# Patient Record
Sex: Female | Born: 1935
Health system: Southern US, Community
[De-identification: ages and names within clinical notes are randomized; demographics above are authoritative.]

## PROBLEM LIST (undated history)

## (undated) DIAGNOSIS — R079 Chest pain, unspecified: Secondary | ICD-10-CM

## (undated) DIAGNOSIS — Z803 Family history of malignant neoplasm of breast: Secondary | ICD-10-CM

## (undated) DIAGNOSIS — R55 Syncope and collapse: Secondary | ICD-10-CM

## (undated) DIAGNOSIS — G9389 Other specified disorders of brain: Secondary | ICD-10-CM

## (undated) DIAGNOSIS — M545 Low back pain: Secondary | ICD-10-CM

## (undated) DIAGNOSIS — R269 Unspecified abnormalities of gait and mobility: Principal | ICD-10-CM

## (undated) DIAGNOSIS — E039 Hypothyroidism, unspecified: Secondary | ICD-10-CM

## (undated) DIAGNOSIS — E559 Vitamin D deficiency, unspecified: Secondary | ICD-10-CM

## (undated) DIAGNOSIS — K439 Ventral hernia without obstruction or gangrene: Secondary | ICD-10-CM

## (undated) DIAGNOSIS — G319 Degenerative disease of nervous system, unspecified: Secondary | ICD-10-CM

## (undated) DIAGNOSIS — H9190 Unspecified hearing loss, unspecified ear: Secondary | ICD-10-CM

## (undated) DIAGNOSIS — K5900 Constipation, unspecified: Secondary | ICD-10-CM

## (undated) DIAGNOSIS — S1093XA Contusion of unspecified part of neck, initial encounter: Secondary | ICD-10-CM

## (undated) DIAGNOSIS — L84 Corns and callosities: Secondary | ICD-10-CM

## (undated) DIAGNOSIS — S0003XA Contusion of scalp, initial encounter: Secondary | ICD-10-CM

## (undated) DIAGNOSIS — E785 Hyperlipidemia, unspecified: Secondary | ICD-10-CM

## (undated) DIAGNOSIS — S8010XA Contusion of unspecified lower leg, initial encounter: Secondary | ICD-10-CM

## (undated) DIAGNOSIS — S0083XA Contusion of other part of head, initial encounter: Secondary | ICD-10-CM

## (undated) DIAGNOSIS — Z9181 History of falling: Secondary | ICD-10-CM

## (undated) DIAGNOSIS — I6789 Other cerebrovascular disease: Secondary | ICD-10-CM

## (undated) DIAGNOSIS — R350 Frequency of micturition: Secondary | ICD-10-CM

## (undated) DIAGNOSIS — N390 Urinary tract infection, site not specified: Secondary | ICD-10-CM

## (undated) DIAGNOSIS — M25569 Pain in unspecified knee: Secondary | ICD-10-CM

## (undated) DIAGNOSIS — R609 Edema, unspecified: Secondary | ICD-10-CM

## (undated) DIAGNOSIS — K429 Umbilical hernia without obstruction or gangrene: Secondary | ICD-10-CM

## (undated) DIAGNOSIS — M810 Age-related osteoporosis without current pathological fracture: Secondary | ICD-10-CM

## (undated) DIAGNOSIS — M1990 Unspecified osteoarthritis, unspecified site: Secondary | ICD-10-CM

## (undated) HISTORY — DX: Chest pain, unspecified: R07.9

## (undated) HISTORY — DX: Other specified disorders of brain: G93.89

## (undated) HISTORY — DX: Unspecified abnormalities of gait and mobility: R26.9

## (undated) HISTORY — DX: Contusion of unspecified part of neck, initial encounter: S10.93XA

## (undated) HISTORY — DX: Urinary tract infection, site not specified: N39.0

## (undated) HISTORY — DX: Ventral hernia without obstruction or gangrene: K43.9

## (undated) HISTORY — DX: Edema, unspecified: R60.9

## (undated) HISTORY — DX: Corns and callosities: L84

## (undated) HISTORY — DX: Constipation, unspecified: K59.00

## (undated) HISTORY — DX: Unspecified hearing loss, unspecified ear: H91.90

## (undated) HISTORY — DX: Contusion of other part of head, initial encounter: S00.83XA

## (undated) HISTORY — DX: Hypothyroidism, unspecified: E03.9

## (undated) HISTORY — DX: Contusion of scalp, initial encounter: S00.03XA

## (undated) HISTORY — DX: Family history of malignant neoplasm of breast: Z80.3

## (undated) HISTORY — DX: Frequency of micturition: R35.0

## (undated) HISTORY — DX: Degenerative disease of nervous system, unspecified: G31.9

## (undated) HISTORY — DX: Vitamin D deficiency, unspecified: E55.9

## (undated) HISTORY — DX: Hyperlipidemia, unspecified: E78.5

## (undated) HISTORY — DX: Low back pain: M54.5

## (undated) HISTORY — DX: Unspecified osteoarthritis, unspecified site: M19.90

## (undated) HISTORY — DX: Age-related osteoporosis without current pathological fracture: M81.0

## (undated) HISTORY — DX: Contusion of unspecified lower leg, initial encounter: S80.10XA

## (undated) HISTORY — DX: Pain in unspecified knee: M25.569

## (undated) HISTORY — DX: History of falling: Z91.81

## (undated) HISTORY — DX: Syncope and collapse: R55

## (undated) HISTORY — DX: Other cerebrovascular disease: I67.89

## (undated) HISTORY — DX: Umbilical hernia without obstruction or gangrene: K42.9

---

## 1984-01-28 HISTORY — PX: TOE AMPUTATION: SHX809

## 1997-11-07 ENCOUNTER — Emergency Department (HOSPITAL_COMMUNITY): Admission: EM | Admit: 1997-11-07 | Discharge: 1997-11-07 | Payer: Self-pay | Admitting: Emergency Medicine

## 1997-11-10 ENCOUNTER — Encounter: Admission: RE | Admit: 1997-11-10 | Discharge: 1997-11-10 | Payer: Self-pay | Admitting: Internal Medicine

## 1997-11-16 ENCOUNTER — Ambulatory Visit (HOSPITAL_COMMUNITY): Admission: RE | Admit: 1997-11-16 | Discharge: 1997-11-16 | Payer: Self-pay | Admitting: *Deleted

## 1998-03-08 ENCOUNTER — Ambulatory Visit (HOSPITAL_BASED_OUTPATIENT_CLINIC_OR_DEPARTMENT_OTHER): Admission: RE | Admit: 1998-03-08 | Discharge: 1998-03-08 | Payer: Self-pay | Admitting: Surgery

## 1998-10-19 ENCOUNTER — Other Ambulatory Visit: Admission: RE | Admit: 1998-10-19 | Discharge: 1998-10-19 | Payer: Self-pay | Admitting: Internal Medicine

## 1998-10-26 ENCOUNTER — Encounter: Payer: Self-pay | Admitting: Internal Medicine

## 1998-10-26 ENCOUNTER — Ambulatory Visit (HOSPITAL_COMMUNITY): Admission: RE | Admit: 1998-10-26 | Discharge: 1998-10-26 | Payer: Self-pay | Admitting: Internal Medicine

## 1999-11-15 ENCOUNTER — Encounter: Payer: Self-pay | Admitting: Surgery

## 1999-11-15 ENCOUNTER — Ambulatory Visit (HOSPITAL_COMMUNITY): Admission: RE | Admit: 1999-11-15 | Discharge: 1999-11-15 | Payer: Self-pay | Admitting: Surgery

## 2000-03-09 ENCOUNTER — Ambulatory Visit (HOSPITAL_BASED_OUTPATIENT_CLINIC_OR_DEPARTMENT_OTHER): Admission: RE | Admit: 2000-03-09 | Discharge: 2000-03-09 | Payer: Self-pay | Admitting: *Deleted

## 2000-03-09 ENCOUNTER — Encounter (INDEPENDENT_AMBULATORY_CARE_PROVIDER_SITE_OTHER): Payer: Self-pay | Admitting: *Deleted

## 2000-11-16 ENCOUNTER — Encounter: Payer: Self-pay | Admitting: Surgery

## 2000-11-16 ENCOUNTER — Ambulatory Visit (HOSPITAL_COMMUNITY): Admission: RE | Admit: 2000-11-16 | Discharge: 2000-11-16 | Payer: Self-pay | Admitting: Surgery

## 2001-11-17 ENCOUNTER — Encounter: Payer: Self-pay | Admitting: Surgery

## 2001-11-17 ENCOUNTER — Ambulatory Visit (HOSPITAL_COMMUNITY): Admission: RE | Admit: 2001-11-17 | Discharge: 2001-11-17 | Payer: Self-pay | Admitting: Surgery

## 2002-11-23 ENCOUNTER — Ambulatory Visit (HOSPITAL_COMMUNITY): Admission: RE | Admit: 2002-11-23 | Discharge: 2002-11-23 | Payer: Self-pay | Admitting: Surgery

## 2003-06-12 ENCOUNTER — Emergency Department (HOSPITAL_COMMUNITY): Admission: EM | Admit: 2003-06-12 | Discharge: 2003-06-12 | Payer: Self-pay | Admitting: Emergency Medicine

## 2003-06-13 ENCOUNTER — Ambulatory Visit (HOSPITAL_COMMUNITY): Admission: RE | Admit: 2003-06-13 | Discharge: 2003-06-13 | Payer: Self-pay | Admitting: Emergency Medicine

## 2003-06-17 ENCOUNTER — Emergency Department (HOSPITAL_COMMUNITY): Admission: EM | Admit: 2003-06-17 | Discharge: 2003-06-17 | Payer: Self-pay | Admitting: Emergency Medicine

## 2003-11-27 ENCOUNTER — Ambulatory Visit (HOSPITAL_COMMUNITY): Admission: RE | Admit: 2003-11-27 | Discharge: 2003-11-27 | Payer: Self-pay | Admitting: Surgery

## 2004-11-27 ENCOUNTER — Ambulatory Visit (HOSPITAL_COMMUNITY): Admission: RE | Admit: 2004-11-27 | Discharge: 2004-11-27 | Payer: Self-pay | Admitting: Surgery

## 2005-11-20 ENCOUNTER — Encounter: Admission: RE | Admit: 2005-11-20 | Discharge: 2006-02-18 | Payer: Self-pay | Admitting: Surgery

## 2005-12-01 ENCOUNTER — Ambulatory Visit (HOSPITAL_COMMUNITY): Admission: RE | Admit: 2005-12-01 | Discharge: 2005-12-01 | Payer: Self-pay | Admitting: Surgery

## 2006-01-27 HISTORY — PX: KNEE SURGERY: SHX244

## 2006-12-31 ENCOUNTER — Emergency Department (HOSPITAL_COMMUNITY): Admission: EM | Admit: 2006-12-31 | Discharge: 2006-12-31 | Payer: Self-pay | Admitting: Emergency Medicine

## 2007-02-01 LAB — HM DEXA SCAN

## 2008-01-04 ENCOUNTER — Inpatient Hospital Stay (HOSPITAL_COMMUNITY): Admission: AD | Admit: 2008-01-04 | Discharge: 2008-01-07 | Payer: Self-pay | Admitting: Surgery

## 2008-01-28 HISTORY — PX: HERNIA REPAIR: SHX51

## 2008-04-25 ENCOUNTER — Encounter (INDEPENDENT_AMBULATORY_CARE_PROVIDER_SITE_OTHER): Payer: Self-pay | Admitting: *Deleted

## 2009-06-03 ENCOUNTER — Emergency Department (HOSPITAL_COMMUNITY): Admission: EM | Admit: 2009-06-03 | Discharge: 2009-06-04 | Payer: Self-pay | Admitting: Emergency Medicine

## 2009-06-05 ENCOUNTER — Encounter: Admission: RE | Admit: 2009-06-05 | Discharge: 2009-06-05 | Payer: Self-pay | Admitting: Internal Medicine

## 2009-06-05 DIAGNOSIS — Z9181 History of falling: Secondary | ICD-10-CM | POA: Insufficient documentation

## 2010-01-27 HISTORY — PX: CATARACT EXTRACTION W/ INTRAOCULAR LENS  IMPLANT, BILATERAL: SHX1307

## 2010-06-11 NOTE — Op Note (Signed)
NAME:  Krista Bowman, Krista Bowman              ACCOUNT NO.:  [id number]   MEDICAL RECORD NO.:  [id number]          PATIENT TYPE:  AMB   LOCATION:  DAY                          FACILITY:  [facility]   PHYSICIAN:  Sandria Bales. Ezzard Standing, M.D.  DATE OF BIRTH:  May 05, 1931   DATE OF PROCEDURE:  01/04/2008  DATE OF DISCHARGE:                               OPERATIVE REPORT   Date of surgery ??   PREOPERATIVE DIAGNOSIS:  Ventral hernia.   POSTOPERATIVE DIAGNOSIS:  Ventral incisional hernia approximately 5 x 6  cm size, small bowel stuck along the inferior edge of hernia.   PROCEDURE:  Laparoscopic ventral incisional hernia repair with 12 x 12  cm Parietex mesh.   SURGEON:  Sandria Bales. Ezzard Standing, M.D.   ANESTHESIA:  General endotracheal with 20 mL of 0.25% Marcaine.   ESTIMATED BLOOD LOSS:  Minimal.   INDICATIONS FOR PROCEDURE:  Ms. Krista Bowman is a 77 year old white female who  sees Samara Snide, MD as her primary care doctor.  She has  had an increasing abdominal hernia that was becoming increasingly  symptomatic.   I repaired an umbilical hernia on her in 2000.  This appears to be a  probable recurrent ventral hernia.   The indications, potential complications of hernia repair were explained  to the patient.  Potential complications include, but not limited to,  bleeding, infection, nerve injury, recurrence of the hernia.   OPERATIVE NOTE:  The patient placed in a supine position and given a  general endotracheal anesthetic.  Her abdomen grab was prepped with  Techni-Care generic.  She had a Foley catheter in place, was given a  gram of Ancef at initiation of the procedure and I prepped her with the  [facility] generic.  I used an Ioban drape.   A time-out was held to identify the patient and procedure.   The abdomen was accessed through the left upper quadrant with a 11-mm  Ethicon OptiVu trocar  I placed a 5 mm in the left lower quadrant and 5-  mm in the right upper quadrant.  Abdominal  exploration revealed the  right and left lobes of liver unremarkable.  Anterior wall of the  stomach unremarkable.  The bowel that I could see was unremarkable.  She  did have omentum trapped in this ventral hernia and a loop of small  bowel stuck up to the inferior edge of the hernia.   The omentum reduced easily.  The bowel had to be cut away with scissors.  I avoided any use of any cautery and took out a couple of stitches that  were lying on top of where the small bowel was stuck.   I inspected the bowel.  I do not think I had any transmural injury to  the bowel.  I then measured the hernia defect which was 6 cm in width, 5  cm in height and it looked like to get a good overlap, a 12 x 12 cm of  mesh would work.   I put eight holding sutures in the Parietex mesh and inserted this  through the abdominal cavity.  I  used an Endocatch to pull these up to  the anterior abdominal wall and tied these.  I then used a stapler used  32 tacks in a circumferential pattern trying to space these 1.5 cm  apart.   The mesh lay flat.  The holding sutures I felt held the mesh well.  I  desufflated the abdomen once to make sure there was no area the bowel  could get trapped around the mesh.  I then removed my trocars in turn.  There was no bleeding at any trocar site.  Each skin site was closed  with 5-0 Vicryl.   I then used Dermabond on the skin and used an abdominal binder on her  and following this she was taken out of the operating room.  Sponge and  needle count were correct at the end of the case.      Sandria Bales. Ezzard Standing, M.D.  Electronically Signed     DHN/MEDQ  D:  01/04/2008  T:  01/04/2008  Job:  [id number]   cc:   Samara Snide, MD   Hedwig Morton. Juanda Chance, MD  [address]  [place]  [place] [address]

## 2010-06-11 NOTE — Discharge Summary (Signed)
NAME:  Krista Bowman, Krista Bowman NO.:  [id number]   MEDICAL RECORD NO.:  [id number]          PATIENT TYPE:  INP   LOCATION:  1309                         FACILITY:  [facility]   PHYSICIAN:  Sandria Bales. Ezzard Standing, M.D.  DATE OF BIRTH:  [birth year]   DATE OF ADMISSION:  01/04/2008  DATE OF DISCHARGE:  01/07/2008                               DISCHARGE SUMMARY   Date of admission and discharge ?   DISCHARGE DIAGNOSIS:  1. Ventral incisional hernia.  2. Hypothyroidism.  3. Osteoporosis.   OPERATIONS PERFORMED:  The patient had a laparoscopic ventral hernia  repair on January 04, 2008.   HISTORY OF PRESENT ILLNESS:  Krista Bowman is a 77 year old white female  patient of Dr. Chaney Bowman who had an umbilical hernia repair in 2000  by Dr. Ovidio Bowman.  However over the last 3-4 months of this year, she  had an increasing bulge in her mid abdomen consistent with an abdominal  wall hernia.   She has had no other significant GI history.  She had a colonoscopy by  Dr. Lina Bowman about 4-5 years ago.  She had a fibroid from her uterus  excised in 1980s.   PAST MEDICAL HISTORY:   ALLERGIES:  PENICILLIN, BUT SHE IS NOT SURE WHAT THIS ALLERGY WAS.   MEDICATIONS:  On admission included:  1. Synthroid 75 mg daily.  2. Fosamax.  3. Aspirin daily.   HOSPITAL COURSE:  On the day of admission, she was taken to the  operating room where she underwent a laparoscopic ventral hernia repair.  I used a 12 x 12 cm piece of Parietex.   She did well postop, had a fair amount of soreness the first day, was  still feeling kind of puny by the second day postop.  She is now 3 days postop.  Her temperature is 97.7, her pulse is 72,  blood pressure 116/65.  Her abdomen is soft.  Incisions were well-  healed.   She is now ready for discharge.   DISCHARGE INSTRUCTIONS:  1. Regular diet.  2. She can shower.  3. She will see me back in 3-4 weeks for follow-up.  She is to call      earlier with any  questions.  4. She was given Vicodin for pain.  5. She is to resume her home medicines.   DISCHARGE CONDITION:  Good.      Sandria Bales. Ezzard Standing, M.D.  Electronically Signed     DHN/MEDQ  D:  01/07/2008  T:  01/07/2008  Job:  [id number]   cc:   Samara Snide, MD   Hedwig Morton. Juanda Chance, MD  [address]  [place]  [place] [address]

## 2010-06-14 NOTE — Op Note (Signed)
[facility]  Patient:    Krista Bowman, Krista Bowman                       MRN: [id number] Proc. Date: 03/09/00 Adm. Date:  [id number] Attending:  Kendell Bane CC:         Austin Miles. Asencion Islam, M.D.   Operative Report  PREOPERATIVE DIAGNOSIS:  Dupuytrens nodule left palm.  POSTOPERATIVE DIAGNOSIS:  Dupuytrens nodule left palm.  OPERATION:  Excision of Dupuytrens nodule left palm.  SURGEON:  Lowell Bouton, M.D.  ANESTHESIA:  0.25% Marcaine local with sedation.  OPERATIVE FINDINGS:  The patient had a palmar Dupuytrens nodule in line with the middle finger on her left palm.  DESCRIPTION OF PROCEDURE:  Under 0.25% Marcaine local anesthesia with a tourniquet on the left arm, the left hand was prepped and draped in the usual fashion and after exsanguinating the limb, the tourniquet was inflated to 250 mmHg.  A V shaped incision was made in the palm over the mass.  Blunt dissection was carried through the subcutaneous tissues and sharp dissection was carried along the deep layer of the skin to elevate the flap.  The nodule was adherent to the deep layer of the skin.  After dissecting the skin away and the nodule off, the nodule was then dissected out deeply and the digital nerves were identified in the palm and protected.  The nodule and fascia surrounding it was excised completely and was adherent to the flexor sheath. After completing excising the nodule, no more could be palpated and the wound was irrigated with saline and closed with 4-0 nylon sutures.  Sterile dressings were applied.  The tourniquet was released with good circulation to the hand.  The patient went to the recovery room, awake and stable in good condition. DD:  03/09/00 TD:  03/10/00 Job: [id number]

## 2010-07-11 ENCOUNTER — Other Ambulatory Visit: Payer: Self-pay | Admitting: Internal Medicine

## 2010-07-11 ENCOUNTER — Ambulatory Visit
Admission: RE | Admit: 2010-07-11 | Discharge: 2010-07-11 | Disposition: A | Discharge status: Home / Self Care | Payer: Medicare Other | Source: Ambulatory Visit | Attending: Internal Medicine | Admitting: Internal Medicine

## 2010-07-11 DIAGNOSIS — R52 Pain, unspecified: Secondary | ICD-10-CM

## 2010-07-11 DIAGNOSIS — R609 Edema, unspecified: Secondary | ICD-10-CM

## 2010-11-01 LAB — COMPREHENSIVE METABOLIC PANEL
ALT: 14 U/L (ref 0–35)
AST: 18 U/L (ref 0–37)
Albumin: 3.6 g/dL (ref 3.5–5.2)
Alkaline Phosphatase: 50 U/L (ref 39–117)
Chloride: 109 mEq/L (ref 96–112)
GFR calc non Af Amer: 59 mL/min — ABNORMAL LOW (ref >60)
Total Bilirubin: 1 mg/dL (ref 0.3–1.2)

## 2010-11-01 LAB — DIFFERENTIAL
Basophils Absolute: 0 10*3/uL (ref 0.0–0.1)
Eosinophils Absolute: 0.1 10*3/uL (ref 0.0–0.7)
Lymphocytes Relative: 22 % (ref 12–46)
Lymphs Abs: 1.3 10*3/uL (ref 0.7–4.0)
Monocytes Relative: 5 % (ref 3–12)
Neutrophils Relative %: 70 % (ref 43–77)

## 2010-11-01 LAB — CBC
HCT: 39.1 % (ref 36.0–46.0)
Hemoglobin: 12.8 g/dL (ref 12.0–15.0)
MCHC: 32.7 g/dL (ref 30.0–36.0)
MCV: 90.2 fL (ref 78.0–100.0)
Platelets: 312 10*3/uL (ref 150–400)
RBC: 4.33 MIL/uL (ref 3.87–5.11)
RDW: 14 % (ref 11.5–15.5)

## 2010-11-04 LAB — BASIC METABOLIC PANEL
Chloride: 105
GFR calc non Af Amer: >60
Glucose, Bld: 143 — ABNORMAL HIGH
Potassium: 4
Sodium: 140

## 2010-11-04 LAB — POCT CARDIAC MARKERS
CKMB, poc: <1 — ABNORMAL LOW
Myoglobin, poc: 64.4
Troponin i, poc: <0.05
Troponin i, poc: <0.05

## 2010-11-04 LAB — CBC
HCT: 37.2
MCHC: 34
MCV: 87.5
Platelets: 326

## 2011-04-01 DIAGNOSIS — E039 Hypothyroidism, unspecified: Secondary | ICD-10-CM | POA: Diagnosis not present

## 2011-04-01 DIAGNOSIS — R350 Frequency of micturition: Secondary | ICD-10-CM | POA: Diagnosis not present

## 2011-04-01 DIAGNOSIS — E559 Vitamin D deficiency, unspecified: Secondary | ICD-10-CM | POA: Diagnosis not present

## 2011-04-01 DIAGNOSIS — M25569 Pain in unspecified knee: Secondary | ICD-10-CM | POA: Diagnosis not present

## 2011-04-01 DIAGNOSIS — E785 Hyperlipidemia, unspecified: Secondary | ICD-10-CM | POA: Diagnosis not present

## 2011-04-17 DIAGNOSIS — E039 Hypothyroidism, unspecified: Secondary | ICD-10-CM | POA: Diagnosis not present

## 2011-04-17 DIAGNOSIS — M25569 Pain in unspecified knee: Secondary | ICD-10-CM | POA: Diagnosis not present

## 2011-04-17 DIAGNOSIS — E785 Hyperlipidemia, unspecified: Secondary | ICD-10-CM | POA: Diagnosis not present

## 2011-04-17 DIAGNOSIS — R7989 Other specified abnormal findings of blood chemistry: Secondary | ICD-10-CM | POA: Diagnosis not present

## 2011-04-17 DIAGNOSIS — E559 Vitamin D deficiency, unspecified: Secondary | ICD-10-CM | POA: Diagnosis not present

## 2011-04-17 DIAGNOSIS — R279 Unspecified lack of coordination: Secondary | ICD-10-CM | POA: Diagnosis not present

## 2011-04-18 DIAGNOSIS — Z961 Presence of intraocular lens: Secondary | ICD-10-CM | POA: Diagnosis not present

## 2011-08-07 DIAGNOSIS — R609 Edema, unspecified: Secondary | ICD-10-CM | POA: Diagnosis not present

## 2011-08-07 DIAGNOSIS — L84 Corns and callosities: Secondary | ICD-10-CM | POA: Diagnosis not present

## 2011-10-21 DIAGNOSIS — R7989 Other specified abnormal findings of blood chemistry: Secondary | ICD-10-CM | POA: Diagnosis not present

## 2011-10-21 DIAGNOSIS — R609 Edema, unspecified: Secondary | ICD-10-CM | POA: Diagnosis not present

## 2011-10-21 DIAGNOSIS — E785 Hyperlipidemia, unspecified: Secondary | ICD-10-CM | POA: Diagnosis not present

## 2011-10-30 DIAGNOSIS — R609 Edema, unspecified: Secondary | ICD-10-CM | POA: Diagnosis not present

## 2011-10-30 DIAGNOSIS — M25569 Pain in unspecified knee: Secondary | ICD-10-CM | POA: Diagnosis not present

## 2011-10-30 DIAGNOSIS — E785 Hyperlipidemia, unspecified: Secondary | ICD-10-CM | POA: Diagnosis not present

## 2011-10-30 DIAGNOSIS — E039 Hypothyroidism, unspecified: Secondary | ICD-10-CM | POA: Diagnosis not present

## 2011-12-08 DIAGNOSIS — Z23 Encounter for immunization: Secondary | ICD-10-CM | POA: Diagnosis not present

## 2011-12-09 DIAGNOSIS — Z1231 Encounter for screening mammogram for malignant neoplasm of breast: Secondary | ICD-10-CM | POA: Diagnosis not present

## 2011-12-09 DIAGNOSIS — Z803 Family history of malignant neoplasm of breast: Secondary | ICD-10-CM | POA: Diagnosis not present

## 2012-01-16 DIAGNOSIS — N3000 Acute cystitis without hematuria: Secondary | ICD-10-CM | POA: Diagnosis not present

## 2012-01-16 DIAGNOSIS — R42 Dizziness and giddiness: Secondary | ICD-10-CM | POA: Diagnosis not present

## 2012-01-22 ENCOUNTER — Emergency Department (HOSPITAL_COMMUNITY)
Admission: EM | Admit: 2012-01-22 | Discharge: 2012-01-22 | Disposition: A | Discharge status: Home / Self Care | Payer: Medicare Other | Attending: Emergency Medicine | Admitting: Emergency Medicine

## 2012-01-22 ENCOUNTER — Emergency Department (HOSPITAL_COMMUNITY): Payer: Medicare Other

## 2012-01-22 ENCOUNTER — Encounter (HOSPITAL_COMMUNITY): Payer: Self-pay | Admitting: Emergency Medicine

## 2012-01-22 DIAGNOSIS — Z79899 Other long term (current) drug therapy: Secondary | ICD-10-CM | POA: Diagnosis not present

## 2012-01-22 DIAGNOSIS — R6889 Other general symptoms and signs: Secondary | ICD-10-CM | POA: Diagnosis not present

## 2012-01-22 DIAGNOSIS — R404 Transient alteration of awareness: Secondary | ICD-10-CM | POA: Diagnosis not present

## 2012-01-22 DIAGNOSIS — R269 Unspecified abnormalities of gait and mobility: Secondary | ICD-10-CM

## 2012-01-22 DIAGNOSIS — E039 Hypothyroidism, unspecified: Secondary | ICD-10-CM | POA: Diagnosis not present

## 2012-01-22 DIAGNOSIS — R5383 Other fatigue: Secondary | ICD-10-CM | POA: Diagnosis not present

## 2012-01-22 DIAGNOSIS — R42 Dizziness and giddiness: Secondary | ICD-10-CM | POA: Diagnosis not present

## 2012-01-22 DIAGNOSIS — S0990XA Unspecified injury of head, initial encounter: Secondary | ICD-10-CM | POA: Diagnosis not present

## 2012-01-22 LAB — URINALYSIS, ROUTINE W REFLEX MICROSCOPIC
Bilirubin Urine: NEGATIVE
Ketones, ur: NEGATIVE mg/dL
Specific Gravity, Urine: 1.013 (ref 1.005–1.030)

## 2012-01-22 LAB — BASIC METABOLIC PANEL
BUN: 17 mg/dL (ref 6–23)
CO2: 26 mEq/L (ref 19–32)
Calcium: 10.2 mg/dL (ref 8.4–10.5)
Creatinine, Ser: 0.9 mg/dL (ref 0.50–1.10)
Glucose, Bld: 94 mg/dL (ref 70–99)

## 2012-01-22 LAB — URINE MICROSCOPIC-ADD ON

## 2012-01-22 LAB — CBC
HCT: 37.1 % (ref 36.0–46.0)
MCH: 29.4 pg (ref 26.0–34.0)
MCV: 87.9 fL (ref 78.0–100.0)
Platelets: 305 10*3/uL (ref 150–400)
WBC: 7.4 10*3/uL (ref 4.0–10.5)

## 2012-01-22 MED ORDER — SODIUM CHLORIDE 0.9 % IV SOLN
Freq: Once | INTRAVENOUS | Status: AC
  Administered 2012-01-22: 12:00:00 via INTRAVENOUS

## 2012-01-22 NOTE — ED Notes (Signed)
PER EMS- [facility] with c/o unsteady gait and tenderness L side neck.  Reports that x2 weeks pt started on cipro for cystitis. Family wants pt checked out for further evaluation.    Arrives to ED alert and oriented, NAD.  20g IV placed by EMS.

## 2012-01-22 NOTE — ED Notes (Signed)
Pt away for MRI

## 2012-01-22 NOTE — ED Notes (Signed)
Bed:WA05<BR> Expected date:<BR> Expected time:<BR> Means of arrival:<BR> Comments:<BR> ems

## 2012-01-22 NOTE — ED Notes (Signed)
Patient transported to CT 

## 2012-01-22 NOTE — ED Provider Notes (Signed)
History     CSN: [id number]  Arrival date & time 01/22/12  1106   First MD Initiated Contact with Patient 01/22/12 1107      Chief Complaint  Patient presents with  . Gait Problem     The history is provided by the patient.   patient reports she's been little bit more unsteady in her gait over the past 24 hours.  She has not had headache.  She denies unilateral weakness of her upper lower extremities.  No facial asymmetry or difficulty with her speech.  No prior history of stroke.  The patient only has a history of hypothyroidism for which is on Synthroid.  No fevers or chills.  No melena or hematochezia.  No chest pain or shortness of breath.  No abdominal pain.  No other complaints.  The patient spoke with the nurses at her facility and they recommended she come to the emergency department for evaluation for the possibility of a stroke.  History reviewed. No pertinent past medical history.  Past Surgical History  Procedure Date  . Knee surgery     No family history on file.  History  Substance Use Topics  . Smoking status: Never Smoker   . Smokeless tobacco: Not on file  . Alcohol Use: No    OB History    Grav Para Term Preterm Abortions TAB SAB Ect Mult Living                  Review of Systems  All other systems reviewed and are negative.    Allergies  Penicillins  Home Medications  No current outpatient prescriptions on file.  BP 124/87  Temp 97.8 F (36.6 C) (Oral)  Resp 16  SpO2 100%  Physical Exam  Nursing note and vitals reviewed. Constitutional: She is oriented to person, place, and time. She appears well-developed and well-nourished. No distress.  HENT:  Head: Normocephalic and atraumatic.  Eyes: EOM are normal. Pupils are equal, round, and reactive to light.  Neck: Normal range of motion.  Cardiovascular: Normal rate, regular rhythm and normal heart sounds.   Pulmonary/Chest: Effort normal and breath sounds normal.  Abdominal: Soft. She  exhibits no distension. There is no tenderness.  Musculoskeletal: Normal range of motion.  Neurological: She is alert and oriented to person, place, and time.       5/5 strength in major muscle groups of  bilateral upper and lower extremities. Speech normal. No facial asymetry.   Skin: Skin is warm and dry.  Psychiatric: She has a normal mood and affect. Judgment normal.    ED Course  Procedures (including critical care time)   Labs Reviewed  CBC  BASIC METABOLIC PANEL  URINALYSIS, ROUTINE W REFLEX MICROSCOPIC   Ct Head Wo Contrast  01/22/2012  *RADIOLOGY REPORT*  Clinical Data: Fall.  Unsteady gait.  CT HEAD WITHOUT CONTRAST  Technique:  Contiguous axial images were obtained from the base of the skull through the vertex without contrast.  Comparison: 06/05/2009  Findings: Chronic ischemic changes in the periventricular white matter are not significantly changed.  Mastoid air cells and visualized paranasal sinuses are clear.  Nasal septum is deviated to the left.  Cranium is intact.  IMPRESSION: No acute intracranial pathology.   Original Report Authenticated By: Jolaine Click, M.D.    Mr Brain Wo Contrast  01/22/2012  *RADIOLOGY REPORT*  Clinical Data: Unsteady gait.  Dizziness.  Left-sided weakness. Fell 3 days ago.  MRI HEAD WITHOUT CONTRAST  Technique:  Multiplanar, multiecho  pulse sequences of the brain and surrounding structures were obtained according to standard protocol without intravenous contrast.  Comparison: 01/22/2012 and 06/03/2009 CT.  06/05/2009 MR.  Findings: Tiny area of altered signal within the right pons (series 4 image 9) probably artifact rather than acute infarct.  Overall, no acute infarct noted.  No intracranial hemorrhage.  Small vessel disease type changes.  Mild global atrophy without hydrocephalus.  Left frontal 1.2 cm calvarial lesion is stable since 2011.  Major intracranial vascular structures are patent.  Cerebellar tonsils minimally low-lying but within the  range normal limits.  C3-4 mild bulge.  The pituitary region, pineal region and orbital structures unremarkable.  IMPRESSION: No acute abnormality.  Please see above.   Original Report Authenticated By: Lacy Duverney, M.D.    I personally reviewed the imaging tests through PACS system I reviewed available ER/hospitalization records through the EMR   1. Gait abnormality       MDM  3:37 PM The patient feels much better at this time.  She is able to light at the bedside.  CT head without acute pathology MRI scan demonstrates no evidence of acute stroke.  Discharge home in good condition with close PCP followup.  The patient understands return the ER for new or worsening symptoms.  This sounds like more peripheral vertigo issue        Lyanne Co, MD 01/22/12 1539

## 2012-01-22 NOTE — ED Notes (Signed)
MD at bedside. 

## 2012-01-29 DIAGNOSIS — E039 Hypothyroidism, unspecified: Secondary | ICD-10-CM | POA: Diagnosis not present

## 2012-01-29 DIAGNOSIS — R269 Unspecified abnormalities of gait and mobility: Secondary | ICD-10-CM | POA: Diagnosis not present

## 2012-02-03 DIAGNOSIS — M6281 Muscle weakness (generalized): Secondary | ICD-10-CM | POA: Diagnosis not present

## 2012-02-03 DIAGNOSIS — R279 Unspecified lack of coordination: Secondary | ICD-10-CM | POA: Diagnosis not present

## 2012-02-03 DIAGNOSIS — Z9181 History of falling: Secondary | ICD-10-CM | POA: Diagnosis not present

## 2012-02-19 DIAGNOSIS — E785 Hyperlipidemia, unspecified: Secondary | ICD-10-CM | POA: Diagnosis not present

## 2012-02-19 DIAGNOSIS — R1314 Dysphagia, pharyngoesophageal phase: Secondary | ICD-10-CM | POA: Diagnosis not present

## 2012-02-19 DIAGNOSIS — E039 Hypothyroidism, unspecified: Secondary | ICD-10-CM | POA: Diagnosis not present

## 2012-02-19 DIAGNOSIS — N390 Urinary tract infection, site not specified: Secondary | ICD-10-CM | POA: Diagnosis not present

## 2012-03-23 DIAGNOSIS — H0019 Chalazion unspecified eye, unspecified eyelid: Secondary | ICD-10-CM | POA: Diagnosis not present

## 2012-04-06 DIAGNOSIS — H01029 Squamous blepharitis unspecified eye, unspecified eyelid: Secondary | ICD-10-CM | POA: Diagnosis not present

## 2012-04-30 DIAGNOSIS — H35379 Puckering of macula, unspecified eye: Secondary | ICD-10-CM | POA: Diagnosis not present

## 2012-06-03 DIAGNOSIS — E785 Hyperlipidemia, unspecified: Secondary | ICD-10-CM | POA: Diagnosis not present

## 2012-06-03 DIAGNOSIS — E039 Hypothyroidism, unspecified: Secondary | ICD-10-CM | POA: Diagnosis not present

## 2012-06-03 DIAGNOSIS — Z79899 Other long term (current) drug therapy: Secondary | ICD-10-CM | POA: Diagnosis not present

## 2012-06-03 LAB — BASIC METABOLIC PANEL
Creatinine: 1.1 mg/dL (ref 0.5–1.1)
Potassium: 4.9 mmol/L (ref 3.4–5.3)

## 2012-06-03 LAB — HEPATIC FUNCTION PANEL
ALT: 12 U/L (ref 7–35)
Alkaline Phosphatase: 52 U/L (ref 25–125)

## 2012-06-10 ENCOUNTER — Non-Acute Institutional Stay: Payer: Medicare Other | Admitting: Internal Medicine

## 2012-06-10 VITALS — BP 136/64 | HR 62 | Ht 61.0 in | Wt 150.0 lb

## 2012-06-10 DIAGNOSIS — E785 Hyperlipidemia, unspecified: Secondary | ICD-10-CM

## 2012-06-10 DIAGNOSIS — R269 Unspecified abnormalities of gait and mobility: Secondary | ICD-10-CM

## 2012-06-10 DIAGNOSIS — Z9181 History of falling: Secondary | ICD-10-CM

## 2012-06-10 DIAGNOSIS — E039 Hypothyroidism, unspecified: Secondary | ICD-10-CM

## 2012-06-10 DIAGNOSIS — R609 Edema, unspecified: Secondary | ICD-10-CM | POA: Diagnosis not present

## 2012-06-10 NOTE — Progress Notes (Signed)
Date: 06/10/2012  MRN:  [id number] Name:  Claudina Oliphant Sex:  female Age:  79 y.o. DOB:[birth year]   [facility] #:     [id number]                  Facility/Room; [facility] Level Of Care: Independent Provider: A. Fremon Zacharia  Emergency Contacts: Contact Information   Name Relation Home Work Mobile   Sitter,Douglas Spouse [phone]     Kadasia, Kassing   [phone]      Code Status: living will  Allergies: Allergies  Allergen Reactions  . Mushroom Extract Complex   . Penicillins Diarrhea     Chief Complaint  Patient presents with  . Annual Exam  . Medical Managment of Chronic Issues    thyroid, cholesterol, dysphagia     HPI: Had UTI around Christmas.  After Christmas, she did not feel well. She had MRI when she went to ER. Showed cerebral atrophy and SVD, but no acute CVA.   Past Medical History  Diagnosis Date  . Urinary tract infection, site not specified 01/29/2012  . Abnormality of gait 01/29/2012  . Contusion of face, scalp, and neck except eye(s) 01/29/2012  . Contusion of lower leg 01/29/2012  . Chest pain, unspecified 10/30/2011  . Dysphagia, pharyngoesophageal phase 10/30/2011  . Corns and callosities 08/07/2011  . Pain in joint, lower leg 10/17/2010  . Unspecified hearing loss 04/18/2010  . Edema 10/18/2009  . Other conditions of brain 06/05/2009  . Personal history of fall 06/05/2009  . Unspecified vitamin D deficiency 08/24/2008  . Lumbago 05/18/2008  . Family history of malignant neoplasm of breast 2010  . Ventral hernia, unspecified, without mention of obstruction or gangrene 10/08/2007  . Other and unspecified hyperlipidemia 04/01/2007  . Umbilical hernia without mention of obstruction or gangrene 12/15/2006  . Unspecified constipation 12/15/2006  . Osteoarthrosis, unspecified whether generalized or localized, unspecified site 12/15/2006  . Osteoporosis, unspecified 12/15/2006  . Syncope and collapse 12/15/2006  . Urinary frequency 12/15/2006  . Unspecified hypothyroidism  12/11/2006    Past Surgical History  Procedure Laterality Date  . Knee surgery  2008    Dr. Berneice Heinrich  left  . Toe amputation  1986    little toe right foot  Dr. Cliffton Asters; [place]  . Hernia repair  01/2008    laparoscopic Dr. Ovidio Kin  . Cataract extraction w/ intraocular lens  implant, bilateral  2012    Dr. Hazle Quant     Procedures: 06/13/2003 Orthopantogram no evidence of acute bony injury in the mandible. 06/13/2003 CT Head no evidence of acute intracranial pathology 12/31/2006 Chest x-ray probable COPD with granuloma in right upper lobe. No active lung disease. 02/01/2007 Mammogram negative  02/01/2007 Bone Density Osteoporosis 11/19/2007 CT Abdomen/pelvis splenic granulomas anterior abdominal wall hernia most likely at the level of the umbilicus. Bowel loops are present within the hernia, but here is no evidence of bowel obstruction at this juncture. 07/03/2008 Mammogram negative 06/03/2009 CT Head  -negative for intracranial hemorrhage. Left frontal bone lesion may represent metastatic disease. No other lesions are identified. 06/03/2009 Left knee x-ray mild medial and patellofemoral osteoarthritis. No acute osseous abnormality. 06/03/2009 CT Maxillofacial negative for facial fracture 06/05/2009-MRI Head: Mild cerebellar ectopia. No acute infarct. Small vessel disease type changes. 07/04/2009 Mammogram negative 07/11/2010 X-ray Right Foot: No acute fracture. Prior amputation of the right fifth toe. 07/11/2010 X-ray Right Tibia and fibula: No acute abnormality. 07/11/2010 X-ray Right Ankle: No acute abnormality. Mild degenerative change.   11/06/2010  Mammogram negative  01/22/2012  CT Head no acute intracranial pathology  01/22/2012 MRI Brain no acute abnormality   Consultants: Ophthalmology: Dr. Hazle Quant Orthopedics: Dr. Jerl Santos Dermatology Dr. [name] Swaz[place] GI Dr. Lina Sar Surgeon Dr. Ovidio Kin Hand Surgeon Dr. Metro Kung    Current Outpatient Prescriptions  Medication Sig Dispense  Refill  . acetaminophen (TYLENOL) 650 MG CR tablet Take 650 mg by mouth every 8 (eight) hours as needed. pain      . aspirin 81 MG tablet Take 81 mg by mouth daily.      . Calcium Carbonate-Vitamin D (CALCIUM 600+D) 600-200 MG-UNIT TABS Take 1 tablet by mouth 2 (two) times daily.      . cholecalciferol (VITAMIN D) 1000 UNITS tablet Take 1,000 Units by mouth daily.      Marland Kitchen levothyroxine (SYNTHROID, LEVOTHROID) 50 MCG tablet Take 50 mcg by mouth every other day. Alternates with      . levothyroxine (SYNTHROID, LEVOTHROID) 75 MCG tablet Take 75 mcg by mouth every other day. Alternates with      . Multiple Vitamin (MULTIVITAMIN WITH MINERALS) TABS Take 1 tablet by mouth daily.      . Omega-3 Fatty Acids (FISH OIL) 1200 MG CAPS Take 1 capsule by mouth daily.      . polyethylene glycol (MIRALAX / GLYCOLAX) packet Take 17 g by mouth daily.      . vitamin B-12 (CYANOCOBALAMIN) 100 MCG tablet Take 500 mcg by mouth daily.       . vitamin C (ASCORBIC ACID) 500 MG tablet Take 500 mg by mouth daily.       No current facility-administered medications for this visit.    Immunization History  Administered Date(s) Administered  . Influenza Whole 10/28/2011  . Pneumococcal Polysaccharide 02/08/2007  . Td 01/28/1991     Diet: regular  History  Substance Use Topics  . Smoking status: Never Smoker   . Smokeless tobacco: Never Used  . Alcohol Use: No    History reviewed. No pertinent family history.   Constitutional: negative Eyes: positive for contacts/glasses, negative for visual disturbance Ears, nose, mouth, throat, and face: positive for hearing loss, negative for ear drainage, earaches, nasal congestion and voice change Respiratory: negative Cardiovascular: negative Gastrointestinal: negative Genitourinary:positive for frequency and urinary incontinence, negative for dysuria and hematuria Integument/breast: negative Hematologic/lymphatic: negative Musculoskeletal:positive for  myalgias and stiff joints, negative for bone pain and neck pain Neurological: negative Behavioral/Psych: negative Endocrine: negative  Vital signs: BP 136/64  Pulse 62  Ht 5\' 1"  (1.549 m)  Wt 150 lb (68.04 kg)  BMI 28.36 kg/m2  General Appearance:    Alert, cooperative, no distress, appears stated age  Head:    Normocephalic, without obvious abnormality, atraumatic  Eyes:    PERRL, conjunctiva/corneas clear, EOM's intact, fundi    benign, both eyes. RX lenses.  Ears:    Normal TM's and external ear canals, both ears. Bilateral hearing aids. Severe hearing deficit in both ears.  Nose:   Nares normal, septum midline, mucosa normal, no drainage    or sinus tenderness  Throat:   Lips, mucosa, and tongue normal; teeth and gums normal  Neck:   Supple, symmetrical, trachea midline, no adenopathy;    thyroid:  no enlargement/tenderness/nodules; no carotid   bruit or JVD  Back:     Symmetric, no curvature, ROM normal, no CVA tenderness  Lungs:     Clear to auscultation bilaterally, respirations unlabored  Chest Wall:    No tenderness or deformity   Heart:  Regular rate and rhythm, S1 and S2 normal, no murmur, rub   or gallop  Breast Exam:    No tenderness, masses, or nipple abnormality  Abdomen:     Soft, non-tender, bowel sounds active all four quadrants,    no masses, no organomegaly. Umbilical hernia.  Genitalia:    atrophicfemale without lesion, discharge or tenderness  Rectal:    Normal tone, normal prostate, no masses or tenderness;   guaiac negative stool  Extremities:   Extremities normal, atraumatic, no cyanosis or edema.. Mild varicosities. Right 5th toe has been amputed.  Pulses:   2+ and symmetric all extremities  Skin:   Skin color, texture, turgor normal, no rashes or lesions  Lymph nodes:   Cervical, supraclavicular, and axillary nodes normal  Neurologic:   CNII-XII intact, normal strength, sensation and reflexes    throughout     Screening Score  MMS 30  PHQ2     PHQ9     Fall Risk    BIMS     Lab reports 10/26/2008 TSH: 0.138 1/25/2011Vitamin D: 28.3 TSH: 11.500 CMP: Glucose 92, BUN 18, Creatinine 1.1 Lipid: Cholesterol 239, Triglycerides 141, HDL 63, VLDL28, LDL 146 05/24/2009 TSH 1.710 CMP Glucose 88 Bun 22 Creatinine 1.0 Lipid Panel Cholesterol 228 Triglycerides 173 Hdl 64 Ldl 127  10/11/2009 Vitamin D: 34.0 TSH: 1.790 BMP: Glucose 95, BUN 20, Creatinine 0.9 Lipid: Cholesterol 234, Triglycerides 139, HDL 81, VLDL 28, LDL 126  5/64/3329 CMP: Glucose 98, BUN 18, Creatinine 1.06 Lipid: Cholesterol 228, Triglycerides 167, HDL 56, LDL 139 07/11/2010 BMP: glucose 106, BUN 20, Creatinine 1.05 10/03/2010 CBC: WBC 5.7, RBC 4.30, 12.8 CMP: Glucose 94, BUN 19, Creatinine 1.05 Lipid: Cholesterol 236, Triglycerides 180, HDL 52, VLDL 4.5,  04/11/2011 CMP: glucose 90, BUN 22, Creatinine 1.00 Lipid: cholesterol 230, triglyceride 144, HDL 59, LDL 142 CBC: Rbc 4.41, Hgb 13.2, Hct 39.5 TSH 2.541  10/21/2011 BMP: glucose 90, BUN 17, Creatinine 1.04 Lipid: cholesterol 247, triglyceride 215, HDL 57, LDL 147 01/22/2012 [facility] CBC: Wbc 7.4, Rbc 4.22, Hgb 12.4, Hct 37.1, Platelet 305                                                                                                                                                                  Sodium 138, Potassium 5.5, glucose 94, BUN 17, Creatinine 0.90 (Hemolysis) Urine trace Hgb, epith rare, Wbc 0-2, Rbc 0-2  06/03/12 TSH 6.481   CMP: normal   Lipids:; TC 237, trig 189, HDL 57, LDL 142  06/10/12 EKG: rate 59. NSR.normal   Annual summary: Hospitalizations: none Infection History: Dec 2013 UTI Functional assessment: independent in all ADL. Areas of potential improvement: functioning at highest achievable level Rehabilitation Potential:  functioning at highest achievable level Prognosis for survival:  good  Plan: 1. Abnormality of gait iimproved  2. Edema resolved  3. Personal history of  fall None recently  4. Other and unspecified hyperlipidemia Adequate control for 79 year old female without other risk factors  5. Unspecified hypothyroidism TSH has risen. She says she is taking meds regularly. Repeat in 6 mo.

## 2012-06-10 NOTE — Progress Notes (Signed)
Passed clock drawing 

## 2012-06-10 NOTE — Patient Instructions (Signed)
Continue current meds 

## 2012-06-25 ENCOUNTER — Encounter: Payer: Self-pay | Admitting: Internal Medicine

## 2012-11-10 DIAGNOSIS — Z23 Encounter for immunization: Secondary | ICD-10-CM | POA: Diagnosis not present

## 2012-11-23 DIAGNOSIS — H01029 Squamous blepharitis unspecified eye, unspecified eyelid: Secondary | ICD-10-CM | POA: Diagnosis not present

## 2012-11-30 DIAGNOSIS — E039 Hypothyroidism, unspecified: Secondary | ICD-10-CM | POA: Diagnosis not present

## 2012-11-30 DIAGNOSIS — E785 Hyperlipidemia, unspecified: Secondary | ICD-10-CM | POA: Diagnosis not present

## 2012-11-30 LAB — LIPID PANEL
LDL Cholesterol: 144 mg/dL
Triglycerides: 186 mg/dL — AB (ref 40–160)

## 2012-12-09 ENCOUNTER — Non-Acute Institutional Stay: Payer: Medicare Other | Admitting: Internal Medicine

## 2012-12-09 ENCOUNTER — Encounter: Payer: Self-pay | Admitting: Internal Medicine

## 2012-12-09 VITALS — BP 104/64 | HR 72 | Ht 61.0 in | Wt 153.0 lb

## 2012-12-09 DIAGNOSIS — R609 Edema, unspecified: Secondary | ICD-10-CM

## 2012-12-09 DIAGNOSIS — M79609 Pain in unspecified limb: Secondary | ICD-10-CM

## 2012-12-09 DIAGNOSIS — R269 Unspecified abnormalities of gait and mobility: Secondary | ICD-10-CM | POA: Diagnosis not present

## 2012-12-09 DIAGNOSIS — E039 Hypothyroidism, unspecified: Secondary | ICD-10-CM

## 2012-12-09 DIAGNOSIS — M79604 Pain in right leg: Secondary | ICD-10-CM

## 2012-12-09 DIAGNOSIS — E785 Hyperlipidemia, unspecified: Secondary | ICD-10-CM

## 2012-12-09 NOTE — Progress Notes (Signed)
Subjective:    Patient ID: Krista Bowman, female    DOB: [birth year], 79 y.o.   MRN: [id number]  Chief Complaint  Patient presents with  . Medical Managment of Chronic Issues    thyroid, gait, cholesterol    HPI Overall, this patient is doing well and she has few complaints.  She had a left eye infection in October 2014. This was treated with doxycycline and Tobradex ophthalmic drops. She complains of chronic dry eyes. Her eye is doing better at this time.  Unspecified hypothyroidism: Controlled  Abnormality of gait: Chronic and unchanged  Edema: Improved  Other and unspecified hyperlipidemia: Modest elevation in LDL and total cholesterol  Leg pain, right: Complains of pain in the inside of the upper right leg with certain movements. There is no mass or lumps palpable. She's never had a clot in this leg. Discomfort is in the medial side but does not involve the groin. Pain does seem to come and go.    Current Outpatient Prescriptions on File Prior to Visit  Medication Sig Dispense Refill  . acetaminophen (TYLENOL) 650 MG CR tablet Take 650 mg by mouth every 8 (eight) hours as needed. pain      . aspirin 81 MG tablet Take 81 mg by mouth daily.      . Calcium Carbonate-Vitamin D (CALCIUM 600+D) 600-200 MG-UNIT TABS Take 1 tablet by mouth 2 (two) times daily.      . cholecalciferol (VITAMIN D) 1000 UNITS tablet Take 1,000 Units by mouth daily.      Marland Kitchen levothyroxine (SYNTHROID, LEVOTHROID) 50 MCG tablet Take 50 mcg by mouth every other day. Alternates with      . levothyroxine (SYNTHROID, LEVOTHROID) 75 MCG tablet Take 75 mcg by mouth every other day. Alternates with      . Multiple Vitamin (MULTIVITAMIN WITH MINERALS) TABS Take 1 tablet by mouth daily.      . Omega-3 Fatty Acids (FISH OIL) 1200 MG CAPS Take 1 capsule by mouth daily.      . polyethylene glycol (MIRALAX / GLYCOLAX) packet Take 17 g by mouth daily.      . vitamin B-12 (CYANOCOBALAMIN) 100 MCG tablet Take  500 mcg by mouth daily.       . vitamin C (ASCORBIC ACID) 500 MG tablet Take 500 mg by mouth daily.       No current facility-administered medications on file prior to visit.    Review of Systems  Constitutional: Negative for fever, chills, diaphoresis, activity change, appetite change, fatigue and unexpected weight change.  HENT: Positive for hearing loss. Negative for congestion, ear pain, sore throat and tinnitus.   Eyes:       History of xerophthalmia.  Respiratory: Negative.  Negative for cough, chest tightness and shortness of breath.   Cardiovascular: Negative for chest pain, palpitations and leg swelling.  Gastrointestinal: Negative for nausea, vomiting, abdominal pain, diarrhea and abdominal distention.  Endocrine: Negative for cold intolerance, heat intolerance, polydipsia, polyphagia and polyuria.  Genitourinary: Positive for frequency.       Nocturia x2. Recurrent urinary leakage of small quantities of urine.  Musculoskeletal: Positive for back pain. Negative for arthralgias, gait problem, myalgias and neck pain.  Skin: Negative.   Allergic/Immunologic: Negative.   Neurological: Positive for dizziness. Negative for tremors, syncope, facial asymmetry, weakness, light-headedness and headaches.  Hematological: Negative for adenopathy. Does not bruise/bleed easily.  Psychiatric/Behavioral: Negative for behavioral problems, confusion, sleep disturbance and agitation. The patient is not nervous/anxious.  Objective:BP 104/64  Pulse 72  Ht 5\' 1"  (1.549 m)  Wt 153 lb (69.4 kg)  BMI 28.92 kg/m2    Physical Exam  Constitutional: She is oriented to person, place, and time. She appears well-developed and well-nourished. No distress.  HENT:  Head: Normocephalic and atraumatic.  Right Ear: External ear normal.  Left Ear: External ear normal.  Nose: Nose normal.  Mouth/Throat: Oropharynx is clear and moist.   Bilateral hearing loss  Eyes: Conjunctivae and EOM are normal.  Pupils are equal, round, and reactive to light.  Corrective lenses  Neck: No JVD present. No tracheal deviation present. No thyromegaly present.  Cardiovascular: Normal rate, regular rhythm, normal heart sounds and intact distal pulses.  Exam reveals no friction rub.   No murmur heard. No varicosities bilaterally. Trace edema.  Pulmonary/Chest: No respiratory distress. She has no wheezes. She has no rales.  Abdominal: She exhibits no distension and no mass. There is no tenderness.  Reducible. Umbilical hernia.  Musculoskeletal: Normal range of motion. She exhibits no edema and no tenderness.  Bilateral knee scars from previous arthroscopic. Some crepitance in knees bilaterally.  Lymphadenopathy:    She has no cervical adenopathy.  Neurological: She is alert and oriented to person, place, and time. She has normal reflexes. No cranial nerve deficit. Coordination normal.  Skin: No rash noted. No erythema. No pallor.  Psychiatric: She has a normal mood and affect. Her behavior is normal. Judgment and thought content normal.      Abstract on 12/09/2012  Component Date Value Range Status  . HM Mammogram 11/06/2010 negative   Final  . HM Dexa Scan 02/01/2007 osteoporosis   Final  . Triglycerides 11/30/2012 186* 40 - 160 mg/dL Final  . Cholesterol 16/10/9602 243* 0 - 200 mg/dL Final  . HDL 54/09/8117 62  35 - 70 mg/dL Final  . LDL Cholesterol 11/30/2012 144   Final  . TSH 11/30/2012 6.49* 0.41 - 5.90 uIU/mL Final        Assessment & Plan:  1. Unspecified hypothyroidism Continue current medications  2. Abnormality of gait Unchanged  3. Edema Improved  4. Other and unspecified hyperlipidemia Modest elevation in LDL. Has not been treated. Continue to monitor fats in diet.  5. Leg pain, right Unable to locate any focal spots of pain. No mass. We'll continue to observe. No additional tests ordered.

## 2012-12-14 DIAGNOSIS — M79604 Pain in right leg: Secondary | ICD-10-CM | POA: Insufficient documentation

## 2012-12-14 NOTE — Patient Instructions (Signed)
Continue current medications. 

## 2012-12-15 DIAGNOSIS — Z1231 Encounter for screening mammogram for malignant neoplasm of breast: Secondary | ICD-10-CM | POA: Diagnosis not present

## 2012-12-15 DIAGNOSIS — Z803 Family history of malignant neoplasm of breast: Secondary | ICD-10-CM | POA: Diagnosis not present

## 2012-12-27 DIAGNOSIS — N6489 Other specified disorders of breast: Secondary | ICD-10-CM | POA: Diagnosis not present

## 2012-12-30 ENCOUNTER — Encounter: Payer: Self-pay | Admitting: Internal Medicine

## 2013-01-11 ENCOUNTER — Encounter: Payer: Self-pay | Admitting: Internal Medicine

## 2013-01-13 ENCOUNTER — Encounter: Payer: Self-pay | Admitting: Nurse Practitioner

## 2013-01-13 ENCOUNTER — Non-Acute Institutional Stay: Payer: Medicare Other | Admitting: Nurse Practitioner

## 2013-01-13 VITALS — BP 130/72 | HR 60 | Ht 61.0 in | Wt 154.0 lb

## 2013-01-13 DIAGNOSIS — E039 Hypothyroidism, unspecified: Secondary | ICD-10-CM | POA: Diagnosis not present

## 2013-01-13 DIAGNOSIS — K5900 Constipation, unspecified: Secondary | ICD-10-CM | POA: Insufficient documentation

## 2013-01-13 DIAGNOSIS — M79604 Pain in right leg: Secondary | ICD-10-CM

## 2013-01-13 DIAGNOSIS — M79609 Pain in unspecified limb: Secondary | ICD-10-CM

## 2013-01-13 NOTE — Assessment & Plan Note (Signed)
The upper right leg--positional-mainly in muscle--will have PT to evaluate and treat as indicated. Celebrex 200mg  po daily for 10 days. May consider X-ray to evaluate further if no better.

## 2013-01-13 NOTE — Assessment & Plan Note (Signed)
TSH 6.49--increase Levothyroxine to po daily, update TSH in 3 months

## 2013-01-13 NOTE — Assessment & Plan Note (Signed)
Stable on Miralax daily prn

## 2013-01-13 NOTE — Progress Notes (Signed)
Patient ID: Krista Bowman, female   DOB: 08-Feb-1931, 79 y.o.   MRN: [id number]   Code Status: Full Code  Allergies  Allergen Reactions  . Mushroom Extract Complex   . Penicillins Diarrhea    Chief Complaint  Patient presents with  . Leg Pain    upper right leg,  Spoke with Dr. Chilton Si about it 12/09/12    HPI: Patient is a 79 y.o. female seen in the clinic at [facility] today for the right inner thigh pain and other chronic medical conditions.  Problem List Items Addressed This Visit   Unspecified hypothyroidism - Primary     TSH 6.49--increase Levothyroxine to po daily, update TSH in 3 months    Leg pain     The upper right leg--positional-mainly in muscle--will have PT to evaluate and treat as indicated. Celebrex 200mg  po daily for 10 days. May consider X-ray to evaluate further if no better.     Unspecified constipation     Stable on Miralax daily prn       Review of Systems:  Review of Systems  Constitutional: Negative for fever, chills, weight loss, malaise/fatigue and diaphoresis.  HENT: Positive for hearing loss. Negative for congestion, ear discharge, ear pain, nosebleeds, sore throat and tinnitus.        Mild  Eyes: Negative for blurred vision, double vision, photophobia, pain, discharge and redness.  Respiratory: Negative for cough, hemoptysis, sputum production, shortness of breath, wheezing and stridor.   Cardiovascular: Negative for chest pain, palpitations, orthopnea, claudication, leg swelling and PND.  Gastrointestinal: Negative for heartburn, nausea, vomiting, abdominal pain, diarrhea, constipation, blood in stool and melena.  Genitourinary: Negative for dysuria, urgency, frequency, hematuria and flank pain.  Musculoskeletal: Positive for myalgias. Negative for back pain, falls, joint pain and neck pain.       Left inner thigh recent onset w/o injury  Skin: Negative for itching and rash.  Neurological: Negative for dizziness, tingling,  tremors, sensory change, speech change, focal weakness, seizures, loss of consciousness, weakness and headaches.  Endo/Heme/Allergies: Negative for environmental allergies and polydipsia. Does not bruise/bleed easily.  Psychiatric/Behavioral: Positive for depression. Negative for suicidal ideas, hallucinations, memory loss and substance abuse. The patient is not nervous/anxious and does not have insomnia.        Well controlled.      Past Medical History  Diagnosis Date  . Urinary tract infection, site not specified 01/29/2012  . Abnormality of gait 01/29/2012  . Contusion of face, scalp, and neck except eye(s) 01/29/2012  . Contusion of lower leg 01/29/2012  . Chest pain, unspecified 10/30/2011  . Dysphagia, pharyngoesophageal phase 10/30/2011  . Corns and callosities 08/07/2011  . Pain in joint, lower leg 10/17/2010  . Unspecified hearing loss 04/18/2010  . Edema 10/18/2009  . Other conditions of brain(348.89) 06/05/2009  . Personal history of fall 06/05/2009  . Unspecified vitamin D deficiency 08/24/2008  . Lumbago 05/18/2008  . Family history of malignant neoplasm of breast 2010  . Ventral hernia, unspecified, without mention of obstruction or gangrene 10/08/2007  . Other and unspecified hyperlipidemia 04/01/2007  . Umbilical hernia without mention of obstruction or gangrene 12/15/2006  . Unspecified constipation 12/15/2006  . Osteoarthrosis, unspecified whether generalized or localized, unspecified site 12/15/2006  . Osteoporosis, unspecified 12/15/2006  . Syncope and collapse 12/15/2006  . Urinary frequency 12/15/2006  . Unspecified hypothyroidism 12/11/2006  . Cerebral atrophy 01/21/13  . Other generalized ischemic cerebrovascular disease 01/21/13    small vessel disease   Past  Surgical History  Procedure Laterality Date  . Knee surgery  2008    Dr. Berneice Heinrich  left  . Toe amputation  1986    little toe right foot  Dr. Cliffton Asters; [place]  . Hernia repair  01/2008    laparoscopic Dr. Ovidio Kin  . Cataract extraction w/ intraocular lens  implant, bilateral  2012    Dr. Hazle Quant   Social History:   reports that she has never smoked. She has never used smokeless tobacco. She reports that she does not drink alcohol or use illicit drugs.  History reviewed. No pertinent family history.  Medications: Patient's Medications  New Prescriptions   No medications on file  Previous Medications   ACETAMINOPHEN (TYLENOL) 650 MG CR TABLET    Take 650 mg by mouth every 8 (eight) hours as needed. pain   ASPIRIN 81 MG TABLET    Take 81 mg by mouth daily.   CALCIUM CARBONATE-VITAMIN D (CALCIUM 600+D) 600-200 MG-UNIT TABS    Take 1 tablet by mouth 2 (two) times daily.   CHOLECALCIFEROL (VITAMIN D) 1000 UNITS TABLET    Take 1,000 Units by mouth daily.   LEVOTHYROXINE (SYNTHROID, LEVOTHROID) 50 MCG TABLET    Take 50 mcg by mouth every other day. Alternates with   LEVOTHYROXINE (SYNTHROID, LEVOTHROID) 75 MCG TABLET    Take 75 mcg by mouth every other day. Alternates with   MULTIPLE VITAMIN (MULTIVITAMIN WITH MINERALS) TABS    Take 1 tablet by mouth daily.   OMEGA-3 FATTY ACIDS (FISH OIL) 1200 MG CAPS    Take 1 capsule by mouth daily.   POLYETHYLENE GLYCOL (MIRALAX / GLYCOLAX) PACKET    Take 17 g by mouth daily.   VITAMIN B-12 (CYANOCOBALAMIN) 100 MCG TABLET    Take 500 mcg by mouth daily.    VITAMIN C (ASCORBIC ACID) 500 MG TABLET    Take 500 mg by mouth daily.  Modified Medications   No medications on file  Discontinued Medications   No medications on file     Physical Exam: Physical Exam  Constitutional: She is oriented to person, place, and time. She appears well-developed and well-nourished. No distress.  HENT:  Head: Normocephalic and atraumatic.  Right Ear: External ear normal.  Left Ear: External ear normal.  Nose: Nose normal.  Mouth/Throat: Oropharynx is clear and moist. No oropharyngeal exudate.  Eyes: Conjunctivae and EOM are normal. Pupils are equal, round, and  reactive to light. Right eye exhibits no discharge. Left eye exhibits no discharge. No scleral icterus.  Neck: Normal range of motion. Neck supple. No JVD present. No tracheal deviation present. No thyromegaly present.  Cardiovascular: Normal rate, regular rhythm, normal heart sounds and intact distal pulses.  Exam reveals no gallop and no friction rub.   No murmur heard. Pulmonary/Chest: Effort normal and breath sounds normal. No stridor. No respiratory distress. She has no wheezes. She has no rales. She exhibits no tenderness.  Abdominal: Soft. Bowel sounds are normal. She exhibits no distension and no mass. There is no tenderness. There is no rebound and no guarding.  Hx of S/p the right  inguinal hernia repair.   Musculoskeletal: Normal range of motion. She exhibits tenderness. She exhibits no edema.  The right inner thigh positional pain noted. On and off for a few days.   Lymphadenopathy:    She has no cervical adenopathy.  Neurological: She is alert and oriented to person, place, and time. She has normal reflexes. She displays normal reflexes.  No cranial nerve deficit. She exhibits normal muscle tone. Coordination normal.  Skin: Skin is warm and dry. No rash noted. She is not diaphoretic. No erythema. No pallor.  Psychiatric: She has a normal mood and affect. Her behavior is normal. Judgment and thought content normal.    Filed Vitals:   01/13/13 1440  BP: 130/72  Pulse: 60  Height: 5\' 1"  (1.549 m)  Weight: 154 lb (69.854 kg)      Labs reviewed: Basic Metabolic Panel:  Recent Labs  16/10/96 1147 06/03/12 11/30/12  NA 138 142  --   K 5.5* 4.9  --   CL 103  --   --   CO2 26  --   --   GLUCOSE 94  --   --   BUN 17 16  --   CREATININE 0.90 1.1  --   CALCIUM 10.2  --   --   TSH  --   --  6.49*   Liver Function Tests:  Recent Labs  06/03/12  AST 15  ALT 12  ALKPHOS 52   CBC:  Recent Labs  01/22/12 1147  WBC 7.4  HGB 12.4  HCT 37.1  MCV 87.9  PLT 305    Lipid Panel:  Recent Labs  11/30/12  CHOL 243*  HDL 62  LDLCALC 144  TRIG 045*   Assessment/Plan Unspecified hypothyroidism TSH 6.49--increase Levothyroxine to po daily, update TSH in 3 months  Unspecified constipation Stable on Miralax daily prn  Leg pain The upper right leg--positional-mainly in muscle--will have PT to evaluate and treat as indicated. Celebrex 200mg  po daily for 10 days. May consider X-ray to evaluate further if no better.     Family/ Staff Communication: observe the patient.   Goals of Care: [place]  Labs/tests ordered: TSH in 3 months.

## 2013-01-21 DIAGNOSIS — I6789 Other cerebrovascular disease: Secondary | ICD-10-CM

## 2013-01-21 DIAGNOSIS — G319 Degenerative disease of nervous system, unspecified: Secondary | ICD-10-CM

## 2013-01-21 HISTORY — DX: Degenerative disease of nervous system, unspecified: G31.9

## 2013-01-21 HISTORY — DX: Other cerebrovascular disease: I67.89

## 2013-01-28 DIAGNOSIS — M79609 Pain in unspecified limb: Secondary | ICD-10-CM | POA: Diagnosis not present

## 2013-01-28 DIAGNOSIS — R262 Difficulty in walking, not elsewhere classified: Secondary | ICD-10-CM | POA: Diagnosis not present

## 2013-01-31 DIAGNOSIS — M79609 Pain in unspecified limb: Secondary | ICD-10-CM | POA: Diagnosis not present

## 2013-01-31 DIAGNOSIS — R262 Difficulty in walking, not elsewhere classified: Secondary | ICD-10-CM | POA: Diagnosis not present

## 2013-02-01 DIAGNOSIS — R262 Difficulty in walking, not elsewhere classified: Secondary | ICD-10-CM | POA: Diagnosis not present

## 2013-02-01 DIAGNOSIS — M79609 Pain in unspecified limb: Secondary | ICD-10-CM | POA: Diagnosis not present

## 2013-02-04 DIAGNOSIS — M79609 Pain in unspecified limb: Secondary | ICD-10-CM | POA: Diagnosis not present

## 2013-02-04 DIAGNOSIS — R262 Difficulty in walking, not elsewhere classified: Secondary | ICD-10-CM | POA: Diagnosis not present

## 2013-02-07 DIAGNOSIS — M79609 Pain in unspecified limb: Secondary | ICD-10-CM | POA: Diagnosis not present

## 2013-02-07 DIAGNOSIS — R262 Difficulty in walking, not elsewhere classified: Secondary | ICD-10-CM | POA: Diagnosis not present

## 2013-02-08 DIAGNOSIS — M79609 Pain in unspecified limb: Secondary | ICD-10-CM | POA: Diagnosis not present

## 2013-02-08 DIAGNOSIS — R262 Difficulty in walking, not elsewhere classified: Secondary | ICD-10-CM | POA: Diagnosis not present

## 2013-02-09 DIAGNOSIS — R262 Difficulty in walking, not elsewhere classified: Secondary | ICD-10-CM | POA: Diagnosis not present

## 2013-02-09 DIAGNOSIS — M79609 Pain in unspecified limb: Secondary | ICD-10-CM | POA: Diagnosis not present

## 2013-02-11 DIAGNOSIS — M79609 Pain in unspecified limb: Secondary | ICD-10-CM | POA: Diagnosis not present

## 2013-02-11 DIAGNOSIS — R262 Difficulty in walking, not elsewhere classified: Secondary | ICD-10-CM | POA: Diagnosis not present

## 2013-02-14 DIAGNOSIS — M79609 Pain in unspecified limb: Secondary | ICD-10-CM | POA: Diagnosis not present

## 2013-02-14 DIAGNOSIS — R262 Difficulty in walking, not elsewhere classified: Secondary | ICD-10-CM | POA: Diagnosis not present

## 2013-02-15 DIAGNOSIS — M79609 Pain in unspecified limb: Secondary | ICD-10-CM | POA: Diagnosis not present

## 2013-02-15 DIAGNOSIS — R262 Difficulty in walking, not elsewhere classified: Secondary | ICD-10-CM | POA: Diagnosis not present

## 2013-02-16 DIAGNOSIS — M79609 Pain in unspecified limb: Secondary | ICD-10-CM | POA: Diagnosis not present

## 2013-02-16 DIAGNOSIS — R262 Difficulty in walking, not elsewhere classified: Secondary | ICD-10-CM | POA: Diagnosis not present

## 2013-02-17 DIAGNOSIS — M79609 Pain in unspecified limb: Secondary | ICD-10-CM | POA: Diagnosis not present

## 2013-02-17 DIAGNOSIS — R262 Difficulty in walking, not elsewhere classified: Secondary | ICD-10-CM | POA: Diagnosis not present

## 2013-02-21 DIAGNOSIS — M79609 Pain in unspecified limb: Secondary | ICD-10-CM | POA: Diagnosis not present

## 2013-02-21 DIAGNOSIS — R262 Difficulty in walking, not elsewhere classified: Secondary | ICD-10-CM | POA: Diagnosis not present

## 2013-02-22 DIAGNOSIS — M79609 Pain in unspecified limb: Secondary | ICD-10-CM | POA: Diagnosis not present

## 2013-02-22 DIAGNOSIS — R262 Difficulty in walking, not elsewhere classified: Secondary | ICD-10-CM | POA: Diagnosis not present

## 2013-02-23 DIAGNOSIS — R262 Difficulty in walking, not elsewhere classified: Secondary | ICD-10-CM | POA: Diagnosis not present

## 2013-02-23 DIAGNOSIS — M79609 Pain in unspecified limb: Secondary | ICD-10-CM | POA: Diagnosis not present

## 2013-02-25 DIAGNOSIS — M79609 Pain in unspecified limb: Secondary | ICD-10-CM | POA: Diagnosis not present

## 2013-02-25 DIAGNOSIS — R262 Difficulty in walking, not elsewhere classified: Secondary | ICD-10-CM | POA: Diagnosis not present

## 2013-03-01 DIAGNOSIS — M6281 Muscle weakness (generalized): Secondary | ICD-10-CM | POA: Diagnosis not present

## 2013-03-03 DIAGNOSIS — M6281 Muscle weakness (generalized): Secondary | ICD-10-CM | POA: Diagnosis not present

## 2013-03-07 DIAGNOSIS — M6281 Muscle weakness (generalized): Secondary | ICD-10-CM | POA: Diagnosis not present

## 2013-03-08 DIAGNOSIS — M6281 Muscle weakness (generalized): Secondary | ICD-10-CM | POA: Diagnosis not present

## 2013-03-28 ENCOUNTER — Encounter: Payer: Self-pay | Admitting: Internal Medicine

## 2013-04-12 DIAGNOSIS — E039 Hypothyroidism, unspecified: Secondary | ICD-10-CM | POA: Diagnosis not present

## 2013-04-20 ENCOUNTER — Telehealth: Payer: Self-pay | Admitting: *Deleted

## 2013-04-20 NOTE — Telephone Encounter (Signed)
Received labs from [facility] dated 04/12/2013  TSH:  0.918  Per Dr. Cyndia Skeeters Patient, Normal Thyroid Test  04/20/2013--Patient Notified.

## 2013-05-05 ENCOUNTER — Encounter: Payer: Self-pay | Admitting: Internal Medicine

## 2013-05-05 DIAGNOSIS — H26499 Other secondary cataract, unspecified eye: Secondary | ICD-10-CM | POA: Diagnosis not present

## 2013-05-18 DIAGNOSIS — H26499 Other secondary cataract, unspecified eye: Secondary | ICD-10-CM | POA: Diagnosis not present

## 2013-06-16 DIAGNOSIS — Z79899 Other long term (current) drug therapy: Secondary | ICD-10-CM | POA: Diagnosis not present

## 2013-06-16 DIAGNOSIS — E039 Hypothyroidism, unspecified: Secondary | ICD-10-CM | POA: Diagnosis not present

## 2013-06-16 DIAGNOSIS — E785 Hyperlipidemia, unspecified: Secondary | ICD-10-CM | POA: Diagnosis not present

## 2013-06-16 LAB — LIPID PANEL
Cholesterol: 216 mg/dL — AB (ref 0–200)
HDL: 51 mg/dL (ref 35–70)
LDL CALC: 124 mg/dL
LDl/HDL Ratio: 4.2
TRIGLYCERIDES: 206 mg/dL — AB (ref 40–160)

## 2013-06-16 LAB — BASIC METABOLIC PANEL
BUN: 13 mg/dL (ref 4–21)
CREATININE: 1.1 mg/dL (ref 0.5–1.1)
Glucose: 90 mg/dL
POTASSIUM: 4 mmol/L (ref 3.4–5.3)
Sodium: 142 mmol/L (ref 137–147)

## 2013-06-16 LAB — TSH: TSH: 0.78 u[IU]/mL (ref 0.41–5.90)

## 2013-06-16 LAB — HEPATIC FUNCTION PANEL
ALK PHOS: 55 U/L (ref 25–125)
ALT: 12 U/L (ref 7–35)
AST: 16 U/L (ref 13–35)
BILIRUBIN, TOTAL: 0.5 mg/dL

## 2013-06-23 ENCOUNTER — Encounter: Payer: Self-pay | Admitting: Internal Medicine

## 2013-06-23 ENCOUNTER — Non-Acute Institutional Stay: Payer: Medicare Other | Admitting: Internal Medicine

## 2013-06-23 VITALS — BP 124/76 | HR 72 | Temp 98.0°F | Ht 61.0 in | Wt 152.0 lb

## 2013-06-23 DIAGNOSIS — E039 Hypothyroidism, unspecified: Secondary | ICD-10-CM

## 2013-06-23 DIAGNOSIS — R269 Unspecified abnormalities of gait and mobility: Secondary | ICD-10-CM

## 2013-06-23 DIAGNOSIS — R238 Other skin changes: Secondary | ICD-10-CM | POA: Insufficient documentation

## 2013-06-23 DIAGNOSIS — M79609 Pain in unspecified limb: Secondary | ICD-10-CM

## 2013-06-23 DIAGNOSIS — R609 Edema, unspecified: Secondary | ICD-10-CM | POA: Diagnosis not present

## 2013-06-23 DIAGNOSIS — E785 Hyperlipidemia, unspecified: Secondary | ICD-10-CM

## 2013-06-23 DIAGNOSIS — L988 Other specified disorders of the skin and subcutaneous tissue: Secondary | ICD-10-CM

## 2013-06-23 DIAGNOSIS — M79606 Pain in leg, unspecified: Secondary | ICD-10-CM

## 2013-06-23 MED ORDER — TRIAMCINOLONE ACETONIDE 0.1 % EX CREA: TOPICAL_CREAM | CUTANEOUS | Status: DC

## 2013-06-23 NOTE — Progress Notes (Signed)
Patient ID: Krista Bowman, female   DOB: 10/14/1931, 80 y.o.   MRN: [id number]    Location:  [name]   Place of Service: Clinic (12)  PCP: Estill Dooms, MD  Code Status: LIVING WILL  Extended Emergency Contact Information Primary Emergency Contact: Vicenta Dunning, [place] Mobile Phone: [phone] Relation: Daughter Secondary Emergency Contact: Bussie,Douglas Address: [place] [address] [place] Phone: [phone] Relation: Spouse  Allergies  Allergen Reactions  . Mushroom Extract Complex   . Penicillins Diarrhea    Chief Complaint  Patient presents with  . Medical Management of Chronic Issues    Comprehensive exam: thyroid, cholesterol   . red spots    bilateral lower legs, itch    HPI:  Unspecified hypothyroidism: compensated  Inflammatory papule: started a few weeks ago with a raised red itching papule of the right elbow about 1 inch in diameter. It has cleared up, but she now has 2 more papules; one on each lower leg. They are similar to the first one--itching, red 1-2 inch diameter. No known insect bite. Never had this occur in the past.  Other and unspecified hyperlipidemia; adequately controlled  Edema:resolved  Abnormality of gait: improved  Leg pain: resolved      Past Medical History  Diagnosis Date  . Urinary tract infection, site not specified 01/29/2012  . Abnormality of gait 01/29/2012  . Contusion of face, scalp, and neck except eye(s) 01/29/2012  . Contusion of lower leg 01/29/2012  . Chest pain, unspecified 10/30/2011  . Dysphagia, pharyngoesophageal phase 10/30/2011  . Corns and callosities 08/07/2011  . Pain in joint, lower leg 10/17/2010  . Unspecified hearing loss 04/18/2010  . Edema 10/18/2009  . Other conditions of brain(348.89) 06/05/2009  . Personal history of fall 06/05/2009  . Unspecified vitamin D deficiency 08/24/2008  . Lumbago  05/18/2008  . Family history of malignant neoplasm of breast 2010  . Ventral hernia, unspecified, without mention of obstruction or gangrene 10/08/2007  . Other and unspecified hyperlipidemia 04/01/2007  . Umbilical hernia without mention of obstruction or gangrene 12/15/2006  . Unspecified constipation 12/15/2006  . Osteoarthrosis, unspecified whether generalized or localized, unspecified site 12/15/2006  . Osteoporosis, unspecified 12/15/2006  . Syncope and collapse 12/15/2006  . Urinary frequency 12/15/2006  . Unspecified hypothyroidism 12/11/2006  . Cerebral atrophy 01/21/13  . Other generalized ischemic cerebrovascular disease 01/21/13    small vessel disease    Past Surgical History  Procedure Laterality Date  . Knee surgery  2008    Dr. Alyssa Grove  left  . Toe amputation  1986    little toe right foot  Dr. Dema Severin; [place]  . Hernia repair  01/2008    laparoscopic Dr. [name]  . Cataract extraction w/ intraocular lens  implant, bilateral  2012    Dr. Jasmine Pang Ophthalmology: Dr. Bing Plume Orthopedics: Dr. Rhona Raider Dermatology Dr. [name] [name] GI Dr. Delfin Edis Surgeon Dr. [name] Hand Surgeon Dr. Almira Bar  PAST PROCEDURES 06/13/2003 Orthopantogram no evidence of acute bony injury in the mandible. 06/13/2003 CT Head no evidence of acute intracranial pathology 12/31/2006 Chest x-ray probable COPD with granuloma in right upper lobe. No active lung disease. 02/01/2007 Mammogram negative  02/01/2007 Bone Density Osteoporosis 11/19/2007 CT Abdomen/pelvis splenic granulomas anterior abdominal wall hernia most likely at the level of the umbilicus. Bowel loops  are present within the hernia, but here is no evidence of bowel obstruction at this juncture. 07/03/2008 Mammogram negative 06/03/2009 CT Head  -negative for intracranial hemorrhage. Left frontal bone lesion may represent metastatic disease. No other lesions are identified. 06/03/2009 Left knee x-ray mild medial and  patellofemoral osteoarthritis. No acute osseous abnormality. 06/03/2009 CT Maxillofacial negative for facial fracture 06/05/2009-MRI Head: Mild cerebellar ectopia. No acute infarct. Small vessel disease type changes. 07/04/2009 Mammogram negative 07/11/2010 X-ray Right Foot: No acute fracture. Prior amputation of the right fifth toe. 07/11/2010 X-ray Right Tibia and fibula: No acute abnormality. 07/11/2010 X-ray Right Ankle: No acute abnormality. Mild degenerative change.  11/06/2010  Mammogram negative   12/09/12 DEXA scan  01/12/12 MR brain: SVD, atrophy, C3-4 bulge  01/22/12 CT head; chronic ischemic changes    Social History: History   Social History  . Marital Status: Married    Spouse Name: N/A    Number of Children: N/A  . Years of Education: N/A   Occupational History  . retired Actor     Social History Main Topics  . Smoking status: Never Smoker   . Smokeless tobacco: Never Used  . Alcohol Use: No  . Drug Use: No  . Sexual Activity: No   Other Topics Concern  . None   Social History Narrative   L[facility] at [facility]   Married husband in [place]   Exercise : water exercise 2-3 times a week   Never smoked   No alcohol     Family History Family Status  Relation Status Death Age  . Mother Deceased   . Father Deceased   . Sister Alive   . Daughter Alive   . Son Alive    History reviewed. No pertinent family history.   Medications: Patient's Medications  New Prescriptions   No medications on file  Previous Medications   ACETAMINOPHEN (TYLENOL) 650 MG CR TABLET    Take 650 mg by mouth every 8 (eight) hours as needed. pain   ASPIRIN 81 MG TABLET    Take 81 mg by mouth daily.   CALCIUM CARBONATE-VITAMIN D (CALCIUM 600+D) 600-200 MG-UNIT TABS    Take 1 tablet by mouth 2 (two) times daily.   CHOLECALCIFEROL (VITAMIN D) 1000 UNITS TABLET    Take 1,000 Units by mouth daily.   LEVOTHYROXINE (SYNTHROID, LEVOTHROID) 75 MCG  TABLET    Take 75 mcg by mouth. Take one tablet daily for thyroid   MULTIPLE VITAMIN (MULTIVITAMIN WITH MINERALS) TABS    Take 1 tablet by mouth daily.   OMEGA-3 FATTY ACIDS (FISH OIL) 1200 MG CAPS    Take 1 capsule by mouth daily.   POLYETHYLENE GLYCOL (MIRALAX / GLYCOLAX) PACKET    Take 17 g by mouth daily.   VITAMIN B-12 (CYANOCOBALAMIN) 100 MCG TABLET    Take 500 mcg by mouth daily.    VITAMIN C (ASCORBIC ACID) 500 MG TABLET    Take 500 mg by mouth daily.  Modified Medications   No medications on file  Discontinued Medications   LEVOTHYROXINE (SYNTHROID, LEVOTHROID) 50 MCG TABLET    Take 50 mcg by mouth every other day. Alternates with 70mcg    Immunization History  Administered Date(s) Administered  . Influenza Whole 10/28/2011, 11/10/2012  . Pneumococcal Polysaccharide-23 02/08/2007  . Td 01/28/1991  . Zoster 12/27/2012     Review of Systems  Constitutional: Negative for fever, chills, diaphoresis, activity change, appetite change, fatigue and unexpected weight change.  HENT: Positive for hearing loss. Negative for congestion, ear pain, sore throat and tinnitus.   Eyes:       History of xerophthalmia.  Respiratory: Negative.  Negative for cough, chest tightness and shortness of breath.   Cardiovascular: Negative for chest pain, palpitations and leg swelling.  Gastrointestinal: Negative for nausea, vomiting, abdominal pain, diarrhea and abdominal distention.  Endocrine: Negative for cold intolerance, heat intolerance, polydipsia, polyphagia and polyuria.  Genitourinary: Positive for frequency.       Nocturia x2. Recurrent urinary leakage of small quantities of urine.  Musculoskeletal: Positive for back pain. Negative for arthralgias, gait problem, myalgias and neck pain.  Skin: Negative.   Allergic/Immunologic: Negative.   Neurological: Positive for dizziness. Negative for tremors, syncope, facial asymmetry, weakness, light-headedness and headaches.  Hematological: Negative  for adenopathy. Does not bruise/bleed easily.  Psychiatric/Behavioral: Negative for behavioral problems, confusion, sleep disturbance and agitation. The patient is not nervous/anxious.       Filed Vitals:   06/23/13 1506  BP: 124/76  Pulse: 72  Temp: 98 F (36.7 C)  TempSrc: Oral  Height: 5\' 1"  (1.549 m)  Weight: 152 lb (68.947 kg)   Body mass index is 28.74 kg/(m^2).  Physical Exam  Constitutional: She is oriented to person, place, and time. She appears well-developed and well-nourished. No distress.  HENT:  Head: Normocephalic and atraumatic.  Right Ear: External ear normal.  Left Ear: External ear normal.  Nose: Nose normal.  Mouth/Throat: Oropharynx is clear and moist.   Bilateral hearing loss  Eyes: Conjunctivae and EOM are normal. Pupils are equal, round, and reactive to light.  Corrective lenses  Neck: No JVD present. No tracheal deviation present. No thyromegaly present.  Cardiovascular: Normal rate, regular rhythm, normal heart sounds and intact distal pulses.  Exam reveals no friction rub.   No murmur heard. No varicosities bilaterally. Trace edema.  Pulmonary/Chest: No respiratory distress. She has no wheezes. She has no rales.  Abdominal: She exhibits no distension and no mass. There is no tenderness.  Reducible. Umbilical hernia.  Musculoskeletal: Normal range of motion. She exhibits no edema and no tenderness.  Bilateral knee scars from previous arthroscopic. Some crepitance in knees bilaterally.  Lymphadenopathy:    She has no cervical adenopathy.  Neurological: She is alert and oriented to person, place, and time. She has normal reflexes. No cranial nerve deficit. Coordination normal.  Skin: No rash noted. No erythema. No pallor.  Psychiatric: She has a normal mood and affect. Her behavior is normal. Judgment and thought content normal.        Labs reviewed: Nursing Home on 06/23/2013  Component Date Value Ref Range Status  . Glucose 06/16/2013 90    Final  . BUN 06/16/2013 13  4 - 21 mg/dL Final  . Creatinine 06/16/2013 1.1  0.5 - 1.1 mg/dL Final  . Potassium 06/16/2013 4.0  3.4 - 5.3 mmol/L Final  . Sodium 06/16/2013 142  137 - 147 mmol/L Final  . LDl/HDL Ratio 06/16/2013 4.2   Final  . Triglycerides 06/16/2013 206* 40 - 160 mg/dL Final  . Cholesterol 06/16/2013 216* 0 - 200 mg/dL Final  . HDL 06/16/2013 51  35 - 70 mg/dL Final  . LDL Cholesterol 06/16/2013 124   Final  . Alkaline Phosphatase 06/16/2013 55  25 - 125 U/L Final  . ALT 06/16/2013 12  7 - 35 U/L Final  . AST 06/16/2013 16  13 - 35 U/L Final  . Bilirubin, Total 06/16/2013 0.5   Final  .  TSH 06/16/2013 0.78  0.41 - 5.90 uIU/mL Final     Assessment/Plan  1. Inflammatory papule - triamcinolone cream (KENALOG) 0.1 %; Apply to itching lesions twice daily  Dispense: 30 g; Refill: 0  2. Unspecified hypothyroidism compensated  3. Other and unspecified hyperlipidemia Adequately controlled  4. Edema resolved  5. Abnormality of gait improved  6. Leg pain resolved

## 2013-07-04 ENCOUNTER — Encounter: Payer: Self-pay | Admitting: Internal Medicine

## 2013-08-08 DIAGNOSIS — L03221 Cellulitis of neck: Secondary | ICD-10-CM | POA: Diagnosis not present

## 2013-08-08 DIAGNOSIS — L0211 Cutaneous abscess of neck: Secondary | ICD-10-CM | POA: Diagnosis not present

## 2013-08-08 DIAGNOSIS — R21 Rash and other nonspecific skin eruption: Secondary | ICD-10-CM | POA: Diagnosis not present

## 2013-09-12 ENCOUNTER — Telehealth: Payer: Self-pay | Admitting: Internal Medicine

## 2013-09-12 NOTE — Telephone Encounter (Signed)
Left a message for patient to call back. 

## 2013-09-13 NOTE — Telephone Encounter (Signed)
Spoke with patient and she is having constipation issues. Scheduled with Dr. Olevia Perches on 09/30/13 at 2:30 PM.

## 2013-09-15 ENCOUNTER — Encounter: Payer: Self-pay | Admitting: *Deleted

## 2013-09-30 ENCOUNTER — Ambulatory Visit: Payer: Medicare Other | Admitting: Internal Medicine

## 2013-11-14 DIAGNOSIS — Z23 Encounter for immunization: Secondary | ICD-10-CM | POA: Diagnosis not present

## 2013-11-28 DIAGNOSIS — E785 Hyperlipidemia, unspecified: Secondary | ICD-10-CM | POA: Diagnosis not present

## 2013-11-28 DIAGNOSIS — E039 Hypothyroidism, unspecified: Secondary | ICD-10-CM | POA: Diagnosis not present

## 2013-11-28 LAB — LIPID PANEL
Cholesterol: 217 mg/dL — AB (ref 0–200)
HDL: 53 mg/dL (ref 35–70)
LDL Cholesterol: 126 mg/dL
LDl/HDL Ratio: 4.1
Triglycerides: 191 mg/dL — AB (ref 40–160)

## 2013-11-28 LAB — TSH: TSH: 1.1 u[IU]/mL (ref 0.41–5.90)

## 2013-12-08 ENCOUNTER — Non-Acute Institutional Stay: Payer: Medicare Other | Admitting: Internal Medicine

## 2013-12-08 ENCOUNTER — Encounter: Payer: Self-pay | Admitting: Internal Medicine

## 2013-12-08 VITALS — BP 138/76 | HR 76 | Wt 150.0 lb

## 2013-12-08 DIAGNOSIS — E039 Hypothyroidism, unspecified: Secondary | ICD-10-CM

## 2013-12-08 DIAGNOSIS — R609 Edema, unspecified: Secondary | ICD-10-CM | POA: Diagnosis not present

## 2013-12-08 DIAGNOSIS — E785 Hyperlipidemia, unspecified: Secondary | ICD-10-CM

## 2013-12-08 NOTE — Progress Notes (Signed)
Patient ID: Krista Bowman, female   DOB: 04/04/1931, 80 y.o.   MRN: [id number]    [facility]    Place of Service: Clinic (12) OFFICE   Allergies  Allergen Reactions  . Mushroom Extract Complex   . Penicillins Diarrhea    Chief Complaint  Patient presents with  . Medical Management of Chronic Issues    thyroid, cholesterol     HPI:  Hypothyroidism, unspecified hypothyroidism type: compensated  Hyperlipidemia: adequate control  Edema: improved  Some unsteadiness at night. Has a cold feeling on the right leg distal and laterally.    Medications: Patient's Medications  New Prescriptions   No medications on file  Previous Medications   ACETAMINOPHEN (TYLENOL) 650 MG CR TABLET    Take 650 mg by mouth every 8 (eight) hours as needed. pain   ASPIRIN 81 MG TABLET    Take 81 mg by mouth daily.   CALCIUM CARBONATE-VITAMIN D (CALCIUM 600+D) 600-200 MG-UNIT TABS    Take 1 tablet by mouth 2 (two) times daily.   CHOLECALCIFEROL (VITAMIN D) 1000 UNITS TABLET    Take 1,000 Units by mouth daily.   LEVOTHYROXINE (SYNTHROID, LEVOTHROID) 75 MCG TABLET    Take 75 mcg by mouth. Take one tablet daily for thyroid   MULTIPLE VITAMIN (MULTIVITAMIN WITH MINERALS) TABS    Take 1 tablet by mouth daily.   OMEGA-3 FATTY ACIDS (FISH OIL) 1200 MG CAPS    Take 1 capsule by mouth daily.   POLYETHYLENE GLYCOL (MIRALAX / GLYCOLAX) PACKET    Take 17 g by mouth daily.   TRIAMCINOLONE CREAM (KENALOG) 0.1 %    Apply to itching lesions twice daily   VITAMIN B-12 (CYANOCOBALAMIN) 100 MCG TABLET    Take 500 mcg by mouth daily.   Modified Medications   No medications on file  Discontinued Medications   VITAMIN C (ASCORBIC ACID) 500 MG TABLET    Take 500 mg by mouth daily.     Review of Systems  Constitutional: Negative for fever, chills, diaphoresis, activity change, appetite change, fatigue and unexpected weight change.  HENT: Positive for hearing loss. Negative for congestion, ear  pain, sore throat and tinnitus.   Eyes:       History of xerophthalmia.  Respiratory: Negative.  Negative for cough, chest tightness and shortness of breath.   Cardiovascular: Negative for chest pain, palpitations and leg swelling.  Gastrointestinal: Negative for nausea, vomiting, abdominal pain, diarrhea and abdominal distention.  Endocrine: Negative for cold intolerance, heat intolerance, polydipsia, polyphagia and polyuria.  Genitourinary: Positive for frequency.       Nocturia x2. Recurrent urinary leakage of small quantities of urine.  Musculoskeletal: Positive for back pain. Negative for myalgias, arthralgias, gait problem and neck pain.  Skin: Negative.   Allergic/Immunologic: Negative.   Neurological: Positive for dizziness. Negative for tremors, syncope, facial asymmetry, weakness, light-headedness and headaches.  Hematological: Negative for adenopathy. Does not bruise/bleed easily.  Psychiatric/Behavioral: Negative for behavioral problems, confusion, sleep disturbance and agitation. The patient is not nervous/anxious.     Filed Vitals:   12/08/13 1541  BP: 138/76  Pulse: 76  Weight: 150 lb (68.04 kg)   Body mass index is 28.36 kg/(m^2).  Physical Exam  Constitutional: She is oriented to person, place, and time. She appears well-developed and well-nourished. No distress.  HENT:  Head: Normocephalic and atraumatic.  Right Ear: External ear normal.  Left Ear: External ear normal.  Nose: Nose normal.  Mouth/Throat: Oropharynx is clear and moist.  Bilateral hearing loss  Eyes: Conjunctivae and EOM are normal. Pupils are equal, round, and reactive to light.  Corrective lenses  Neck: No JVD present. No tracheal deviation present. No thyromegaly present.  Cardiovascular: Normal rate, regular rhythm, normal heart sounds and intact distal pulses.  Exam reveals no friction rub.   No murmur heard. No varicosities bilaterally. Trace edema.  Pulmonary/Chest: No respiratory  distress. She has no wheezes. She has no rales.  Abdominal: She exhibits no distension and no mass. There is no tenderness.  Reducible. Umbilical hernia.  Musculoskeletal: Normal range of motion. She exhibits no edema or tenderness.  Bilateral knee scars from previous arthroscopic. Some crepitance in knees bilaterally.  Lymphadenopathy:    She has no cervical adenopathy.  Neurological: She is alert and oriented to person, place, and time. She has normal reflexes. No cranial nerve deficit. Coordination normal.  Skin: No rash noted. No erythema. No pallor.  Psychiatric: She has a normal mood and affect. Her behavior is normal. Judgment and thought content normal.     Labs reviewed: Nursing Home on 12/08/2013  Component Date Value Ref Range Status  . LDl/HDL Ratio 11/28/2013 4.1   Final  . Triglycerides 11/28/2013 191* 40 - 160 mg/dL Final  . Cholesterol 11/28/2013 217* 0 - 200 mg/dL Final  . HDL 11/28/2013 53  35 - 70 mg/dL Final  . LDL Cholesterol 11/28/2013 126   Final  . TSH 11/28/2013 1.10  0.41 - 5.90 uIU/mL Final     Assessment/Plan 1. Hypothyroidism, unspecified hypothyroidism type - TSH, future  2. Hyperlipidemia -Lipids  3. Edema improved

## 2014-03-13 ENCOUNTER — Telehealth: Payer: Self-pay | Admitting: *Deleted

## 2014-03-13 NOTE — Telephone Encounter (Signed)
Opened wrong chart in error (pt's wife, instead of pt's chart. )

## 2014-03-15 DIAGNOSIS — H0012 Chalazion right lower eyelid: Secondary | ICD-10-CM | POA: Diagnosis not present

## 2014-03-15 DIAGNOSIS — H0014 Chalazion left upper eyelid: Secondary | ICD-10-CM | POA: Diagnosis not present

## 2014-03-23 DIAGNOSIS — H0014 Chalazion left upper eyelid: Secondary | ICD-10-CM | POA: Diagnosis not present

## 2014-03-29 DIAGNOSIS — H01004 Unspecified blepharitis left upper eyelid: Secondary | ICD-10-CM | POA: Diagnosis not present

## 2014-03-29 DIAGNOSIS — H0014 Chalazion left upper eyelid: Secondary | ICD-10-CM | POA: Diagnosis not present

## 2014-03-29 DIAGNOSIS — H0289 Other specified disorders of eyelid: Secondary | ICD-10-CM | POA: Diagnosis not present

## 2014-03-29 DIAGNOSIS — H01001 Unspecified blepharitis right upper eyelid: Secondary | ICD-10-CM | POA: Diagnosis not present

## 2014-03-31 ENCOUNTER — Encounter: Payer: Self-pay | Admitting: Internal Medicine

## 2014-04-12 DIAGNOSIS — H0014 Chalazion left upper eyelid: Secondary | ICD-10-CM | POA: Diagnosis not present

## 2014-05-30 DIAGNOSIS — E785 Hyperlipidemia, unspecified: Secondary | ICD-10-CM | POA: Diagnosis not present

## 2014-05-30 DIAGNOSIS — E039 Hypothyroidism, unspecified: Secondary | ICD-10-CM | POA: Diagnosis not present

## 2014-05-30 DIAGNOSIS — R609 Edema, unspecified: Secondary | ICD-10-CM | POA: Diagnosis not present

## 2014-05-30 LAB — BASIC METABOLIC PANEL
BUN: 19 mg/dL (ref 4–21)
Creatinine: 1.1 mg/dL (ref 0.5–1.1)
GLUCOSE: 100 mg/dL
POTASSIUM: 4.3 mmol/L (ref 3.4–5.3)
Sodium: 141 mmol/L (ref 137–147)

## 2014-05-30 LAB — LIPID PANEL
Cholesterol: 215 mg/dL — AB (ref 0–200)
HDL: 56 mg/dL (ref 35–70)
LDL Cholesterol: 127 mg/dL
TRIGLYCERIDES: 158 mg/dL (ref 40–160)

## 2014-05-30 LAB — TSH: TSH: 0.6 u[IU]/mL (ref 0.41–5.90)

## 2014-06-01 DIAGNOSIS — H35372 Puckering of macula, left eye: Secondary | ICD-10-CM | POA: Diagnosis not present

## 2014-06-01 DIAGNOSIS — H524 Presbyopia: Secondary | ICD-10-CM | POA: Diagnosis not present

## 2014-06-01 DIAGNOSIS — Z961 Presence of intraocular lens: Secondary | ICD-10-CM | POA: Diagnosis not present

## 2014-06-08 ENCOUNTER — Non-Acute Institutional Stay (INDEPENDENT_AMBULATORY_CARE_PROVIDER_SITE_OTHER): Payer: Medicare Other | Admitting: Internal Medicine

## 2014-06-08 ENCOUNTER — Encounter: Payer: Self-pay | Admitting: Internal Medicine

## 2014-06-08 VITALS — BP 136/68 | HR 80 | Temp 97.7°F | Wt 149.0 lb

## 2014-06-08 DIAGNOSIS — R609 Edema, unspecified: Secondary | ICD-10-CM | POA: Diagnosis not present

## 2014-06-08 DIAGNOSIS — E785 Hyperlipidemia, unspecified: Secondary | ICD-10-CM | POA: Diagnosis not present

## 2014-06-08 DIAGNOSIS — E039 Hypothyroidism, unspecified: Secondary | ICD-10-CM

## 2014-06-08 NOTE — Progress Notes (Signed)
Patient ID: Krista Bowman, female   DOB: 01-15-1932, 81 y.o.   MRN: [id number]    [name]     [facility] of Service: Clinic (12)     Allergies  Allergen Reactions  . Mushroom Extract Complex   . Penicillins Diarrhea    Chief Complaint  Patient presents with  . Medical Management of Chronic Issues    thyroid, cholesterol.  Talk about MOST form    HPI:  Hypothyroidism, unspecified hypothyroidism type: compensated  Hyperlipidemia: controlled  Edema: resolved    Medications: Patient's Medications  New Prescriptions   No medications on file  Previous Medications   ACETAMINOPHEN (TYLENOL) 650 MG CR TABLET    Take 650 mg by mouth every 8 (eight) hours as needed. pain   ASPIRIN 81 MG TABLET    Take 81 mg by mouth daily.   CALCIUM CARBONATE-VITAMIN D (CALCIUM 600+D) 600-200 MG-UNIT TABS    Take 1 tablet by mouth 2 (two) times daily.   CHOLECALCIFEROL (VITAMIN D) 1000 UNITS TABLET    Take 1,000 Units by mouth daily.   LEVOTHYROXINE (SYNTHROID, LEVOTHROID) 75 MCG TABLET    Take 75 mcg by mouth. Take one tablet daily for thyroid   MULTIPLE VITAMIN (MULTIVITAMIN WITH MINERALS) TABS    Take 1 tablet by mouth daily.   OMEGA-3 FATTY ACIDS (FISH OIL) 1200 MG CAPS    Take 1 capsule by mouth daily.   POLYETHYLENE GLYCOL (MIRALAX / GLYCOLAX) PACKET    Take 17 g by mouth daily.   TRIAMCINOLONE CREAM (KENALOG) 0.1 %    Apply to itching lesions twice daily   VITAMIN B-12 (CYANOCOBALAMIN) 100 MCG TABLET    Take 500 mcg by mouth daily.   Modified Medications   No medications on file  Discontinued Medications   No medications on file     Review of Systems  Constitutional: Negative for fever, chills, diaphoresis, activity change, appetite change, fatigue and unexpected weight change.  HENT: Positive for hearing loss. Negative for congestion, ear pain, sore throat and tinnitus.   Eyes:       History of xerophthalmia.  Respiratory: Positive for cough. Negative for chest  tightness and shortness of breath.   Cardiovascular: Negative for chest pain, palpitations and leg swelling.  Gastrointestinal: Negative for nausea, vomiting, abdominal pain, diarrhea and abdominal distention.  Endocrine: Negative for cold intolerance, heat intolerance, polydipsia, polyphagia and polyuria.  Genitourinary: Positive for frequency.       Nocturia x2. Recurrent urinary leakage of small quantities of urine.  Musculoskeletal: Negative for myalgias, back pain, arthralgias, gait problem and neck pain.  Skin: Negative.   Allergic/Immunologic: Negative.   Neurological: Negative for dizziness, tremors, syncope, facial asymmetry, weakness, light-headedness and headaches.  Hematological: Negative for adenopathy. Does not bruise/bleed easily.  Psychiatric/Behavioral: Negative for behavioral problems, confusion, sleep disturbance and agitation. The patient is not nervous/anxious.     Filed Vitals:   06/08/14 1528  BP: 136/68  Pulse: 80  Temp: 97.7 F (36.5 C)  Weight: 149 lb (67.586 kg)   Body mass index is 28.17 kg/(m^2).  Physical Exam  Constitutional: She is oriented to person, place, and time. She appears well-developed and well-nourished. No distress.  HENT:  Head: Normocephalic and atraumatic.  Right Ear: External ear normal.  Left Ear: External ear normal.  Nose: Nose normal.  Mouth/Throat: Oropharynx is clear and moist.   Bilateral hearing loss  Eyes: Conjunctivae and EOM are normal. Pupils are equal, round, and reactive to light.  Corrective lenses  Neck: No JVD present. No tracheal deviation present. No thyromegaly present.  Cardiovascular: Normal rate, regular rhythm, normal heart sounds and intact distal pulses.  Exam reveals no friction rub.   No murmur heard. No varicosities bilaterally.   Pulmonary/Chest: No respiratory distress. She has no wheezes. She has no rales.  Abdominal: She exhibits no distension and no mass. There is no tenderness.  Reducible.  Umbilical hernia.  Musculoskeletal: Normal range of motion. She exhibits no edema or tenderness.  Bilateral knee scars from previous arthroscopic. Some crepitance in knees bilaterally.  Lymphadenopathy:    She has no cervical adenopathy.  Neurological: She is alert and oriented to person, place, and time. She has normal reflexes. No cranial nerve deficit. Coordination normal.  Skin: No rash noted. No erythema. No pallor.  Psychiatric: She has a normal mood and affect. Her behavior is normal. Judgment and thought content normal.     Labs reviewed: Nursing Home on 06/08/2014  Component Date Value Ref Range Status  . Glucose 05/30/2014 100   Final  . BUN 05/30/2014 19  4 - 21 mg/dL Final  . Creatinine 05/30/2014 1.1  0.5 - 1.1 mg/dL Final  . Potassium 05/30/2014 4.3  3.4 - 5.3 mmol/L Final  . Sodium 05/30/2014 141  137 - 147 mmol/L Final  . Triglycerides 05/30/2014 158  40 - 160 mg/dL Final  . Cholesterol 05/30/2014 215* 0 - 200 mg/dL Final  . HDL 05/30/2014 56  35 - 70 mg/dL Final  . LDL Cholesterol 05/30/2014 127   Final  . TSH 05/30/2014 0.60  0.41 - 5.90 uIU/mL Final     Assessment/Plan  1. Hypothyroidism, unspecified hypothyroidism type Continue current dose levothyroxine  2. Hyperlipidemia controlled  3. Edema resolved  Lengthy discussion about MOST form. She does want resuscitation and a period of time on artificial hydration and nutrition if needed. I recommended that she get the LIVING WILL and HCPOA updated and bring [place] a copy.

## 2014-06-15 NOTE — Addendum Note (Signed)
Addended by: Estill Dooms on: 06/15/2014 02:03 PM   Modules accepted: Level of Service

## 2014-10-26 DIAGNOSIS — Z23 Encounter for immunization: Secondary | ICD-10-CM | POA: Diagnosis not present

## 2014-12-07 ENCOUNTER — Encounter: Payer: Self-pay | Admitting: Internal Medicine

## 2015-02-01 ENCOUNTER — Encounter: Payer: Self-pay | Admitting: Internal Medicine

## 2015-02-15 ENCOUNTER — Other Ambulatory Visit: Payer: Self-pay

## 2015-02-15 MED ORDER — LEVOTHYROXINE SODIUM 75 MCG PO TABS
ORAL_TABLET | ORAL | Status: DC
Start: 2015-02-15 — End: 2015-11-01

## 2015-02-15 NOTE — Telephone Encounter (Signed)
Patient came by [facility] clinic asking for Rx on Levothyroxine #90 3 refills for mail order. Written, Dr. Nyoka Cowden sign

## 2015-02-26 DIAGNOSIS — J3489 Other specified disorders of nose and nasal sinuses: Secondary | ICD-10-CM | POA: Diagnosis not present

## 2015-02-26 DIAGNOSIS — R05 Cough: Secondary | ICD-10-CM | POA: Diagnosis not present

## 2015-04-03 DIAGNOSIS — E039 Hypothyroidism, unspecified: Secondary | ICD-10-CM | POA: Diagnosis not present

## 2015-04-03 DIAGNOSIS — E785 Hyperlipidemia, unspecified: Secondary | ICD-10-CM | POA: Diagnosis not present

## 2015-04-03 LAB — BASIC METABOLIC PANEL
BUN: 19 mg/dL (ref 4–21)
CREATININE: 1 mg/dL (ref 0.5–1.1)
GLUCOSE: 102 mg/dL
Potassium: 4.3 mmol/L (ref 3.4–5.3)
Sodium: 141 mmol/L (ref 137–147)

## 2015-04-03 LAB — LIPID PANEL
Cholesterol: 235 mg/dL — AB (ref 0–200)
HDL: 59 mg/dL (ref 35–70)
LDL CALC: 138 mg/dL
Triglycerides: 192 mg/dL — AB (ref 40–160)

## 2015-04-03 LAB — HEPATIC FUNCTION PANEL
ALK PHOS: 61 U/L (ref 25–125)
ALT: 14 U/L (ref 7–35)
AST: 16 U/L (ref 13–35)
BILIRUBIN, TOTAL: 0.5 mg/dL

## 2015-04-03 LAB — TSH: TSH: 1.26 u[IU]/mL (ref 0.41–5.90)

## 2015-04-04 ENCOUNTER — Encounter: Payer: Self-pay | Admitting: *Deleted

## 2015-04-12 ENCOUNTER — Non-Acute Institutional Stay: Payer: Medicare Other | Admitting: Internal Medicine

## 2015-04-12 ENCOUNTER — Encounter: Payer: Self-pay | Admitting: Internal Medicine

## 2015-04-12 VITALS — BP 132/82 | HR 84 | Temp 97.7°F | Ht 61.0 in | Wt 148.0 lb

## 2015-04-12 DIAGNOSIS — E785 Hyperlipidemia, unspecified: Secondary | ICD-10-CM | POA: Diagnosis not present

## 2015-04-12 DIAGNOSIS — E039 Hypothyroidism, unspecified: Secondary | ICD-10-CM

## 2015-04-12 DIAGNOSIS — R609 Edema, unspecified: Secondary | ICD-10-CM | POA: Diagnosis not present

## 2015-04-12 NOTE — Progress Notes (Signed)
Patient ID: Krista Bowman, female   DOB: 1931/07/20, 82 y.o.   MRN: [id number]    HISTORY AND PHYSICAL  Location:  [place] of Service: Clinic (12)   Extended Emergency Contact Information Primary Emergency Contact: Vicenta Dunning, [place] Mobile Phone: [phone] Relation: Daughter Secondary Emergency Contact: Bonnette,Douglas Address: [place] [address] [place] Phone: [phone] Relation: Spouse  Advanced Directive information:  Full code Does patient have an advance directive?: Yes, Type of Advance Directive: Healthcare Power of [place];Living will;Out of facility DNR (pink MOST or yellow form)  Chief Complaint  Patient presents with  . Annual Exam    Wellness exam  . Medical Management of Chronic Issues    thyroid, cholesterol   . thigh pain    off and on since Jan. only when walking  . MMSE    30/30 passed clock drawing    HPI:  Painful twinge in the right thigh for a couple of months. No cramps. Sometime it feels like she might fall.  Hypothyroidism, unspecified hypothyroidism type - compensated  Hyperlipidemia - runs high HDL. Satisfactory control.  Edema, unspecified type - resolved.    Past Medical History  Diagnosis Date  . Urinary tract infection, site not specified   . Abnormality of gait   . Contusion of face, scalp, and neck except eye(s)   . Contusion of lower leg   . Chest pain, unspecified   . Corns and callosities   . Pain in joint, lower leg   . Unspecified hearing loss   . Edema   . Other conditions of brain(348.89)   . Personal history of fall   . Unspecified vitamin D deficiency   . Lumbago   . Family history of malignant neoplasm of breast   . Ventral hernia, unspecified, without mention of obstruction or gangrene   . Other and unspecified hyperlipidemia   . Umbilical hernia without mention of obstruction or gangrene     . Unspecified constipation   . Osteoarthrosis, unspecified whether generalized or localized, unspecified site   . Osteoporosis, unspecified   . Syncope and collapse   . Urinary frequency   . Unspecified hypothyroidism   . Cerebral atrophy 01/21/13  . Other generalized ischemic cerebrovascular disease 01/21/13    small vessel disease    Past Surgical History  Procedure Laterality Date  . Knee surgery  2008    Dr. Alyssa Grove  left  . Toe amputation  1986    little toe right foot  Dr. Dema Severin; [facility]  . Hernia repair  01/2008    laparoscopic Dr. [name]  . Cataract extraction w/ intraocular lens  implant, bilateral  2012    Dr. Bing Plume    Patient Care Team: Estill Dooms, MD as PCP - General (Internal Medicine) [place] [name] X, NP as Nurse Practitioner (Nurse Practitioner) [name], MD as Consulting Physician (Ophthalmology) [name], MD as Consulting Physician (Orthopedic Surgery) [name] [name], MD as Consulting Physician (Dermatology) [name], MD as Consulting Physician (Gastroenterology) [name], MD as Consulting Physician (General Surgery)  Social History   Social History  . Marital Status: Married    Spouse Name: N/A  . Number of Children: N/A  . Years of Education: N/A   Occupational History  . retired Actor     Social  History Main Topics  . Smoking status: Never Smoker   . Smokeless tobacco: Never Used  . Alcohol Use: No  . Drug Use: No  . Sexual Activity: No   Other Topics Concern  . Not on file   Social History Narrative   [facility]   Married husband in [place]   Exercise : water exercise 2-3 times a week   Never smoked   No alcohol     reports that she has never smoked. She has never used smokeless tobacco. She reports that she does not drink alcohol or use illicit drugs.  History reviewed. No pertinent family history. Family Status  Relation Status  Death Age  . Mother Deceased   . Father Deceased   . Sister Alive   . Daughter Alive   . Son Alive     Immunization History  Administered Date(s) Administered  . Influenza Whole 10/28/2011, 11/10/2012  . Influenza-Unspecified 11/14/2013, 10/26/2014  . Pneumococcal Polysaccharide-23 02/08/2007  . Td 01/28/1991  . Zoster 12/27/2012    Allergies  Allergen Reactions  . Mushroom Extract Complex   . Penicillins Diarrhea    Medications: Patient's Medications  New Prescriptions   No medications on file  Previous Medications   ACETAMINOPHEN (TYLENOL) 650 MG CR TABLET    Take 650 mg by mouth every 8 (eight) hours as needed. pain   ASPIRIN 81 MG TABLET    Take 81 mg by mouth daily.   CALCIUM CARBONATE-VITAMIN D (CALCIUM 600+D) 600-200 MG-UNIT TABS    Take 1 tablet by mouth 2 (two) times daily.   CHOLECALCIFEROL (VITAMIN D) 1000 UNITS TABLET    Take 1,000 Units by mouth daily.   LEVOTHYROXINE (SYNTHROID, LEVOTHROID) 75 MCG TABLET    Take one tablet daily for thyroid 30 minutes prior to breakfast   MULTIPLE VITAMIN (MULTIVITAMIN WITH MINERALS) TABS    Take 1 tablet by mouth daily.   OMEGA-3 FATTY ACIDS (FISH OIL) 1200 MG CAPS    Take 1 capsule by mouth daily.   POLYETHYLENE GLYCOL (MIRALAX / GLYCOLAX) PACKET    Take 17 g by mouth daily.   TRIAMCINOLONE CREAM (KENALOG) 0.1 %    Apply to itching lesions twice daily   VITAMIN B-12 (CYANOCOBALAMIN) 100 MCG TABLET    Take 500 mcg by mouth daily.   Modified Medications   No medications on file  Discontinued Medications   No medications on file    Review of Systems  Constitutional: Negative for fever, chills, diaphoresis, activity change, appetite change, fatigue and unexpected weight change.  HENT: Positive for hearing loss. Negative for congestion, ear pain, sore throat and tinnitus.   Eyes:       History of xerophthalmia.  Respiratory: Positive for cough. Negative for chest tightness and shortness of breath.   Cardiovascular:  Negative for chest pain, palpitations and leg swelling.  Gastrointestinal: Positive for constipation (using Miralax prn). Negative for nausea, vomiting, abdominal pain, diarrhea and abdominal distention.  Endocrine: Negative for cold intolerance, heat intolerance, polydipsia, polyphagia and polyuria.  Genitourinary: Positive for frequency.       Nocturia x2. Recurrent urinary leakage of small quantities of urine.  Musculoskeletal: Negative for myalgias, back pain, arthralgias, gait problem and neck pain.  Skin: Negative.   Allergic/Immunologic: Negative.   Neurological: Negative for dizziness, tremors, syncope, facial asymmetry, weakness, light-headedness and headaches.  Hematological: Negative for adenopathy. Does not bruise/bleed easily.  Psychiatric/Behavioral: Negative for behavioral problems, confusion, sleep disturbance and  agitation. The patient is not nervous/anxious.     Filed Vitals:   04/12/15 1500  BP: 132/82  Pulse: 84  Temp: 97.7 F (36.5 C)  TempSrc: Oral  Height: _0  (1.549 m)  Weight: 148 lb (67.132 kg)  SpO2: 95%   Body mass index is 27.98 kg/(m^2). Filed Weights   04/12/15 1500  Weight: 148 lb (67.132 kg)     Physical Exam  Constitutional: She is oriented to person, place, and time. She appears well-developed and well-nourished. No distress.  HENT:  Head: Normocephalic and atraumatic.  Right Ear: External ear normal.  Left Ear: External ear normal.  Nose: Nose normal.  Mouth/Throat: Oropharynx is clear and moist.   Bilateral hearing loss  Eyes: Conjunctivae and EOM are normal. Pupils are equal, round, and reactive to light.  Corrective lenses  Neck: No JVD present. No tracheal deviation present. No thyromegaly present.  Cardiovascular: Normal rate, regular rhythm, normal heart sounds and intact distal pulses.  Exam reveals no friction rub.   No murmur heard. No varicosities bilaterally.   Pulmonary/Chest: No respiratory distress. She has no wheezes.  She has no rales.  Abdominal: She exhibits no distension and no mass. There is no tenderness.  Reducible. Umbilical hernia.  Musculoskeletal: Normal range of motion. She exhibits no edema or tenderness.  Bilateral knee scars from previous arthroscopic. Some crepitance in knees bilaterally.  Lymphadenopathy:    She has no cervical adenopathy.  Neurological: She is alert and oriented to person, place, and time. She has normal reflexes. No cranial nerve deficit. Coordination normal.  04/12/15 MMSE 30/30. Passed clock drawing.  Skin: No rash noted. No erythema. No pallor.  Psychiatric: She has a normal mood and affect. Her behavior is normal. Judgment and thought content normal.    Labs reviewed: Lab Summary Latest Ref Rng 04/03/2015 05/30/2014 11/28/2013 06/16/2013  Hemoglobin 13.0-17.0 g/dL (None) (None) (None) (None)  Hematocrit 39.0-52.0 % (None) (None) (None) (None)  White count - (None) (None) (None) (None)  Platelet count - (None) (None) (None) (None)  Sodium 137 - 147 mmol/L 141 141 (None) 142  Potassium 3.4 - 5.3 mmol/L 4.3 4.3 (None) 4.0  Calcium - (None) (None) (None) (None)  Phosphorus - (None) (None) (None) (None)  Creatinine 0.5 - 1.1 mg/dL 1.0 1.1 (None) 1.1  AST 13 - 35 U/L 16 (None) (None) 16  Alk Phos 25 - 125 U/L 61 (None) (None) 55  Bilirubin - (None) (None) (None) (None)  Glucose - 102 100 (None) 90  Cholesterol 0 - 200 mg/dL 235(A) 215(A) 217(A) 216(A)  HDL cholesterol 35 - 70 mg/dL 59 56 53 51  Triglycerides 40 - 160 mg/dL 192(A) 158 191(A) 206(A)  LDL Direct - (None) (None) (None) (None)  LDL Calc - 138 127 126 124  Total protein - (None) (None) (None) (None)  Albumin - (None) (None) (None) (None)   Lab Results  Component Value Date   BUN 19 04/03/2015   No results found for: HGBA1C Lab Results  Component Value Date   TSH 1.26 04/03/2015       Assessment/Plan  1. Hypothyroidism, unspecified hypothyroidism type Continue current dose levothyroxine  2.  Hyperlipidemia Satisfactory control  3. Edema, unspecified type resolved

## 2015-04-12 NOTE — Progress Notes (Signed)
Patient ID: Krista Bowman, female   DOB: 20-Jul-1931, 82 y.o.   MRN: [id number] MMSE 30/30 passed clock drawing

## 2015-04-17 NOTE — Addendum Note (Signed)
Addended by: Estill Dooms on: 04/17/2015 05:28 PM   Modules accepted: Level of Service

## 2015-05-23 DIAGNOSIS — R509 Fever, unspecified: Secondary | ICD-10-CM | POA: Diagnosis not present

## 2015-05-23 DIAGNOSIS — N763 Subacute and chronic vulvitis: Secondary | ICD-10-CM | POA: Diagnosis not present

## 2015-05-24 ENCOUNTER — Encounter: Payer: Self-pay | Admitting: Internal Medicine

## 2015-05-24 ENCOUNTER — Non-Acute Institutional Stay: Payer: Medicare Other | Admitting: Internal Medicine

## 2015-05-24 VITALS — BP 140/74 | HR 84 | Temp 97.7°F | Ht 61.0 in | Wt 150.0 lb

## 2015-05-24 DIAGNOSIS — M545 Low back pain, unspecified: Secondary | ICD-10-CM

## 2015-05-24 DIAGNOSIS — R1030 Lower abdominal pain, unspecified: Secondary | ICD-10-CM

## 2015-05-24 DIAGNOSIS — R109 Unspecified abdominal pain: Secondary | ICD-10-CM | POA: Insufficient documentation

## 2015-05-24 MED ORDER — CYCLOBENZAPRINE HCL 5 MG PO TABS: ORAL_TABLET | ORAL | Status: DC

## 2015-05-24 MED ORDER — CIPROFLOXACIN HCL 250 MG PO TABS: ORAL_TABLET | ORAL | Status: DC

## 2015-05-24 NOTE — Progress Notes (Signed)
Patient ID: Krista Bowman, female   DOB: Nov 01, 1931, 82 y.o.   MRN: [id number]    [name] (12[facility]     Allergies  Allergen Reactions  . Mushroom Extract Complex   . Penicillins Diarrhea    Chief Complaint  Patient presents with  . Acute Visit    right back pain radiates to right abdomen, started Sunday, no rash. [facility] got urine 05/23/15 Primary results Ketone trace, blood 2+, leukocyte 1+,  microscopic WBC 0-5, RBC 0-2, crystals many , Culture pending     HPI:  Abd pain and back pains started 5 days ago. No fever. No nausea. No known injury or overuse. Tylenol has not helped the back. Denies tight feeling or increase in pain with deep breathing.  Urinalysis showed trace ketones 0-2 RBCs and 0-5 WBCs. There were many crystals. Nitrite was negative. Culture is pending.  Medications: Patient's Medications  New Prescriptions   No medications on file  Previous Medications   ACETAMINOPHEN (TYLENOL) 650 MG CR TABLET    Take 650 mg by mouth every 8 (eight) hours as needed. pain   ASPIRIN 81 MG TABLET    Take 81 mg by mouth daily.   CALCIUM CARBONATE-VITAMIN D (CALCIUM 600+D) 600-200 MG-UNIT TABS    Take 1 tablet by mouth 2 (two) times daily.   CHOLECALCIFEROL (VITAMIN D) 1000 UNITS TABLET    Take 1,000 Units by mouth daily.   LEVOTHYROXINE (SYNTHROID, LEVOTHROID) 75 MCG TABLET    Take one tablet daily for thyroid 30 minutes prior to breakfast   MULTIPLE VITAMIN (MULTIVITAMIN WITH MINERALS) TABS    Take 1 tablet by mouth daily.   OMEGA-3 FATTY ACIDS (FISH OIL) 1200 MG CAPS    Take 1 capsule by mouth daily.   POLYETHYLENE GLYCOL (MIRALAX / GLYCOLAX) PACKET    Take 17 g by mouth daily.   VITAMIN B-12 (CYANOCOBALAMIN) 100 MCG TABLET    Take 500 mcg by mouth daily.   Modified Medications   No medications on file  Discontinued Medications   TRIAMCINOLONE CREAM (KENALOG) 0.1 %    Apply to itching lesions twice daily     Review of Systems    Constitutional: Negative for fever, chills, diaphoresis, activity change, appetite change, fatigue and unexpected weight change.  HENT: Positive for hearing loss. Negative for congestion, ear pain, sore throat and tinnitus.   Eyes:       History of xerophthalmia.  Respiratory: Positive for cough. Negative for chest tightness and shortness of breath.   Cardiovascular: Negative for chest pain, palpitations and leg swelling.  Gastrointestinal: Positive for abdominal pain (Lower abdomen) and constipation (using Miralax prn). Negative for nausea, vomiting, diarrhea and abdominal distention.  Endocrine: Negative for cold intolerance, heat intolerance, polydipsia, polyphagia and polyuria.  Genitourinary: Positive for frequency.       Nocturia x2. Recurrent urinary leakage of small quantities of urine.  Musculoskeletal: Positive for back pain. Negative for myalgias, arthralgias, gait problem and neck pain.  Skin: Negative.   Allergic/Immunologic: Negative.   Neurological: Negative for dizziness, tremors, syncope, facial asymmetry, weakness, light-headedness and headaches.  Hematological: Negative for adenopathy. Does not bruise/bleed easily.  Psychiatric/Behavioral: Negative for behavioral problems, confusion, sleep disturbance and agitation. The patient is not nervous/anxious.     Filed Vitals:   05/24/15 1626  BP: 140/74  Pulse: 84  Temp: 97.7 F (36.5 C)  TempSrc: Oral  Height: '5\' 1"'  (1.549 m)  Weight: 150 lb (68.04 kg)  SpO2: 94%   Wt Readings from Last 3 Encounters:  05/24/15 150 lb (68.04 kg)  04/12/15 148 lb (67.132 kg)  06/08/14 149 lb (67.586 kg)    Body mass index is 28.36 kg/(m^2).  Physical Exam  Constitutional: She is oriented to person, place, and time. She appears well-developed and well-nourished. No distress.  HENT:  Head: Normocephalic and atraumatic.  Right Ear: External ear normal.  Left Ear: External ear normal.  Nose: Nose normal.  Mouth/Throat: Oropharynx  is clear and moist.   Bilateral hearing loss  Eyes: Conjunctivae and EOM are normal. Pupils are equal, round, and reactive to light.  Corrective lenses  Neck: No JVD present. No tracheal deviation present. No thyromegaly present.  Cardiovascular: Normal rate, regular rhythm, normal heart sounds and intact distal pulses.  Exam reveals no friction rub.   No murmur heard. No varicosities bilaterally.   Pulmonary/Chest: No respiratory distress. She has no wheezes. She has no rales.  Abdominal: She exhibits no distension and no mass. There is no tenderness.  Reducible Umbilical hernia. Active bowel sounds. No mass.  Musculoskeletal: Normal range of motion. She exhibits no edema or tenderness.  Bilateral knee scars from previous arthroscopic. Some crepitance in knees bilaterally. Low back discomfort in the lumbosacral area as well as sacroiliac. She is not tender in the flank or costovertebral angles.  Lymphadenopathy:    She has no cervical adenopathy.  Neurological: She is alert and oriented to person, place, and time. She has normal reflexes. No cranial nerve deficit. Coordination normal.  04/12/15 MMSE 30/30. Passed clock drawing.  Skin: No rash noted. No erythema. No pallor.  Psychiatric: She has a normal mood and affect. Her behavior is normal. Judgment and thought content normal.     Labs reviewed: Lab Summary Latest Ref Rng 04/03/2015 05/30/2014 11/28/2013 06/16/2013  Hemoglobin 13.0-17.0 g/dL (None) (None) (None) (None)  Hematocrit 39.0-52.0 % (None) (None) (None) (None)  White count - (None) (None) (None) (None)  Platelet count - (None) (None) (None) (None)  Sodium 137 - 147 mmol/L 141 141 (None) 142  Potassium 3.4 - 5.3 mmol/L 4.3 4.3 (None) 4.0  Calcium - (None) (None) (None) (None)  Phosphorus - (None) (None) (None) (None)  Creatinine 0.5 - 1.1 mg/dL 1.0 1.1 (None) 1.1  AST 13 - 35 U/L 16 (None) (None) 16  Alk Phos 25 - 125 U/L 61 (None) (None) 55  Bilirubin - (None) (None)  (None) (None)  Glucose - 102 100 (None) 90  Cholesterol 0 - 200 mg/dL 235(A) 215(A) 217(A) 216(A)  HDL cholesterol 35 - 70 mg/dL 59 56 53 51  Triglycerides 40 - 160 mg/dL 192(A) 158 191(A) 206(A)  LDL Direct - (None) (None) (None) (None)  LDL Calc - 138 127 126 124  Total protein - (None) (None) (None) (None)  Albumin - (None) (None) (None) (None)   Lab Results  Component Value Date   TSH 1.26 04/03/2015   Lab Results  Component Value Date   BUN 19 04/03/2015   BUN 19 05/30/2014   BUN 13 06/16/2013   Lab Results  Component Value Date   CREATININE 1.0 04/03/2015   CREATININE 1.1 05/30/2014   CREATININE 1.1 06/16/2013   No results found for: HGBA1C     Assessment/Plan  1. Midline low back pain without sciatica -Aleve 220 mg up to 3 times daily for pain - cyclobenzaprine (FLEXERIL) 5 MG tablet; 1 tablet 3 times daily as needed for muscle spasm  Dispense: 30 tablet; Refill: 1  2. Lower  abdominal pain -Culture pending - ciprofloxacin (CIPRO) 250 MG tablet; One twice daily for infection  Dispense: 6 tablet; Refill: 0  \

## 2015-05-28 ENCOUNTER — Telehealth: Payer: Self-pay | Admitting: *Deleted

## 2015-05-28 NOTE — Telephone Encounter (Signed)
For uncomplicated urine infections, three days is a proper course of therapy.  If she is constipated, I recommend she take a stool softener like senna s if the stools are hard or use miralax one dose to help out with this.  The increase in water intake should also help.

## 2015-05-28 NOTE — Telephone Encounter (Signed)
Spoke with patient and advised results, pt understand

## 2015-05-28 NOTE — Telephone Encounter (Signed)
Patient called back and I advised results, she wanted to know if she should have more than 3 days worth of medication? She's feeling ok, but think she needs the abx for more days. Per pt she is drinking more water, and she also has some constipation. Please advise

## 2015-05-28 NOTE — Telephone Encounter (Signed)
.  left message to have patient return my call.   Calling about lab results, urine culture, per note written on culture results, " urine culture is susceptible to the cipro, she was prescribed on 05/24/15. Hopefully she is feeling better. She should finish the antibiotic and drink plenty of water to help flush out the infection and prevent more in the future"

## 2015-06-04 DIAGNOSIS — H04123 Dry eye syndrome of bilateral lacrimal glands: Secondary | ICD-10-CM | POA: Diagnosis not present

## 2015-06-04 DIAGNOSIS — H35372 Puckering of macula, left eye: Secondary | ICD-10-CM | POA: Diagnosis not present

## 2015-06-04 DIAGNOSIS — H524 Presbyopia: Secondary | ICD-10-CM | POA: Diagnosis not present

## 2015-08-13 DIAGNOSIS — N390 Urinary tract infection, site not specified: Secondary | ICD-10-CM | POA: Diagnosis not present

## 2015-10-30 ENCOUNTER — Encounter: Payer: Self-pay | Admitting: Internal Medicine

## 2015-10-30 ENCOUNTER — Telehealth: Payer: Self-pay | Admitting: Internal Medicine

## 2015-10-30 DIAGNOSIS — E785 Hyperlipidemia, unspecified: Secondary | ICD-10-CM | POA: Diagnosis not present

## 2015-10-30 DIAGNOSIS — Z0110 Encounter for examination of ears and hearing without abnormal findings: Secondary | ICD-10-CM

## 2015-10-30 DIAGNOSIS — R609 Edema, unspecified: Secondary | ICD-10-CM | POA: Diagnosis not present

## 2015-10-30 DIAGNOSIS — E039 Hypothyroidism, unspecified: Secondary | ICD-10-CM | POA: Diagnosis not present

## 2015-10-30 LAB — LIPID PANEL
Cholesterol: 227 mg/dL — AB (ref 0–200)
HDL: 58 mg/dL (ref 35–70)
LDL Cholesterol: 128 mg/dL
TRIGLYCERIDES: 206 mg/dL — AB (ref 40–160)

## 2015-10-30 LAB — HEPATIC FUNCTION PANEL
ALT: 10 U/L (ref 7–35)
AST: 16 U/L (ref 13–35)
Alkaline Phosphatase: 62 U/L (ref 25–125)
BILIRUBIN, TOTAL: 0.4 mg/dL

## 2015-10-30 LAB — BASIC METABOLIC PANEL
BUN: 15 mg/dL (ref 4–21)
CREATININE: 1.2 mg/dL — AB (ref <=1.1)
Glucose: 101 mg/dL
Potassium: 4.4 mmol/L (ref 3.4–5.3)
SODIUM: 141 mmol/L (ref 137–147)

## 2015-10-30 LAB — TSH: TSH: 11.07 u[IU]/mL — AB (ref 0.41–5.90)

## 2015-10-30 NOTE — Telephone Encounter (Signed)
Patient called this afternoon and stated she has an appointment at [facility] tomorrow 10/31/15 and she received a call this morning informing her that she needs a referral from her PCP.  Please advise. Thank you

## 2015-10-30 NOTE — Telephone Encounter (Signed)
Refer as requested

## 2015-10-30 NOTE — Telephone Encounter (Signed)
Referral placed.

## 2015-10-31 DIAGNOSIS — H906 Mixed conductive and sensorineural hearing loss, bilateral: Secondary | ICD-10-CM | POA: Diagnosis not present

## 2015-11-01 ENCOUNTER — Other Ambulatory Visit: Payer: Self-pay | Admitting: *Deleted

## 2015-11-01 MED ORDER — LEVOTHYROXINE SODIUM 100 MCG PO TABS: 100.0000 ug | ORAL_TABLET | Freq: Every day | ORAL | 3 refills | Status: DC

## 2015-11-01 NOTE — Telephone Encounter (Signed)
Spoke with patient and Nurse(Tiffany) regarding medication change(levothyroxine ) increased to 100 mcg, script sent to [facility].

## 2015-11-07 DIAGNOSIS — L570 Actinic keratosis: Secondary | ICD-10-CM | POA: Diagnosis not present

## 2015-11-07 DIAGNOSIS — L821 Other seborrheic keratosis: Secondary | ICD-10-CM | POA: Diagnosis not present

## 2015-11-07 DIAGNOSIS — L814 Other melanin hyperpigmentation: Secondary | ICD-10-CM | POA: Diagnosis not present

## 2015-11-08 ENCOUNTER — Encounter: Payer: Self-pay | Admitting: Internal Medicine

## 2015-11-08 ENCOUNTER — Non-Acute Institutional Stay: Payer: Medicare Other | Admitting: Internal Medicine

## 2015-11-08 VITALS — BP 122/70 | HR 87 | Temp 98.6°F | Ht 61.0 in | Wt 146.0 lb

## 2015-11-08 DIAGNOSIS — E785 Hyperlipidemia, unspecified: Secondary | ICD-10-CM

## 2015-11-08 DIAGNOSIS — R609 Edema, unspecified: Secondary | ICD-10-CM | POA: Diagnosis not present

## 2015-11-08 DIAGNOSIS — E039 Hypothyroidism, unspecified: Secondary | ICD-10-CM | POA: Diagnosis not present

## 2015-11-08 DIAGNOSIS — Z23 Encounter for immunization: Secondary | ICD-10-CM | POA: Diagnosis not present

## 2015-11-08 NOTE — Progress Notes (Signed)
Facility  [facility]    Place of Service: Clinic (12)     Allergies  Allergen Reactions  . Mushroom Extract Complex   . Penicillins Diarrhea    Chief Complaint  Patient presents with  . Medical Management of Chronic Issues    6 month medication management thyroid, cholesterol, review labs    HPI:  Hypothyroidism, unspecified type - recent Elevation in TSh too 11. Was normal in March 2017. Lev  Hyperlipidemia, unspecified hyperlipidemia type - mild elevation in the HDL.   Edema, unspecified type - improved    Medications: Patient's Medications  New Prescriptions   No medications on file  Previous Medications   ASPIRIN 81 MG TABLET    Take 81 mg by mouth daily.   CALCIUM CARBONATE-VITAMIN D (CALCIUM 600+D) 600-200 MG-UNIT TABS    Take 1 tablet by mouth 2 (two) times daily.   CHOLECALCIFEROL (VITAMIN D) 1000 UNITS TABLET    Take 1,000 Units by mouth daily.   CYCLOBENZAPRINE (FLEXERIL) 5 MG TABLET    1 tablet 3 times daily as needed for muscle spasm   LEVOTHYROXINE (SYNTHROID, LEVOTHROID) 100 MCG TABLET    Take 1 tablet (100 mcg total) by mouth daily.   MULTIPLE VITAMIN (MULTIVITAMIN WITH MINERALS) TABS    Take 1 tablet by mouth daily.   NAPROXEN SODIUM (ANAPROX) 220 MG TABLET    Take 220 mg by mouth. As one as needed   OMEGA-3 FATTY ACIDS (FISH OIL) 1200 MG CAPS    Take 1 capsule by mouth daily.   POLYETHYLENE GLYCOL (MIRALAX / GLYCOLAX) PACKET    Take 17 g by mouth daily.   VITAMIN B-12 (CYANOCOBALAMIN) 100 MCG TABLET    Take 500 mcg by mouth daily.   Modified Medications   No medications on file  Discontinued Medications   ACETAMINOPHEN (TYLENOL) 650 MG CR TABLET    Take 650 mg by mouth every 8 (eight) hours as needed. pain   CIPROFLOXACIN (CIPRO) 250 MG TABLET    One twice daily for infection     Review of Systems  Constitutional: Negative for activity change, appetite change, chills, diaphoresis, fatigue, fever and unexpected weight change.  HENT: Positive for  hearing loss. Negative for congestion, ear pain, sore throat and tinnitus.   Eyes:       History of xerophthalmia.  Respiratory: Positive for cough. Negative for chest tightness and shortness of breath.   Cardiovascular: Negative for chest pain, palpitations and leg swelling.  Gastrointestinal: Positive for abdominal pain (Lower abdomen) and constipation (using Miralax prn). Negative for abdominal distention, diarrhea, nausea and vomiting.  Endocrine: Negative for cold intolerance, heat intolerance, polydipsia, polyphagia and polyuria.  Genitourinary: Positive for frequency.       Nocturia x2. Recurrent urinary leakage of small quantities of urine.  Musculoskeletal: Positive for back pain. Negative for arthralgias, gait problem, myalgias and neck pain.  Skin: Negative.   Allergic/Immunologic: Negative.   Neurological: Negative for dizziness, tremors, syncope, facial asymmetry, weakness, light-headedness and headaches.  Hematological: Negative for adenopathy. Does not bruise/bleed easily.  Psychiatric/Behavioral: Negative for agitation, behavioral problems, confusion and sleep disturbance. The patient is not nervous/anxious.     Vitals:   11/08/15 1456  BP: 122/70  Pulse: 87  Temp: 98.6 F (37 C)  TempSrc: Oral  SpO2: 96%  Weight: 146 lb (66.2 kg)  Height: '5\' 1"'  (1.549 m)   Wt Readings from Last 3 Encounters:  11/08/15 146 lb (66.2 kg)  05/24/15 150 lb (68 kg)  04/12/15 148 lb (67.1 kg)    Body mass index is 27.59 kg/m.  Physical Exam  Constitutional: She is oriented to person, place, and time. She appears well-developed and well-nourished. No distress.  HENT:  Head: Normocephalic and atraumatic.  Right Ear: External ear normal.  Left Ear: External ear normal.  Nose: Nose normal.  Mouth/Throat: Oropharynx is clear and moist.   Bilateral hearing loss  Eyes: Conjunctivae and EOM are normal. Pupils are equal, round, and reactive to light.  Corrective lenses  Neck: No JVD  present. No tracheal deviation present. No thyromegaly present.  Cardiovascular: Normal rate, regular rhythm, normal heart sounds and intact distal pulses.  Exam reveals no friction rub.   No murmur heard. No varicosities bilaterally.   Pulmonary/Chest: No respiratory distress. She has no wheezes. She has no rales.  Abdominal: She exhibits no distension and no mass. There is no tenderness.  Reducible Umbilical hernia. Active bowel sounds. No mass.  Musculoskeletal: Normal range of motion. She exhibits no edema or tenderness.  Bilateral knee scars from previous arthroscopic. Some crepitance in knees bilaterally. Low back discomfort in the lumbosacral area as well as sacroiliac. She is not tender in the flank or costovertebral angles.  Lymphadenopathy:    She has no cervical adenopathy.  Neurological: She is alert and oriented to person, place, and time. She has normal reflexes. No cranial nerve deficit. Coordination normal.  04/12/15 MMSE 30/30. Passed clock drawing.  Skin: No rash noted. No erythema. No pallor.  Psychiatric: She has a normal mood and affect. Her behavior is normal. Judgment and thought content normal.     Labs reviewed: Lab Summary Latest Ref Rng & Units 10/30/2015 04/03/2015 05/30/2014 11/28/2013  Hemoglobin 13.0-17.0 g/dL (None) (None) (None) (None)  Hematocrit 39.0-52.0 % (None) (None) (None) (None)  White count - (None) (None) (None) (None)  Platelet count - (None) (None) (None) (None)  Sodium 137 - 147 mmol/L 141 141 141 (None)  Potassium 3.4 - 5.3 mmol/L 4.4 4.3 4.3 (None)  Calcium - (None) (None) (None) (None)  Phosphorus - (None) (None) (None) (None)  Creatinine 0.5 - 1.1 mg/dL 1.2(A) 1.0 1.1 (None)  AST 13 - 35 U/L 16 16 (None) (None)  Alk Phos 25 - 125 U/L 62 61 (None) (None)  Bilirubin - (None) (None) (None) (None)  Glucose mg/dL 101 102 100 (None)  Cholesterol 0 - 200 mg/dL 227(A) 235(A) 215(A) 217(A)  HDL cholesterol 35 - 70 mg/dL 58 59 56 53  Triglycerides  40 - 160 mg/dL 206(A) 192(A) 158 191(A)  LDL Direct - (None) (None) (None) (None)  LDL Calc mg/dL 128 138 127 126  Total protein - (None) (None) (None) (None)  Albumin - (None) (None) (None) (None)  Some recent data might be hidden   Lab Results  Component Value Date   TSH 11.07 (A) 10/30/2015   Lab Results  Component Value Date   BUN 15 10/30/2015   BUN 19 04/03/2015   BUN 19 05/30/2014   Lab Results  Component Value Date   CREATININE 1.2 (A) 10/30/2015   CREATININE 1.0 04/03/2015   CREATININE 1.1 05/30/2014   No results found for: HGBA1C     Assessment/Plan  1. Hypothyroidism, unspecified type -continue levothyroxine 100 mcg qd. Repeat TSH in 6 weeks  2. Hyperlipidemia, unspecified hyperlipidemia type Stable and adequately controlled for age  82. Edema, unspecified type improved

## 2015-11-22 ENCOUNTER — Other Ambulatory Visit: Payer: Self-pay | Admitting: *Deleted

## 2015-11-22 DIAGNOSIS — E039 Hypothyroidism, unspecified: Secondary | ICD-10-CM

## 2015-12-05 DIAGNOSIS — L570 Actinic keratosis: Secondary | ICD-10-CM | POA: Diagnosis not present

## 2015-12-12 ENCOUNTER — Other Ambulatory Visit: Payer: Self-pay

## 2015-12-12 DIAGNOSIS — E039 Hypothyroidism, unspecified: Secondary | ICD-10-CM

## 2015-12-17 ENCOUNTER — Other Ambulatory Visit: Payer: Self-pay

## 2015-12-17 DIAGNOSIS — E039 Hypothyroidism, unspecified: Secondary | ICD-10-CM

## 2015-12-18 ENCOUNTER — Encounter: Payer: Self-pay | Admitting: Internal Medicine

## 2015-12-18 DIAGNOSIS — E039 Hypothyroidism, unspecified: Secondary | ICD-10-CM | POA: Diagnosis not present

## 2015-12-19 ENCOUNTER — Other Ambulatory Visit: Payer: Self-pay | Admitting: *Deleted

## 2015-12-19 DIAGNOSIS — E038 Other specified hypothyroidism: Secondary | ICD-10-CM

## 2015-12-19 LAB — TSH: TSH: 0.12 u[IU]/mL — AB (ref 0.41–5.90)

## 2015-12-25 ENCOUNTER — Telehealth: Payer: Self-pay

## 2015-12-25 NOTE — Telephone Encounter (Signed)
Received 12/18/15 lab results from [facility] today. TSH 0.12. Krista Bowman had  Signed it 12/19/15. Called patient left message for patient to call me about results. Krista Bowman wants to reduce Levothyroxine to 75 mcg, repeat TSH in 6 weeks, 02/05/16.

## 2015-12-25 NOTE — Telephone Encounter (Signed)
Spoke with patient decrease Levothyroxine to 75 mcg. (has some already). Repeat TSH 02/05/16. Review how and when she takes the Levothyroxine. This is find with her.

## 2015-12-27 ENCOUNTER — Other Ambulatory Visit: Payer: Self-pay | Admitting: *Deleted

## 2016-02-04 ENCOUNTER — Other Ambulatory Visit: Payer: Self-pay

## 2016-02-04 DIAGNOSIS — E038 Other specified hypothyroidism: Secondary | ICD-10-CM

## 2016-02-05 LAB — TSH: TSH: 1.45 m[IU]/L

## 2016-02-11 ENCOUNTER — Telehealth: Payer: Self-pay

## 2016-02-11 NOTE — Telephone Encounter (Signed)
Patient came by [facility] clinic to ask about 02/04/16 TSH, had not gotten report. TSH 1.45 (normal) on Synthroid 75 mcg. Continue same dose and repeat TSH prior to 05/08/16 appt per Dr. Nyoka Cowden. Lab 04/29/16.

## 2016-03-26 NOTE — Addendum Note (Signed)
Addended by: Ripley Fraise on: 03/26/2016 09:22 AM   Modules accepted: Orders

## 2016-04-28 DIAGNOSIS — E039 Hypothyroidism, unspecified: Secondary | ICD-10-CM | POA: Diagnosis not present

## 2016-04-29 ENCOUNTER — Other Ambulatory Visit: Payer: Medicare Other

## 2016-04-29 LAB — TSH: TSH: 1.85 mIU/L

## 2016-05-08 ENCOUNTER — Encounter: Payer: Self-pay | Admitting: Internal Medicine

## 2016-05-08 ENCOUNTER — Non-Acute Institutional Stay: Payer: Medicare Other | Admitting: Internal Medicine

## 2016-05-08 VITALS — BP 118/74 | HR 75 | Temp 98.3°F | Ht 61.0 in | Wt 144.0 lb

## 2016-05-08 DIAGNOSIS — R609 Edema, unspecified: Secondary | ICD-10-CM | POA: Diagnosis not present

## 2016-05-08 DIAGNOSIS — E039 Hypothyroidism, unspecified: Secondary | ICD-10-CM

## 2016-05-08 DIAGNOSIS — R29898 Other symptoms and signs involving the musculoskeletal system: Secondary | ICD-10-CM | POA: Diagnosis not present

## 2016-05-08 DIAGNOSIS — E785 Hyperlipidemia, unspecified: Secondary | ICD-10-CM | POA: Diagnosis not present

## 2016-05-08 NOTE — Progress Notes (Signed)
Facility  [facility]    Place of Service: Clinic (12)     Allergies  Allergen Reactions  . Mushroom Extract Complex   . Penicillins Diarrhea    Chief Complaint  Patient presents with  . Medical Management of Chronic Issues    6 month medication management thyroid, cholesterol, review lab  . Leg Pain    catch in upper right leg for one week, comes and goes    HPI:   Has noted a catch in the right upper thigh anteriorly. Occurred about 3 times since jan 2018. Quickly passes and is not very painful. Disturbs her balance. Nothing predictably causes the symptom. Denis numbness in the leg or foot. No weakness in the foot. No back pains or hip pains. Has not felt a lump ion the groin.  Hypothyroidism, unspecified type - compensated  Hyperlipidemia, unspecified hyperlipidemia type - controlled  Edema, unspecified type - controlled    Medications: Patient's Medications  New Prescriptions   No medications on file  Previous Medications   ASPIRIN 81 MG TABLET    Take 81 mg by mouth daily.   CALCIUM CARBONATE-VITAMIN D (CALCIUM 600+D) 600-200 MG-UNIT TABS    Take 1 tablet by mouth 2 (two) times daily.   CHOLECALCIFEROL (VITAMIN D) 1000 UNITS TABLET    Take 1,000 Units by mouth daily.   CYCLOBENZAPRINE (FLEXERIL) 5 MG TABLET    1 tablet 3 times daily as needed for muscle spasm   LEVOTHYROXINE (SYNTHROID, LEVOTHROID) 75 MCG TABLET    Take 75 mcg by mouth. Take one tablet in morning before breakfast   MULTIPLE VITAMIN (MULTIVITAMIN WITH MINERALS) TABS    Take 1 tablet by mouth daily.   NAPROXEN SODIUM (ANAPROX) 220 MG TABLET    Take 220 mg by mouth. As one as needed   OMEGA-3 FATTY ACIDS (FISH OIL) 1200 MG CAPS    Take 1 capsule by mouth daily.   POLYETHYLENE GLYCOL (MIRALAX / GLYCOLAX) PACKET    Take 17 g by mouth daily.   VITAMIN B-12 (CYANOCOBALAMIN) 100 MCG TABLET    Take 500 mcg by mouth daily.   Modified Medications   No medications on file  Discontinued Medications   No  medications on file     Review of Systems  Constitutional: Negative for activity change, appetite change, chills, diaphoresis, fatigue, fever and unexpected weight change.  HENT: Positive for hearing loss. Negative for congestion, ear pain, sore throat and tinnitus.   Eyes:       History of xerophthalmia.  Respiratory: Positive for cough. Negative for chest tightness and shortness of breath.   Cardiovascular: Negative for chest pain, palpitations and leg swelling.  Gastrointestinal: Positive for abdominal pain (Lower abdomen) and constipation (using Miralax prn). Negative for abdominal distention, diarrhea, nausea and vomiting.  Endocrine: Negative for cold intolerance, heat intolerance, polydipsia, polyphagia and polyuria.  Genitourinary: Positive for frequency.       Nocturia x2. Recurrent urinary leakage of small quantities of urine.  Musculoskeletal: Positive for back pain. Negative for arthralgias, gait problem, myalgias and neck pain.  Skin: Negative.   Allergic/Immunologic: Negative.   Neurological: Negative for dizziness, tremors, syncope, facial asymmetry, weakness, light-headedness and headaches.  Hematological: Negative for adenopathy. Does not bruise/bleed easily.  Psychiatric/Behavioral: Negative for agitation, behavioral problems, confusion and sleep disturbance. The patient is not nervous/anxious.     Vitals:   05/08/16 1544  BP: 118/74  Pulse: 75  Temp: 98.3 F (36.8 C)  TempSrc: Oral  SpO2: 96%  Weight: 144 lb (65.3 kg)  Height: 5' 1" (1.549 m)   Wt Readings from Last 3 Encounters:  05/08/16 144 lb (65.3 kg)  11/08/15 146 lb (66.2 kg)  05/24/15 150 lb (68 kg)    Body mass index is 27.21 kg/m.  Physical Exam  Constitutional: She is oriented to person, place, and time. She appears well-developed and well-nourished. No distress.  HENT:  Head: Normocephalic and atraumatic.  Right Ear: External ear normal.  Left Ear: External ear normal.  Nose: Nose  normal.  Mouth/Throat: Oropharynx is clear and moist.   Bilateral hearing loss  Eyes: Conjunctivae and EOM are normal. Pupils are equal, round, and reactive to light.  Corrective lenses  Neck: No JVD present. No tracheal deviation present. No thyromegaly present.  Cardiovascular: Normal rate, regular rhythm, normal heart sounds and intact distal pulses.  Exam reveals no friction rub.   No murmur heard. No varicosities bilaterally.   Pulmonary/Chest: No respiratory distress. She has no wheezes. She has no rales.  Abdominal: She exhibits no distension and no mass. There is no tenderness.  Reducible Umbilical hernia. Active bowel sounds. No mass.  Musculoskeletal: Normal range of motion. She exhibits no edema or tenderness.  Bilateral knee scars from previous arthroscopic. Some crepitance in knees bilaterally. Low back discomfort in the lumbosacral area as well as sacroiliac. She is not tender in the flank or costovertebral angles. No focal ain in the right upper thigh. Able to rise from chair unassisted, turn without hesitation, and hop, and squat and rise. Slightly weaker on the right side when she tries to hop. No foot drop.  Lymphadenopathy:    She has no cervical adenopathy.  Neurological: She is alert and oriented to person, place, and time. She has normal reflexes. No cranial nerve deficit. Coordination normal.  04/12/15 MMSE 30/30. Passed clock drawing.  Skin: No rash noted. No erythema. No pallor.  Psychiatric: She has a normal mood and affect. Her behavior is normal. Judgment and thought content normal.     Labs reviewed: Lab Summary Latest Ref Rng & Units 10/30/2015 04/03/2015 05/30/2014 11/28/2013  Hemoglobin 13.0-17.0 g/dL (None) (None) (None) (None)  Hematocrit 39.0-52.0 % (None) (None) (None) (None)  White count - (None) (None) (None) (None)  Platelet count - (None) (None) (None) (None)  Sodium 137 - 147 mmol/L 141 141 141 (None)  Potassium 3.4 - 5.3 mmol/L 4.4 4.3 4.3 (None)    Calcium - (None) (None) (None) (None)  Phosphorus - (None) (None) (None) (None)  Creatinine 0.5 - 1.1 mg/dL 1.2(A) 1.0 1.1 (None)  AST 13 - 35 U/L 16 16 (None) (None)  Alk Phos 25 - 125 U/L 62 61 (None) (None)  Bilirubin - (None) (None) (None) (None)  Glucose mg/dL 101 102 100 (None)  Cholesterol 0 - 200 mg/dL 227(A) 235(A) 215(A) 217(A)  HDL cholesterol 35 - 70 mg/dL 58 59 56 53  Triglycerides 40 - 160 mg/dL 206(A) 192(A) 158 191(A)  LDL Direct - (None) (None) (None) (None)  LDL Calc mg/dL 128 138 127 126  Total protein - (None) (None) (None) (None)  Albumin - (None) (None) (None) (None)  Some recent data might be hidden   Lab Results  Component Value Date   TSH 1.85 04/28/2016   Lab Results  Component Value Date   BUN 15 10/30/2015   BUN 19 04/03/2015   BUN 19 05/30/2014   Lab Results  Component Value Date   CREATININE 1.2 (A) 10/30/2015   CREATININE 1.0 04/03/2015   CREATININE  1.1 05/30/2014   No results found for: HGBA1C     Assessment/Plan  1. Hypothyroidism, unspecified type The current medical regimen is effective;  continue present plan and medications.  2. Hyperlipidemia, unspecified hyperlipidemia type The current medical regimen is effective;  continue present plan and medications.  3. Edema, unspecified type Controlled. Not on diuretic.  4. Right leg weakness I do not see a need for Xrays or physical therapy at this time. I suspect there is some muscular or tendonous injury to the right upper thigh that will heal.

## 2016-05-15 ENCOUNTER — Other Ambulatory Visit: Payer: Self-pay | Admitting: *Deleted

## 2016-05-15 DIAGNOSIS — E039 Hypothyroidism, unspecified: Secondary | ICD-10-CM

## 2016-05-15 MED ORDER — LEVOTHYROXINE SODIUM 75 MCG PO TABS: 75.0000 ug | ORAL_TABLET | Freq: Every day | ORAL | 3 refills | Status: DC

## 2016-05-26 ENCOUNTER — Other Ambulatory Visit: Payer: Self-pay | Admitting: *Deleted

## 2016-05-26 DIAGNOSIS — E039 Hypothyroidism, unspecified: Secondary | ICD-10-CM

## 2016-05-26 MED ORDER — LEVOTHYROXINE SODIUM 75 MCG PO TABS: ORAL_TABLET | ORAL | 1 refills | Status: DC

## 2016-05-26 NOTE — Telephone Encounter (Signed)
[employer] 

## 2016-06-04 DIAGNOSIS — H01004 Unspecified blepharitis left upper eyelid: Secondary | ICD-10-CM | POA: Diagnosis not present

## 2016-06-04 DIAGNOSIS — H04123 Dry eye syndrome of bilateral lacrimal glands: Secondary | ICD-10-CM | POA: Diagnosis not present

## 2016-06-04 DIAGNOSIS — H524 Presbyopia: Secondary | ICD-10-CM | POA: Diagnosis not present

## 2016-06-04 DIAGNOSIS — H01005 Unspecified blepharitis left lower eyelid: Secondary | ICD-10-CM | POA: Diagnosis not present

## 2016-06-04 DIAGNOSIS — H52223 Regular astigmatism, bilateral: Secondary | ICD-10-CM | POA: Diagnosis not present

## 2016-06-04 DIAGNOSIS — H35372 Puckering of macula, left eye: Secondary | ICD-10-CM | POA: Diagnosis not present

## 2016-06-04 DIAGNOSIS — H5213 Myopia, bilateral: Secondary | ICD-10-CM | POA: Diagnosis not present

## 2016-07-29 NOTE — Addendum Note (Signed)
Addended by: Royann Shivers A on: 07/29/2016 03:51 PM   Modules accepted: Orders

## 2016-08-19 ENCOUNTER — Other Ambulatory Visit: Payer: Self-pay

## 2016-08-19 ENCOUNTER — Non-Acute Institutional Stay: Payer: Medicare Other | Admitting: Internal Medicine

## 2016-08-19 ENCOUNTER — Encounter: Payer: Self-pay | Admitting: Internal Medicine

## 2016-08-19 VITALS — BP 116/60 | HR 72 | Temp 97.8°F | Resp 16 | Ht 61.0 in | Wt 141.2 lb

## 2016-08-19 DIAGNOSIS — E039 Hypothyroidism, unspecified: Secondary | ICD-10-CM | POA: Diagnosis not present

## 2016-08-19 DIAGNOSIS — R29898 Other symptoms and signs involving the musculoskeletal system: Secondary | ICD-10-CM

## 2016-08-19 DIAGNOSIS — G8929 Other chronic pain: Secondary | ICD-10-CM | POA: Diagnosis not present

## 2016-08-19 DIAGNOSIS — M545 Low back pain: Secondary | ICD-10-CM

## 2016-08-19 DIAGNOSIS — R739 Hyperglycemia, unspecified: Secondary | ICD-10-CM | POA: Diagnosis not present

## 2016-08-19 NOTE — Progress Notes (Signed)
Facility  [facility]    Place of Service: Clinic (12)     Allergies  Allergen Reactions  . Mushroom Extract Complex   . Penicillins Diarrhea    Chief Complaint  Patient presents with  . Acute Visit    Patient stated that she has the feeling that her right leg gives out at times. Patient hasn't had a fall. Patiient stated that her right leg has been giving out off and on for 2-3 weeks.  . Medication Refill    Patient doesn't need any refills at this time.  . Allergies    Possible new allergy to dust. Clarify on whether it should be added to her allergy list.   . Medication Management    Patient currently isnt taking her flexeril or miralax    HPI:   83 y/o pt here for acute visit.   Right leg weakness- She has been having this feeling of right leg giving away since January 2018. It has been more frequent recently with this happening several days a week but not every day. Denies any pain to hip or knee area. Has noted occasional cramps to her legs. When she has the feeling of her right leg giving out, she almost loses her balance and needs to hold to objects for support. No fall reported. She feels it to be her muscles. Denies any exacerbating factor.   Low back pain- She has occasional low back pain that is not radiating. naproxen helps. Prolonged sitting worsens it.  Denies any lump or mass to her groin area.    Medications: Patient's Medications  New Prescriptions   No medications on file  Previous Medications   ASPIRIN 81 MG TABLET    Take 81 mg by mouth daily.   CALCIUM CARBONATE-VITAMIN D (CALCIUM 600+D) 600-200 MG-UNIT TABS    Take 1 tablet by mouth 2 (two) times daily.   CHOLECALCIFEROL (VITAMIN D) 1000 UNITS TABLET    Take 1,000 Units by mouth daily.   LEVOTHYROXINE (SYNTHROID, LEVOTHROID) 75 MCG TABLET    Take one tablet by mouth once daily 30 minutes before breakfast for thyroid   MULTIPLE VITAMIN (MULTIVITAMIN WITH MINERALS) TABS    Take 1 tablet by mouth daily.   NAPROXEN SODIUM (ANAPROX) 220 MG TABLET    Take 220 mg by mouth. As one as needed   OMEGA-3 FATTY ACIDS (FISH OIL) 1200 MG CAPS    Take 1 capsule by mouth daily.   VITAMIN B-12 (CYANOCOBALAMIN) 100 MCG TABLET    Take 500 mcg by mouth daily.   Modified Medications   No medications on file  Discontinued Medications   CYCLOBENZAPRINE (FLEXERIL) 5 MG TABLET    1 tablet 3 times daily as needed for muscle spasm   POLYETHYLENE GLYCOL (MIRALAX / GLYCOLAX) PACKET    Take 17 g by mouth daily.     Review of Systems  Constitutional: Negative for activity change, appetite change, chills, diaphoresis, fatigue, fever and unexpected weight change.  HENT: Positive for congestion and hearing loss. Negative for ear pain, sore throat and tinnitus.   Eyes:       History of xerophthalmia.  Respiratory: Negative for cough and shortness of breath.   Cardiovascular: Positive for leg swelling. Negative for chest pain and palpitations.       Chronic leg edema, increases towards end of the day  Gastrointestinal: Positive for constipation (using Miralax prn). Negative for abdominal pain (Lower abdomen), diarrhea, nausea and vomiting.  Stopped miralax now. Takes yoghurt, fruits and prunes and this seems to help  Endocrine: Negative for cold intolerance, heat intolerance, polydipsia, polyphagia and polyuria.  Genitourinary: Positive for frequency.       Nocturia x2. Recurrent urinary leakage of small quantities of urine.  Musculoskeletal: Positive for back pain. Negative for arthralgias, gait problem, joint swelling, myalgias and neck pain.  Skin: Negative.  Negative for wound.  Allergic/Immunologic: Negative.   Neurological: Positive for weakness. Negative for dizziness, tremors, seizures, syncope, light-headedness, numbness and headaches.  Hematological: Does not bruise/bleed easily.  Psychiatric/Behavioral: Negative for behavioral problems and sleep disturbance. The patient is not nervous/anxious.      Vitals:   08/19/16 0818  BP: 116/60  Pulse: 72  Resp: 16  Temp: 97.8 F (36.6 C)  TempSrc: Oral  SpO2: 97%  Weight: 141 lb 3.2 oz (64 kg)  Height: '5\' 1"'  (1.549 m)   Wt Readings from Last 3 Encounters:  08/19/16 141 lb 3.2 oz (64 kg)  05/08/16 144 lb (65.3 kg)  11/08/15 146 lb (66.2 kg)    Body mass index is 26.68 kg/m.  Physical Exam  Constitutional: She is oriented to person, place, and time. She appears well-developed and well-nourished. No distress.  HENT:  Head: Normocephalic and atraumatic.  Nose: Nose normal.  Mouth/Throat: Oropharynx is clear and moist.   Bilateral hearing loss  Eyes: Pupils are equal, round, and reactive to light. Conjunctivae and EOM are normal. Right eye exhibits no discharge. Left eye exhibits discharge.  Corrective lenses  Neck: No JVD present. No tracheal deviation present. No thyromegaly present.  Cardiovascular: Normal rate, regular rhythm, normal heart sounds and intact distal pulses.  Exam reveals no friction rub.   No murmur heard. No varicosities bilaterally.   Pulmonary/Chest: No respiratory distress. She has no wheezes. She has no rales.  Abdominal: She exhibits no distension and no mass. There is no tenderness. There is no rebound.  Reducible Umbilical hernia. Active bowel sounds. No mass.  Musculoskeletal: Normal range of motion. She exhibits edema. She exhibits no tenderness.  left knee scar from previous arthroscopic. Crepitus to both knees. Low back discomfort in the lumbosacral area. No spasticity noted. Able to rise from chair unassisted, turn, squat and rise without discomfort or unsteadiness. No foot drop. Kyphosis present. Strength 5/5 in all extremites  Lymphadenopathy:    She has no cervical adenopathy.  Neurological: She is alert and oriented to person, place, and time. No cranial nerve deficit. Coordination normal.  04/12/15 MMSE 30/30. Passed clock drawing.  Skin: No rash noted. No erythema. No pallor.   Psychiatric: She has a normal mood and affect. Her behavior is normal. Judgment and thought content normal.     Labs reviewed:  CBC Latest Ref Rng & Units 01/22/2012 01/04/2008 12/31/2006  WBC 4.0 - 10.5 K/uL 7.4 5.7 5.2  Hemoglobin 12.0 - 15.0 g/dL 12.4 12.8 12.6  Hematocrit 36.0 - 46.0 % 37.1 39.1 37.2  Platelets 150 - 400 K/uL 305 312 326   Lab Results  Component Value Date   TSH 1.85 04/28/2016   CMP Latest Ref Rng & Units 10/30/2015 04/03/2015 05/30/2014  Glucose 70 - 99 mg/dL - - -  BUN 4 - 21 mg/dL '15 19 19  ' Creatinine 0.5 - 1.1 mg/dL 1.2(A) 1.0 1.1  Sodium 137 - 147 mmol/L 141 141 141  Potassium 3.4 - 5.3 mmol/L 4.4 4.3 4.3  Chloride 96 - 112 mEq/L - - -  CO2 19 - 32 mEq/L - - -  Calcium 8.4 - 10.5 mg/dL - - -  Total Protein 6.0 - 8.3 g/dL - - -  Total Bilirubin 0.3 - 1.2 mg/dL - - -  Alkaline Phos 25 - 125 U/L 62 61 -  AST 13 - 35 U/L 16 16 -  ALT 7 - 35 U/L 10 14 -      Assessment/Plan  1. Weakness of right lower extremity Appears to be muscular. Another possibility is from progressive right knee OA. Strength adequate on exam and gait steady this visit. Since this happens intermittently, has been there since jan, obtain xray of right knee to assess for severe OA changes.  - right knee xray  - CMP with eGFR; Future  2. Chronic midline low back pain without sciatica Possible stenosis contributing to intermittent weakness to right leg with unsteady gait at times. Obtain xray of lumbosacral spine.   3. Hyperglycemia Before next visit - Hemoglobin A1c; Future  4. Acquired hypothyroidism - CMP with eGFR; Future - CBC with Differential/Platelets; Future - Lipid Panel; Future - Hemoglobin A1c; Future - TSH; Future  Next appt- 3 months RV  Blanchie Serve, MD Internal Medicine [facility] [address] [place], [place] [address] Cell Phone (Monday-Friday 8 am - 5 pm): [phone] On Call: [phone] and follow prompts after  5 pm and on weekends Office Phone: [phone] Office Fax: [phone]

## 2016-08-22 ENCOUNTER — Other Ambulatory Visit: Payer: Self-pay

## 2016-08-26 ENCOUNTER — Ambulatory Visit
Admission: RE | Admit: 2016-08-26 | Discharge: 2016-08-26 | Disposition: A | Discharge status: Home / Self Care | Payer: Medicare Other | Source: Ambulatory Visit | Attending: Internal Medicine | Admitting: Internal Medicine

## 2016-08-26 DIAGNOSIS — M545 Low back pain, unspecified: Secondary | ICD-10-CM

## 2016-08-26 DIAGNOSIS — M47816 Spondylosis without myelopathy or radiculopathy, lumbar region: Secondary | ICD-10-CM | POA: Diagnosis not present

## 2016-08-26 DIAGNOSIS — R29898 Other symptoms and signs involving the musculoskeletal system: Secondary | ICD-10-CM

## 2016-08-26 DIAGNOSIS — M25561 Pain in right knee: Secondary | ICD-10-CM | POA: Diagnosis not present

## 2016-08-26 DIAGNOSIS — G8929 Other chronic pain: Secondary | ICD-10-CM

## 2016-09-02 DIAGNOSIS — M2550 Pain in unspecified joint: Secondary | ICD-10-CM | POA: Diagnosis not present

## 2016-09-02 DIAGNOSIS — R011 Cardiac murmur, unspecified: Secondary | ICD-10-CM | POA: Diagnosis not present

## 2016-09-02 DIAGNOSIS — E559 Vitamin D deficiency, unspecified: Secondary | ICD-10-CM | POA: Diagnosis not present

## 2016-09-02 DIAGNOSIS — R609 Edema, unspecified: Secondary | ICD-10-CM | POA: Diagnosis not present

## 2016-09-02 DIAGNOSIS — M545 Low back pain: Secondary | ICD-10-CM | POA: Diagnosis not present

## 2016-09-02 DIAGNOSIS — M810 Age-related osteoporosis without current pathological fracture: Secondary | ICD-10-CM | POA: Diagnosis not present

## 2016-09-02 DIAGNOSIS — K5900 Constipation, unspecified: Secondary | ICD-10-CM | POA: Diagnosis not present

## 2016-09-02 DIAGNOSIS — M6281 Muscle weakness (generalized): Secondary | ICD-10-CM | POA: Diagnosis not present

## 2016-09-02 DIAGNOSIS — M1990 Unspecified osteoarthritis, unspecified site: Secondary | ICD-10-CM | POA: Diagnosis not present

## 2016-09-02 DIAGNOSIS — R2681 Unsteadiness on feet: Secondary | ICD-10-CM | POA: Diagnosis not present

## 2016-09-02 DIAGNOSIS — Z89421 Acquired absence of other right toe(s): Secondary | ICD-10-CM | POA: Diagnosis not present

## 2016-09-02 DIAGNOSIS — M549 Dorsalgia, unspecified: Secondary | ICD-10-CM | POA: Diagnosis not present

## 2016-09-04 DIAGNOSIS — R2681 Unsteadiness on feet: Secondary | ICD-10-CM | POA: Diagnosis not present

## 2016-09-04 DIAGNOSIS — M1990 Unspecified osteoarthritis, unspecified site: Secondary | ICD-10-CM | POA: Diagnosis not present

## 2016-09-04 DIAGNOSIS — M549 Dorsalgia, unspecified: Secondary | ICD-10-CM | POA: Diagnosis not present

## 2016-09-04 DIAGNOSIS — M545 Low back pain: Secondary | ICD-10-CM | POA: Diagnosis not present

## 2016-09-04 DIAGNOSIS — M2550 Pain in unspecified joint: Secondary | ICD-10-CM | POA: Diagnosis not present

## 2016-09-04 DIAGNOSIS — M6281 Muscle weakness (generalized): Secondary | ICD-10-CM | POA: Diagnosis not present

## 2016-09-05 DIAGNOSIS — M6281 Muscle weakness (generalized): Secondary | ICD-10-CM | POA: Diagnosis not present

## 2016-09-05 DIAGNOSIS — M1990 Unspecified osteoarthritis, unspecified site: Secondary | ICD-10-CM | POA: Diagnosis not present

## 2016-09-05 DIAGNOSIS — M549 Dorsalgia, unspecified: Secondary | ICD-10-CM | POA: Diagnosis not present

## 2016-09-05 DIAGNOSIS — M545 Low back pain: Secondary | ICD-10-CM | POA: Diagnosis not present

## 2016-09-05 DIAGNOSIS — M2550 Pain in unspecified joint: Secondary | ICD-10-CM | POA: Diagnosis not present

## 2016-09-05 DIAGNOSIS — R2681 Unsteadiness on feet: Secondary | ICD-10-CM | POA: Diagnosis not present

## 2016-09-08 ENCOUNTER — Other Ambulatory Visit: Payer: Self-pay | Admitting: *Deleted

## 2016-09-08 DIAGNOSIS — M1990 Unspecified osteoarthritis, unspecified site: Secondary | ICD-10-CM | POA: Diagnosis not present

## 2016-09-08 DIAGNOSIS — M549 Dorsalgia, unspecified: Secondary | ICD-10-CM | POA: Diagnosis not present

## 2016-09-08 DIAGNOSIS — M2550 Pain in unspecified joint: Secondary | ICD-10-CM | POA: Diagnosis not present

## 2016-09-08 DIAGNOSIS — R2681 Unsteadiness on feet: Secondary | ICD-10-CM | POA: Diagnosis not present

## 2016-09-08 DIAGNOSIS — M545 Low back pain: Secondary | ICD-10-CM | POA: Diagnosis not present

## 2016-09-08 DIAGNOSIS — M6281 Muscle weakness (generalized): Secondary | ICD-10-CM | POA: Diagnosis not present

## 2016-09-10 DIAGNOSIS — M1990 Unspecified osteoarthritis, unspecified site: Secondary | ICD-10-CM | POA: Diagnosis not present

## 2016-09-10 DIAGNOSIS — M6281 Muscle weakness (generalized): Secondary | ICD-10-CM | POA: Diagnosis not present

## 2016-09-10 DIAGNOSIS — M549 Dorsalgia, unspecified: Secondary | ICD-10-CM | POA: Diagnosis not present

## 2016-09-10 DIAGNOSIS — M2550 Pain in unspecified joint: Secondary | ICD-10-CM | POA: Diagnosis not present

## 2016-09-10 DIAGNOSIS — M545 Low back pain: Secondary | ICD-10-CM | POA: Diagnosis not present

## 2016-09-10 DIAGNOSIS — R2681 Unsteadiness on feet: Secondary | ICD-10-CM | POA: Diagnosis not present

## 2016-09-12 DIAGNOSIS — R2681 Unsteadiness on feet: Secondary | ICD-10-CM | POA: Diagnosis not present

## 2016-09-12 DIAGNOSIS — M549 Dorsalgia, unspecified: Secondary | ICD-10-CM | POA: Diagnosis not present

## 2016-09-12 DIAGNOSIS — M2550 Pain in unspecified joint: Secondary | ICD-10-CM | POA: Diagnosis not present

## 2016-09-12 DIAGNOSIS — M1990 Unspecified osteoarthritis, unspecified site: Secondary | ICD-10-CM | POA: Diagnosis not present

## 2016-09-12 DIAGNOSIS — M545 Low back pain: Secondary | ICD-10-CM | POA: Diagnosis not present

## 2016-09-12 DIAGNOSIS — M6281 Muscle weakness (generalized): Secondary | ICD-10-CM | POA: Diagnosis not present

## 2016-09-15 DIAGNOSIS — M549 Dorsalgia, unspecified: Secondary | ICD-10-CM | POA: Diagnosis not present

## 2016-09-15 DIAGNOSIS — R2681 Unsteadiness on feet: Secondary | ICD-10-CM | POA: Diagnosis not present

## 2016-09-15 DIAGNOSIS — M2550 Pain in unspecified joint: Secondary | ICD-10-CM | POA: Diagnosis not present

## 2016-09-15 DIAGNOSIS — M1990 Unspecified osteoarthritis, unspecified site: Secondary | ICD-10-CM | POA: Diagnosis not present

## 2016-09-15 DIAGNOSIS — M545 Low back pain: Secondary | ICD-10-CM | POA: Diagnosis not present

## 2016-09-15 DIAGNOSIS — M6281 Muscle weakness (generalized): Secondary | ICD-10-CM | POA: Diagnosis not present

## 2016-09-17 DIAGNOSIS — R2681 Unsteadiness on feet: Secondary | ICD-10-CM | POA: Diagnosis not present

## 2016-09-17 DIAGNOSIS — M545 Low back pain: Secondary | ICD-10-CM | POA: Diagnosis not present

## 2016-09-17 DIAGNOSIS — M2550 Pain in unspecified joint: Secondary | ICD-10-CM | POA: Diagnosis not present

## 2016-09-17 DIAGNOSIS — M6281 Muscle weakness (generalized): Secondary | ICD-10-CM | POA: Diagnosis not present

## 2016-09-17 DIAGNOSIS — M549 Dorsalgia, unspecified: Secondary | ICD-10-CM | POA: Diagnosis not present

## 2016-09-17 DIAGNOSIS — M1990 Unspecified osteoarthritis, unspecified site: Secondary | ICD-10-CM | POA: Diagnosis not present

## 2016-09-19 DIAGNOSIS — M1990 Unspecified osteoarthritis, unspecified site: Secondary | ICD-10-CM | POA: Diagnosis not present

## 2016-09-19 DIAGNOSIS — M6281 Muscle weakness (generalized): Secondary | ICD-10-CM | POA: Diagnosis not present

## 2016-09-19 DIAGNOSIS — M2550 Pain in unspecified joint: Secondary | ICD-10-CM | POA: Diagnosis not present

## 2016-09-19 DIAGNOSIS — R2681 Unsteadiness on feet: Secondary | ICD-10-CM | POA: Diagnosis not present

## 2016-09-19 DIAGNOSIS — M549 Dorsalgia, unspecified: Secondary | ICD-10-CM | POA: Diagnosis not present

## 2016-09-19 DIAGNOSIS — M545 Low back pain: Secondary | ICD-10-CM | POA: Diagnosis not present

## 2016-09-24 DIAGNOSIS — R2681 Unsteadiness on feet: Secondary | ICD-10-CM | POA: Diagnosis not present

## 2016-09-24 DIAGNOSIS — M1990 Unspecified osteoarthritis, unspecified site: Secondary | ICD-10-CM | POA: Diagnosis not present

## 2016-09-24 DIAGNOSIS — M2550 Pain in unspecified joint: Secondary | ICD-10-CM | POA: Diagnosis not present

## 2016-09-24 DIAGNOSIS — M549 Dorsalgia, unspecified: Secondary | ICD-10-CM | POA: Diagnosis not present

## 2016-09-24 DIAGNOSIS — M545 Low back pain: Secondary | ICD-10-CM | POA: Diagnosis not present

## 2016-09-24 DIAGNOSIS — M6281 Muscle weakness (generalized): Secondary | ICD-10-CM | POA: Diagnosis not present

## 2016-10-21 DIAGNOSIS — R739 Hyperglycemia, unspecified: Secondary | ICD-10-CM | POA: Diagnosis not present

## 2016-10-21 DIAGNOSIS — R29898 Other symptoms and signs involving the musculoskeletal system: Secondary | ICD-10-CM | POA: Diagnosis not present

## 2016-10-21 DIAGNOSIS — E039 Hypothyroidism, unspecified: Secondary | ICD-10-CM | POA: Diagnosis not present

## 2016-10-22 LAB — LIPID PANEL
Cholesterol: 248 mg/dL — ABNORMAL HIGH (ref <200)
HDL: 67 mg/dL (ref >50)
LDL Cholesterol (Calc): 149 mg/dL (calc) — ABNORMAL HIGH
NON-HDL CHOLESTEROL (CALC): 181 mg/dL — AB (ref <130)
Total CHOL/HDL Ratio: 3.7 (calc) (ref <5.0)
Triglycerides: 187 mg/dL — ABNORMAL HIGH (ref <150)

## 2016-10-22 LAB — COMPLETE METABOLIC PANEL WITH GFR
AG RATIO: 1.5 (calc) (ref 1.0–2.5)
ALBUMIN MSPROF: 4.2 g/dL (ref 3.6–5.1)
ALKALINE PHOSPHATASE (APISO): 57 U/L (ref 33–130)
ALT: 10 U/L (ref 6–29)
AST: 15 U/L (ref 10–35)
BILIRUBIN TOTAL: 0.5 mg/dL (ref 0.2–1.2)
BUN / CREAT RATIO: 17 (calc) (ref 6–22)
BUN: 19 mg/dL (ref 7–25)
CHLORIDE: 105 mmol/L (ref 98–110)
CO2: 27 mmol/L (ref 20–32)
Calcium: 9.6 mg/dL (ref 8.6–10.4)
Creat: 1.13 mg/dL — ABNORMAL HIGH (ref 0.60–0.88)
GFR, EST AFRICAN AMERICAN: 52 mL/min/{1.73_m2} — AB (ref >=60)
GFR, Est Non African American: 45 mL/min/{1.73_m2} — ABNORMAL LOW (ref >=60)
GLOBULIN: 2.8 g/dL (ref 1.9–3.7)
GLUCOSE: 104 mg/dL — AB (ref 65–99)
POTASSIUM: 4.4 mmol/L (ref 3.5–5.3)
SODIUM: 141 mmol/L (ref 135–146)
TOTAL PROTEIN: 7 g/dL (ref 6.1–8.1)

## 2016-10-22 LAB — CBC WITH DIFFERENTIAL/PLATELET
BASOS ABS: 18 {cells}/uL (ref 0–200)
Basophils Relative: 0.3 %
EOS PCT: 6.1 %
Eosinophils Absolute: 360 cells/uL (ref 15–500)
HEMATOCRIT: 39.6 % (ref 35.0–45.0)
Hemoglobin: 13.2 g/dL (ref 11.7–15.5)
Lymphs Abs: 2207 cells/uL (ref 850–3900)
MCH: 29.5 pg (ref 27.0–33.0)
MCHC: 33.3 g/dL (ref 32.0–36.0)
MCV: 88.4 fL (ref 80.0–100.0)
MPV: 10 fL (ref 7.5–12.5)
Monocytes Relative: 6.6 %
NEUTROS PCT: 49.6 %
Neutro Abs: 2926 cells/uL (ref 1500–7800)
PLATELETS: 359 10*3/uL (ref 140–400)
RBC: 4.48 10*6/uL (ref 3.80–5.10)
RDW: 13 % (ref 11.0–15.0)
TOTAL LYMPHOCYTE: 37.4 %
WBC mixed population: 389 cells/uL (ref 200–950)
WBC: 5.9 10*3/uL (ref 3.8–10.8)

## 2016-10-22 LAB — TSH: TSH: 3.24 m[IU]/L (ref 0.40–4.50)

## 2016-10-22 LAB — HEMOGLOBIN A1C
Hgb A1c MFr Bld: 5.7 % of total Hgb — ABNORMAL HIGH (ref <5.7)
MEAN PLASMA GLUCOSE: 117 (calc)
eAG (mmol/L): 6.5 (calc)

## 2016-10-28 ENCOUNTER — Non-Acute Institutional Stay: Payer: Medicare Other | Admitting: Internal Medicine

## 2016-10-28 ENCOUNTER — Encounter: Payer: Self-pay | Admitting: Internal Medicine

## 2016-10-28 VITALS — BP 118/64 | HR 71 | Temp 97.6°F | Resp 18 | Ht 61.0 in | Wt 139.0 lb

## 2016-10-28 DIAGNOSIS — E2839 Other primary ovarian failure: Secondary | ICD-10-CM

## 2016-10-28 DIAGNOSIS — R600 Localized edema: Secondary | ICD-10-CM | POA: Insufficient documentation

## 2016-10-28 DIAGNOSIS — G8929 Other chronic pain: Secondary | ICD-10-CM

## 2016-10-28 DIAGNOSIS — N183 Chronic kidney disease, stage 3 unspecified: Secondary | ICD-10-CM | POA: Insufficient documentation

## 2016-10-28 DIAGNOSIS — E559 Vitamin D deficiency, unspecified: Secondary | ICD-10-CM

## 2016-10-28 DIAGNOSIS — E039 Hypothyroidism, unspecified: Secondary | ICD-10-CM

## 2016-10-28 DIAGNOSIS — M545 Low back pain: Secondary | ICD-10-CM | POA: Diagnosis not present

## 2016-10-28 DIAGNOSIS — E785 Hyperlipidemia, unspecified: Secondary | ICD-10-CM

## 2016-10-28 DIAGNOSIS — R7303 Prediabetes: Secondary | ICD-10-CM | POA: Diagnosis not present

## 2016-10-28 DIAGNOSIS — E538 Deficiency of other specified B group vitamins: Secondary | ICD-10-CM | POA: Diagnosis not present

## 2016-10-28 DIAGNOSIS — Z8739 Personal history of other diseases of the musculoskeletal system and connective tissue: Secondary | ICD-10-CM | POA: Diagnosis not present

## 2016-10-28 DIAGNOSIS — N1831 Chronic kidney disease, stage 3a: Secondary | ICD-10-CM | POA: Insufficient documentation

## 2016-10-28 NOTE — Progress Notes (Signed)
Facility  [facility]    Place of Service: Clinic (12)     Allergies  Allergen Reactions  . Mushroom Extract Complex   . Penicillins Diarrhea    Chief Complaint  Patient presents with  . Medical Management of Chronic Issues    3 month follow up. Patient wants to know why she's taking fish oil.  . Medication Refill    No refills needed at this time.  Marland Kitchen Results    Discuss labs    HPI:   83 y/o pt here for routine visit. She has blood work from 1 week back that has resulted.   Hypothyroidism- tolerating levothyroxine well. Bowel movement has been regular.   b12 deficiency- on b12 supplement  Low back pain- stable, working with therapy team. Takes naproxen rarely. On ca-vit d supplement.   Unclear why she takes fish oil. On chart review she has HLD but pt unaware about it.   Medications: Patient's Medications  New Prescriptions   No medications on file  Previous Medications   ASPIRIN 81 MG TABLET    Take 81 mg by mouth daily.   CALCIUM CARBONATE-VITAMIN D (CALCIUM 600+D) 600-200 MG-UNIT TABS    Take 1 tablet by mouth 2 (two) times daily.   CHOLECALCIFEROL (VITAMIN D) 1000 UNITS TABLET    Take 1,000 Units by mouth daily.   LEVOTHYROXINE (SYNTHROID, LEVOTHROID) 75 MCG TABLET    Take one tablet by mouth once daily 30 minutes before breakfast for thyroid   MULTIPLE VITAMIN (MULTIVITAMIN WITH MINERALS) TABS    Take 1 tablet by mouth daily.   NAPROXEN SODIUM (ANAPROX) 220 MG TABLET    Take 220 mg by mouth as needed.    OMEGA-3 FATTY ACIDS (FISH OIL) 1200 MG CAPS    Take 1 capsule by mouth daily.   VITAMIN B-12 (CYANOCOBALAMIN) 100 MCG TABLET    Take 500 mcg by mouth daily.   Modified Medications   No medications on file  Discontinued Medications   No medications on file     Review of Systems  Constitutional: Negative for appetite change, chills, diaphoresis, fatigue, fever and unexpected weight change.  HENT: Positive for congestion, hearing loss and  rhinorrhea. Negative for ear discharge, ear pain, mouth sores, nosebleeds, sinus pain, sinus pressure, sore throat, tinnitus and trouble swallowing.   Eyes: Positive for visual disturbance.       History of xerophthalmia. Wears corrective glasses. Has had cataract surgery  Respiratory: Negative for cough, choking, shortness of breath and wheezing.   Cardiovascular: Positive for leg swelling. Negative for chest pain and palpitations.       Chronic leg edema, increases towards end of the day  Gastrointestinal: Positive for constipation (using Miralax prn). Negative for abdominal pain (Lower abdomen), blood in stool, diarrhea, nausea, rectal pain and vomiting.       Takes yoghurt, fruits and prunes for bowel movement  Endocrine: Positive for cold intolerance. Negative for heat intolerance, polydipsia, polyphagia and polyuria.  Genitourinary: Positive for frequency. Negative for dysuria, flank pain, hematuria and pelvic pain.       Nocturia x3. Recurrent urinary leakage of small quantities of urine.  Musculoskeletal: Positive for back pain. Negative for arthralgias, gait problem, joint swelling, myalgias and neck pain.       No fall reported  Skin: Negative for rash and wound.  Allergic/Immunologic: Positive for environmental allergies.  Neurological: Positive for weakness. Negative for dizziness, tremors, seizures, syncope, light-headedness, numbness and headaches.  Hematological: Does not  bruise/bleed easily.  Psychiatric/Behavioral: Negative for behavioral problems, dysphoric mood and sleep disturbance. The patient is not nervous/anxious.     Vitals:   10/28/16 0833  BP: 118/64  Pulse: 71  Resp: 18  Temp: 97.6 F (36.4 C)  TempSrc: Oral  SpO2: 96%  Weight: 139 lb (63 kg)  Height: 5' 1" (1.549 m)   Wt Readings from Last 3 Encounters:  10/28/16 139 lb (63 kg)  08/19/16 141 lb 3.2 oz (64 kg)  05/08/16 144 lb (65.3 kg)    Body mass index is 26.26 kg/m.  Physical Exam    Constitutional: She is oriented to person, place, and time. She appears well-developed and well-nourished. No distress.  HENT:  Head: Normocephalic and atraumatic.  Right Ear: External ear normal.  Left Ear: External ear normal.  Nose: Nose normal.  Mouth/Throat: Oropharynx is clear and moist. No oropharyngeal exudate.  Hearing aid present. Bilateral hearing loss  Eyes: Pupils are equal, round, and reactive to light. Conjunctivae and EOM are normal. Right eye exhibits no discharge. Left eye exhibits discharge.  Corrective lenses  Neck: Normal range of motion. Neck supple. No JVD present. No tracheal deviation present. No thyromegaly present.  Cardiovascular: Normal rate, regular rhythm and intact distal pulses.  Exam reveals no friction rub.   Murmur heard. Pulmonary/Chest: Effort normal and breath sounds normal. No respiratory distress. She has no wheezes. She has no rales.  Abdominal: Soft. Bowel sounds are normal. She exhibits no distension and no mass. There is no tenderness. There is no rebound and no guarding.  Reducible Umbilical hernia. Active bowel sounds. No mass.  Musculoskeletal: Normal range of motion. She exhibits edema. She exhibits no tenderness.  left knee scar from previous arthroscopic. Crepitus to both knees. Low back discomfort in the lumbosacral area. No spasticity noted. Able to rise from chair unassisted, turn, squat and rise without discomfort or unsteadiness. No foot drop. Kyphosis present. Strength 5/5 in all extremites  Lymphadenopathy:    She has no cervical adenopathy.  Neurological: She is alert and oriented to person, place, and time. No cranial nerve deficit. Coordination normal.  04/12/15 MMSE 30/30. Passed clock drawing.  Skin: No rash noted. She is not diaphoretic. No erythema. No pallor.  Psychiatric: She has a normal mood and affect. Her behavior is normal. Judgment and thought content normal.   Depression screen PHQ 2/9 10/28/2016  Decreased Interest  0  Down, Depressed, Hopeless 0  PHQ - 2 Score 0  Altered sleeping 0  Tired, decreased energy 0  Change in appetite 0  Feeling bad or failure about yourself  0  Trouble concentrating 0  Moving slowly or fidgety/restless 0  Suicidal thoughts 0  PHQ-9 Score 0    Labs reviewed:  CBC Latest Ref Rng & Units 10/21/2016 01/22/2012 01/04/2008  WBC 3.8 - 10.8 Thousand/uL 5.9 7.4 5.7  Hemoglobin 11.7 - 15.5 g/dL 13.2 12.4 12.8  Hematocrit 35.0 - 45.0 % 39.6 37.1 39.1  Platelets 140 - 400 Thousand/uL 359 305 312   CMP Latest Ref Rng & Units 10/21/2016 10/30/2015 04/03/2015  Glucose 65 - 99 mg/dL 104(H) - -  BUN 7 - 25 mg/dL 19 15 19  Creatinine 0.60 - 0.88 mg/dL 1.13(H) 1.2(A) 1.0  Sodium 135 - 146 mmol/L 141 141 141  Potassium 3.5 - 5.3 mmol/L 4.4 4.4 4.3  Chloride 98 - 110 mmol/L 105 - -  CO2 20 - 32 mmol/L 27 - -  Calcium 8.6 - 10.4 mg/dL 9.6 - -  Total   Protein 6.1 - 8.1 g/dL 7.0 - -  Total Bilirubin 0.2 - 1.2 mg/dL 0.5 - -  Alkaline Phos 25 - 125 U/L - 62 61  AST 10 - 35 U/L _0 ALT 6 - 29 U/L _1 Lab Results  Component Value Date   HGBA1C 5.7 (H) 10/21/2016   Lab Results  Component Value Date   TSH 3.24 10/21/2016   Lipid Panel     Component Value Date/Time   CHOL 248 (H) 10/21/2016 0000   TRIG 187 (H) 10/21/2016 0000   HDL 67 10/21/2016 0000   CHOLHDL 3.7 10/21/2016 0000   LDLCALC 128 10/30/2015    Assessment/Plan  1. Acquired hypothyroidism Stable tsh. Continue levothyroxine - TSH; Future - Vitamin B12; Future - Vitamin D, 25-hydroxy; Future  2. Hyperlipidemia, unspecified hyperlipidemia type Continue fish oil for now. Pt would like to try dietary modification first. Recheck lab in 3 month and consider statin if indicated.  - Lipid Panel; Future - Vitamin D, 25-hydroxy; Future  3. Chronic midline low back pain without sciatica Stable at present with therapy exercise and prn naproxen. Given her renal function, d/c naproxen. Advised to avoid nsaids.  To take tylenol as needed for pain for now. Back precautions  4. Prediabetes Dietary counselling provided. Cut down on sweets, processed food. Add more serving of fresh fruits and vegetables. Pt agrees. Continue with walking and swimming for exercise.  - Lipid Panel; Future - Hemoglobin A1c; Future  5. CKD (chronic kidney disease) stage 3, GFR 30-59 ml/min (HCC) Monitor renal function - CMP with eGFR; Future - Lipid Panel; Future  6. Bilateral leg edema With PVD, to keep legs elevated at rest.   7. History of osteoporosis Mentions being on medication for osteoporosis before. Not on any at present. Obtain dexa scan. Continue calcium and vitamin d supplement for now - DG Bone Density; Future - Vitamin D, 25-hydroxy; Future  8. Vitamin B12 deficiency Continue b12 supplement, stable h&h. Check b12 - Vitamin B12; Future  9. Vitamin D deficiency Continue vit d supplement - Vitamin D, 25-hydroxy; Future  10. Estrogen deficiency dexa scan. Fall precautions - DG Bone Density; Future  Next appt- 3 months RV, MMSE, EKG  Blanchie Serve, MD Internal Medicine [facility] [address] [place], [place] [address] Cell Phone (Monday-Friday 8 am - 5 pm): [phone] On Call: [phone] and follow prompts after 5 pm and on weekends Office Phone: [phone] Office Fax: [phone]

## 2016-11-05 DIAGNOSIS — Z23 Encounter for immunization: Secondary | ICD-10-CM | POA: Diagnosis not present

## 2016-11-18 ENCOUNTER — Encounter: Payer: Self-pay | Admitting: Internal Medicine

## 2016-12-04 ENCOUNTER — Ambulatory Visit
Admission: RE | Admit: 2016-12-04 | Discharge: 2016-12-04 | Disposition: A | Discharge status: Home / Self Care | Payer: Medicare Other | Source: Ambulatory Visit | Attending: Internal Medicine | Admitting: Internal Medicine

## 2016-12-04 DIAGNOSIS — M8589 Other specified disorders of bone density and structure, multiple sites: Secondary | ICD-10-CM | POA: Diagnosis not present

## 2016-12-04 DIAGNOSIS — E2839 Other primary ovarian failure: Secondary | ICD-10-CM

## 2016-12-04 DIAGNOSIS — Z8739 Personal history of other diseases of the musculoskeletal system and connective tissue: Secondary | ICD-10-CM

## 2016-12-08 ENCOUNTER — Telehealth: Payer: Self-pay

## 2016-12-08 NOTE — Telephone Encounter (Signed)
Spoke with the patient and she verbalized understand the results of her bone density test.

## 2016-12-29 ENCOUNTER — Telehealth: Payer: Self-pay | Admitting: Internal Medicine

## 2016-12-29 NOTE — Telephone Encounter (Signed)
I left a message asking the patient to schedule her AWV-I with nurse at [facility] clinic on either the afternoon of 12/30/16 or the morning of 01/08/17. VDM (DD)

## 2017-01-08 ENCOUNTER — Non-Acute Institutional Stay: Payer: Medicare Other

## 2017-01-08 VITALS — BP 125/64 | HR 65 | Temp 97.5°F | Ht 61.0 in | Wt 133.0 lb

## 2017-01-08 DIAGNOSIS — Z0000 Encounter for general adult medical examination without abnormal findings: Secondary | ICD-10-CM | POA: Diagnosis not present

## 2017-01-08 MED ORDER — PNEUMOCOCCAL 13-VAL CONJ VACC IM SUSP: 0.5000 mL | INTRAMUSCULAR | 0 refills | Status: AC

## 2017-01-08 MED ORDER — ZOSTER VAC RECOMB ADJUVANTED 50 MCG/0.5ML IM SUSR: 0.5000 mL | Freq: Once | INTRAMUSCULAR | 1 refills | Status: AC

## 2017-01-08 NOTE — Patient Instructions (Signed)
Krista Bowman , Thank you for taking time to come for your Medicare Wellness Visit. I appreciate your ongoing commitment to your health goals. Please review the following plan we discussed and let me know if I can assist you in the future.   Screening recommendations/referrals: Colonoscopy excluded, you are over age 81 Mammogram excluded, you are over age 60 Bone Density up to date Recommended yearly ophthalmology/optometry visit for glaucoma screening and checkup Recommended yearly dental visit for hygiene and checkup  Vaccinations: Influenza vaccine up to date. Due 2019 fall season Pneumococcal vaccine 13 due, prescription sent to pharmacy Tdap vaccine due, please wait 4 weeks between vaccines Shingles vaccine due, prescription sent to pharmacy    Advanced directives: In Chart  Conditions/risks identified: none  Next appointment: Dr. Bubba Camp 02/04/2016 @ 8:30am   Preventive Care 65 Years and Older, Female Preventive care refers to lifestyle choices and visits with your health care provider that can promote health and wellness. What does preventive care include?  A yearly physical exam. This is also called an annual well check.  Dental exams once or twice a year.  Routine eye exams. Ask your health care provider how often you should have your eyes checked.  Personal lifestyle choices, including:  Daily care of your teeth and gums.  Regular physical activity.  Eating a healthy diet.  Avoiding tobacco and drug use.  Limiting alcohol use.  Practicing safe sex.  Taking low-dose aspirin every day.  Taking vitamin and mineral supplements as recommended by your health care provider. What happens during an annual well check? The services and screenings done by your health care provider during your annual well check will depend on your age, overall health, lifestyle risk factors, and family history of disease. Counseling  Your health care provider may ask you questions about  your:  Alcohol use.  Tobacco use.  Drug use.  Emotional well-being.  Home and relationship well-being.  Sexual activity.  Eating habits.  History of falls.  Memory and ability to understand (cognition).  Work and work Statistician.  Reproductive health. Screening  You may have the following tests or measurements:  Height, weight, and BMI.  Blood pressure.  Lipid and cholesterol levels. These may be checked every 5 years, or more frequently if you are over 57 years old.  Skin check.  Lung cancer screening. You may have this screening every year starting at age 27 if you have a 30-pack-year history of smoking and currently smoke or have quit within the past 15 years.  Fecal occult blood test (FOBT) of the stool. You may have this test every year starting at age 21.  Flexible sigmoidoscopy or colonoscopy. You may have a sigmoidoscopy every 5 years or a colonoscopy every 10 years starting at age 93.  Hepatitis C blood test.  Hepatitis B blood test.  Sexually transmitted disease (STD) testing.  Diabetes screening. This is done by checking your blood sugar (glucose) after you have not eaten for a while (fasting). You may have this done every 1-3 years.  Bone density scan. This is done to screen for osteoporosis. You may have this done starting at age 107.  Mammogram. This may be done every 1-2 years. Talk to your health care provider about how often you should have regular mammograms. Talk with your health care provider about your test results, treatment options, and if necessary, the need for more tests. Vaccines  Your health care provider may recommend certain vaccines, such as:  Influenza vaccine. This is  recommended every year.  Tetanus, diphtheria, and acellular pertussis (Tdap, Td) vaccine. You may need a Td booster every 10 years.  Zoster vaccine. You may need this after age 29.  Pneumococcal 13-valent conjugate (PCV13) vaccine. One dose is recommended  after age 1.  Pneumococcal polysaccharide (PPSV23) vaccine. One dose is recommended after age 55. Talk to your health care provider about which screenings and vaccines you need and how often you need them. This information is not intended to replace advice given to you by your health care provider. Make sure you discuss any questions you have with your health care provider. Document Released: 02/09/2015 Document Revised: 10/03/2015 Document Reviewed: 11/14/2014 Elsevier Interactive Patient Education  2017 [place] Prevention in the Home Falls can cause injuries. They can happen to people of all ages. There are many things you can do to make your home safe and to help prevent falls. What can I do on the outside of my home?  Regularly fix the edges of walkways and driveways and fix any cracks.  Remove anything that might make you trip as you walk through a door, such as a raised step or threshold.  Trim any bushes or trees on the path to your home.  Use bright outdoor lighting.  Clear any walking paths of anything that might make someone trip, such as rocks or tools.  Regularly check to see if handrails are loose or broken. Make sure that both sides of any steps have handrails.  Any raised decks and porches should have guardrails on the edges.  Have any leaves, snow, or ice cleared regularly.  Use sand or salt on walking paths during winter.  Clean up any spills in your garage right away. This includes oil or grease spills. What can I do in the bathroom?  Use night lights.  Install grab bars by the toilet and in the tub and shower. Do not use towel bars as grab bars.  Use non-skid mats or decals in the tub or shower.  If you need to sit down in the shower, use a plastic, non-slip stool.  Keep the floor dry. Clean up any water that spills on the floor as soon as it happens.  Remove soap buildup in the tub or shower regularly.  Attach bath mats securely with  double-sided non-slip rug tape.  Do not have throw rugs and other things on the floor that can make you trip. What can I do in the bedroom?  Use night lights.  Make sure that you have a light by your bed that is easy to reach.  Do not use any sheets or blankets that are too big for your bed. They should not hang down onto the floor.  Have a firm chair that has side arms. You can use this for support while you get dressed.  Do not have throw rugs and other things on the floor that can make you trip. What can I do in the kitchen?  Clean up any spills right away.  Avoid walking on wet floors.  Keep items that you use a lot in easy-to-reach places.  If you need to reach something above you, use a strong step stool that has a grab bar.  Keep electrical cords out of the way.  Do not use floor polish or wax that makes floors slippery. If you must use wax, use non-skid floor wax.  Do not have throw rugs and other things on the floor that can make you trip.  What can I do with my stairs?  Do not leave any items on the stairs.  Make sure that there are handrails on both sides of the stairs and use them. Fix handrails that are broken or loose. Make sure that handrails are as long as the stairways.  Check any carpeting to make sure that it is firmly attached to the stairs. Fix any carpet that is loose or worn.  Avoid having throw rugs at the top or bottom of the stairs. If you do have throw rugs, attach them to the floor with carpet tape.  Make sure that you have a light switch at the top of the stairs and the bottom of the stairs. If you do not have them, ask someone to add them for you. What else can I do to help prevent falls?  Wear shoes that:  Do not have high heels.  Have rubber bottoms.  Are comfortable and fit you well.  Are closed at the toe. Do not wear sandals.  If you use a stepladder:  Make sure that it is fully opened. Do not climb a closed stepladder.  Make  sure that both sides of the stepladder are locked into place.  Ask someone to hold it for you, if possible.  Clearly mark and make sure that you can see:  Any grab bars or handrails.  First and last steps.  Where the edge of each step is.  Use tools that help you move around (mobility aids) if they are needed. These include:  Canes.  Walkers.  Scooters.  Crutches.  Turn on the lights when you go into a dark area. Replace any light bulbs as soon as they burn out.  Set up your furniture so you have a clear path. Avoid moving your furniture around.  If any of your floors are uneven, fix them.  If there are any pets around you, be aware of where they are.  Review your medicines with your doctor. Some medicines can make you feel dizzy. This can increase your chance of falling. Ask your doctor what other things that you can do to help prevent falls. This information is not intended to replace advice given to you by your health care provider. Make sure you discuss any questions you have with your health care provider. Document Released: 11/09/2008 Document Revised: 06/21/2015 Document Reviewed: 02/17/2014 Elsevier Interactive Patient Education  2017 [employer].

## 2017-01-08 NOTE — Progress Notes (Signed)
Subjective:   Krista Bowman is a 83 y.o. female who presents for an Initial Medicare Annual Wellness Visit at [facility]       Objective:    Today's Vitals   01/08/17 1141  BP: 125/64  Pulse: 65  Temp: (!) 97.5 F (36.4 C)  TempSrc: Oral  SpO2: 98%  Weight: 133 lb (60.3 kg)  Height: 5\' 1"  (1.549 m)   Body mass index is 25.13 kg/m.  Advanced Directives 01/08/2017 05/08/2016 11/08/2015 05/24/2015 04/12/2015 06/08/2014 06/23/2013  Does Patient Have a Medical Advance Directive? Yes Yes Yes Yes Yes Yes Patient has advance directive, copy not in chart  Type of Advance Directive Healthcare Power of [place];Out of facility DNR (pink MOST or yellow form) [place];Out of facility DNR (pink MOST or yellow form) [place];Out of facility DNR (pink MOST or yellow form) [place];Living will;Out of facility DNR (pink MOST or yellow form) Living will Living will  Does patient want to make changes to medical advance directive? No - Patient declined - - - - Yes - information given No change requested  Copy of [place] in Chart? Yes Yes Yes Yes No - copy requested No - copy requested -  Pre-existing out of facility DNR order (yellow form or pink MOST form) - Pink MOST form placed in chart (order not valid for inpatient use) Pink MOST form placed in chart (order not valid for inpatient use) Pink MOST form placed in chart (order not valid for inpatient use) - - No    Current Medications (verified) Outpatient Encounter Medications as of 01/08/2017  Medication Sig  . aspirin 81 MG tablet Take 81 mg by mouth daily.  . Calcium Carbonate-Vitamin D (CALCIUM 600+D) 600-200 MG-UNIT TABS Take 1 tablet by mouth 2 (two) times daily.  . cholecalciferol (VITAMIN D) 1000 UNITS tablet Take 1,000 Units by mouth daily.  Marland Kitchen levothyroxine (SYNTHROID, LEVOTHROID) 75 MCG tablet  Take one tablet by mouth once daily 30 minutes before breakfast for thyroid  . Multiple Vitamin (MULTIVITAMIN WITH MINERALS) TABS Take 1 tablet by mouth daily.  . Omega-3 Fatty Acids (FISH OIL) 1200 MG CAPS Take 1 capsule by mouth daily.  . vitamin B-12 (CYANOCOBALAMIN) 100 MCG tablet Take 500 mcg by mouth daily.    No facility-administered encounter medications on file as of 01/08/2017.     Allergies (verified) Mushroom extract complex and Penicillins   History: Past Medical History:  Diagnosis Date  . Abnormality of gait   . Cerebral atrophy 01/21/13  . Chest pain, unspecified   . Contusion of face, scalp, and neck except eye(s)   . Contusion of lower leg   . Corns and callosities   . Edema   . Family history of malignant neoplasm of breast   . Lumbago   . Osteoarthrosis, unspecified whether generalized or localized, unspecified site   . Osteoporosis, unspecified   . Other and unspecified hyperlipidemia   . Other conditions of brain(348.89)   . Other generalized ischemic cerebrovascular disease 01/21/13   small vessel disease  . Pain in joint, lower leg   . Personal history of fall   . Syncope and collapse   . Umbilical hernia without mention of obstruction or gangrene   . Unspecified constipation   . Unspecified hearing loss   . Unspecified hypothyroidism   . Unspecified vitamin D deficiency   . Urinary frequency   . Urinary  tract infection, site not specified   . Ventral hernia, unspecified, without mention of obstruction or gangrene    Past Surgical History:  Procedure Laterality Date  . CATARACT EXTRACTION W/ INTRAOCULAR LENS  IMPLANT, BILATERAL  2012   Dr. Bing Plume  . HERNIA REPAIR  01/2008   laparoscopic Dr. [name]  . KNEE SURGERY  2008   Dr. Alyssa Grove  left  . TOE AMPUTATION  1986   little toe right foot  Dr. Dema Severin; [place]   No family history on file. Social History   Socioeconomic History  . Marital status: Widowed    Spouse name: None  . Number  of children: None  . Years of education: None  . Highest education level: None  Social Needs  . Financial resource strain: Not hard at all  . Food insecurity - worry: Never true  . Food insecurity - inability: Never true  . Transportation needs - medical: No  . Transportation needs - non-medical: No  Occupational History  . Occupation: retired Psychologist, counselling: RETIRED  Tobacco Use  . Smoking status: Never Smoker  . Smokeless tobacco: Never Used  Substance and Sexual Activity  . Alcohol use: No  . Drug use: No  . Sexual activity: No  Other Topics Concern  . None  Social History Narrative   Lives at [facility]   Married husband in [place]   Exercise : water exercise 2-3 times a week   Never smoked   No alcohol     Tobacco Counseling Counseling given: Not Answered   Clinical Intake:  Pre-visit preparation completed: No  Pain : No/denies pain     Nutritional Risks: None Diabetes: No  Activities of Daily Living: Independent Ambulation: Independent with device- listed below Home Assistive Devices/Equipment: Eyeglasses, Dentures (specify type), Communication device (specify type)(bilateral hearing aids, bridges) Medication Administration: Independent Home Management: Independent  Barriers to Care Management & Learning: None  Do you feel unsafe in your current relationship?: No Do you feel physically threatened by others?: No Anyone hurting you at home, work, or school?: No Unable to ask?: No Information provided on Community resources: No  How often do you need to have someone help you when you read instructions, pamphlets, or other written materials from your doctor or pharmacy?: 1 - Never What is the last grade level you completed in school?: [employer]  Interpreter Needed?: No  Information entered by :: Rich Reining, RN   Activities of Daily Living In your present state of health, do you have any  difficulty performing the following activities: 01/08/2017  Hearing? N  Vision? N  Difficulty concentrating or making decisions? N  Walking or climbing stairs? N  Dressing or bathing? N  Doing errands, shopping? N  Preparing Food and eating ? N  Using the Toilet? N  In the past six months, have you accidently leaked urine? Y  Do you have problems with loss of bowel control? N  Managing your Medications? N  Managing your Finances? N  Housekeeping or managing your Housekeeping? N  Some recent data might be hidden    Timed Get Up and Go Performed 15 seconds, fall risk  Immunizations and Health Maintenance Immunization History  Administered Date(s) Administered  . Influenza Whole 10/28/2011, 11/10/2012  . Influenza, High Dose Seasonal PF 11/05/2016  . Influenza-Unspecified 11/14/2013, 10/26/2014, 11/08/2015  . Pneumococcal Polysaccharide-23 02/08/2007  . Td 01/28/1991  . Zoster 12/27/2012   There are no preventive  care reminders to display for this patient.  Patient Care Team: Blanchie Serve, MD as PCP - General (Internal Medicine) [place], [facility] [name], [name], NP as Nurse Practitioner (Nurse Practitioner) [name], MD as Consulting Physician (Ophthalmology) [name], MD as Consulting Physician (Orthopedic Surgery) [name], [name], MD as Consulting Physician (Dermatology) [name], MD (Inactive) as Consulting Physician (Gastroenterology) [name], MD as Consulting Physician (General Surgery)  Indicate any recent Medical Services you may have received from other than [facility] providers in the past year (date may be approximate).     Assessment:   This is a routine wellness examination for [facility].   Hearing/Vision screen Hearing Screening Comments: Wears hearing aids Vision Screening Comments: Sees Dr. Bing Plume annually  Dietary issues and exercise activities discussed: Current Exercise Habits: Home exercise routine, Type of exercise:  stretching;strength training/weights, Time (Minutes): 20, Frequency (Times/Week): 3, Weekly Exercise (Minutes/Week): 60, Intensity: Mild, Exercise limited by: None identified  Goals    None     Depression Screen PHQ 2/9 Scores 01/08/2017 10/28/2016 04/12/2015 06/08/2014 06/23/2013  PHQ - 2 Score 0 0 0 0 0  PHQ- 9 Score - 0 - - -    Fall Risk Fall Risk  01/08/2017 10/28/2016 05/24/2015 04/12/2015 06/08/2014  Falls in the past year? No No No No No    Is the patient's home free of loose throw rugs in walkways, pet beds, electrical cords, etc?   yes      Grab bars in the bathroom? yes      Handrails on the stairs?   yes      Adequate lighting?   yes  Cognitive Function: MMSE - Mini Mental State Exam 04/12/2015 06/10/2012  Orientation to time 5 5  Orientation to Place 5 5  Registration 3 3  Attention/ Calculation 5 5  Recall 3 3  Language- name 2 objects 2 2  Language- repeat 1 1  Language- follow 3 step command 3 3  Language- read & follow direction 1 1  Write a sentence 1 1  Copy design 1 1  Total score 30 30        Screening Tests Health Maintenance  Topic Date Due  . PNA vac Low Risk Adult (2 of 2 - PCV13) 01/27/2017 (Originally 02/08/2008)  . TETANUS/TDAP  01/27/2018 (Originally 01/27/2001)  . INFLUENZA VACCINE  Completed  . DEXA SCAN  Completed    Cancer Screenings: Lung:  Low Dose CT Chest recommended if Age 49-80 years, 30 pack-year currently smoking OR have quit w/in 15years. Patient does not qualify. Breast:  Up to date on Mammogram? Yes  Up to date of Bone Density/Dexa? Yes Colorectal: up to date  Additional Screenings:  Hepatitis B/HIV/Syphillis:Not indicated Hepatitis C Screening: Not indicated     Plan:    I have personally reviewed and addressed the Medicare Annual Wellness questionnaire and have noted the following in the patient's chart:  A. Medical and social history B. Use of alcohol, tobacco or illicit drugs  C. Current medications and  supplements D. Functional ability and status E.  Nutritional status F.  Physical activity G. Advance directives H. List of other physicians I.  Hospitalizations, surgeries, and ER visits in previous 12 months J.  [place] to include hearing, vision, cognitive, depression L. Referrals and appointments - none  In addition, I have reviewed and discussed with patient certain preventive protocols, quality metrics, and best practice recommendations. A written personalized care plan for preventive services as well as general preventive  health recommendations were provided to patient.  See attached scanned questionnaire for additional information.   Signed,   Rich Reining, RN Nurse Health Advisor   Quick Notes   Health Maintenance: PNA 13 and shingrix prescription sent to pharmacy. Pt will get TDAP next year     Abnormal Screen: MMSE 30/30. Passed clock drawing     Patient Concerns: None     Nurse Concerns: None

## 2017-01-22 ENCOUNTER — Other Ambulatory Visit: Payer: Self-pay

## 2017-01-22 ENCOUNTER — Telehealth: Payer: Self-pay

## 2017-01-22 MED ORDER — PNEUMOCOCCAL 13-VAL CONJ VACC IM SUSP: 0.5000 mL | INTRAMUSCULAR | 0 refills | Status: AC

## 2017-01-22 NOTE — Addendum Note (Signed)
Addended by: Tyson Dense E on: 01/22/2017 10:59 AM   Modules accepted: Orders

## 2017-01-22 NOTE — Telephone Encounter (Signed)
Patient called back and I had misunderstood voicemail. She did not receive shingrix yet-she is on the waiting list. I reordered Pneumonia shot to pharmacy

## 2017-01-22 NOTE — Telephone Encounter (Signed)
Returned patient phone call about immunizations. Cleared up that she should get her second shingles vaccine in 2-6 months and then we will order her pneumonia 13 vaccine

## 2017-01-29 DIAGNOSIS — E559 Vitamin D deficiency, unspecified: Secondary | ICD-10-CM | POA: Diagnosis not present

## 2017-01-29 DIAGNOSIS — N183 Chronic kidney disease, stage 3 (moderate): Secondary | ICD-10-CM | POA: Diagnosis not present

## 2017-01-29 DIAGNOSIS — R7303 Prediabetes: Secondary | ICD-10-CM | POA: Diagnosis not present

## 2017-01-29 DIAGNOSIS — E039 Hypothyroidism, unspecified: Secondary | ICD-10-CM | POA: Diagnosis not present

## 2017-01-29 DIAGNOSIS — Z8739 Personal history of other diseases of the musculoskeletal system and connective tissue: Secondary | ICD-10-CM | POA: Diagnosis not present

## 2017-01-29 DIAGNOSIS — E785 Hyperlipidemia, unspecified: Secondary | ICD-10-CM | POA: Diagnosis not present

## 2017-01-29 DIAGNOSIS — E538 Deficiency of other specified B group vitamins: Secondary | ICD-10-CM | POA: Diagnosis not present

## 2017-01-30 LAB — LIPID PANEL
CHOLESTEROL: 214 mg/dL — AB (ref <200)
HDL: 59 mg/dL (ref >50)
LDL CHOLESTEROL (CALC): 127 mg/dL — AB
Non-HDL Cholesterol (Calc): 155 mg/dL (calc) — ABNORMAL HIGH (ref <130)
Total CHOL/HDL Ratio: 3.6 (calc) (ref <5.0)
Triglycerides: 162 mg/dL — ABNORMAL HIGH (ref <150)

## 2017-01-30 LAB — HEMOGLOBIN A1C
HEMOGLOBIN A1C: 5.8 %{Hb} — AB (ref <5.7)
MEAN PLASMA GLUCOSE: 120 (calc)
eAG (mmol/L): 6.6 (calc)

## 2017-01-30 LAB — COMPLETE METABOLIC PANEL WITH GFR
AG Ratio: 1.7 (calc) (ref 1.0–2.5)
ALBUMIN MSPROF: 4 g/dL (ref 3.6–5.1)
ALT: 10 U/L (ref 6–29)
AST: 15 U/L (ref 10–35)
Alkaline phosphatase (APISO): 53 U/L (ref 33–130)
BUN / CREAT RATIO: 16 (calc) (ref 6–22)
BUN: 18 mg/dL (ref 7–25)
CALCIUM: 9.3 mg/dL (ref 8.6–10.4)
CO2: 28 mmol/L (ref 20–32)
Chloride: 106 mmol/L (ref 98–110)
Creat: 1.14 mg/dL — ABNORMAL HIGH (ref 0.60–0.88)
GFR, EST AFRICAN AMERICAN: 51 mL/min/{1.73_m2} — AB (ref >=60)
GFR, EST NON AFRICAN AMERICAN: 44 mL/min/{1.73_m2} — AB (ref >=60)
GLOBULIN: 2.4 g/dL (ref 1.9–3.7)
Glucose, Bld: 93 mg/dL (ref 65–99)
Potassium: 4.2 mmol/L (ref 3.5–5.3)
SODIUM: 140 mmol/L (ref 135–146)
TOTAL PROTEIN: 6.4 g/dL (ref 6.1–8.1)
Total Bilirubin: 0.5 mg/dL (ref 0.2–1.2)

## 2017-01-30 LAB — VITAMIN D 25 HYDROXY (VIT D DEFICIENCY, FRACTURES): VIT D 25 HYDROXY: 42 ng/mL (ref 30–100)

## 2017-01-30 LAB — VITAMIN B12: VITAMIN B 12: 1045 pg/mL (ref 200–1100)

## 2017-01-30 LAB — TSH: TSH: 1.34 mIU/L (ref 0.40–4.50)

## 2017-02-03 ENCOUNTER — Encounter: Payer: Self-pay | Admitting: Internal Medicine

## 2017-02-03 ENCOUNTER — Non-Acute Institutional Stay: Payer: Medicare Other | Admitting: Internal Medicine

## 2017-02-03 VITALS — BP 124/68 | HR 78 | Temp 97.6°F | Resp 16 | Ht 61.0 in | Wt 133.6 lb

## 2017-02-03 DIAGNOSIS — R7303 Prediabetes: Secondary | ICD-10-CM | POA: Diagnosis not present

## 2017-02-03 DIAGNOSIS — L570 Actinic keratosis: Secondary | ICD-10-CM

## 2017-02-03 DIAGNOSIS — E782 Mixed hyperlipidemia: Secondary | ICD-10-CM

## 2017-02-03 DIAGNOSIS — H1131 Conjunctival hemorrhage, right eye: Secondary | ICD-10-CM

## 2017-02-03 DIAGNOSIS — N183 Chronic kidney disease, stage 3 unspecified: Secondary | ICD-10-CM

## 2017-02-03 DIAGNOSIS — M8588 Other specified disorders of bone density and structure, other site: Secondary | ICD-10-CM | POA: Diagnosis not present

## 2017-02-03 DIAGNOSIS — M8580 Other specified disorders of bone density and structure, unspecified site: Secondary | ICD-10-CM | POA: Insufficient documentation

## 2017-02-03 DIAGNOSIS — E039 Hypothyroidism, unspecified: Secondary | ICD-10-CM | POA: Diagnosis not present

## 2017-02-03 DIAGNOSIS — E538 Deficiency of other specified B group vitamins: Secondary | ICD-10-CM | POA: Diagnosis not present

## 2017-02-03 NOTE — Patient Instructions (Signed)
Actinic Keratosis An actinic keratosis is a precancerous growth on the skin. This means that it could develop into skin cancer if it is not treated. About 1% of these growths (actinic keratoses) turn into skin cancer within one year if they are not treated. It is important to have all of these growths evaluated to determine the best treatment approach. What are the causes? This condition is caused by getting too much ultraviolet (UV) radiation from the sun or other UV light sources. What increases the risk? The following factors may make you more likely to develop this condition:  Having light-colored skin and blue eyes.  Having blonde or red hair.  Spending a lot of time in the sun.  Inadequate skin protection when outdoors. This may include: ? Not using sunscreen properly. ? Not covering up skin that is exposed to sunlight.  Aging. The risk of developing an actinic keratosis increases with age.  What are the signs or symptoms? Actinic keratoses look like scaly, rough spots of skin.They can be as small as a pinhead or as big as a quarter. They may itch, hurt, or feel sensitive. In most cases, the growths become red. In some cases, they may be skin-colored, light tan, dark tan, pink, or a combination of any of these colors. There may be a small piece of pink or gray skin (skin tag) growing from the actinic keratosis. In some cases, it may be easier to notice actinic keratoses by feeling them, rather than seeing them. Actinic keratoses appear most often on areas of skin that get a lot of sun exposure, including the scalp, face, ears, lips, upper back, forearms, and the backs of the hands. Sometimes, actinic keratoses disappear, but many reappear a few days to a few weeks later. How is this diagnosed? This condition is usually diagnosed with a physical exam. A tissue sample may be removed from the actinic keratosis and examined under a microscope (biopsy). How is this treated?  Treatment for  this condition may include:  Scraping off the actinic keratosis (curettage).  Freezing the actinic keratosis with liquid nitrogen (cryosurgery). This causes the growth to eventually fall off the skin.  Applying medicated creams or gels to destroy the cells in the growth.  Applying chemicals to the actinic keratosis to make the outer layers of skin peel off (chemical peel).  Photodynamic therapy. In this procedure, medicated cream is applied to the actinic keratosis. This cream increases your skin's sensitivity to light. Then, a strong light is aimed at the actinic keratosis to destroy cells in the growth.  Follow these instructions at home: Skin care  Apply cool, wet cloths (cool compresses) to the affected areas.  Do not scratch your skin.  Check your skin regularly for any growths, especially growths that: ? Start to itch or bleed. ? Change in size, shape, or color. Caring for the treated area  Keep the treated area clean and dry as told by your health care provider.  Do not apply any medicine, cream, or lotion to the treated area unless your health care provider tells you to do that.  Do not pick at blisters or try to break them open. This can cause infection and scarring.  If you have red or irritated skin after treatment, follow instructions from your health care provider about how to take care of the treated area. Make sure you: ? Wash your hands with soap and water before you change your bandage (dressing). If soap and water are not available, use  hand sanitizer. ? Change your dressing as told by your health care provider.  If you have red or irritated skin after treatment, check your treated area every day for signs of infection. Check for: ? Swelling, pain, or more redness. ? Fluid or blood. ? Warmth. ? Pus or a bad smell. General instructions  Take over-the-counter and prescription medicines only as told by your health care provider.  Return to your normal  activities as told by your health care provider. Ask your health care provider what activities are safe for you.  Do not use any tobacco products, such as cigarettes, chewing tobacco, and e-cigarettes. If you need help quitting, ask your health care provider.  Have a skin exam done every year by a health care provider who is a skin conditions specialist (dermatologist).  Keep all follow-up visits as told by your health care provider. This is important. How is this prevented?  Do not get sunburns.  Try to avoid the sun between 10:00 a.m. and 4:00 p.m. This is when the UV light is the strongest.  Use a sunscreen or sunblock with SPF 30 (sun protection factor 30) or greater.  Apply sunscreen before you are exposed to sunlight, and reapply periodically as often as directed by the instructions on the sunscreen container.  Always wear sunglasses that have UV protection, and always wear hats and clothing to protect your skin from sunlight.  When possible, avoid medicines that increase your sensitivity to sunlight. These include: ? Certain antibiotic medicines. ? Certain water pills (diuretics). ? Certain prescription medicines that are used to treat acne (retinoids).  Do not use tanning beds or other indoor tanning devices. Contact a health care provider if:  You notice any changes or new growths on your skin.  You have swelling, pain, or more redness around your treated area.  You have fluid or blood coming from your treated area.  Your treated area feels warm to the touch.  You have pus or a bad smell coming from your treated area.  You have a fever.  You have a blister that becomes large and painful. This information is not intended to replace advice given to you by your health care provider. Make sure you discuss any questions you have with your health care provider. Document Released: 04/11/2008 Document Revised: 09/14/2015 Document Reviewed: 09/23/2014 Elsevier Interactive  Patient Education  2018 [employer]  

## 2017-02-03 NOTE — Progress Notes (Signed)
Provider: Blanchie Serve MD   Location:  [place] of Service:  Clinic (12)  PCP: Blanchie Serve, MD Patient Care Team: Blanchie Serve, MD as PCP - General (Internal Medicine) [name] [name], [name], NP as Nurse Practitioner (Nurse Practitioner) [name], MD as Consulting Physician (Ophthalmology) [name], MD as Consulting Physician (Orthopedic Surgery) [name], [name], MD as Consulting Physician (Dermatology) [name], MD (Inactive) as Consulting Physician (Gastroenterology) [name], MD as Consulting Physician (General Surgery)  Extended Emergency Contact Information Primary Emergency Contact: Vicenta Dunning, [place] [employer] Phone: [phone] Relation: Daughter   Goals of Care: Advanced Directive information Advanced Directives 01/08/2017  Does Patient Have a Medical Advance Directive? Yes  Type of Advance Directive [place]  Does patient want to make changes to medical advance directive? No - Patient declined  Copy of Enigma in Chart? Yes  Pre-existing out of facility DNR order (yellow form or pink MOST form) -      Chief Complaint  Patient presents with  . Medical Management of Chronic Issues    3 month follow up  . Medication Refill    No refills needed at this time.   Marland Kitchen Results    Discuss labs    HPI: Patient is a 84 y.o. female seen today for routine visit.   Right eye redness since Sunday morning. Denies any pain, itching or discharge. Has some discomfort. Denies any trauma.   Skin concerns- has noticed white dry spots to forehead and right forearm and hand. two spots has been there for more than a year and one in hand for few weeks. They itch at times. Denies pain or drainage. Denies rapid increase in size or change in shape. No known family history of malignant skin conditions  Hypothyroidism- currently on levothyroxine  75 mcg daily.   b12 def- takes b12 500 mcg daily  osteopenia- on calcium and vitamin d supplement, does water aerobics.   Chronic low back pain- likely from DJD. Therapy and tyelnol has been helpful. Denies any radiculopathy.   ckd stage 3- not taking NSAIDs.     Past Medical History:  Diagnosis Date  . Abnormality of gait   . Cerebral atrophy 01/21/13  . Chest pain, unspecified   . Contusion of face, scalp, and neck except eye(s)   . Contusion of lower leg   . Corns and callosities   . Edema   . Family history of malignant neoplasm of breast   . Lumbago   . Osteoarthrosis, unspecified whether generalized or localized, unspecified site   . Osteoporosis, unspecified   . Other and unspecified hyperlipidemia   . Other conditions of brain(348.89)   . Other generalized ischemic cerebrovascular disease 01/21/13   small vessel disease  . Pain in joint, lower leg   . Personal history of fall   . Syncope and collapse   . Umbilical hernia without mention of obstruction or gangrene   . Unspecified constipation   . Unspecified hearing loss   . Unspecified hypothyroidism   . Unspecified vitamin D deficiency   . Urinary frequency   . Urinary tract infection, site not specified   . Ventral hernia, unspecified, without mention of obstruction or gangrene    Past Surgical History:  Procedure Laterality Date  . CATARACT EXTRACTION W/ INTRAOCULAR LENS  IMPLANT, BILATERAL  2012   Dr.  Rawlings  01/2008   laparoscopic Dr. [name]  . KNEE SURGERY  2008   Dr. Alyssa Grove  left  . TOE AMPUTATION  1986   little toe right foot  Dr. Dema Severin; [facility]    reports that  has never smoked. she has never used smokeless tobacco. She reports that she does not drink alcohol or use drugs. Social History   Socioeconomic History  . Marital status: Widowed    Spouse name: Not on file  . Number of children: Not on file  . Years of education: Not on file  . Highest education level: Not  on file  Social Needs  . Financial resource strain: Not hard at all  . Food insecurity - worry: Never true  . Food insecurity - inability: Never true  . Transportation needs - medical: No  . Transportation needs - non-medical: No  Occupational History  . Occupation: retired Psychologist, counselling: RETIRED  Tobacco Use  . Smoking status: Never Smoker  . Smokeless tobacco: Never Used  Substance and Sexual Activity  . Alcohol use: No  . Drug use: No  . Sexual activity: No  Other Topics Concern  . Not on file  Social History Narrative   Lives at [facility]/2005   Married husband in [place]   Exercise : water exercise 2-3 times a week   Never smoked   No alcohol     Functional Status Survey:    History reviewed. No pertinent family history.  Health Maintenance  Topic Date Due  . PNA vac Low Risk Adult (2 of 2 - PCV13) 02/08/2008  . TETANUS/TDAP  01/27/2018 (Originally 01/27/2001)  . INFLUENZA VACCINE  Completed  . DEXA SCAN  Completed    Allergies  Allergen Reactions  . Mushroom Extract Complex   . Penicillins Diarrhea    Outpatient Encounter Medications as of 02/03/2017  Medication Sig  . aspirin 81 MG tablet Take 81 mg by mouth daily.  . Calcium Carbonate-Vitamin D (CALCIUM 600+D) 600-200 MG-UNIT TABS Take 1 tablet by mouth 2 (two) times daily.  . cholecalciferol (VITAMIN D) 1000 UNITS tablet Take 1,000 Units by mouth daily.  Marland Kitchen levothyroxine (SYNTHROID, LEVOTHROID) 75 MCG tablet Take one tablet by mouth once daily 30 minutes before breakfast for thyroid  . Multiple Vitamin (MULTIVITAMIN WITH MINERALS) TABS Take 1 tablet by mouth daily.  . Omega-3 Fatty Acids (FISH OIL) 1200 MG CAPS Take 1 capsule by mouth daily.  . vitamin B-12 (CYANOCOBALAMIN) 500 MCG tablet Take 500 mcg by mouth daily.  . [DISCONTINUED] vitamin B-12 (CYANOCOBALAMIN) 100 MCG tablet Take 500 mcg by mouth daily.    No facility-administered encounter medications on  file as of 02/03/2017.     Review of Systems  Constitutional: Negative for chills, fatigue and fever.  HENT: Positive for hearing loss and rhinorrhea. Negative for congestion, ear pain, mouth sores, sore throat and trouble swallowing.   Eyes: Positive for redness and visual disturbance. Negative for pain, discharge and itching.       Wears corrective glasses  Respiratory: Negative for chest tightness and shortness of breath.   Cardiovascular: Negative for chest pain, palpitations and leg swelling.  Gastrointestinal: Negative for abdominal pain, blood in stool, diarrhea, nausea and vomiting.  Endocrine: Positive for cold intolerance.  Genitourinary: Negative for dysuria.  Musculoskeletal: Positive for back pain. Negative for gait problem.       No fall reported  Skin: Negative  for wound.  Neurological: Negative for dizziness and headaches.  Psychiatric/Behavioral: Negative for confusion and sleep disturbance.    Vitals:   02/03/17 0829  BP: 124/68  Pulse: 78  Resp: 16  Temp: 97.6 F (36.4 C)  TempSrc: Oral  SpO2: 97%  Weight: 133 lb 9.6 oz (60.6 kg)  Height: '5\' 1"'  (1.549 m)   Body mass index is 25.24 kg/m.   Wt Readings from Last 3 Encounters:  02/03/17 133 lb 9.6 oz (60.6 kg)  01/08/17 133 lb (60.3 kg)  10/28/16 139 lb (63 kg)   Physical Exam  Constitutional: She is oriented to person, place, and time. She appears well-developed and well-nourished. No distress.  HENT:  Head: Normocephalic and atraumatic.  Mouth/Throat: Oropharynx is clear and moist. No oropharyngeal exudate.  Hearing aid present  Eyes: EOM are normal. Pupils are equal, round, and reactive to light. Right eye exhibits no discharge. Left eye exhibits no discharge. No scleral icterus.  Right conjunctiva lateral side redness present, left conjunctiva normal. Corrective glasses  Neck: Normal range of motion. Neck supple.  Cardiovascular: Normal rate and regular rhythm.  Murmur heard. Pulmonary/Chest:  Effort normal and breath sounds normal. She has no wheezes. She has no rales.  Abdominal: Soft. Bowel sounds are normal. There is no tenderness.  Reducible umbilical hernia  Musculoskeletal: Normal range of motion. She exhibits deformity. She exhibits no edema or tenderness.  No spinal tenderness  Lymphadenopathy:    She has no cervical adenopathy.  Neurological: She is alert and oriented to person, place, and time.  Skin: Skin is warm and dry. No rash noted. She is not diaphoretic.  Actinic keratosis to right forehead, right forearm and hand  Psychiatric: She has a normal mood and affect. Her behavior is normal.    Labs reviewed: Basic Metabolic Panel: Recent Labs    10/21/16 0000 01/29/17 0715  NA 141 140  K 4.4 4.2  CL 105 106  CO2 27 28  GLUCOSE 104* 93  BUN 19 18  CREATININE 1.13* 1.14*  CALCIUM 9.6 9.3   Liver Function Tests: Recent Labs    10/21/16 0000 01/29/17 0715  AST 15 15  ALT 10 10  BILITOT 0.5 0.5  PROT 7.0 6.4   No results for input(s): LIPASE, AMYLASE in the last 8760 hours. No results for input(s): AMMONIA in the last 8760 hours. CBC: Recent Labs    10/21/16 0000  WBC 5.9  NEUTROABS 2,926  HGB 13.2  HCT 39.6  MCV 88.4  PLT 359   Cardiac Enzymes: No results for input(s): CKTOTAL, CKMB, CKMBINDEX, TROPONINI in the last 8760 hours. BNP: Invalid input(s): POCBNP Lab Results  Component Value Date   HGBA1C 5.8 (H) 01/29/2017   Lab Results  Component Value Date   TSH 1.34 01/29/2017   Lab Results  Component Value Date   VITAMINB12 1,045 01/29/2017   No results found for: FOLATE No results found for: IRON, TIBC, FERRITIN  Lipid Panel: Recent Labs    10/21/16 0000 01/29/17 0715  CHOL 248* 214*  HDL 67 59  TRIG 187* 162*  CHOLHDL 3.7 3.6   Lab Results  Component Value Date   HGBA1C 5.8 (H) 01/29/2017    Procedures since last visit: No results found.   12/04/16 DEXA scan- T score -2.2, osteopenia  Assessment/Plan  1.  CKD (chronic kidney disease) stage 3, GFR 30-59 ml/min (HCC) Avoid NSAIDs, monitor renal function - CMP with eGFR; Future - CBC (no diff); Future  2. Acquired hypothyroidism Stable  TSH. Continue levothyroxine - CBC (no diff); Future  3. Mixed hyperlipidemia Dietary counselling, continue exercise. Reviewed labs - CBC (no diff); Future  4. Prediabetes Monitor calorie intake and cut down on processed food and sugar/ sweets.  - CBC (no diff); Future  5. Osteopenia of lumbar spine Normal vit d level. C/w calcium and vit d. Reviewed dexa scan  6. Conjunctival hemorrhage, right eye Supportive care with artificial tear drops for dry eye and discomfort. Normal vision.   7. Actinic keratosis Advised to keep skin moisturized. Apply sunscreen going outdoors. If change in size and shape, to notify.  Dermatology referral- to see dermatologist that comes to the facility.   8. b12 def Stable. Reviewed b12 level. C/w b12 supplement     Labs/tests ordered:   Lab Orders     CMP with eGFR     CBC (no diff)  Next appointment: physical in 4 months.   Communication: reviewed care plan with patient    Blanchie Serve, MD Internal Medicine [place], [place] [address] Cell Phone (Monday-Friday 8 am - 5 pm): [phone] On Call: [phone] and follow prompts after 5 pm and on weekends Office Phone: [phone] Office Fax: [phone]

## 2017-02-05 DIAGNOSIS — Z23 Encounter for immunization: Secondary | ICD-10-CM | POA: Diagnosis not present

## 2017-03-06 ENCOUNTER — Telehealth: Payer: Self-pay | Admitting: *Deleted

## 2017-03-06 NOTE — Telephone Encounter (Signed)
Pt calling stating she has 2 appts for for dermatology. Dr. Bubba Camp put in referral to [facility] and that appt is Monday according to the referral notes. Also [facility] Pt would rather see the dermatologist at [facility] instead of going out. The appt at [facility] dermatology would need to be canceled if pt will not be going.   PLEASE CALL PT AND LET HER KNOW [phone]  I'm not sure which one Dr Bubba Camp wanted her to see.

## 2017-03-06 NOTE — Telephone Encounter (Signed)
Thank you :)

## 2017-03-06 NOTE — Telephone Encounter (Signed)
Spoke with patient regarding this appointment, she stated that she does want to come to [facility]  Dermatologist. I called the office to cancel appointment.

## 2017-03-11 DIAGNOSIS — L814 Other melanin hyperpigmentation: Secondary | ICD-10-CM | POA: Diagnosis not present

## 2017-03-11 DIAGNOSIS — L821 Other seborrheic keratosis: Secondary | ICD-10-CM | POA: Diagnosis not present

## 2017-03-11 DIAGNOSIS — L570 Actinic keratosis: Secondary | ICD-10-CM | POA: Diagnosis not present

## 2017-05-26 ENCOUNTER — Other Ambulatory Visit: Payer: Medicare Other

## 2017-05-26 DIAGNOSIS — E782 Mixed hyperlipidemia: Secondary | ICD-10-CM

## 2017-05-26 DIAGNOSIS — E039 Hypothyroidism, unspecified: Secondary | ICD-10-CM

## 2017-05-26 DIAGNOSIS — R7303 Prediabetes: Secondary | ICD-10-CM | POA: Diagnosis not present

## 2017-05-26 DIAGNOSIS — N183 Chronic kidney disease, stage 3 unspecified: Secondary | ICD-10-CM

## 2017-05-26 LAB — COMPLETE METABOLIC PANEL WITH GFR
AG RATIO: 1.5 (calc) (ref 1.0–2.5)
ALBUMIN MSPROF: 3.9 g/dL (ref 3.6–5.1)
ALT: 11 U/L (ref 6–29)
AST: 14 U/L (ref 10–35)
Alkaline phosphatase (APISO): 57 U/L (ref 33–130)
BUN / CREAT RATIO: 20 (calc) (ref 6–22)
BUN: 23 mg/dL (ref 7–25)
CALCIUM: 9.3 mg/dL (ref 8.6–10.4)
CO2: 28 mmol/L (ref 20–32)
Chloride: 108 mmol/L (ref 98–110)
Creat: 1.13 mg/dL — ABNORMAL HIGH (ref 0.60–0.88)
GFR, EST AFRICAN AMERICAN: 52 mL/min/{1.73_m2} — AB (ref >=60)
GFR, EST NON AFRICAN AMERICAN: 45 mL/min/{1.73_m2} — AB (ref >=60)
GLOBULIN: 2.6 g/dL (ref 1.9–3.7)
Glucose, Bld: 103 mg/dL — ABNORMAL HIGH (ref 65–99)
Potassium: 4.1 mmol/L (ref 3.5–5.3)
SODIUM: 142 mmol/L (ref 135–146)
TOTAL PROTEIN: 6.5 g/dL (ref 6.1–8.1)
Total Bilirubin: 0.4 mg/dL (ref 0.2–1.2)

## 2017-05-26 LAB — CBC
HEMATOCRIT: 37.3 % (ref 35.0–45.0)
HEMOGLOBIN: 12.4 g/dL (ref 11.7–15.5)
MCH: 29 pg (ref 27.0–33.0)
MCHC: 33.2 g/dL (ref 32.0–36.0)
MCV: 87.4 fL (ref 80.0–100.0)
MPV: 9.6 fL (ref 7.5–12.5)
Platelets: 321 10*3/uL (ref 140–400)
RBC: 4.27 10*6/uL (ref 3.80–5.10)
RDW: 13.2 % (ref 11.0–15.0)
WBC: 5.3 10*3/uL (ref 3.8–10.8)

## 2017-06-02 ENCOUNTER — Non-Acute Institutional Stay: Payer: Medicare Other | Admitting: Internal Medicine

## 2017-06-02 ENCOUNTER — Encounter: Payer: Self-pay | Admitting: Internal Medicine

## 2017-06-02 VITALS — BP 126/62 | HR 73 | Temp 97.8°F | Resp 18 | Ht 61.0 in | Wt 134.8 lb

## 2017-06-02 DIAGNOSIS — R7303 Prediabetes: Secondary | ICD-10-CM

## 2017-06-02 DIAGNOSIS — K5909 Other constipation: Secondary | ICD-10-CM

## 2017-06-02 DIAGNOSIS — Z23 Encounter for immunization: Secondary | ICD-10-CM

## 2017-06-02 DIAGNOSIS — E039 Hypothyroidism, unspecified: Secondary | ICD-10-CM

## 2017-06-02 DIAGNOSIS — Z7189 Other specified counseling: Secondary | ICD-10-CM | POA: Diagnosis not present

## 2017-06-02 DIAGNOSIS — E782 Mixed hyperlipidemia: Secondary | ICD-10-CM

## 2017-06-02 DIAGNOSIS — Z0000 Encounter for general adult medical examination without abnormal findings: Secondary | ICD-10-CM | POA: Diagnosis not present

## 2017-06-02 DIAGNOSIS — N289 Disorder of kidney and ureter, unspecified: Secondary | ICD-10-CM | POA: Diagnosis not present

## 2017-06-02 DIAGNOSIS — M8588 Other specified disorders of bone density and structure, other site: Secondary | ICD-10-CM

## 2017-06-02 MED ORDER — DOCUSATE SODIUM 100 MG PO CAPS: 100.0000 mg | ORAL_CAPSULE | Freq: Every day | ORAL | 0 refills | Status: DC

## 2017-06-02 MED ORDER — TETANUS-DIPHTH-ACELL PERTUSSIS 5-2-15.5 LF-MCG/0.5 IM SUSP: 0.5000 mL | Freq: Once | INTRAMUSCULAR | 0 refills | Status: AC

## 2017-06-02 MED ORDER — LEVOTHYROXINE SODIUM 75 MCG PO TABS: ORAL_TABLET | ORAL | 3 refills | Status: DC

## 2017-06-02 NOTE — Progress Notes (Addendum)
Provider:  Blanchie Serve MD Location: [place] of Service:  Clinic (12)   PCP: Blanchie Serve, MD Patient Care Team: Blanchie Serve, MD as PCP - General (Internal Medicine) [name] [name], [name], NP as Nurse Practitioner (Nurse Practitioner) [name], MD as Consulting Physician (Ophthalmology) [name], MD as Consulting Physician (Orthopedic Surgery) [name], [name], MD as Consulting Physician (Dermatology) [name], MD (Inactive) as Consulting Physician (Gastroenterology) [name], MD as Consulting Physician (General Surgery)  Extended Emergency Contact Information Primary Emergency Contact: Vicenta Dunning, [place] [employer] Phone: [phone] Relation: Daughter  Code Status: FULL CODE  Goals of Care: Advanced Directive information Advanced Directives 06/02/2017  Does Patient Have a Medical Advance Directive? Yes  Type of Advance Directive Living will;Healthcare Power of [place];Out of facility DNR (pink MOST or yellow form)  Does patient want to make changes to medical advance directive? No - Patient declined  Copy of [place] in Chart? No - copy requested  Pre-existing out of facility DNR order (yellow form or pink MOST form) Pink MOST form placed in chart (order not valid for inpatient use)     Chief Complaint  Patient presents with  . Annual Exam    (R) leg has some pain when she tries to get from certain chairs.    HPI: Patient is a 84 y.o. female seen today for an annual comprehensive examination. She resides in independent living. She does not use assistive device. No fall reported.   OA- She complaints of pain on and off for 1 week to right thigh area from knee up while trying to stand from sitting position. Denies any fall or injury. She also has some pain to the knee. Denies pain to hip area. Currently on vitamin d supplement and calcium. Takes 2 of 500 mg  tylenol once a day to help with the pain and it helps.   Hypothyroidism- taking levothyroxine 75 mcg daily, tolerating it well. Needs new script.  b12 deficiency- takes b12 500 mcg daily.   Hyperlipidemia- takes Fish oil at present  Constipation- takes colace daily with prune, helps her, has a bowel movement every day  Past Medical History:  Diagnosis Date  . Abnormality of gait   . Cerebral atrophy 01/21/13  . Chest pain, unspecified   . Contusion of face, scalp, and neck except eye(s)   . Contusion of lower leg   . Corns and callosities   . Edema   . Family history of malignant neoplasm of breast   . Lumbago   . Osteoarthrosis, unspecified whether generalized or localized, unspecified site   . Osteoporosis, unspecified   . Other and unspecified hyperlipidemia   . Other conditions of brain(348.89)   . Other generalized ischemic cerebrovascular disease 01/21/13   small vessel disease  . Pain in joint, lower leg   . Personal history of fall   . Syncope and collapse   . Umbilical hernia without mention of obstruction or gangrene   . Unspecified constipation   . Unspecified hearing loss   . Unspecified hypothyroidism   . Unspecified vitamin D deficiency   . Urinary frequency   . Urinary tract infection, site not specified   . Ventral hernia, unspecified, without mention of obstruction or gangrene    Past Surgical History:  Procedure Laterality Date  . CATARACT EXTRACTION W/ INTRAOCULAR LENS  IMPLANT, BILATERAL  2012   Dr.  [place]  01/2008   laparoscopic Dr. [name]  . KNEE SURGERY  2008   Dr. Alyssa [place]  left  . TOE AMPUTATION  1986   little toe right foot  Dr. Dema Severin; [facility]    reports that she has never smoked. She has never used smokeless tobacco. She reports that she does not drink alcohol or use drugs. Social History   Socioeconomic History  . Marital status: Widowed    Spouse name: Not on file  . Number of children: Not on file  . Years  of education: Not on file  . Highest education level: Not on file  Occupational History  . Occupation: retired Psychologist, counselling: RETIRED  Social Needs  . Financial resource strain: Not hard at all  . Food insecurity:    Worry: Never true    Inability: Never true  . Transportation needs:    Medical: No    Non-medical: No  Tobacco Use  . Smoking status: Never Smoker  . Smokeless tobacco: Never Used  Substance and Sexual Activity  . Alcohol use: No  . Drug use: No  . Sexual activity: Never  Lifestyle  . Physical activity:    Days per week: 6 days    Minutes per session: 20 min  . Stress: Not at all  Relationships  . Social connections:    Talks on phone: Twice a week    Gets together: Three times a week    Attends religious service: More than 4 times per year    Active member of club or organization: No    Attends meetings of clubs or organizations: Never    Relationship status: Widowed  Other Topics Concern  . Not on file  Social History Narrative   Lives at [facility]/2005   Married husband in [place]   Exercise : water exercise 2-3 times a week   Never smoked   No alcohol    No family history on file.  Pertinent  Health Maintenance Due  Topic Date Due  . PNA vac Low Risk Adult (2 of 2 - PCV13) 02/08/2008  . INFLUENZA VACCINE  08/27/2017  . DEXA SCAN  Completed   Fall Risk  02/03/2017 01/08/2017 10/28/2016 08/22/2016 05/24/2015  Falls in the past year? _0   Comment - - - Emmi Telephone Survey: data to providers prior to load -   Depression screen [facility] 2/9 01/08/2017 10/28/2016 04/12/2015 06/08/2014 06/23/2013  Decreased Interest 0 0 0 0 0  Down, Depressed, Hopeless 0 0 0 0 0  PHQ - 2 Score 0 0 0 0 0  Altered sleeping - 0 - - -  Tired, decreased energy - 0 - - -  Change in appetite - 0 - - -  Feeling bad or failure about yourself  - 0 - - -  Trouble concentrating - 0 - - -  Moving slowly or fidgety/restless - 0 -  - -  Suicidal thoughts - 0 - - -  PHQ-9 Score - 0 - - -    Functional Status Survey:    Allergies  Allergen Reactions  . Mushroom Extract Complex   . Penicillins Diarrhea    Allergies as of 06/02/2017      Reactions   Mushroom Extract Complex    Penicillins Diarrhea      Medication List        Accurate as of 06/02/17 10:58 AM. Always  use your most recent med list.          aspirin 81 MG tablet Take 81 mg by mouth daily.   CALCIUM 600+D 600-200 MG-UNIT Tabs Generic drug:  Calcium Carbonate-Vitamin D Take 1 tablet by mouth 2 (two) times daily.   cholecalciferol 1000 units tablet Commonly known as:  VITAMIN D Take 1,000 Units by mouth daily.   docusate sodium 100 MG capsule Commonly known as:  COLACE Take 1 capsule (100 mg total) by mouth daily.   Fish Oil 1200 MG Caps Take 1 capsule by mouth daily.   levothyroxine 75 MCG tablet Commonly known as:  SYNTHROID, LEVOTHROID Take one tablet by mouth once daily 30 minutes before breakfast for thyroid   multivitamin with minerals Tabs tablet Take 1 tablet by mouth daily.   Tdap 05-28-13.5 LF-MCG/0.5 injection Commonly known as:  ADACEL Inject 0.5 mLs into the muscle once for 1 dose.   vitamin B-12 500 MCG tablet Commonly known as:  CYANOCOBALAMIN Take 500 mcg by mouth daily.       Review of Systems  Constitutional: Negative for appetite change, chills, fatigue and fever.  HENT: Positive for hearing loss and rhinorrhea. Negative for congestion, ear discharge, ear pain, mouth sores, sinus pressure, sore throat and trouble swallowing.   Eyes: Negative for pain, discharge and visual disturbance.       Wears corrective lenses, has some itching around her eyes, sees her ophthalmology once a year  Respiratory: Negative for cough, shortness of breath and wheezing.   Cardiovascular: Positive for leg swelling. Negative for chest pain and palpitations.       Some swelling around her ankle at times  Gastrointestinal:  Positive for constipation. Negative for abdominal pain, blood in stool, diarrhea, nausea and vomiting.       Colace and prune helps her, takes one every night  Genitourinary: Positive for frequency. Negative for dysuria and hematuria.       Wakes up 2-3 times at night to urinate  Musculoskeletal: Positive for arthralgias and back pain. Negative for gait problem.       Pain to lower back after sitting in one position for long time, after movement pain subsides. Denies any fall  Skin: Negative for rash and wound.  Neurological: Negative for dizziness, seizures, weakness, numbness and headaches.  Psychiatric/Behavioral: Negative for behavioral problems, confusion, dysphoric mood and sleep disturbance. The patient is not nervous/anxious.     Vitals:   06/02/17 0952  BP: 126/62  Pulse: 73  Resp: 18  Temp: 97.8 F (36.6 C)  SpO2: 98%  Weight: 134 lb 12.8 oz (61.1 kg)  Height: '5\' 1"'$  (1.549 m)   Body mass index is 25.47 kg/m.   Wt Readings from Last 3 Encounters:  06/02/17 134 lb 12.8 oz (61.1 kg)  02/03/17 133 lb 9.6 oz (60.6 kg)  01/08/17 133 lb (60.3 kg)   Physical Exam  Constitutional: She is oriented to person, place, and time. She appears well-developed and well-nourished. No distress.  HENT:  Head: Normocephalic and atraumatic.  Right Ear: External ear normal.  Left Ear: External ear normal.  Nose: Nose normal.  Mouth/Throat: Oropharynx is clear and moist. No oropharyngeal exudate.  Hearing aids to both ears. Mild oropharyngeal erythema.  Eyes: Pupils are equal, round, and reactive to light. Conjunctivae and EOM are normal. Right eye exhibits no discharge. Left eye exhibits no discharge.  Has corrective glasses  Neck: Normal range of motion. Neck supple.  Cardiovascular: Normal rate, regular rhythm and intact  distal pulses.  Murmur heard. Pulmonary/Chest: Effort normal and breath sounds normal. No respiratory distress. She has no wheezes. She has no rales. She exhibits no  tenderness.  Abdominal: Soft. Bowel sounds are normal. There is no tenderness. There is no guarding. A hernia is present.  Musculoskeletal: Normal range of motion. She exhibits no edema or tenderness.  Able to move all 4 extremities  Lymphadenopathy:    She has no cervical adenopathy.  Neurological: She is alert and oriented to person, place, and time. She displays normal reflexes. No cranial nerve deficit or sensory deficit. She exhibits normal muscle tone.  Normal pinprick sensation, normal vibration sensation  Skin: Skin is warm and dry. She is not diaphoretic.  Psychiatric: She has a normal mood and affect. Her behavior is normal.    Labs reviewed: Basic Metabolic Panel: Recent Labs    10/21/16 0000 01/29/17 0715 05/26/17 0720  NA 141 140 142  K 4.4 4.2 4.1  CL 105 106 108  CO2 _0 GLUCOSE 104* 93 103*  BUN _1 CREATININE 1.13* 1.14* 1.13*  CALCIUM 9.6 9.3 9.3   Liver Function Tests: Recent Labs    10/21/16 0000 01/29/17 0715 05/26/17 0720  AST _2 ALT _3 BILITOT 0.5 0.5 0.4  PROT 7.0 6.4 6.5   No results for input(s): LIPASE, AMYLASE in the last 8760 hours. No results for input(s): AMMONIA in the last 8760 hours. CBC: Recent Labs    10/21/16 0000 05/26/17 0720  WBC 5.9 5.3  NEUTROABS 2,926  --   HGB 13.2 12.4  HCT 39.6 37.3  MCV 88.4 87.4  PLT 359 321   Cardiac Enzymes: No results for input(s): CKTOTAL, CKMB, CKMBINDEX, TROPONINI in the last 8760 hours. BNP: Invalid input(s): POCBNP Lab Results  Component Value Date   HGBA1C 5.8 (H) 01/29/2017   Lab Results  Component Value Date   TSH 1.34 01/29/2017   Lab Results  Component Value Date   VITAMINB12 1,045 01/29/2017   No results found for: FOLATE No results found for: IRON, TIBC, FERRITIN   Lipid Panel     Component Value Date/Time   CHOL 214 (H) 01/29/2017 0715   TRIG 162 (H) 01/29/2017 0715   HDL 59 01/29/2017 0715   CHOLHDL 3.6 01/29/2017 0715   LDLCALC 127  (H) 01/29/2017 0715    Imaging and Procedures obtained recently: No results found.  Assessment/Plan  1. Hypothyroidism, unspecified type TSH stable.  - levothyroxine (SYNTHROID, LEVOTHROID) 75 MCG tablet; Take one tablet by mouth once daily 30 minutes before breakfast for thyroid  Dispense: 90 tablet; Refill: 3 - Lipid Panel; Future - CMP with eGFR([facility]); Future - TSH; Future  2. Need for prophylactic vaccination using diphtheria, tetanus, and acellular pertussis (DTaP) vaccine - Tdap (ADACEL) 05-28-13.5 LF-MCG/0.5 injection; Inject 0.5 mLs into the muscle once for 1 dose.  Dispense: 0.5 mL; Refill: 0  3. Impaired renal function Unclear etiology. Off NSAIDs now. Maintain hydration.  - Ambulatory referral to Nephrology - CMP with eGFR([facility]); Future  4. Annual physical exam the patient was counseled regarding prevention of dental and periodontal disease, diet, regular sustained exercise for at least 30 minutes 5 times per week, the proper use of sunscreen and protective clothing and recommended schedule for cholesterol, thyroid and diabetes screening. Script for tdap sent to pharmacy. EKG could not be performed today with machine not working. Will be performed on next visit.  Immunization History  Administered Date(s) Administered  .  Influenza Whole 10/28/2011, 11/10/2012  . Influenza, High Dose Seasonal PF 11/05/2016  . Influenza-Unspecified 11/14/2013, 10/26/2014, 11/08/2015  . Pneumococcal Polysaccharide-23 02/08/2007  . Td 01/28/1991  . Zoster 12/27/2012    5. Constipation, chronic Continue colace and prune  6. Mixed hyperlipidemia Recheck lipid and consider treatment option on review of lab. Counsel on diet.  - Lipid Panel; Future  7. Prediabetes counselling on diet provided.  - Lipid Panel; Future - CMP with eGFR([facility]); Future - Hemoglobin A1c; Future  8. Osteopenia of lumbar spine Continue calcium and vit d, uptodate on dexa  9. Goals of care,  counseling/discussion Requested copy of living will and HCPOA. Reviewed MOST form. Patient wants to talk with daughter and update MOST form. At present, she is a full code.   Family/ staff Communication: reviewed care plan with patient , charge nurse  Labs/tests ordered:  Lab Orders     Lipid Panel     CMP with eGFR([facility])     Hemoglobin A1c     TSH  Blanchie Serve, MD Internal Medicine [facility] [address] [place], [place] [address] Cell Phone (Monday-Friday 8 am - 5 pm): [phone] On Call: [phone] and follow prompts after 5 pm and on weekends Office Phone: [phone] Office Fax: [phone]   06/04/17 EKG normal sinus rhythm, HR 79 bpm. compared with EKG of 2014, no changes.

## 2017-06-02 NOTE — Patient Instructions (Addendum)
Please bring a copy of your living will and health care power of attorney paperwork for our records on your next visit.   You will get a call from our office regarding appointment with kidney doctor.

## 2017-06-04 ENCOUNTER — Encounter: Payer: Self-pay | Admitting: Internal Medicine

## 2017-06-09 ENCOUNTER — Other Ambulatory Visit: Payer: Self-pay | Admitting: *Deleted

## 2017-06-09 DIAGNOSIS — E039 Hypothyroidism, unspecified: Secondary | ICD-10-CM

## 2017-06-09 MED ORDER — LEVOTHYROXINE SODIUM 75 MCG PO TABS: ORAL_TABLET | ORAL | 3 refills | Status: DC

## 2017-06-15 DIAGNOSIS — H11042 Peripheral pterygium, stationary, left eye: Secondary | ICD-10-CM | POA: Diagnosis not present

## 2017-06-15 DIAGNOSIS — H5213 Myopia, bilateral: Secondary | ICD-10-CM | POA: Diagnosis not present

## 2017-06-15 DIAGNOSIS — H524 Presbyopia: Secondary | ICD-10-CM | POA: Diagnosis not present

## 2017-06-15 DIAGNOSIS — H04123 Dry eye syndrome of bilateral lacrimal glands: Secondary | ICD-10-CM | POA: Diagnosis not present

## 2017-06-15 DIAGNOSIS — H52223 Regular astigmatism, bilateral: Secondary | ICD-10-CM | POA: Diagnosis not present

## 2017-06-15 DIAGNOSIS — H35373 Puckering of macula, bilateral: Secondary | ICD-10-CM | POA: Diagnosis not present

## 2017-06-15 DIAGNOSIS — Z961 Presence of intraocular lens: Secondary | ICD-10-CM | POA: Diagnosis not present

## 2017-07-07 ENCOUNTER — Other Ambulatory Visit: Payer: Medicare Other

## 2017-07-07 DIAGNOSIS — N289 Disorder of kidney and ureter, unspecified: Secondary | ICD-10-CM | POA: Diagnosis not present

## 2017-07-07 DIAGNOSIS — E782 Mixed hyperlipidemia: Secondary | ICD-10-CM

## 2017-07-07 DIAGNOSIS — R7303 Prediabetes: Secondary | ICD-10-CM

## 2017-07-07 DIAGNOSIS — E039 Hypothyroidism, unspecified: Secondary | ICD-10-CM | POA: Diagnosis not present

## 2017-07-08 LAB — COMPLETE METABOLIC PANEL WITH GFR
AG Ratio: 1.5 (calc) (ref 1.0–2.5)
ALKALINE PHOSPHATASE (APISO): 49 U/L (ref 33–130)
ALT: 12 U/L (ref 6–29)
AST: 16 U/L (ref 10–35)
Albumin: 4 g/dL (ref 3.6–5.1)
BUN/Creatinine Ratio: 17 (calc) (ref 6–22)
BUN: 19 mg/dL (ref 7–25)
CHLORIDE: 108 mmol/L (ref 98–110)
CO2: 26 mmol/L (ref 20–32)
CREATININE: 1.09 mg/dL — AB (ref 0.60–0.88)
Calcium: 9.3 mg/dL (ref 8.6–10.4)
GFR, Est African American: 54 mL/min/{1.73_m2} — ABNORMAL LOW (ref >=60)
GFR, Est Non African American: 47 mL/min/{1.73_m2} — ABNORMAL LOW (ref >=60)
Globulin: 2.6 g/dL (calc) (ref 1.9–3.7)
Glucose, Bld: 109 mg/dL — ABNORMAL HIGH (ref 65–99)
POTASSIUM: 4.3 mmol/L (ref 3.5–5.3)
SODIUM: 139 mmol/L (ref 135–146)
Total Bilirubin: 0.4 mg/dL (ref 0.2–1.2)
Total Protein: 6.6 g/dL (ref 6.1–8.1)

## 2017-07-08 LAB — LIPID PANEL
CHOL/HDL RATIO: 3.5 (calc) (ref <5.0)
CHOLESTEROL: 209 mg/dL — AB (ref <200)
HDL: 59 mg/dL (ref >50)
LDL Cholesterol (Calc): 124 mg/dL (calc) — ABNORMAL HIGH
Non-HDL Cholesterol (Calc): 150 mg/dL (calc) — ABNORMAL HIGH (ref <130)
Triglycerides: 146 mg/dL (ref <150)

## 2017-07-08 LAB — HEMOGLOBIN A1C
EAG (MMOL/L): 6.5 (calc)
Hgb A1c MFr Bld: 5.7 % of total Hgb — ABNORMAL HIGH (ref <5.7)
MEAN PLASMA GLUCOSE: 117 (calc)

## 2017-07-08 LAB — TSH: TSH: 2.88 mIU/L (ref 0.40–4.50)

## 2017-09-09 ENCOUNTER — Encounter: Payer: Self-pay | Admitting: Internal Medicine

## 2017-09-29 ENCOUNTER — Non-Acute Institutional Stay: Payer: Medicare Other | Admitting: Internal Medicine

## 2017-09-29 ENCOUNTER — Other Ambulatory Visit: Payer: Self-pay | Admitting: *Deleted

## 2017-09-29 ENCOUNTER — Encounter: Payer: Self-pay | Admitting: Internal Medicine

## 2017-09-29 VITALS — BP 120/62 | HR 58 | Temp 97.9°F | Resp 18 | Ht 61.0 in | Wt 128.4 lb

## 2017-09-29 DIAGNOSIS — K5900 Constipation, unspecified: Secondary | ICD-10-CM | POA: Diagnosis not present

## 2017-09-29 DIAGNOSIS — R7303 Prediabetes: Secondary | ICD-10-CM

## 2017-09-29 DIAGNOSIS — N3281 Overactive bladder: Secondary | ICD-10-CM | POA: Diagnosis not present

## 2017-09-29 DIAGNOSIS — E785 Hyperlipidemia, unspecified: Secondary | ICD-10-CM

## 2017-09-29 DIAGNOSIS — N183 Chronic kidney disease, stage 3 unspecified: Secondary | ICD-10-CM

## 2017-09-29 DIAGNOSIS — M8588 Other specified disorders of bone density and structure, other site: Secondary | ICD-10-CM

## 2017-09-29 DIAGNOSIS — E039 Hypothyroidism, unspecified: Secondary | ICD-10-CM | POA: Diagnosis not present

## 2017-09-29 DIAGNOSIS — E782 Mixed hyperlipidemia: Secondary | ICD-10-CM

## 2017-09-29 DIAGNOSIS — E538 Deficiency of other specified B group vitamins: Secondary | ICD-10-CM

## 2017-09-29 MED ORDER — ATORVASTATIN CALCIUM 10 MG PO TABS: 10.0000 mg | ORAL_TABLET | Freq: Every day | ORAL | 3 refills | Status: DC

## 2017-09-29 MED ORDER — MIRABEGRON ER 25 MG PO TB24: 25.0000 mg | ORAL_TABLET | Freq: Every day | ORAL | 3 refills | Status: DC

## 2017-09-29 NOTE — Progress Notes (Signed)
Location:  [place] of Service:  Clinic (12) Provider:  Blanchie Serve MD  Blanchie Serve, MD  Patient Care Team: Blanchie Serve, MD as PCP - General (Internal Medicine) [name] [name], [name], NP as Nurse Practitioner (Nurse Practitioner) [name], MD as Consulting Physician (Ophthalmology) [name], MD as Consulting Physician (Orthopedic Surgery) [name], [name], MD as Consulting Physician (Dermatology) [name], MD (Inactive) as Consulting Physician (Gastroenterology) [name], MD as Consulting Physician (General Surgery)  Extended Emergency Contact Information Primary Emergency Contact: Vicenta Dunning, [place] [employer] Phone: [phone] Relation: Daughter  Code Status:  DNR  Goals of care: Advanced Directive information Advanced Directives 06/02/2017  Does Patient Have a Medical Advance Directive? Yes  Type of Advance Directive Living will;Healthcare Power of [place];Out of facility DNR (pink MOST or yellow form)  Does patient want to make changes to medical advance directive? No - Patient declined  Copy of [place] in Chart? No - copy requested  Pre-existing out of facility DNR order (yellow form or pink MOST form) Pink MOST form placed in chart (order not valid for inpatient use)     Chief Complaint  Patient presents with  . Medical Management of Chronic Issues    4 mof/u- has questions regarding labs    HPI:  Pt is a 84 y.o. female seen today for medical management of chronic diseases.   Hypothyroidism- taking levothyroxine 75 mcg daily. Has cold intolerance. Has constipation and stool softner helps. Energy level is good.   b12 deficiency- taking b12 supplement. Energy level is good. Denies numbness or tingling.   Hyperlipidemia- takes fish oil. Does aquatic exercise 3 days a week, walks for exercise. Tries to avoid fried food and junk food.    constipation- on colace 100 mg daily and takes prune juice. Has a bowel movement everyday.   Osteopenia- taking calcium and vitamin d supplement. She caught her ankle in desk drawer and fell, also sustained a cut to her eyebrow. This was last week.   ckd stage 3- does not take NSAIDs. Keeping herself hydrated.    Past Medical History:  Diagnosis Date  . Abnormality of gait   . Cerebral atrophy 01/21/13  . Chest pain, unspecified   . Contusion of face, scalp, and neck except eye(s)   . Contusion of lower leg   . Corns and callosities   . Edema   . Family history of malignant neoplasm of breast   . Lumbago   . Osteoarthrosis, unspecified whether generalized or localized, unspecified site   . Osteoporosis, unspecified   . Other and unspecified hyperlipidemia   . Other conditions of brain(348.89)   . Other generalized ischemic cerebrovascular disease 01/21/13   small vessel disease  . Pain in joint, lower leg   . Personal history of fall   . Syncope and collapse   . Umbilical hernia without mention of obstruction or gangrene   . Unspecified constipation   . Unspecified hearing loss   . Unspecified hypothyroidism   . Unspecified vitamin D deficiency   . Urinary frequency   . Urinary tract infection, site not specified   . Ventral hernia, unspecified, without mention of obstruction or gangrene    Past Surgical History:  Procedure Laterality Date  . CATARACT EXTRACTION W/ INTRAOCULAR LENS  IMPLANT, BILATERAL  2012   Dr. Bing Plume  . HERNIA REPAIR  01/2008   laparoscopic  Dr. [name]  . KNEE SURGERY  2008   Dr. Alyssa Grove  left  . TOE AMPUTATION  1986   little toe right foot  Dr. Dema Severin; [place]    Allergies  Allergen Reactions  . Mushroom Extract Complex   . Penicillins Diarrhea    Outpatient Encounter Medications as of 09/29/2017  Medication Sig  . aspirin 81 MG tablet Take 81 mg by mouth daily.  . Calcium Carbonate-Vitamin D (CALCIUM 600+D) 600-200 MG-UNIT TABS  Take 1 tablet by mouth 2 (two) times daily.  . cholecalciferol (VITAMIN D) 1000 UNITS tablet Take 1,000 Units by mouth daily.  Marland Kitchen docusate sodium (COLACE) 100 MG capsule Take 1 capsule (100 mg total) by mouth daily.  Marland Kitchen levothyroxine (SYNTHROID, LEVOTHROID) 75 MCG tablet Take one tablet by mouth once daily 30 minutes before breakfast for thyroid  . Multiple Vitamin (MULTIVITAMIN WITH MINERALS) TABS Take 1 tablet by mouth daily.  . Omega-3 Fatty Acids (FISH OIL) 1200 MG CAPS Take 1 capsule by mouth daily.  . vitamin B-12 (CYANOCOBALAMIN) 500 MCG tablet Take 500 mcg by mouth daily.   No facility-administered encounter medications on file as of 09/29/2017.     Review of Systems  Constitutional: Negative for appetite change, chills, fatigue and fever.  HENT: Positive for hearing loss. Negative for congestion, ear discharge, ear pain, mouth sores, postnasal drip, rhinorrhea, sinus pressure, sinus pain, sore throat and trouble swallowing.   Eyes: Positive for visual disturbance. Negative for pain, discharge and itching.       Has corrective eye glasses  Respiratory: Negative for cough and shortness of breath.   Cardiovascular: Negative for chest pain, palpitations and leg swelling.  Gastrointestinal: Positive for constipation. Negative for abdominal pain, blood in stool, diarrhea, rectal pain and vomiting.  Genitourinary: Positive for frequency. Negative for dysuria and hematuria.       Wakes up 3-4 times at night to urinate  Musculoskeletal: Positive for back pain. Negative for arthralgias, gait problem and joint swelling.       Had a fall last week that was mechanical fall  Skin: Negative for rash and wound.  Neurological: Negative for dizziness, syncope, weakness, light-headedness, numbness and headaches.  Hematological: Bruises/bleeds easily.  Psychiatric/Behavioral: Negative for behavioral problems, confusion, decreased concentration, dysphoric mood and sleep disturbance. The patient is not  nervous/anxious.     Immunization History  Administered Date(s) Administered  . Influenza Whole 10/28/2011, 11/10/2012  . Influenza, High Dose Seasonal PF 11/05/2016  . Influenza-Unspecified 11/14/2013, 10/26/2014, 11/08/2015  . Pneumococcal Conjugate-13 02/05/2017  . Pneumococcal Polysaccharide-23 02/08/2007  . Td 01/28/1991, 06/12/2017  . Zoster 12/27/2012   Pertinent  Health Maintenance Due  Topic Date Due  . INFLUENZA VACCINE  08/27/2017  . DEXA SCAN  Completed  . PNA vac Low Risk Adult  Completed   Fall Risk  02/03/2017 01/08/2017 10/28/2016 08/22/2016 05/24/2015  Falls in the past year? _0   Comment - - - [place]i Telephone Survey: data to providers prior to load -   Functional Status Survey:    Vitals:   09/29/17 0840  BP: 120/62  Pulse: (!) 58  Resp: 18  Temp: 97.9 F (36.6 C)  SpO2: 96%  Weight: 128 lb 6.4 oz (58.2 kg)  Height: _1  (1.549 m)   Body mass index is 24.26 kg/m.   Wt Readings from Last 3 Encounters:  09/29/17 128 lb 6.4 oz (58.2 kg)  06/02/17 134 lb 12.8 oz (61.1 kg)  02/03/17 133 lb 9.6 oz (  60.6 kg)   Physical Exam  Constitutional: She is oriented to person, place, and time. She appears well-developed and well-nourished. No distress.  HENT:  Head: Normocephalic and atraumatic.  Right Ear: External ear normal.  Left Ear: External ear normal.  Nose: Nose normal.  Mouth/Throat: Oropharynx is clear and moist. No oropharyngeal exudate.  Hearing aid present  Eyes: Pupils are equal, round, and reactive to light. Conjunctivae and EOM are normal. Right eye exhibits no discharge. Left eye exhibits no discharge.  Corrective glasses  Neck: Normal range of motion. Neck supple.  Cardiovascular: Normal rate, regular rhythm and intact distal pulses.  Pulmonary/Chest: Effort normal and breath sounds normal. No respiratory distress. She has no wheezes. She has no rales.  Abdominal: Soft. Bowel sounds are normal. There is no tenderness. There is no  guarding.  Musculoskeletal: Normal range of motion. She exhibits edema.  Lymphadenopathy:    She has no cervical adenopathy.  Neurological: She is alert and oriented to person, place, and time. She exhibits normal muscle tone.  02/03/17 mmse 30/30  Skin: Skin is warm and dry. Capillary refill takes less than 2 seconds. She is not diaphoretic.  Psychiatric: She has a normal mood and affect.    Labs reviewed: Recent Labs    01/29/17 0715 05/26/17 0720 07/07/17 0700  NA 140 142 139  K 4.2 4.1 4.3  CL 106 108 108  CO2 _0 GLUCOSE 93 103* 109*  BUN _1 CREATININE 1.14* 1.13* 1.09*  CALCIUM 9.3 9.3 9.3   Recent Labs    01/29/17 0715 05/26/17 0720 07/07/17 0700  AST _2 ALT _3 BILITOT 0.5 0.4 0.4  PROT 6.4 6.5 6.6   Recent Labs    10/21/16 0000 05/26/17 0720  WBC 5.9 5.3  NEUTROABS 2,926  --   HGB 13.2 12.4  HCT 39.6 37.3  MCV 88.4 87.4  PLT 359 321   Lab Results  Component Value Date   TSH 2.88 07/07/2017   Lab Results  Component Value Date   HGBA1C 5.7 (H) 07/07/2017   Lab Results  Component Value Date   CHOL 209 (H) 07/07/2017   HDL 59 07/07/2017   LDLCALC 124 (H) 07/07/2017   TRIG 146 07/07/2017   CHOLHDL 3.5 07/07/2017    Significant Diagnostic Results in last 30 days:  No results found.  Assessment/Plan  1. Acquired hypothyroidism Check TSH in 3 months. Continue levothyroxine for now  2. Osteopenia of lumbar spine Continue calcium and vitamin d supplement  3. Constipation, unspecified constipation type Continue colace, maintain hydration  4. Mixed hyperlipidemia Start atorvastatin 10 mg daily for now. Monitor cmp and lipid panel in 3 months - atorvastatin (LIPITOR) 10 MG tablet; Take 1 tablet (10 mg total) by mouth daily.  Dispense: 90 tablet; Refill: 3  5. CKD (chronic kidney disease) stage 3, GFR 30-59 ml/min (HCC) Check bmp. Avoid NSAIDs. Monitor clinically, awaiting renal f/u  6. Prediabetes Reviewed last  a1c, check a1c, diet and exercise counselling, continue aspirin. Starting satin. bp stable.   7. OAB (overactive bladder) With incontinence episode. Start myrbetriq as below.  - mirabegron ER (MYRBETRIQ) 25 MG TB24 tablet; Take 1 tablet (25 mg total) by mouth daily.  Dispense: 90 tablet; Refill: 3  8. B12 deficiency Continue b12 supplement    Family/ staff Communication: reviewed care plan with patient    Labs/tests ordered:  A1c, lipid, cbc, cmp with eGFR, TSH   Allexa Acoff, MD  Internal Medicine [facility] [address] [place], [place] [address] Cell Phone (Monday-Friday 8 am - 5 pm): [phone] On Call: [phone] and follow prompts after 5 pm and on weekends Office Phone: [phone] Office Fax: [phone]

## 2017-10-05 DIAGNOSIS — R7303 Prediabetes: Secondary | ICD-10-CM | POA: Diagnosis not present

## 2017-10-05 DIAGNOSIS — N183 Chronic kidney disease, stage 3 (moderate): Secondary | ICD-10-CM | POA: Diagnosis not present

## 2017-10-06 ENCOUNTER — Other Ambulatory Visit: Payer: Medicare Other

## 2017-10-06 DIAGNOSIS — N183 Chronic kidney disease, stage 3 unspecified: Secondary | ICD-10-CM

## 2017-10-06 DIAGNOSIS — R7303 Prediabetes: Secondary | ICD-10-CM

## 2017-10-07 LAB — COMPLETE METABOLIC PANEL WITH GFR
AG Ratio: 1.4 (calc) (ref 1.0–2.5)
ALKALINE PHOSPHATASE (APISO): 63 U/L (ref 33–130)
ALT: 13 U/L (ref 6–29)
AST: 17 U/L (ref 10–35)
Albumin: 3.9 g/dL (ref 3.6–5.1)
BUN/Creatinine Ratio: 18 (calc) (ref 6–22)
BUN: 18 mg/dL (ref 7–25)
CALCIUM: 9.8 mg/dL (ref 8.6–10.4)
CO2: 27 mmol/L (ref 20–32)
CREATININE: 1.02 mg/dL — AB (ref 0.60–0.88)
Chloride: 106 mmol/L (ref 98–110)
GFR, EST NON AFRICAN AMERICAN: 50 mL/min/{1.73_m2} — AB (ref >=60)
GFR, Est African American: 58 mL/min/{1.73_m2} — ABNORMAL LOW (ref >=60)
Globulin: 2.8 g/dL (calc) (ref 1.9–3.7)
Glucose, Bld: 101 mg/dL — ABNORMAL HIGH (ref 65–99)
POTASSIUM: 4.5 mmol/L (ref 3.5–5.3)
Sodium: 140 mmol/L (ref 135–146)
Total Bilirubin: 0.4 mg/dL (ref 0.2–1.2)
Total Protein: 6.7 g/dL (ref 6.1–8.1)

## 2017-10-07 LAB — HEMOGLOBIN A1C
EAG (MMOL/L): 6.8 (calc)
Hgb A1c MFr Bld: 5.9 % of total Hgb — ABNORMAL HIGH (ref <5.7)
Mean Plasma Glucose: 123 (calc)

## 2017-10-08 ENCOUNTER — Telehealth: Payer: Self-pay | Admitting: *Deleted

## 2017-10-08 NOTE — Telephone Encounter (Signed)
Patient called [facility]) and left message on Clinical Intake stating that she had concerns regarding 2 medication she was taking and wanted to talk with Dr. Bubba Camp.   The cost for 2 Rx is over $800.00. Patient stated that she spoke with Dr. Bubba Camp regarding the expense and she hasn't heard anything back. The Myrbetriq and Atorvastatin. Patient stated that she cannot afford this.  Please Advise.

## 2017-10-08 NOTE — Telephone Encounter (Signed)
  I would recommend discontinuing myrbetriq for now. Pharmacy should be able to provide atorvastatin. Let's try the statin first, see how she tolerates it. notfiy the patient of this.

## 2017-11-02 DIAGNOSIS — N183 Chronic kidney disease, stage 3 (moderate): Secondary | ICD-10-CM | POA: Diagnosis not present

## 2017-12-22 ENCOUNTER — Other Ambulatory Visit: Payer: Medicare Other

## 2017-12-22 DIAGNOSIS — E039 Hypothyroidism, unspecified: Secondary | ICD-10-CM | POA: Diagnosis not present

## 2017-12-22 DIAGNOSIS — N183 Chronic kidney disease, stage 3 unspecified: Secondary | ICD-10-CM

## 2017-12-22 DIAGNOSIS — R7303 Prediabetes: Secondary | ICD-10-CM

## 2017-12-22 DIAGNOSIS — E785 Hyperlipidemia, unspecified: Secondary | ICD-10-CM | POA: Diagnosis not present

## 2017-12-23 LAB — COMPLETE METABOLIC PANEL WITH GFR
AG Ratio: 1.7 (calc) (ref 1.0–2.5)
ALBUMIN MSPROF: 4 g/dL (ref 3.6–5.1)
ALKALINE PHOSPHATASE (APISO): 54 U/L (ref 33–130)
ALT: 15 U/L (ref 6–29)
AST: 15 U/L (ref 10–35)
BILIRUBIN TOTAL: 0.4 mg/dL (ref 0.2–1.2)
BUN/Creatinine Ratio: 20 (calc) (ref 6–22)
BUN: 20 mg/dL (ref 7–25)
CHLORIDE: 106 mmol/L (ref 98–110)
CO2: 29 mmol/L (ref 20–32)
Calcium: 9.4 mg/dL (ref 8.6–10.4)
Creat: 1.01 mg/dL — ABNORMAL HIGH (ref 0.60–0.88)
GFR, Est African American: 59 mL/min/{1.73_m2} — ABNORMAL LOW (ref >=60)
GFR, Est Non African American: 51 mL/min/{1.73_m2} — ABNORMAL LOW (ref >=60)
GLUCOSE: 97 mg/dL (ref 65–99)
Globulin: 2.4 g/dL (calc) (ref 1.9–3.7)
POTASSIUM: 4.1 mmol/L (ref 3.5–5.3)
SODIUM: 143 mmol/L (ref 135–146)
Total Protein: 6.4 g/dL (ref 6.1–8.1)

## 2017-12-23 LAB — CBC
HCT: 37 % (ref 35.0–45.0)
HEMOGLOBIN: 12.1 g/dL (ref 11.7–15.5)
MCH: 29.2 pg (ref 27.0–33.0)
MCHC: 32.7 g/dL (ref 32.0–36.0)
MCV: 89.4 fL (ref 80.0–100.0)
MPV: 10.5 fL (ref 7.5–12.5)
PLATELETS: 301 10*3/uL (ref 140–400)
RBC: 4.14 10*6/uL (ref 3.80–5.10)
RDW: 13 % (ref 11.0–15.0)
WBC: 5.8 10*3/uL (ref 3.8–10.8)

## 2017-12-23 LAB — LIPID PANEL
Cholesterol: 135 mg/dL (ref <200)
HDL: 57 mg/dL (ref >50)
LDL CHOLESTEROL (CALC): 58 mg/dL
NON-HDL CHOLESTEROL (CALC): 78 mg/dL (ref <130)
Total CHOL/HDL Ratio: 2.4 (calc) (ref <5.0)
Triglycerides: 121 mg/dL (ref <150)

## 2017-12-23 LAB — HEMOGLOBIN A1C
Hgb A1c MFr Bld: 5.8 % of total Hgb — ABNORMAL HIGH (ref <5.7)
Mean Plasma Glucose: 120 (calc)
eAG (mmol/L): 6.6 (calc)

## 2017-12-23 LAB — TSH: TSH: 3.93 m[IU]/L (ref 0.40–4.50)

## 2017-12-31 ENCOUNTER — Encounter: Payer: Self-pay | Admitting: Nurse Practitioner

## 2018-01-05 ENCOUNTER — Telehealth: Payer: Self-pay | Admitting: Internal Medicine

## 2018-01-05 NOTE — Telephone Encounter (Signed)
I left a message asking the pt to call me at [phone] to schedule AWV at [facility] on the morning of 01/11/18 if available. VDM (DD)

## 2018-01-19 ENCOUNTER — Encounter: Payer: Medicare Other | Admitting: Family Medicine

## 2018-01-21 ENCOUNTER — Encounter: Payer: Medicare Other | Admitting: Nurse Practitioner

## 2018-02-24 ENCOUNTER — Encounter: Payer: Medicare Other | Admitting: Internal Medicine

## 2018-03-23 ENCOUNTER — Telehealth: Payer: Self-pay

## 2018-03-23 NOTE — Telephone Encounter (Signed)
Patient called to schedule a 6 month follow-up with Dr.Gupta. Patient questions if she should have labs prior. Patient informed I will consult with Dr.Gupta and her Medical Assistant.  Patient states she usually has labs prior to appointment  Please advise

## 2018-03-25 ENCOUNTER — Other Ambulatory Visit: Payer: Self-pay | Admitting: Internal Medicine

## 2018-03-25 DIAGNOSIS — E782 Mixed hyperlipidemia: Secondary | ICD-10-CM

## 2018-03-25 DIAGNOSIS — E039 Hypothyroidism, unspecified: Secondary | ICD-10-CM

## 2018-03-25 DIAGNOSIS — N183 Chronic kidney disease, stage 3 unspecified: Secondary | ICD-10-CM

## 2018-03-25 DIAGNOSIS — R609 Edema, unspecified: Secondary | ICD-10-CM

## 2018-03-26 NOTE — Telephone Encounter (Signed)
Please acknowledge

## 2018-03-31 NOTE — Telephone Encounter (Signed)
I  notified Krista Bowman on 03/25/2018 @ 2:10pm via staff message  that this message was sent to her. I will send again

## 2018-04-01 NOTE — Telephone Encounter (Signed)
I called patient and she scheduled an appointment for labs prior to her appointment

## 2018-04-13 ENCOUNTER — Other Ambulatory Visit: Payer: Self-pay

## 2018-04-13 ENCOUNTER — Other Ambulatory Visit: Payer: Medicare Other

## 2018-04-13 DIAGNOSIS — E039 Hypothyroidism, unspecified: Secondary | ICD-10-CM

## 2018-04-13 DIAGNOSIS — N183 Chronic kidney disease, stage 3 unspecified: Secondary | ICD-10-CM

## 2018-04-13 DIAGNOSIS — E782 Mixed hyperlipidemia: Secondary | ICD-10-CM | POA: Diagnosis not present

## 2018-04-13 LAB — COMPLETE METABOLIC PANEL WITH GFR
AG Ratio: 1.4 (calc) (ref 1.0–2.5)
ALBUMIN MSPROF: 3.8 g/dL (ref 3.6–5.1)
ALT: 10 U/L (ref 6–29)
AST: 14 U/L (ref 10–35)
Alkaline phosphatase (APISO): 58 U/L (ref 37–153)
BILIRUBIN TOTAL: 0.5 mg/dL (ref 0.2–1.2)
BUN / CREAT RATIO: 27 (calc) — AB (ref 6–22)
BUN: 27 mg/dL — AB (ref 7–25)
CO2: 27 mmol/L (ref 20–32)
Calcium: 9.4 mg/dL (ref 8.6–10.4)
Chloride: 106 mmol/L (ref 98–110)
Creat: 1.01 mg/dL — ABNORMAL HIGH (ref 0.60–0.88)
GFR, Est African American: 59 mL/min/{1.73_m2} — ABNORMAL LOW (ref >=60)
GFR, Est Non African American: 51 mL/min/{1.73_m2} — ABNORMAL LOW (ref >=60)
Globulin: 2.7 g/dL (calc) (ref 1.9–3.7)
Glucose, Bld: 98 mg/dL (ref 65–99)
Potassium: 4.2 mmol/L (ref 3.5–5.3)
Sodium: 141 mmol/L (ref 135–146)
Total Protein: 6.5 g/dL (ref 6.1–8.1)

## 2018-04-13 LAB — CBC WITH DIFFERENTIAL/PLATELET
Absolute Monocytes: 319 cells/uL (ref 200–950)
Basophils Absolute: 22 cells/uL (ref 0–200)
Basophils Relative: 0.4 %
Eosinophils Absolute: 200 cells/uL (ref 15–500)
Eosinophils Relative: 3.7 %
HCT: 38 % (ref 35.0–45.0)
Hemoglobin: 12.8 g/dL (ref 11.7–15.5)
Lymphs Abs: 2155 cells/uL (ref 850–3900)
MCH: 30.4 pg (ref 27.0–33.0)
MCHC: 33.7 g/dL (ref 32.0–36.0)
MCV: 90.3 fL (ref 80.0–100.0)
MPV: 10.5 fL (ref 7.5–12.5)
Monocytes Relative: 5.9 %
NEUTROS PCT: 50.1 %
Neutro Abs: 2705 cells/uL (ref 1500–7800)
PLATELETS: 322 10*3/uL (ref 140–400)
RBC: 4.21 10*6/uL (ref 3.80–5.10)
RDW: 12.8 % (ref 11.0–15.0)
Total Lymphocyte: 39.9 %
WBC: 5.4 10*3/uL (ref 3.8–10.8)

## 2018-04-13 LAB — LIPID PANEL
Cholesterol: 208 mg/dL — ABNORMAL HIGH (ref <200)
HDL: 62 mg/dL (ref >=50)
LDL Cholesterol (Calc): 126 mg/dL (calc) — ABNORMAL HIGH
NON-HDL CHOLESTEROL (CALC): 146 mg/dL — AB (ref <130)
Total CHOL/HDL Ratio: 3.4 (calc) (ref <5.0)
Triglycerides: 101 mg/dL (ref <150)

## 2018-04-13 LAB — TSH: TSH: 3.43 mIU/L (ref 0.40–4.50)

## 2018-04-23 ENCOUNTER — Encounter: Payer: Self-pay | Admitting: Internal Medicine

## 2018-04-23 ENCOUNTER — Other Ambulatory Visit: Payer: Self-pay

## 2018-04-23 ENCOUNTER — Non-Acute Institutional Stay: Payer: Medicare Other | Admitting: Nurse Practitioner

## 2018-04-23 ENCOUNTER — Encounter: Payer: Self-pay | Admitting: Nurse Practitioner

## 2018-04-23 DIAGNOSIS — R600 Localized edema: Secondary | ICD-10-CM | POA: Diagnosis not present

## 2018-04-23 DIAGNOSIS — M79604 Pain in right leg: Secondary | ICD-10-CM | POA: Diagnosis not present

## 2018-04-23 DIAGNOSIS — R634 Abnormal weight loss: Secondary | ICD-10-CM | POA: Diagnosis not present

## 2018-04-23 DIAGNOSIS — E782 Mixed hyperlipidemia: Secondary | ICD-10-CM | POA: Diagnosis not present

## 2018-04-23 DIAGNOSIS — K5900 Constipation, unspecified: Secondary | ICD-10-CM

## 2018-04-23 DIAGNOSIS — E538 Deficiency of other specified B group vitamins: Secondary | ICD-10-CM

## 2018-04-23 DIAGNOSIS — E039 Hypothyroidism, unspecified: Secondary | ICD-10-CM

## 2018-04-23 DIAGNOSIS — R7303 Prediabetes: Secondary | ICD-10-CM | POA: Diagnosis not present

## 2018-04-23 DIAGNOSIS — Z9181 History of falling: Secondary | ICD-10-CM | POA: Diagnosis not present

## 2018-04-23 DIAGNOSIS — R269 Unspecified abnormalities of gait and mobility: Secondary | ICD-10-CM | POA: Diagnosis not present

## 2018-04-23 DIAGNOSIS — N3281 Overactive bladder: Secondary | ICD-10-CM | POA: Diagnosis not present

## 2018-04-23 NOTE — Patient Instructions (Signed)
Will continue to monitor weight, diet, exercise. Referral to PT to eval and treat for the right leg above the knee pain.

## 2018-04-23 NOTE — Assessment & Plan Note (Signed)
Continue Levothyroxine 44mcg qd, last TSH 3.43 04/13/18.

## 2018-04-23 NOTE — Assessment & Plan Note (Signed)
Stable, Hgb 12.8 04/13/18, continue VitB12 571mcg qd.

## 2018-04-23 NOTE — Assessment & Plan Note (Signed)
#  4Ibs in the past 6 months, the patient denied GI or mood symptoms, TSH wnl 04/13/18. She stated she is on diet, exercise life style modification since she was informed her prediabetes condition. Will continue to observe.

## 2018-04-23 NOTE — Assessment & Plan Note (Signed)
LDL is not at goal, 126 04/13/18, the patient desires to continue Fish oil, diet, exercise, opt out Lipitor.

## 2018-04-23 NOTE — Assessment & Plan Note (Signed)
Pain above the knee when she getting in/out bed/car. Will have PT to eval and treat as indicated. May consider Ortho if no better.

## 2018-04-23 NOTE — Assessment & Plan Note (Signed)
Unsteady sometimes.

## 2018-04-23 NOTE — Assessment & Plan Note (Signed)
Stable, continue Colace qd.

## 2018-04-23 NOTE — Assessment & Plan Note (Signed)
Persists, nocturnal urination about 2-3 x/night, no difficulty returning to sleep.

## 2018-04-23 NOTE — Assessment & Plan Note (Signed)
2x several months ago, will refer to PT for eval and treatment.

## 2018-04-23 NOTE — Assessment & Plan Note (Signed)
Not apparent.  

## 2018-04-23 NOTE — Assessment & Plan Note (Signed)
Continue lifestyle modification.

## 2018-04-23 NOTE — Progress Notes (Signed)
Location:   clinic [place]   Place of Service:  Clinic (12) Provider: Marlana Latus NP  Code Status: DNR Goals of Care: [place] Advanced Directives 06/02/2017  Does Patient Have a Medical Advance Directive? Yes  Type of Advance Directive Living will;Healthcare Power of [place];Out of facility DNR (pink MOST or yellow form)  Does patient want to make changes to medical advance directive? No - Patient declined  Copy of [place] in Chart? No - copy requested  Pre-existing out of facility DNR order (yellow form or pink MOST form) Pink MOST form placed in chart (order not valid for inpatient use)     Chief Complaint  Patient presents with  . Medical Management of Chronic Issues    6 mo f/u    HPI: Patient is a 85 y.o. female seen today for evaluation of chronic medical conditions.   The patient has Hx of hypothyroidism, on Levothyroxine 58mcg qd, last TSH 3.43 04/13/18. She takes Vit B12 574mcg qd, Hgb 12.8 04/13/18. LDL 126 04/13/18, she takes Fish Oil, diet modification, not on Lipitor per her choice. Urinary frequency, 2-3x/night, choose not taking Myrbetriq. Non constipation, on Colace 1d. Right leg above the knee pain when getting in/out bed/car, sometime with walking. She denied lower back, hip, pelvis/inner thigh pain, negative straight leg raise test, duration is about 1-22months, no change of severity, not disabling. She did admit falls x2 prior to the leg pain.   Past Medical History:  Diagnosis Date  . Abnormality of gait   . Cerebral atrophy ([place]) 01/21/13  . Chest pain, unspecified   . Contusion of face, scalp, and neck except eye(s)   . Contusion of lower leg   . Corns and callosities   . Edema   . Family history of malignant neoplasm of breast   . Lumbago   . Osteoarthrosis, unspecified whether generalized or localized, unspecified site   . Osteoporosis, unspecified   . Other and unspecified hyperlipidemia   . Other conditions of brain(348.89)   . Other  generalized ischemic cerebrovascular disease 01/21/13   small vessel disease  . Pain in joint, lower leg   . Personal history of fall   . Syncope and collapse   . Umbilical hernia without mention of obstruction or gangrene   . Unspecified constipation   . Unspecified hearing loss   . Unspecified hypothyroidism   . Unspecified vitamin D deficiency   . Urinary frequency   . Urinary tract infection, site not specified   . Ventral hernia, unspecified, without mention of obstruction or gangrene     Past Surgical History:  Procedure Laterality Date  . CATARACT EXTRACTION W/ INTRAOCULAR LENS  IMPLANT, BILATERAL  2012   Dr. Bing Plume  . HERNIA REPAIR  01/2008   laparoscopic Dr. [name]  . KNEE SURGERY  2008   Dr. Alyssa Grove  left  . TOE AMPUTATION  1986   little toe right foot  Dr. Dema Severin; [place]    Allergies  Allergen Reactions  . Mushroom Extract Complex   . Penicillins Diarrhea    Allergies as of 04/23/2018      Reactions   Mushroom Extract Complex    Penicillins Diarrhea      Medication List       Accurate as of April 23, 2018 11:59 PM. Always use your most recent med list.        aspirin 81 MG tablet Take 81 mg by mouth daily.   Calcium 600+D 600-200 MG-UNIT Tabs  Generic drug:  Calcium Carbonate-Vitamin D Take 1 tablet by mouth 2 (two) times daily.   cholecalciferol 1000 units tablet Commonly known as:  VITAMIN D Take 1,000 Units by mouth daily.   docusate sodium 100 MG capsule Commonly known as:  Colace Take 1 capsule (100 mg total) by mouth daily.   Fish Oil 1200 MG Caps Take 1 capsule by mouth daily.   levothyroxine 75 MCG tablet Commonly known as:  SYNTHROID, LEVOTHROID Take one tablet by mouth once daily 30 minutes before breakfast for thyroid   multivitamin with minerals Tabs tablet Take 1 tablet by mouth daily.   vitamin B-12 500 MCG tablet Commonly known as:  CYANOCOBALAMIN Take 500 mcg by mouth daily.       Review of Systems:  Review  of Systems  Constitutional: Positive for unexpected weight change. Negative for activity change, appetite change, chills, diaphoresis, fatigue and fever.       #4Ibs in the past 6 months.   HENT: Positive for hearing loss. Negative for congestion and voice change.   Respiratory: Negative for cough, shortness of breath and wheezing.   Cardiovascular: Negative for chest pain, palpitations and leg swelling.  Gastrointestinal: Negative for abdominal distention, abdominal pain, constipation, diarrhea, nausea and vomiting.  Genitourinary: Positive for frequency. Negative for difficulty urinating, dysuria and urgency.       2-3x/night.   Musculoskeletal: Positive for arthralgias and gait problem.  Skin: Negative for color change and pallor.  Neurological: Negative for dizziness, facial asymmetry, speech difficulty, weakness, numbness and headaches.  Psychiatric/Behavioral: Negative for agitation, behavioral problems, hallucinations and sleep disturbance. The patient is not nervous/anxious.     Health Maintenance  Topic Date Due  . TETANUS/TDAP  06/13/2027  . INFLUENZA VACCINE  Completed  . DEXA SCAN  Completed  . PNA vac Low Risk Adult  Completed    Physical Exam: Vitals:   04/23/18 1341  BP: 128/62  Pulse: 72  Resp: 16  Temp: 98.8 F (37.1 C)  SpO2: 95%  Weight: 122 lb 12.8 oz (55.7 kg)  Height: 5\' 1"  (1.549 m)   Body mass index is 23.2 kg/m. Physical Exam Constitutional:      General: She is not in acute distress.    Appearance: Normal appearance. She is normal weight. She is not ill-appearing, toxic-appearing or diaphoretic.  HENT:     Head: Normocephalic and atraumatic.     Nose: Nose normal.     Mouth/Throat:     Mouth: Mucous membranes are moist.  Eyes:     Extraocular Movements: Extraocular movements intact.     Conjunctiva/sclera: Conjunctivae normal.     Pupils: Pupils are equal, round, and reactive to light.  Neck:     Musculoskeletal: Normal range of motion and  neck supple.  Cardiovascular:     Rate and Rhythm: Normal rate and regular rhythm.  Pulmonary:     Effort: Pulmonary effort is normal.     Breath sounds: Normal breath sounds. No wheezing, rhonchi or rales.  Abdominal:     General: There is no distension.     Palpations: Abdomen is soft.     Tenderness: There is no abdominal tenderness. There is no guarding or rebound.  Musculoskeletal:     Right lower leg: No edema.     Left lower leg: No edema.     Comments: Unsteady gait.   Skin:    General: Skin is warm and dry.     Comments: Knee scars from previous arthroscopic  Neurological:     General: No focal deficit present.     Mental Status: She is alert and oriented to person, place, and time. Mental status is at baseline.     Motor: No weakness.     Coordination: Coordination normal.     Gait: Gait abnormal.  Psychiatric:        Mood and Affect: Mood normal.        Behavior: Behavior normal.        Thought Content: Thought content normal.        Judgment: Judgment normal.     Labs reviewed: Basic Metabolic Panel: Recent Labs    07/07/17 0700 10/05/17 0000 12/22/17 0720 04/13/18 0725  NA 139 140 143 141  K 4.3 4.5 4.1 4.2  CL 108 106 106 106  CO2 26 27 29 27   GLUCOSE 109* 101* 97 98  BUN 19 18 20  27*  CREATININE 1.09* 1.02* 1.01* 1.01*  CALCIUM 9.3 9.8 9.4 9.4  TSH 2.88  --  3.93 3.43   Liver Function Tests: Recent Labs    10/05/17 0000 12/22/17 0720 04/13/18 0725  AST 17 15 14   ALT 13 15 10   BILITOT 0.4 0.4 0.5  PROT 6.7 6.4 6.5   No results for input(s): LIPASE, AMYLASE in the last 8760 hours. No results for input(s): AMMONIA in the last 8760 hours. CBC: Recent Labs    05/26/17 0720 12/22/17 0720 04/13/18 0725  WBC 5.3 5.8 5.4  NEUTROABS  --   --  2,705  HGB 12.4 12.1 12.8  HCT 37.3 37.0 38.0  MCV 87.4 89.4 90.3  PLT 321 301 322   Lipid Panel: Recent Labs    07/07/17 0700 12/22/17 0720 04/13/18 0725  CHOL 209* 135 208*  HDL 59 57 62   LDLCALC 124* 58 126*  TRIG 146 121 101  CHOLHDL 3.5 2.4 3.4   Lab Results  Component Value Date   HGBA1C 5.8 (H) 12/22/2017    Procedures since last visit: No results found.  Assessment/Plan Hypothyroidism Continue Levothyroxine 45mcg qd, last TSH 3.43 04/13/18.   OAB (overactive bladder) Persists, nocturnal urination about 2-3 x/night, no difficulty returning to sleep.   Abnormality of gait Unsteady sometimes.   B12 deficiency Stable, Hgb 12.8 04/13/18, continue VitB12 552mcg qd.   Bilateral leg edema Not apparent  Constipation Stable, continue Colace qd.   Hyperlipidemia LDL is not at goal, 126 04/13/18, the patient desires to continue Fish oil, diet, exercise, opt out Lipitor.   Personal history of fall 2x several months ago, will refer to PT for eval and treatment.   Prediabetes Continue life style modification.   Right leg pain Pain above the knee when she getting in/out bed/car. Will have PT to eval and treat as indicated. May consider Ortho if no better.   Weight loss #4Ibs in the past 6 months, the patient denied GI or mood symptoms, TSH wnl 04/13/18. She stated she is on diet, exercise life style modification since she was informed her prediabetes condition. Will continue to observe.     Labs/tests ordered: none  Next appt:  prn

## 2018-05-04 DIAGNOSIS — M79604 Pain in right leg: Secondary | ICD-10-CM | POA: Diagnosis not present

## 2018-05-04 DIAGNOSIS — Z89421 Acquired absence of other right toe(s): Secondary | ICD-10-CM | POA: Diagnosis not present

## 2018-05-04 DIAGNOSIS — M549 Dorsalgia, unspecified: Secondary | ICD-10-CM | POA: Diagnosis not present

## 2018-05-04 DIAGNOSIS — K5900 Constipation, unspecified: Secondary | ICD-10-CM | POA: Diagnosis not present

## 2018-05-04 DIAGNOSIS — E559 Vitamin D deficiency, unspecified: Secondary | ICD-10-CM | POA: Diagnosis not present

## 2018-05-04 DIAGNOSIS — R609 Edema, unspecified: Secondary | ICD-10-CM | POA: Diagnosis not present

## 2018-05-04 DIAGNOSIS — R2681 Unsteadiness on feet: Secondary | ICD-10-CM | POA: Diagnosis not present

## 2018-05-04 DIAGNOSIS — M810 Age-related osteoporosis without current pathological fracture: Secondary | ICD-10-CM | POA: Diagnosis not present

## 2018-05-04 DIAGNOSIS — M6281 Muscle weakness (generalized): Secondary | ICD-10-CM | POA: Diagnosis not present

## 2018-05-04 DIAGNOSIS — R011 Cardiac murmur, unspecified: Secondary | ICD-10-CM | POA: Diagnosis not present

## 2018-05-04 DIAGNOSIS — M2550 Pain in unspecified joint: Secondary | ICD-10-CM | POA: Diagnosis not present

## 2018-05-06 DIAGNOSIS — M79604 Pain in right leg: Secondary | ICD-10-CM | POA: Diagnosis not present

## 2018-05-06 DIAGNOSIS — R2681 Unsteadiness on feet: Secondary | ICD-10-CM | POA: Diagnosis not present

## 2018-05-06 DIAGNOSIS — M549 Dorsalgia, unspecified: Secondary | ICD-10-CM | POA: Diagnosis not present

## 2018-05-06 DIAGNOSIS — M6281 Muscle weakness (generalized): Secondary | ICD-10-CM | POA: Diagnosis not present

## 2018-05-06 DIAGNOSIS — E559 Vitamin D deficiency, unspecified: Secondary | ICD-10-CM | POA: Diagnosis not present

## 2018-05-06 DIAGNOSIS — M2550 Pain in unspecified joint: Secondary | ICD-10-CM | POA: Diagnosis not present

## 2018-05-07 DIAGNOSIS — M6281 Muscle weakness (generalized): Secondary | ICD-10-CM | POA: Diagnosis not present

## 2018-05-07 DIAGNOSIS — M79604 Pain in right leg: Secondary | ICD-10-CM | POA: Diagnosis not present

## 2018-05-07 DIAGNOSIS — M2550 Pain in unspecified joint: Secondary | ICD-10-CM | POA: Diagnosis not present

## 2018-05-07 DIAGNOSIS — E559 Vitamin D deficiency, unspecified: Secondary | ICD-10-CM | POA: Diagnosis not present

## 2018-05-07 DIAGNOSIS — M549 Dorsalgia, unspecified: Secondary | ICD-10-CM | POA: Diagnosis not present

## 2018-05-07 DIAGNOSIS — R2681 Unsteadiness on feet: Secondary | ICD-10-CM | POA: Diagnosis not present

## 2018-05-10 DIAGNOSIS — M79604 Pain in right leg: Secondary | ICD-10-CM | POA: Diagnosis not present

## 2018-05-10 DIAGNOSIS — R2681 Unsteadiness on feet: Secondary | ICD-10-CM | POA: Diagnosis not present

## 2018-05-10 DIAGNOSIS — M6281 Muscle weakness (generalized): Secondary | ICD-10-CM | POA: Diagnosis not present

## 2018-05-10 DIAGNOSIS — M2550 Pain in unspecified joint: Secondary | ICD-10-CM | POA: Diagnosis not present

## 2018-05-10 DIAGNOSIS — E559 Vitamin D deficiency, unspecified: Secondary | ICD-10-CM | POA: Diagnosis not present

## 2018-05-10 DIAGNOSIS — M549 Dorsalgia, unspecified: Secondary | ICD-10-CM | POA: Diagnosis not present

## 2018-05-12 DIAGNOSIS — M2550 Pain in unspecified joint: Secondary | ICD-10-CM | POA: Diagnosis not present

## 2018-05-12 DIAGNOSIS — R2681 Unsteadiness on feet: Secondary | ICD-10-CM | POA: Diagnosis not present

## 2018-05-12 DIAGNOSIS — M6281 Muscle weakness (generalized): Secondary | ICD-10-CM | POA: Diagnosis not present

## 2018-05-12 DIAGNOSIS — M79604 Pain in right leg: Secondary | ICD-10-CM | POA: Diagnosis not present

## 2018-05-12 DIAGNOSIS — E559 Vitamin D deficiency, unspecified: Secondary | ICD-10-CM | POA: Diagnosis not present

## 2018-05-12 DIAGNOSIS — M549 Dorsalgia, unspecified: Secondary | ICD-10-CM | POA: Diagnosis not present

## 2018-05-14 DIAGNOSIS — M2550 Pain in unspecified joint: Secondary | ICD-10-CM | POA: Diagnosis not present

## 2018-05-14 DIAGNOSIS — M6281 Muscle weakness (generalized): Secondary | ICD-10-CM | POA: Diagnosis not present

## 2018-05-14 DIAGNOSIS — E559 Vitamin D deficiency, unspecified: Secondary | ICD-10-CM | POA: Diagnosis not present

## 2018-05-14 DIAGNOSIS — M79604 Pain in right leg: Secondary | ICD-10-CM | POA: Diagnosis not present

## 2018-05-14 DIAGNOSIS — M549 Dorsalgia, unspecified: Secondary | ICD-10-CM | POA: Diagnosis not present

## 2018-05-14 DIAGNOSIS — R2681 Unsteadiness on feet: Secondary | ICD-10-CM | POA: Diagnosis not present

## 2018-05-17 DIAGNOSIS — E559 Vitamin D deficiency, unspecified: Secondary | ICD-10-CM | POA: Diagnosis not present

## 2018-05-17 DIAGNOSIS — M6281 Muscle weakness (generalized): Secondary | ICD-10-CM | POA: Diagnosis not present

## 2018-05-17 DIAGNOSIS — M2550 Pain in unspecified joint: Secondary | ICD-10-CM | POA: Diagnosis not present

## 2018-05-17 DIAGNOSIS — R2681 Unsteadiness on feet: Secondary | ICD-10-CM | POA: Diagnosis not present

## 2018-05-17 DIAGNOSIS — M79604 Pain in right leg: Secondary | ICD-10-CM | POA: Diagnosis not present

## 2018-05-17 DIAGNOSIS — M549 Dorsalgia, unspecified: Secondary | ICD-10-CM | POA: Diagnosis not present

## 2018-05-19 DIAGNOSIS — M2550 Pain in unspecified joint: Secondary | ICD-10-CM | POA: Diagnosis not present

## 2018-05-19 DIAGNOSIS — E559 Vitamin D deficiency, unspecified: Secondary | ICD-10-CM | POA: Diagnosis not present

## 2018-05-19 DIAGNOSIS — M6281 Muscle weakness (generalized): Secondary | ICD-10-CM | POA: Diagnosis not present

## 2018-05-19 DIAGNOSIS — R2681 Unsteadiness on feet: Secondary | ICD-10-CM | POA: Diagnosis not present

## 2018-05-19 DIAGNOSIS — M549 Dorsalgia, unspecified: Secondary | ICD-10-CM | POA: Diagnosis not present

## 2018-05-19 DIAGNOSIS — M79604 Pain in right leg: Secondary | ICD-10-CM | POA: Diagnosis not present

## 2018-05-21 ENCOUNTER — Encounter: Payer: Self-pay | Admitting: Internal Medicine

## 2018-05-21 ENCOUNTER — Non-Acute Institutional Stay: Payer: Medicare Other | Admitting: Internal Medicine

## 2018-05-21 ENCOUNTER — Other Ambulatory Visit: Payer: Self-pay

## 2018-05-21 ENCOUNTER — Encounter: Payer: Medicare Other | Admitting: Internal Medicine

## 2018-05-21 VITALS — BP 118/70 | HR 74 | Temp 98.1°F | Ht 61.0 in | Wt 124.0 lb

## 2018-05-21 DIAGNOSIS — N183 Chronic kidney disease, stage 3 unspecified: Secondary | ICD-10-CM

## 2018-05-21 DIAGNOSIS — M2550 Pain in unspecified joint: Secondary | ICD-10-CM | POA: Diagnosis not present

## 2018-05-21 DIAGNOSIS — E782 Mixed hyperlipidemia: Secondary | ICD-10-CM

## 2018-05-21 DIAGNOSIS — M8588 Other specified disorders of bone density and structure, other site: Secondary | ICD-10-CM | POA: Diagnosis not present

## 2018-05-21 DIAGNOSIS — R7303 Prediabetes: Secondary | ICD-10-CM | POA: Diagnosis not present

## 2018-05-21 DIAGNOSIS — M6281 Muscle weakness (generalized): Secondary | ICD-10-CM | POA: Diagnosis not present

## 2018-05-21 DIAGNOSIS — E039 Hypothyroidism, unspecified: Secondary | ICD-10-CM

## 2018-05-21 DIAGNOSIS — R2681 Unsteadiness on feet: Secondary | ICD-10-CM | POA: Diagnosis not present

## 2018-05-21 DIAGNOSIS — E538 Deficiency of other specified B group vitamins: Secondary | ICD-10-CM | POA: Diagnosis not present

## 2018-05-21 DIAGNOSIS — E559 Vitamin D deficiency, unspecified: Secondary | ICD-10-CM | POA: Diagnosis not present

## 2018-05-21 DIAGNOSIS — R011 Cardiac murmur, unspecified: Secondary | ICD-10-CM | POA: Diagnosis not present

## 2018-05-21 DIAGNOSIS — M549 Dorsalgia, unspecified: Secondary | ICD-10-CM | POA: Diagnosis not present

## 2018-05-21 DIAGNOSIS — M79604 Pain in right leg: Secondary | ICD-10-CM | POA: Diagnosis not present

## 2018-05-21 MED ORDER — LEVOTHYROXINE SODIUM 75 MCG PO TABS: ORAL_TABLET | ORAL | 3 refills | Status: DC

## 2018-05-21 NOTE — Progress Notes (Signed)
Location: [place] of Service:  Clinic (12)  Provider:   Code Status:  Goals of Care:  Advanced Directives 06/02/2017  Does Patient Have a Medical Advance Directive? Yes  Type of Advance Directive Living will;Healthcare Power of [place];Out of facility DNR (pink MOST or yellow form)  Does patient want to make changes to medical advance directive? No - Patient declined  Copy of [place] in Chart? No - copy requested  Pre-existing out of facility DNR order (yellow form or pink MOST form) Pink MOST form placed in chart (order not valid for inpatient use)     Chief Complaint  Patient presents with  . Medical Management of Chronic Issues    discuss lab results, routine visit    HPI: Patient is a 85 y.o. female seen today for an Routine visit to establish Care And is here to discuss her labs and establish care with me. Patient has a history of hypothyroidism, B12 deficiency, hyperlipidemia, CKd stage III, osteopenia  Patient lives in [facility].  She is not drining any more.  She has not had any falls recently.   She was taking statin but her daughter told her not to take it so she stopped.   She did not have any complaints of cough or chest pain or fever or shortness of breath. She is works at [employer].  Is trying to exercise in her  room due to [place]  Past Medical History:  Diagnosis Date  . Abnormality of gait   . Cerebral atrophy ([place]) 01/21/13  . Chest pain, unspecified   . Contusion of face, scalp, and neck except eye(s)   . Contusion of lower leg   . Corns and callosities   . Edema   . Family history of malignant neoplasm of breast   . Lumbago   . Osteoarthrosis, unspecified whether generalized or localized, unspecified site   . Osteoporosis, unspecified   . Other and unspecified hyperlipidemia   . Other conditions of brain(348.89)   . Other generalized ischemic cerebrovascular disease 01/21/13   small  vessel disease  . Pain in joint, lower leg   . Personal history of fall   . Syncope and collapse   . Umbilical hernia without mention of obstruction or gangrene   . Unspecified constipation   . Unspecified hearing loss   . Unspecified hypothyroidism   . Unspecified vitamin D deficiency   . Urinary frequency   . Urinary tract infection, site not specified   . Ventral hernia, unspecified, without mention of obstruction or gangrene     Past Surgical History:  Procedure Laterality Date  . CATARACT EXTRACTION W/ INTRAOCULAR LENS  IMPLANT, BILATERAL  2012   Dr. Bing Plume  . HERNIA REPAIR  01/2008   laparoscopic Dr. [name]  . KNEE SURGERY  2008   Dr. Alyssa Grove  left  . TOE AMPUTATION  1986   little toe right foot  Dr. Dema Severin; [place]    Allergies  Allergen Reactions  . Mushroom Extract Complex   . Penicillins Diarrhea    Outpatient Encounter Medications as of 05/21/2018  Medication Sig  . aspirin 81 MG tablet Take 81 mg by mouth daily.  . Calcium Carbonate-Vitamin D (CALCIUM 600+D) 600-200 MG-UNIT TABS Take 1 tablet by mouth 2 (two) times daily.  . cholecalciferol (VITAMIN D) 1000 UNITS tablet Take 1,000 Units by mouth daily.  Marland Kitchen docusate sodium (COLACE) 100 MG capsule Take 1 capsule (  100 mg total) by mouth daily.  Marland Kitchen levothyroxine (SYNTHROID) 75 MCG tablet Take one tablet by mouth once daily 30 minutes before breakfast for thyroid  . Multiple Vitamin (MULTIVITAMIN WITH MINERALS) TABS Take 1 tablet by mouth daily.  . Omega-3 Fatty Acids (FISH OIL) 1200 MG CAPS Take 1 capsule by mouth daily.  . vitamin B-12 (CYANOCOBALAMIN) 500 MCG tablet Take 500 mcg by mouth daily.  . [DISCONTINUED] levothyroxine (SYNTHROID, LEVOTHROID) 75 MCG tablet Take one tablet by mouth once daily 30 minutes before breakfast for thyroid   No facility-administered encounter medications on file as of 05/21/2018.     Review of Systems:  Review of Systems  Review of Systems  Constitutional: Negative for  activity change, appetite change, chills, diaphoresis, fatigue and fever.  HENT: Negative for mouth sores, postnasal drip, rhinorrhea, sinus pain and sore throat.   Respiratory: Negative for apnea, cough, chest tightness, shortness of breath and wheezing.   Cardiovascular: Negative for chest pain, palpitations and leg swelling.  Gastrointestinal: Negative for abdominal distention, abdominal pain, constipation, diarrhea, nausea and vomiting.  Genitourinary: Negative for dysuria and frequency.  Musculoskeletal: Negative for arthralgias, joint swelling and myalgias.  Skin: Negative for rash.  Neurological: Negative for dizziness, syncope, weakness, light-headedness and numbness.  Psychiatric/Behavioral: Negative for behavioral problems, confusion and sleep disturbance.     Health Maintenance  Topic Date Due  . INFLUENZA VACCINE  08/28/2018  . TETANUS/TDAP  06/13/2027  . DEXA SCAN  Completed  . PNA vac Low Risk Adult  Completed    Physical Exam: Vitals:   05/21/18 0954  BP: 118/70  Pulse: 74  Temp: 98.1 F (36.7 C)  TempSrc: Oral  SpO2: 97%  Weight: 124 lb (56.2 kg)  Height: 5\' 1"  (1.549 m)   Body mass index is 23.43 kg/m. Physical Exam  Constitutional: Oriented to person, place, and time. Well-developed and well-nourished.  HENT:  Head: Normocephalic.  Mouth/Throat: Oropharynx is clear and moist.  Eyes: Pupils are equal, round, and reactive to light.  Neck: Neck supple.  Cardiovascular: Normal rate and normal heart sounds. Mumur Was present No murmur heard. Pulmonary/Chest: Effort normal and breath sounds normal. No respiratory distress. No wheezes. She has no rales.  Abdominal: Soft. Bowel sounds are normal. No distension. There is no tenderness. There is no rebound.  Musculoskeletal: No edema.  Lymphadenopathy: none Neurological: Alert and oriented to person, place, and time.  Skin: Skin is warm and dry.  Psychiatric: Normal mood and affect. Behavior is normal.  Thought content normal.    Labs reviewed: Basic Metabolic Panel: Recent Labs    07/07/17 0700 10/05/17 0000 12/22/17 0720 04/13/18 0725  NA 139 140 143 141  K 4.3 4.5 4.1 4.2  CL 108 106 106 106  CO2 26 27 29 27   GLUCOSE 109* 101* 97 98  BUN 19 18 20  27*  CREATININE 1.09* 1.02* 1.01* 1.01*  CALCIUM 9.3 9.8 9.4 9.4  TSH 2.88  --  3.93 3.43   Liver Function Tests: Recent Labs    10/05/17 0000 12/22/17 0720 04/13/18 0725  AST 17 15 14   ALT 13 15 10   BILITOT 0.4 0.4 0.5  PROT 6.7 6.4 6.5   No results for input(s): LIPASE, AMYLASE in the last 8760 hours. No results for input(s): AMMONIA in the last 8760 hours. CBC: Recent Labs    05/26/17 0720 12/22/17 0720 04/13/18 0725  WBC 5.3 5.8 5.4  NEUTROABS  --   --  2,705  HGB 12.4 12.1 12.8  HCT  37.3 37.0 38.0  MCV 87.4 89.4 90.3  PLT 321 301 322   Lipid Panel: Recent Labs    07/07/17 0700 12/22/17 0720 04/13/18 0725  CHOL 209* 135 208*  HDL 59 57 62  LDLCALC 124* 58 126*  TRIG 146 121 101  CHOLHDL 3.5 2.4 3.4   Lab Results  Component Value Date   HGBA1C 5.8 (H) 12/22/2017    Procedures since last visit: No results found.  Assessment/Plan 1. Hypothyroidism, unspecified type TSH was normal Continue same dose - levothyroxine (SYNTHROID) 75 MCG tablet; Take one tablet by mouth once daily 30 minutes before breakfast for thyroid  Dispense: 90 tablet; Refill: 3  CKD (chronic kidney disease) stage 3, GFR 30-59 ml/min (HCC) Creat stable  Osteopenia T score -2.2 in 11/2016 On calcium and vit d  Prediabetes A1C 5.8 in 11/19  Mixed hyperlipidemia  LDL is now elevated she stopped  her statin. I have offered her other options  she is going to talk to her daughter before considering it Right now control with her diet and exercise for now. B12 deficiency B12 level was normal in 2019  Cardiac murmur Murmur on my exam Patient never  had echo.  She is asymptomatic at this time We will continue to follow     Labs/tests ordered:  CBC, CMP,TSH,LIPID ,A1C Next appt:  09/21/2018 Total time spent in this patient care encounter was  40_  minutes; greater than 50% of the visit spent counseling patient and staff, reviewing records , Labs and coordinating care for problems addressed at this encounter.

## 2018-05-24 DIAGNOSIS — M549 Dorsalgia, unspecified: Secondary | ICD-10-CM | POA: Diagnosis not present

## 2018-05-24 DIAGNOSIS — M6281 Muscle weakness (generalized): Secondary | ICD-10-CM | POA: Diagnosis not present

## 2018-05-24 DIAGNOSIS — R2681 Unsteadiness on feet: Secondary | ICD-10-CM | POA: Diagnosis not present

## 2018-05-24 DIAGNOSIS — M79604 Pain in right leg: Secondary | ICD-10-CM | POA: Diagnosis not present

## 2018-05-24 DIAGNOSIS — E559 Vitamin D deficiency, unspecified: Secondary | ICD-10-CM | POA: Diagnosis not present

## 2018-05-24 DIAGNOSIS — M2550 Pain in unspecified joint: Secondary | ICD-10-CM | POA: Diagnosis not present

## 2018-05-26 DIAGNOSIS — M2550 Pain in unspecified joint: Secondary | ICD-10-CM | POA: Diagnosis not present

## 2018-05-26 DIAGNOSIS — R2681 Unsteadiness on feet: Secondary | ICD-10-CM | POA: Diagnosis not present

## 2018-05-26 DIAGNOSIS — M549 Dorsalgia, unspecified: Secondary | ICD-10-CM | POA: Diagnosis not present

## 2018-05-26 DIAGNOSIS — M6281 Muscle weakness (generalized): Secondary | ICD-10-CM | POA: Diagnosis not present

## 2018-05-26 DIAGNOSIS — E559 Vitamin D deficiency, unspecified: Secondary | ICD-10-CM | POA: Diagnosis not present

## 2018-05-26 DIAGNOSIS — M79604 Pain in right leg: Secondary | ICD-10-CM | POA: Diagnosis not present

## 2018-05-28 DIAGNOSIS — M79604 Pain in right leg: Secondary | ICD-10-CM | POA: Diagnosis not present

## 2018-05-28 DIAGNOSIS — M6281 Muscle weakness (generalized): Secondary | ICD-10-CM | POA: Diagnosis not present

## 2018-05-28 DIAGNOSIS — R2681 Unsteadiness on feet: Secondary | ICD-10-CM | POA: Diagnosis not present

## 2018-05-28 DIAGNOSIS — M549 Dorsalgia, unspecified: Secondary | ICD-10-CM | POA: Diagnosis not present

## 2018-05-28 DIAGNOSIS — R011 Cardiac murmur, unspecified: Secondary | ICD-10-CM | POA: Diagnosis not present

## 2018-05-28 DIAGNOSIS — M2550 Pain in unspecified joint: Secondary | ICD-10-CM | POA: Diagnosis not present

## 2018-05-28 DIAGNOSIS — M810 Age-related osteoporosis without current pathological fracture: Secondary | ICD-10-CM | POA: Diagnosis not present

## 2018-05-28 DIAGNOSIS — K5900 Constipation, unspecified: Secondary | ICD-10-CM | POA: Diagnosis not present

## 2018-05-28 DIAGNOSIS — R609 Edema, unspecified: Secondary | ICD-10-CM | POA: Diagnosis not present

## 2018-05-28 DIAGNOSIS — E559 Vitamin D deficiency, unspecified: Secondary | ICD-10-CM | POA: Diagnosis not present

## 2018-05-28 DIAGNOSIS — M1990 Unspecified osteoarthritis, unspecified site: Secondary | ICD-10-CM | POA: Diagnosis not present

## 2018-05-28 DIAGNOSIS — Z89421 Acquired absence of other right toe(s): Secondary | ICD-10-CM | POA: Diagnosis not present

## 2018-05-31 DIAGNOSIS — M549 Dorsalgia, unspecified: Secondary | ICD-10-CM | POA: Diagnosis not present

## 2018-05-31 DIAGNOSIS — M2550 Pain in unspecified joint: Secondary | ICD-10-CM | POA: Diagnosis not present

## 2018-05-31 DIAGNOSIS — M6281 Muscle weakness (generalized): Secondary | ICD-10-CM | POA: Diagnosis not present

## 2018-05-31 DIAGNOSIS — R2681 Unsteadiness on feet: Secondary | ICD-10-CM | POA: Diagnosis not present

## 2018-05-31 DIAGNOSIS — M1990 Unspecified osteoarthritis, unspecified site: Secondary | ICD-10-CM | POA: Diagnosis not present

## 2018-05-31 DIAGNOSIS — M79604 Pain in right leg: Secondary | ICD-10-CM | POA: Diagnosis not present

## 2018-06-04 DIAGNOSIS — M6281 Muscle weakness (generalized): Secondary | ICD-10-CM | POA: Diagnosis not present

## 2018-06-04 DIAGNOSIS — M1990 Unspecified osteoarthritis, unspecified site: Secondary | ICD-10-CM | POA: Diagnosis not present

## 2018-06-04 DIAGNOSIS — M79604 Pain in right leg: Secondary | ICD-10-CM | POA: Diagnosis not present

## 2018-06-04 DIAGNOSIS — M2550 Pain in unspecified joint: Secondary | ICD-10-CM | POA: Diagnosis not present

## 2018-06-04 DIAGNOSIS — M549 Dorsalgia, unspecified: Secondary | ICD-10-CM | POA: Diagnosis not present

## 2018-06-04 DIAGNOSIS — R2681 Unsteadiness on feet: Secondary | ICD-10-CM | POA: Diagnosis not present

## 2018-08-16 DIAGNOSIS — H35373 Puckering of macula, bilateral: Secondary | ICD-10-CM | POA: Diagnosis not present

## 2018-08-16 DIAGNOSIS — Z961 Presence of intraocular lens: Secondary | ICD-10-CM | POA: Diagnosis not present

## 2018-08-16 DIAGNOSIS — H04123 Dry eye syndrome of bilateral lacrimal glands: Secondary | ICD-10-CM | POA: Diagnosis not present

## 2018-08-16 DIAGNOSIS — H11042 Peripheral pterygium, stationary, left eye: Secondary | ICD-10-CM | POA: Diagnosis not present

## 2018-09-21 ENCOUNTER — Other Ambulatory Visit: Payer: Self-pay

## 2018-09-27 ENCOUNTER — Other Ambulatory Visit: Payer: Self-pay | Admitting: Internal Medicine

## 2018-09-27 ENCOUNTER — Other Ambulatory Visit: Payer: Self-pay

## 2018-09-27 DIAGNOSIS — E039 Hypothyroidism, unspecified: Secondary | ICD-10-CM

## 2018-09-27 DIAGNOSIS — E782 Mixed hyperlipidemia: Secondary | ICD-10-CM

## 2018-09-27 DIAGNOSIS — R7303 Prediabetes: Secondary | ICD-10-CM

## 2018-09-27 DIAGNOSIS — E538 Deficiency of other specified B group vitamins: Secondary | ICD-10-CM

## 2018-09-27 DIAGNOSIS — N183 Chronic kidney disease, stage 3 unspecified: Secondary | ICD-10-CM

## 2018-09-28 ENCOUNTER — Other Ambulatory Visit: Payer: Medicare Other

## 2018-09-28 ENCOUNTER — Other Ambulatory Visit: Payer: Self-pay

## 2018-09-28 DIAGNOSIS — N183 Chronic kidney disease, stage 3 (moderate): Secondary | ICD-10-CM | POA: Diagnosis not present

## 2018-09-28 DIAGNOSIS — E039 Hypothyroidism, unspecified: Secondary | ICD-10-CM | POA: Diagnosis not present

## 2018-09-28 DIAGNOSIS — R7303 Prediabetes: Secondary | ICD-10-CM | POA: Diagnosis not present

## 2018-09-28 DIAGNOSIS — E782 Mixed hyperlipidemia: Secondary | ICD-10-CM | POA: Diagnosis not present

## 2018-09-28 DIAGNOSIS — E538 Deficiency of other specified B group vitamins: Secondary | ICD-10-CM | POA: Diagnosis not present

## 2018-09-29 LAB — CBC WITH DIFFERENTIAL/PLATELET
Absolute Monocytes: 291 cells/uL (ref 200–950)
Basophils Absolute: 22 cells/uL (ref 0–200)
Basophils Relative: 0.4 %
Eosinophils Absolute: 230 cells/uL (ref 15–500)
Eosinophils Relative: 4.1 %
HCT: 36.4 % (ref 35.0–45.0)
Hemoglobin: 12.1 g/dL (ref 11.7–15.5)
Lymphs Abs: 1898 cells/uL (ref 850–3900)
MCH: 30 pg (ref 27.0–33.0)
MCHC: 33.2 g/dL (ref 32.0–36.0)
MCV: 90.3 fL (ref 80.0–100.0)
MPV: 9.9 fL (ref 7.5–12.5)
Monocytes Relative: 5.2 %
Neutro Abs: 3158 cells/uL (ref 1500–7800)
Neutrophils Relative %: 56.4 %
Platelets: 336 10*3/uL (ref 140–400)
RBC: 4.03 10*6/uL (ref 3.80–5.10)
RDW: 12.7 % (ref 11.0–15.0)
Total Lymphocyte: 33.9 %
WBC: 5.6 10*3/uL (ref 3.8–10.8)

## 2018-09-29 LAB — COMPLETE METABOLIC PANEL WITH GFR
AG Ratio: 1.4 (calc) (ref 1.0–2.5)
ALT: 12 U/L (ref 6–29)
AST: 16 U/L (ref 10–35)
Albumin: 3.8 g/dL (ref 3.6–5.1)
Alkaline phosphatase (APISO): 48 U/L (ref 37–153)
BUN/Creatinine Ratio: 21 (calc) (ref 6–22)
BUN: 21 mg/dL (ref 7–25)
CO2: 27 mmol/L (ref 20–32)
Calcium: 9.2 mg/dL (ref 8.6–10.4)
Chloride: 109 mmol/L (ref 98–110)
Creat: 1.01 mg/dL — ABNORMAL HIGH (ref 0.60–0.88)
GFR, Est African American: 59 mL/min/{1.73_m2} — ABNORMAL LOW (ref >=60)
GFR, Est Non African American: 51 mL/min/{1.73_m2} — ABNORMAL LOW (ref >=60)
Globulin: 2.8 g/dL (calc) (ref 1.9–3.7)
Glucose, Bld: 95 mg/dL (ref 65–99)
Potassium: 4.4 mmol/L (ref 3.5–5.3)
Sodium: 143 mmol/L (ref 135–146)
Total Bilirubin: 0.5 mg/dL (ref 0.2–1.2)
Total Protein: 6.6 g/dL (ref 6.1–8.1)

## 2018-09-29 LAB — LIPID PANEL
Cholesterol: 201 mg/dL — ABNORMAL HIGH (ref <200)
HDL: 59 mg/dL (ref >=50)
LDL Cholesterol (Calc): 118 mg/dL (calc) — ABNORMAL HIGH
Non-HDL Cholesterol (Calc): 142 mg/dL (calc) — ABNORMAL HIGH (ref <130)
Total CHOL/HDL Ratio: 3.4 (calc) (ref <5.0)
Triglycerides: 127 mg/dL (ref <150)

## 2018-09-29 LAB — HEMOGLOBIN A1C
Hgb A1c MFr Bld: 5.6 % of total Hgb (ref <5.7)
Mean Plasma Glucose: 114 (calc)
eAG (mmol/L): 6.3 (calc)

## 2018-09-29 LAB — VITAMIN B12: Vitamin B-12: 1410 pg/mL — ABNORMAL HIGH (ref 200–1100)

## 2018-09-29 LAB — TSH: TSH: 1.45 mIU/L (ref 0.40–4.50)

## 2018-10-01 ENCOUNTER — Non-Acute Institutional Stay: Payer: Medicare Other | Admitting: Internal Medicine

## 2018-10-01 ENCOUNTER — Other Ambulatory Visit: Payer: Self-pay

## 2018-10-01 ENCOUNTER — Encounter: Payer: Self-pay | Admitting: Internal Medicine

## 2018-10-01 VITALS — BP 136/72 | HR 74 | Temp 96.1°F | Resp 18 | Wt 121.0 lb

## 2018-10-01 DIAGNOSIS — N183 Chronic kidney disease, stage 3 unspecified: Secondary | ICD-10-CM

## 2018-10-01 DIAGNOSIS — R011 Cardiac murmur, unspecified: Secondary | ICD-10-CM | POA: Diagnosis not present

## 2018-10-01 DIAGNOSIS — R600 Localized edema: Secondary | ICD-10-CM | POA: Diagnosis not present

## 2018-10-01 DIAGNOSIS — E782 Mixed hyperlipidemia: Secondary | ICD-10-CM

## 2018-10-01 DIAGNOSIS — R7303 Prediabetes: Secondary | ICD-10-CM | POA: Diagnosis not present

## 2018-10-01 DIAGNOSIS — E538 Deficiency of other specified B group vitamins: Secondary | ICD-10-CM

## 2018-10-01 DIAGNOSIS — E039 Hypothyroidism, unspecified: Secondary | ICD-10-CM | POA: Diagnosis not present

## 2018-10-01 MED ORDER — ATORVASTATIN CALCIUM 10 MG PO TABS: 10.0000 mg | ORAL_TABLET | Freq: Every day | ORAL | 3 refills | Status: DC

## 2018-10-01 NOTE — Progress Notes (Signed)
Location:  [place] of Service:  Clinic (12)  Provider:   Code Status:  Goals of Care:  Advanced Directives 06/02/2017  Does Patient Have a Medical Advance Directive? Yes  Type of Advance Directive Living will;Healthcare Power of [place];Out of facility DNR (pink MOST or yellow form)  Does patient want to make changes to medical advance directive? No - Patient declined  Copy of [place] in Chart? No - copy requested  Pre-existing out of facility DNR order (yellow form or pink MOST form) Pink MOST form placed in chart (order not valid for inpatient use)     Chief Complaint  Patient presents with  . Medical Management of Chronic Issues    4 mo f/u-    HPI: Patient is a 85 y.o. female seen today for medical management of chronic diseases.   Patient has a history of hypothyroidism, B12 deficiency, hyperlipidemia, CKD stage III, osteopenia Her Active problems Hypothyroidism TSH was normal  B12 deficiency Continues with the supplements Hyperlipidemia She was on atorvastatin but discontinued herself CKD stage III Creatinine has been stable Osteopenia Due for repeat bonedensity scan next year Patient is doing well in IL unit and rectal.  She has lost some weight as she does not like the food. She works at [employer].  And tries to do her exercises   Past Medical History:  Diagnosis Date  . Abnormality of gait   . Cerebral atrophy ([place]) 01/21/13  . Chest pain, unspecified   . Contusion of face, scalp, and neck except eye(s)   . Contusion of lower leg   . Corns and callosities   . Edema   . Family history of malignant neoplasm of breast   . Lumbago   . Osteoarthrosis, unspecified whether generalized or localized, unspecified site   . Osteoporosis, unspecified   . Other and unspecified hyperlipidemia   . Other conditions of brain(348.89)   . Other generalized ischemic cerebrovascular disease 01/21/13   small vessel disease   . Pain in joint, lower leg   . Personal history of fall   . Syncope and collapse   . Umbilical hernia without mention of obstruction or gangrene   . Unspecified constipation   . Unspecified hearing loss   . Unspecified hypothyroidism   . Unspecified vitamin D deficiency   . Urinary frequency   . Urinary tract infection, site not specified   . Ventral hernia, unspecified, without mention of obstruction or gangrene     Past Surgical History:  Procedure Laterality Date  . CATARACT EXTRACTION W/ INTRAOCULAR LENS  IMPLANT, BILATERAL  2012   Dr. Bing Plume  . HERNIA REPAIR  01/2008   laparoscopic Dr. [name]  . KNEE SURGERY  2008   Dr. Alyssa Grove  left  . TOE AMPUTATION  1986   little toe right foot  Dr. Dema Severin; [place]    Allergies  Allergen Reactions  . Mushroom Extract Complex   . Penicillins Diarrhea    Outpatient Encounter Medications as of 10/01/2018  Medication Sig  . aspirin 81 MG tablet Take 81 mg by mouth daily.  . Calcium Carbonate-Vitamin D (CALCIUM 600+D) 600-200 MG-UNIT TABS Take 1 tablet by mouth 2 (two) times daily.  . cholecalciferol (VITAMIN D) 1000 UNITS tablet Take 1,000 Units by mouth daily.  Marland Kitchen docusate sodium (COLACE) 100 MG capsule Take 1 capsule (100 mg total) by mouth daily.  Marland Kitchen levothyroxine (SYNTHROID) 75 MCG tablet Take one tablet by mouth once  daily 30 minutes before breakfast for thyroid  . Multiple Vitamin (MULTIVITAMIN WITH MINERALS) TABS Take 1 tablet by mouth daily.  . Omega-3 Fatty Acids (FISH OIL) 1200 MG CAPS Take 1 capsule by mouth daily.  . vitamin B-12 (CYANOCOBALAMIN) 500 MCG tablet Take 500 mcg by mouth daily.   No facility-administered encounter medications on file as of 10/01/2018.     Review of Systems:  Review of Systems  Review of Systems  Constitutional: Negative for activity change, appetite change, chills, diaphoresis, fatigue and fever.  HENT: Negative for mouth sores, postnasal drip, rhinorrhea, sinus pain and sore throat.    Respiratory: Negative for apnea, cough, chest tightness, shortness of breath and wheezing.   Cardiovascular: Negative for chest pain, palpitations and leg swelling.  Gastrointestinal: Negative for abdominal distention, abdominal pain, constipation, diarrhea, nausea and vomiting.  Genitourinary: Negative for dysuria and frequency.  Musculoskeletal: Negative for arthralgias, joint swelling and myalgias.  Skin: Negative for rash.  Neurological: Negative for dizziness, syncope, weakness, light-headedness and numbness.  Psychiatric/Behavioral: Negative for behavioral problems, confusion and sleep disturbance.     Health Maintenance  Topic Date Due  . INFLUENZA VACCINE  08/28/2018  . TETANUS/TDAP  06/13/2027  . DEXA SCAN  Completed  . PNA vac Low Risk Adult  Completed    Physical Exam: Vitals:   10/01/18 0913  BP: 136/72  Pulse: 74  Resp: 18  Temp: (!) 96.1 F (35.6 C)  SpO2: 96%  Weight: 121 lb (54.9 kg)   Body mass index is 22.86 kg/m. Physical Exam  Constitutional: Oriented to person, place, and time. Well-developed and well-nourished.  HENT:  Head: Normocephalic.  Mouth/Throat: Oropharynx is clear and moist.  Eyes: Pupils are equal, round, and reactive to light.  Neck: Neck supple.  Cardiovascular: Normal rate and Positive for Murmur  Pulmonary/Chest: Effort normal and breath sounds normal. No respiratory distress. No wheezes. She has no rales.  Abdominal: Soft. Bowel sounds are normal. No distension. There is no tenderness. There is no rebound.  Musculoskeletal: No edema.  Lymphadenopathy: none Neurological: Alert and oriented to person, place, and time. Gait is stable with No deficits Skin: Skin is warm and dry.  Psychiatric: Normal mood and affect. Behavior is normal. Thought content normal.     Labs reviewed: Basic Metabolic Panel: Recent Labs    12/22/17 0720 04/13/18 0725 09/28/18 0800  NA 143 141 143  K 4.1 4.2 4.4  CL 106 106 109  CO2 29 27 27    GLUCOSE 97 98 95  BUN 20 27* 21  CREATININE 1.01* 1.01* 1.01*  CALCIUM 9.4 9.4 9.2  TSH 3.93 3.43 1.45   Liver Function Tests: Recent Labs    12/22/17 0720 04/13/18 0725 09/28/18 0800  AST 15 14 16   ALT 15 10 12   BILITOT 0.4 0.5 0.5  PROT 6.4 6.5 6.6   No results for input(s): LIPASE, AMYLASE in the last 8760 hours. No results for input(s): AMMONIA in the last 8760 hours. CBC: Recent Labs    12/22/17 0720 04/13/18 0725 09/28/18 0800  WBC 5.8 5.4 5.6  NEUTROABS  --  2,705 3,158  HGB 12.1 12.8 12.1  HCT 37.0 38.0 36.4  MCV 89.4 90.3 90.3  PLT 301 322 336   Lipid Panel: Recent Labs    12/22/17 0720 04/13/18 0725 09/28/18 0800  CHOL 135 208* 201*  HDL 57 62 59  LDLCALC 58 126* 118*  TRIG 121 101 127  CHOLHDL 2.4 3.4 3.4   Lab Results  Component Value  Date   HGBA1C 5.6 09/28/2018    Procedures since last visit: No results found.  Assessment/Plan Acquired hypothyroidism TSH is normal Continue Same dose  CKD (chronic kidney disease) stage 3, GFR 30-59 ml/min (HCC) Creat was stable Prediabetes A1C was less then 6  Mixed hyperlipidemia LDL is high She has agreed to restart her Lipitor  Cardiac murmur New finding Will order 2D echo  Bilateral leg edema Ted hoses Osteopenia T score -2.2 in 11/2016 On calcium and vit d   B12 deficiency Level normal     Labs/tests ordered:  * No order type specified * Next appt:  Visit date not found

## 2018-10-07 ENCOUNTER — Encounter (HOSPITAL_COMMUNITY): Payer: Self-pay | Admitting: Internal Medicine

## 2018-10-20 ENCOUNTER — Other Ambulatory Visit: Payer: Self-pay

## 2018-10-20 ENCOUNTER — Ambulatory Visit (HOSPITAL_COMMUNITY): Payer: Medicare Other | Attending: Cardiology

## 2018-10-20 DIAGNOSIS — R011 Cardiac murmur, unspecified: Secondary | ICD-10-CM | POA: Insufficient documentation

## 2018-10-21 ENCOUNTER — Other Ambulatory Visit: Payer: Self-pay | Admitting: Internal Medicine

## 2018-10-21 DIAGNOSIS — I350 Nonrheumatic aortic (valve) stenosis: Secondary | ICD-10-CM

## 2018-11-11 NOTE — Progress Notes (Signed)
Cardiology Office Note   Date:  11/12/2018   ID:  Krista Bowman, DOB 17-Dec-1931, MRN [id number]  PCP:  Virgie Dad, MD  Cardiologist:   No primary care provider on file. Referring:  Virgie Dad, MD  Chief Complaint  Patient presents with  . Aortic Stenosis      History of Present Illness: Krista Bowman is a 85 y.o. female who is referred by Virgie Dad, MD for evaulation of aortic stenosis.  The patient reports that she had no prior cardiac history.  She was noted recently to have a murmur.  She says she never had this before.  She does have some memory problems but she seems fairly clear today on certain subjects.  She [facility] at [facility].  She says she ambulates with a walker to [employer] and at home.  She denies any cardiovascular symptoms. The patient denies any new symptoms such as chest discomfort, neck or arm discomfort. There has been no new shortness of breath, PND or orthopnea. There have been no reported palpitations, presyncope or syncope.  There is mention of syncope on her past medical history but she does not recall any of this.  She had an echo in Sept. this demonstrated a well-preserved ejection fraction.  The aortic valve mean gradient was 37.   Past Medical History:  Diagnosis Date  . Cerebral atrophy (Krista Bowman) 01/21/13  . Corns and callosities   . Hyperlipidemia   . Hypothyroid   . Osteoarthrosis, unspecified whether generalized or localized, unspecified site   . Osteoporosis, unspecified   . Other and unspecified hyperlipidemia   . Other generalized ischemic cerebrovascular disease 01/21/13   small vessel disease  . Syncope and collapse   . Umbilical hernia without mention of obstruction or gangrene   . Unspecified hearing loss   . Unspecified hypothyroidism   . Unspecified vitamin D deficiency   . Ventral hernia, unspecified, without mention of obstruction or gangrene     Past Surgical History:  Procedure Laterality Date  .  CATARACT EXTRACTION W/ INTRAOCULAR LENS  IMPLANT, BILATERAL  2012   Dr. Bing Plume  . HERNIA REPAIR  01/2008   laparoscopic Dr. [name]  . KNEE SURGERY  2008   Dr. Alyssa Grove  left  . TOE AMPUTATION  1986   little toe right foot  Dr. Dema Severin; [place]     Current Outpatient Medications  Medication Sig Dispense Refill  . aspirin 81 MG tablet Take 81 mg by mouth daily.    Marland Kitchen atorvastatin (LIPITOR) 10 MG tablet Take 1 tablet (10 mg total) by mouth daily. 90 tablet 3  . Calcium Carbonate-Vitamin D (CALCIUM 600+D) 600-200 MG-UNIT TABS Take 1 tablet by mouth 2 (two) times daily.    . cholecalciferol (VITAMIN D) 1000 UNITS tablet Take 1,000 Units by mouth daily.    Marland Kitchen docusate sodium (COLACE) 100 MG capsule Take 1 capsule (100 mg total) by mouth daily. 30 capsule 0  . levothyroxine (SYNTHROID) 75 MCG tablet Take one tablet by mouth once daily 30 minutes before breakfast for thyroid 90 tablet 3  . Multiple Vitamin (MULTIVITAMIN WITH MINERALS) TABS Take 1 tablet by mouth daily.    . Omega-3 Fatty Acids (FISH OIL) 1200 MG CAPS Take 1 capsule by mouth daily.    . vitamin B-12 (CYANOCOBALAMIN) 500 MCG tablet Take 500 mcg by mouth daily.     No current facility-administered medications for this visit.     Allergies:   Mushroom extract  complex and Penicillins    Social History:  The patient  reports that she has never smoked. She has never used smokeless tobacco. She reports that she does not drink alcohol or use drugs.   Family History:  The patient's family history is not on file.    ROS:  Please see the history of present illness.   Otherwise, review of systems are positive for none.   All other systems are reviewed and negative.    PHYSICAL EXAM: VS:  BP (!) 142/72 (BP Location: Left Arm)   Pulse 83   Temp (!) 96.9 F (36.1 C)   Ht 5' (1.524 m)   Wt 119 lb (54 kg)   SpO2 94%   BMI 23.24 kg/m  , BMI Body mass index is 23.24 kg/m. GENERAL:  Well appearing HEENT:  Pupils equal round and  reactive, fundi not visualized, oral mucosa unremarkable NECK:  No jugular venous distention, waveform within normal limits, carotid upstroke brisk and symmetric, no bruits, no thyromegaly LYMPHATICS:  No cervical, inguinal adenopathy LUNGS:  Clear to auscultation bilaterally BACK:  No CVA tenderness CHEST:  Unremarkable, lordosis.   HEART:  PMI not displaced or sustained,S1 and S2 within normal limits, no S3, no S4, no clicks, no rubs, 3 of 6 apical mid peaking systolic murmur, no diastolic murmurs ABD:  Flat, positive bowel sounds normal in frequency in pitch, no bruits, no rebound, no guarding, no midline pulsatile mass, no hepatomegaly, no splenomegaly EXT:  2 plus pulses throughout, no edema, no cyanosis no clubbing SKIN:  No rashes no nodules NEURO:  Cranial nerves II through XII grossly intact, motor grossly intact throughout PSYCH:  Cognitively intact, oriented to person place and time    EKG:  EKG is ordered today. The ekg ordered today demonstrates sinus rhythm, rate 83, axis within normal limits, intervals within normal limits, premature ventricular contractions.  No acute ST-T wave changes.   Recent Labs: 09/28/2018: ALT 12; BUN 21; Creat 1.01; Hemoglobin 12.1; Platelets 336; Potassium 4.4; Sodium 143; TSH 1.45    Lipid Panel    Component Value Date/Time   CHOL 201 (H) 09/28/2018 0800   TRIG 127 09/28/2018 0800   HDL 59 09/28/2018 0800   CHOLHDL 3.4 09/28/2018 0800   LDLCALC 118 (H) 09/28/2018 0800      Wt Readings from Last 3 Encounters:  11/12/18 119 lb (54 kg)  10/01/18 121 lb (54.9 kg)  05/21/18 124 lb (56.2 kg)      Other studies Reviewed: Additional studies/ records that were reviewed today include: Echo. Review of the above records demonstrates:  Please see elsewhere in the note.     ASSESSMENT AND PLAN:  Aortic Stenosis: The patient has severe aortic stenosis by gradient but she has no symptoms related to this.  I questioned her about this carefully.   I like to see her back in 6 months to judge progression.  We talked in detail about the symptoms that could occur it would prompt her to talk to me about further management.  Of note I attempted to call her daughter with her permission to discuss this but have an answering machine.  Dyslipidemia: LDL is 118, HDL 59.  I think this is reasonable with the absence of known coronary disease.     Current medicines are reviewed at length with the patient today.  The patient does not have concerns regarding medicines.  The following changes have been made:  no change  Labs/ tests ordered today include:  Orders Placed This Encounter  Procedures  . EKG 12-Lead  . ECHOCARDIOGRAM COMPLETE     Disposition:   FU with me in six months      Signed, Minus Breeding, MD  11/12/2018 5:51 PM    [facility]

## 2018-11-12 ENCOUNTER — Other Ambulatory Visit: Payer: Self-pay

## 2018-11-12 ENCOUNTER — Ambulatory Visit (INDEPENDENT_AMBULATORY_CARE_PROVIDER_SITE_OTHER): Payer: Medicare Other | Admitting: Cardiology

## 2018-11-12 ENCOUNTER — Encounter: Payer: Self-pay | Admitting: Cardiology

## 2018-11-12 VITALS — BP 142/72 | HR 83 | Temp 96.9°F | Ht 60.0 in | Wt 119.0 lb

## 2018-11-12 DIAGNOSIS — I350 Nonrheumatic aortic (valve) stenosis: Secondary | ICD-10-CM

## 2018-11-12 NOTE — Patient Instructions (Signed)
Medication Instructions:  The current medical regimen is effective;  continue present plan and medications as directed. Please refer to the Current Medication list given to you today. If you need a refill on your cardiac medications before your next appointment, please call your pharmacy.  Testing/Procedures: Echocardiogram-IN 6 [facility] - Your physician has requested that you have an echocardiogram. Echocardiography is a painless test that uses sound waves to create images of your heart. It provides your doctor with information about the size and shape of your heart and how well your heart's chambers and valves are working. This procedure takes approximately one hour. There are no restrictions for this procedure. This will be performed at our [facility] location - [address].  Follow-Up: AFTER ECHO. In Person You may see Minus Breeding, MD or one of the following Advanced Practice Providers on your designated Care Team:  Rosaria Ferries, PA-C Jory Sims, DNP, ANP Cadence Kathlen Mody, NP   At [facility], you and your health needs are our priority.  As part of our continuing mission to provide you with exceptional heart care, we have created designated Provider Care Teams.  These Care Teams include your primary Cardiologist (physician) and Advanced Practice Providers (APPs -  Physician Assistants and Nurse Practitioners) who all work together to provide you with the care you need, when you need it.  Thank you for choosing [facility] at [facility]!!

## 2018-11-16 ENCOUNTER — Telehealth: Payer: Self-pay | Admitting: Cardiology

## 2018-11-16 NOTE — Telephone Encounter (Signed)
Follow Up:    Krista Bowman is returning your call from Friday, concerning her mother.

## 2019-03-24 ENCOUNTER — Other Ambulatory Visit: Payer: Self-pay

## 2019-03-24 DIAGNOSIS — N183 Chronic kidney disease, stage 3 unspecified: Secondary | ICD-10-CM

## 2019-03-24 DIAGNOSIS — E039 Hypothyroidism, unspecified: Secondary | ICD-10-CM

## 2019-03-24 DIAGNOSIS — E782 Mixed hyperlipidemia: Secondary | ICD-10-CM

## 2019-03-25 LAB — CBC WITH DIFFERENTIAL/PLATELET
Absolute Monocytes: 399 cells/uL (ref 200–950)
Basophils Absolute: 23 cells/uL (ref 0–200)
Basophils Relative: 0.4 %
Eosinophils Absolute: 302 cells/uL (ref 15–500)
Eosinophils Relative: 5.3 %
HCT: 37.5 % (ref 35.0–45.0)
Hemoglobin: 12.5 g/dL (ref 11.7–15.5)
Lymphs Abs: 2314 cells/uL (ref 850–3900)
MCH: 30.3 pg (ref 27.0–33.0)
MCHC: 33.3 g/dL (ref 32.0–36.0)
MCV: 91 fL (ref 80.0–100.0)
MPV: 10.2 fL (ref 7.5–12.5)
Monocytes Relative: 7 %
Neutro Abs: 2662 cells/uL (ref 1500–7800)
Neutrophils Relative %: 46.7 %
Platelets: 286 10*3/uL (ref 140–400)
RBC: 4.12 10*6/uL (ref 3.80–5.10)
RDW: 12.4 % (ref 11.0–15.0)
Total Lymphocyte: 40.6 %
WBC: 5.7 10*3/uL (ref 3.8–10.8)

## 2019-03-25 LAB — COMPLETE METABOLIC PANEL WITH GFR
AG Ratio: 1.4 (calc) (ref 1.0–2.5)
ALT: 22 U/L (ref 6–29)
AST: 22 U/L (ref 10–35)
Albumin: 3.9 g/dL (ref 3.6–5.1)
Alkaline phosphatase (APISO): 59 U/L (ref 37–153)
BUN/Creatinine Ratio: 18 (calc) (ref 6–22)
BUN: 19 mg/dL (ref 7–25)
CO2: 27 mmol/L (ref 20–32)
Calcium: 9.4 mg/dL (ref 8.6–10.4)
Chloride: 107 mmol/L (ref 98–110)
Creat: 1.06 mg/dL — ABNORMAL HIGH (ref 0.60–0.88)
GFR, Est African American: 55 mL/min/{1.73_m2} — ABNORMAL LOW (ref >=60)
GFR, Est Non African American: 48 mL/min/{1.73_m2} — ABNORMAL LOW (ref >=60)
Globulin: 2.8 g/dL (calc) (ref 1.9–3.7)
Glucose, Bld: 104 mg/dL — ABNORMAL HIGH (ref 65–99)
Potassium: 4.4 mmol/L (ref 3.5–5.3)
Sodium: 143 mmol/L (ref 135–146)
Total Bilirubin: 0.4 mg/dL (ref 0.2–1.2)
Total Protein: 6.7 g/dL (ref 6.1–8.1)

## 2019-03-25 LAB — LIPID PANEL
Cholesterol: 140 mg/dL (ref <200)
HDL: 59 mg/dL (ref >=50)
LDL Cholesterol (Calc): 64 mg/dL (calc)
Non-HDL Cholesterol (Calc): 81 mg/dL (calc) (ref <130)
Total CHOL/HDL Ratio: 2.4 (calc) (ref <5.0)
Triglycerides: 85 mg/dL (ref <150)

## 2019-03-25 LAB — TSH: TSH: 2.33 mIU/L (ref 0.40–4.50)

## 2019-03-30 ENCOUNTER — Ambulatory Visit: Payer: PPO | Admitting: Internal Medicine

## 2019-03-30 ENCOUNTER — Other Ambulatory Visit: Payer: Self-pay

## 2019-04-01 ENCOUNTER — Telehealth: Payer: Self-pay

## 2019-04-01 NOTE — Telephone Encounter (Signed)
Patient is frustrated because she was scheduled for the wrong clinic day. She was rescheduled but was rescheduled to see [facility] on the 11th, but would prefer to see Dr. Lyndel Safe. Next available apnt with Lyndel Safe at [facility] is the 19th. Sending to Dr. Lyndel Safe to see if it possible patient can be seen before 19th.

## 2019-04-05 NOTE — Telephone Encounter (Signed)
Patient will be seen on the 19th.

## 2019-04-07 ENCOUNTER — Encounter: Payer: Self-pay | Admitting: Nurse Practitioner

## 2019-04-12 ENCOUNTER — Ambulatory Visit (HOSPITAL_COMMUNITY): Payer: PPO | Attending: Cardiology

## 2019-04-12 ENCOUNTER — Other Ambulatory Visit: Payer: Self-pay

## 2019-04-12 DIAGNOSIS — I350 Nonrheumatic aortic (valve) stenosis: Secondary | ICD-10-CM

## 2019-04-13 DIAGNOSIS — Z7189 Other specified counseling: Secondary | ICD-10-CM | POA: Insufficient documentation

## 2019-04-13 NOTE — Progress Notes (Signed)
Cardiology Office Note   Date:  04/14/2019   ID:  [name], DOB 03-14-1931, MRN [id number]  PCP:  Virgie Dad, MD  Cardiologist:   No primary care provider on file. Referring:  Virgie Dad, MD  No chief complaint on file.     History of Present Illness: Deshanta Freund is a 86 y.o. female who is referred by Virgie Dad, MD for evaulation of aortic stenosis.  She was noted recently to have a murmur.  She had an echo in Sept. This demonstrated a well-preserved ejection fraction.  The aortic valve mean gradient was 37.  Follow up echo yesterday demonstrated no change in gradient.    Since I last saw her she still claims to have absolutely no symptoms.  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 The patient denies any new symptoms such as chest discomfort, neck or arm discomfort. There has been no new shortness of breath, PND or orthopnea. There have been no reported palpitations, presyncope or syncope.  She fell not long ago and has some pain a little bit in her right upper thigh.  But otherwise has no acute complaints today.   Past Medical History:  Diagnosis Date  . Cerebral atrophy ([place]) 01/21/13  . Corns and callosities   . Hyperlipidemia   . Hypothyroid   . Osteoarthrosis, unspecified whether generalized or localized, unspecified site   . Osteoporosis, unspecified   . Other and unspecified hyperlipidemia   . Other generalized ischemic cerebrovascular disease 01/21/13   small vessel disease  . Syncope and collapse   . Umbilical hernia without mention of obstruction or gangrene   . Unspecified hearing loss   . Unspecified hypothyroidism   . Unspecified vitamin D deficiency   . Ventral hernia, unspecified, without mention of obstruction or gangrene     Past Surgical History:  Procedure Laterality Date  . CATARACT EXTRACTION W/ INTRAOCULAR LENS  IMPLANT, BILATERAL  2012   Dr. Bing Plume  . HERNIA REPAIR  01/2008    laparoscopic Dr. [name]  . KNEE SURGERY  2008   Dr. Alyssa Grove  left  . TOE AMPUTATION  1986   little toe right foot  Dr. Dema Severin; [place]     Current Outpatient Medications  Medication Sig Dispense Refill  . aspirin 81 MG tablet Take 81 mg by mouth daily.    Marland Kitchen atorvastatin (LIPITOR) 10 MG tablet Take 1 tablet (10 mg total) by mouth daily. 90 tablet 3  . Calcium Carbonate-Vitamin D (CALCIUM 600+D) 600-200 MG-UNIT TABS Take 1 tablet by mouth 2 (two) times daily.    . cholecalciferol (VITAMIN D) 1000 UNITS tablet Take 1,000 Units by mouth daily.    Marland Kitchen docusate sodium (COLACE) 100 MG capsule Take 1 capsule (100 mg total) by mouth daily. 30 capsule 0  . levothyroxine (SYNTHROID) 75 MCG tablet Take one tablet by mouth once daily 30 minutes before breakfast for thyroid 90 tablet 3  . Multiple Vitamin (MULTIVITAMIN WITH MINERALS) TABS Take 1 tablet by mouth daily.    . Omega-3 Fatty Acids (FISH OIL) 1200 MG CAPS Take 1 capsule by mouth daily.    . vitamin B-12 (CYANOCOBALAMIN) 500 MCG tablet Take 500 mcg by mouth daily.     No current facility-administered medications for this visit.    Allergies:   Mushroom extract complex and Penicillins    ROS:  Please see the history of present illness.   Otherwise, review  of systems are positive for none.   All other systems are reviewed and negative.    PHYSICAL EXAM: VS:  BP 130/76   Pulse 65   Temp (!) 97.3 F (36.3 C)   Ht 5\' 2"  (1.575 m)   Wt 115 lb (52.2 kg)   SpO2 97%   BMI 21.03 kg/m  , BMI Body mass index is 21.03 kg/m. GENERAL:  Well appearing NECK:  No jugular venous distention, waveform within normal limits, carotid upstroke brisk and symmetric, no bruits, no thyromegaly LUNGS:  Clear to auscultation bilaterally CHEST:  Unremarkable HEART:  PMI not displaced or sustained,S1 and S2 within normal limits, no S3, no S4, no clicks, no rubs, 3 out of 6 apical systolic murmur mid peaking to late peaking and radiating out aortic  outflow tract, no diastolic murmurs ABD:  Flat, positive bowel sounds normal in frequency in pitch, no bruits, no rebound, no guarding, no midline pulsatile mass, no hepatomegaly, no splenomegaly EXT:  2 plus pulses throughout, no edema, no cyanosis no clubbing   EKG:  EKG is  ordered today. The ekg ordered today demonstrates sinus rhythm, rate 65, axis within normal limits, intervals within normal limits, premature ventricular contractions.  No acute ST-T wave changes.   Recent Labs: 03/24/2019: ALT 22; BUN 19; Creat 1.06; Hemoglobin 12.5; Platelets 286; Potassium 4.4; Sodium 143; TSH 2.33    Lipid Panel    Component Value Date/Time   CHOL 140 03/24/2019 0700   TRIG 85 03/24/2019 0700   HDL 59 03/24/2019 0700   CHOLHDL 2.4 03/24/2019 0700   LDLCALC 64 03/24/2019 0700      Wt Readings from Last 3 Encounters:  04/14/19 115 lb (52.2 kg)  11/12/18 119 lb (54 kg)  10/01/18 121 lb (54.9 kg)      Other studies Reviewed: Additional studies/ records that were reviewed today include: echo Review of the above records demonstrates:  Please see elsewhere in the note.     ASSESSMENT AND PLAN:  Aortic Stenosis:   The gradient is stable.   She reports absolutely no symptoms.  No change in therapy.  We will watch her every 6 months asking about symptoms.  At this point however, she does not need an intervention in the absence of any symptoms.    Dyslipidemia: LDL is 64 in February.  No change in therapy.   Covid education: She has had both of her vaccinations.    Current medicines are reviewed at length with the patient today.  The patient does not have concerns regarding medicines.  The following changes have been made:  no change  Labs/ tests ordered today include:   Orders Placed This Encounter  Procedures  . EKG 12-Lead     Disposition:   FU with me in six months      Signed, Minus Breeding, MD  04/14/2019 11:54 AM    [place]

## 2019-04-14 ENCOUNTER — Encounter: Payer: Self-pay | Admitting: Cardiology

## 2019-04-14 ENCOUNTER — Ambulatory Visit: Payer: PPO | Admitting: Cardiology

## 2019-04-14 ENCOUNTER — Other Ambulatory Visit: Payer: Self-pay

## 2019-04-14 VITALS — BP 130/76 | HR 65 | Temp 97.3°F | Ht 62.0 in | Wt 115.0 lb

## 2019-04-14 DIAGNOSIS — Z7189 Other specified counseling: Secondary | ICD-10-CM

## 2019-04-14 DIAGNOSIS — I350 Nonrheumatic aortic (valve) stenosis: Secondary | ICD-10-CM

## 2019-04-14 DIAGNOSIS — E782 Mixed hyperlipidemia: Secondary | ICD-10-CM

## 2019-04-14 NOTE — Patient Instructions (Signed)
Medication Instructions:  No changes *If you need a refill on your cardiac medications before your next appointment, please call your pharmacy*   Lab Work: None ordered If you have labs (blood work) drawn today and your tests are completely normal, you will receive your results only by: Marland Kitchen MyChart Message (if you have MyChart) OR . A paper copy in the mail If you have any lab test that is abnormal or we need to change your treatment, we will call you to review the results.   Testing/Procedures: None ordered   Follow-Up: At [facility] you and your health needs are our priority.  As part of our continuing mission to provide you with exceptional heart care, we have created designated Provider Care Teams.  These Care Teams include your primary Cardiologist (physician) and Advanced Practice Providers (APPs -  Physician Assistants and Nurse Practitioners) who all work together to provide you with the care you need, when you need it.  We recommend signing up for the patient portal called "MyChart".  Sign up information is provided on this After Visit Summary.  MyChart is used to connect with patients for Virtual Visits (Telemedicine).  Patients are able to view lab/test results, encounter notes, upcoming appointments, etc.  Non-urgent messages can be sent to your provider as well.   To learn more about what you can do with MyChart, go to [email or web address].    Your next appointment:   6 month(s)  The format for your next appointment:   In Person  Provider:   You may see Almyra Deforest, PA or one of the following Advanced Practice Providers on your designated Care Team:

## 2019-04-15 ENCOUNTER — Encounter: Payer: Self-pay | Admitting: Internal Medicine

## 2019-04-15 ENCOUNTER — Non-Acute Institutional Stay: Payer: PPO | Admitting: Internal Medicine

## 2019-04-15 VITALS — BP 134/70 | HR 68 | Temp 97.1°F | Ht 62.0 in | Wt 121.2 lb

## 2019-04-15 DIAGNOSIS — E039 Hypothyroidism, unspecified: Secondary | ICD-10-CM | POA: Diagnosis not present

## 2019-04-15 DIAGNOSIS — E782 Mixed hyperlipidemia: Secondary | ICD-10-CM | POA: Diagnosis not present

## 2019-04-15 DIAGNOSIS — M6289 Other specified disorders of muscle: Secondary | ICD-10-CM

## 2019-04-15 DIAGNOSIS — N1831 Chronic kidney disease, stage 3a: Secondary | ICD-10-CM | POA: Diagnosis not present

## 2019-04-15 DIAGNOSIS — M8580 Other specified disorders of bone density and structure, unspecified site: Secondary | ICD-10-CM | POA: Diagnosis not present

## 2019-04-15 DIAGNOSIS — I350 Nonrheumatic aortic (valve) stenosis: Secondary | ICD-10-CM | POA: Insufficient documentation

## 2019-04-15 DIAGNOSIS — R7303 Prediabetes: Secondary | ICD-10-CM | POA: Diagnosis not present

## 2019-04-15 DIAGNOSIS — M79645 Pain in left finger(s): Secondary | ICD-10-CM

## 2019-04-15 DIAGNOSIS — E538 Deficiency of other specified B group vitamins: Secondary | ICD-10-CM

## 2019-04-15 DIAGNOSIS — W19XXXS Unspecified fall, sequela: Secondary | ICD-10-CM | POA: Diagnosis not present

## 2019-04-15 MED ORDER — ATORVASTATIN CALCIUM 10 MG PO TABS: 10.0000 mg | ORAL_TABLET | Freq: Every day | ORAL | 3 refills | Status: DC

## 2019-04-15 NOTE — Progress Notes (Signed)
Location:  [place] of Service:  Clinic (12)  Provider:   Code Status:  Goals of Care:  Advanced Directives 06/02/2017  Does Patient Have a Medical Advance Directive? Yes  Type of Advance Directive Living will;Healthcare Power of [place];Out of facility DNR (pink MOST or yellow form)  Does patient want to make changes to medical advance directive? No - Patient declined  Copy of [place] in Chart? No - copy requested  Pre-existing out of facility DNR order (yellow form or pink MOST form) Pink MOST form placed in chart (order not valid for inpatient use)     Chief Complaint  Patient presents with  . Medical Management of Chronic Issues    Follow up. Left thumb pain.    HPI: Patient is a 86 y.o. female seen today for medical management of chronic diseases.   Patient has a history of hypothyroidism, B12 deficiency, hyperlipidemia,CKD stage III,osteopenia And Aortic Stenosis  Aortic Stenosis Following with Dr Percival Spanish Recent echo showed that the gradient is stable.  He wants to see her every 6 months.  Patient is completely asymptomatic denies any dizziness or shortness of breath Hyperlipidemia Has restarted her Lipitor Left Thumb Pain Patient works in [employer] and she said sometimes while moving books her left thumb hurts.  It has been hurting more recently.  She did have a fall few weeks ago which seemed like a mechanical fall but it does not look like she fell on her thumb.  Does not have any swelling.  She has not tried any medication or brace for the pain.  Denies any numbness.  Very Active otherwise. Has noticed some weakness when she gets up from her chair Has daughter in [place]  Past Medical History:  Diagnosis Date  . Cerebral atrophy ([place]) 01/21/13  . Corns and callosities   . Hyperlipidemia   . Hypothyroid   . Osteoarthrosis, unspecified whether generalized or localized, unspecified site   . Osteoporosis, unspecified    . Other and unspecified hyperlipidemia   . Other generalized ischemic cerebrovascular disease 01/21/13   small vessel disease  . Syncope and collapse   . Umbilical hernia without mention of obstruction or gangrene   . Unspecified hearing loss   . Unspecified hypothyroidism   . Unspecified vitamin D deficiency   . Ventral hernia, unspecified, without mention of obstruction or gangrene     Past Surgical History:  Procedure Laterality Date  . CATARACT EXTRACTION W/ INTRAOCULAR LENS  IMPLANT, BILATERAL  2012   Dr. Bing Plume  . HERNIA REPAIR  01/2008   laparoscopic Dr. [name]  . KNEE SURGERY  2008   Dr. Alyssa Grove  left  . TOE AMPUTATION  1986   little toe right foot  Dr. Dema Severin; [place]    Allergies  Allergen Reactions  . Mushroom Extract Complex   . Penicillins Diarrhea    Outpatient Encounter Medications as of 04/15/2019  Medication Sig  . aspirin 81 MG tablet Take 81 mg by mouth daily.  Marland Kitchen atorvastatin (LIPITOR) 10 MG tablet Take 1 tablet (10 mg total) by mouth daily.  . Calcium Carbonate-Vitamin D (CALCIUM 600+D) 600-200 MG-UNIT TABS Take 1 tablet by mouth 2 (two) times daily.  . cholecalciferol (VITAMIN D) 1000 UNITS tablet Take 1,000 Units by mouth daily.  Marland Kitchen docusate sodium (COLACE) 100 MG capsule Take 1 capsule (100 mg total) by mouth daily.  Marland Kitchen levothyroxine (SYNTHROID) 75 MCG tablet Take one tablet by mouth once daily  30 minutes before breakfast for thyroid  . Multiple Vitamin (MULTIVITAMIN WITH MINERALS) TABS Take 1 tablet by mouth daily.  . Omega-3 Fatty Acids (FISH OIL) 1200 MG CAPS Take 1 capsule by mouth daily.  . vitamin B-12 (CYANOCOBALAMIN) 500 MCG tablet Take 500 mcg by mouth daily.  . [DISCONTINUED] atorvastatin (LIPITOR) 10 MG tablet Take 1 tablet (10 mg total) by mouth daily.   No facility-administered encounter medications on file as of 04/15/2019.    Review of Systems:  Review of Systems  Review of Systems  Constitutional: Negative for activity change,  appetite change, chills, diaphoresis, fatigue and fever.  HENT: Negative for mouth sores, postnasal drip, rhinorrhea, sinus pain and sore throat.   Respiratory: Negative for apnea, cough, chest tightness, shortness of breath and wheezing.   Cardiovascular: Negative for chest pain, palpitations and leg swelling.  Gastrointestinal: Negative for abdominal distention, abdominal pain, constipation, diarrhea, nausea and vomiting.  Genitourinary: Negative for dysuria and frequency.  Musculoskeletal: Negative for arthralgias, joint swelling and myalgias.  Skin: Negative for rash.  Neurological: Negative for dizziness, syncope, weakness, light-headedness and numbness.  Psychiatric/Behavioral: Negative for behavioral problems, confusion and sleep disturbance.     Health Maintenance  Topic Date Due  . TETANUS/TDAP  06/13/2027  . INFLUENZA VACCINE  Completed  . DEXA SCAN  Completed  . PNA vac Low Risk Adult  Completed    Physical Exam: Vitals:   04/15/19 1140  BP: 134/70  Pulse: 68  Temp: (!) 97.1 F (36.2 C)  SpO2: 96%  Weight: 121 lb 3.2 oz (55 kg)  Height: 5\' 2"  (1.575 m)   Body mass index is 22.17 kg/m. Physical Exam  Constitutional: Oriented to person, place, and time. Well-developed and well-nourished. Has Kyphosis HENT:  Head: Normocephalic.  Mouth/Throat: Oropharynx is clear and moist.  Eyes: Pupils are equal, round, and reactive to light.  Neck: Neck supple.  Cardiovascular: Normal rate and normal heart sounds. Murmur Present  Pulmonary/Chest: Effort normal and breath sounds normal. No respiratory distress. No wheezes. She has no rales.  Abdominal: Soft. Bowel sounds are normal. No distension. There is no tenderness. There is no rebound.  Musculoskeletal: No edema.  Left Thumb No Pain on Movement No swelling No Redness Lymphadenopathy: none Neurological: Alert and oriented to person, place, and time. Took more time to get up but no focal Deficits  Skin: Skin is warm  and dry.  Psychiatric: Normal mood and affect. Behavior is normal. Thought content normal.    Labs reviewed: Basic Metabolic Panel: Recent Labs    09/28/18 0800 03/24/19 0700  NA 143 143  K 4.4 4.4  CL 109 107  CO2 27 27  GLUCOSE 95 104*  BUN 21 19  CREATININE 1.01* 1.06*  CALCIUM 9.2 9.4  TSH 1.45 2.33   Liver Function Tests: Recent Labs    09/28/18 0800 03/24/19 0700  AST 16 22  ALT 12 22  BILITOT 0.5 0.4  PROT 6.6 6.7   No results for input(s): LIPASE, AMYLASE in the last 8760 hours. No results for input(s): AMMONIA in the last 8760 hours. CBC: Recent Labs    09/28/18 0800 03/24/19 0700  WBC 5.6 5.7  NEUTROABS 3,158 2,662  HGB 12.1 12.5  HCT 36.4 37.5  MCV 90.3 91.0  PLT 336 286   Lipid Panel: Recent Labs    09/28/18 0800 03/24/19 0700  CHOL 201* 140  HDL 59 59  LDLCALC 118* 64  TRIG 127 85  CHOLHDL 3.4 2.4   Lab Results  Component Value Date   HGBA1C 5.6 09/28/2018    Procedures since last visit: ECHOCARDIOGRAM COMPLETE  Result Date: 04/12/2019    ECHOCARDIOGRAM REPORT   Patient Name:   KAZUE BRIA Date of Exam: 04/12/2019 Medical Rec #:  [id number]      Height:       60.0 in Accession #:    [id number]     Weight:       119.0 lb Date of Birth:  1931-02-11      BSA:          1.497 m Patient Age:    20 years       BP:           142/72 mmHg Patient Gender: F              HR:           66 bpm. Exam Location:  [facility] Procedure: 2D Echo, Cardiac Doppler and Color Doppler Indications:    I35 Aortic stenosis  History:        Patient has prior history of Echocardiogram examinations, most                 recent 10/20/2018. Signs/Symptoms:Murmur; Risk                 Factors:Dyslipidemia. CKD stage 3. Edema. Pre-diabetes.  Sonographer:    Jessee Avers, RDCS Referring Phys: [place]  1. Left ventricular ejection fraction, by estimation, is 60 to 65%. The left ventricle has normal function. The left ventricle has no regional wall  motion abnormalities. Left ventricular diastolic parameters are consistent with Grade I diastolic dysfunction (impaired relaxation).  2. Right ventricular systolic function is normal. The right ventricular size is normal. There is moderately elevated pulmonary artery systolic pressure.  3. Left atrial size was mildly dilated.  4. The mitral valve is normal in structure. Mild mitral valve regurgitation. No evidence of mitral stenosis.  5. Tricuspid valve regurgitation is moderate.  6. The aortic valve is tricuspid. Aortic valve regurgitation is mild. Moderate to severe aortic valve stenosis. Aortic valve mean gradient measures 37.0 mmHg. Aortic valve Vmax measures 3.91 m/s.  7. The inferior vena cava is normal in size with greater than 50% respiratory variability, suggesting right atrial pressure of 3 mmHg. Comparison(s): No significant change from prior study. 10/20/18 EF 60-65%. Moderate AS 75mmHg mean, 58mmHg peak. FINDINGS  Left Ventricle: Left ventricular ejection fraction, by estimation, is 60 to 65%. The left ventricle has normal function. The left ventricle has no regional wall motion abnormalities. The left ventricular internal cavity size was normal in size. There is  no left ventricular hypertrophy. Left ventricular diastolic parameters are consistent with Grade I diastolic dysfunction (impaired relaxation). Right Ventricle: The right ventricular size is normal. No increase in right ventricular wall thickness. Right ventricular systolic function is normal. There is moderately elevated pulmonary artery systolic pressure. The tricuspid regurgitant velocity is 3.06 m/s, and with an assumed right atrial pressure of 8 mmHg, the estimated right ventricular systolic pressure is [id number] mmHg. Left Atrium: Left atrial size was mildly dilated. Right Atrium: Right atrial size was normal in size. Pericardium: There is no evidence of pericardial effusion. Mitral Valve: The mitral valve is normal in structure. Normal  mobility of the mitral valve leaflets. Moderate mitral annular calcification. Mild mitral valve regurgitation. No evidence of mitral valve stenosis. Tricuspid Valve: The tricuspid valve is normal in structure. Tricuspid valve regurgitation is moderate . No evidence  of tricuspid stenosis. Aortic Valve: The aortic valve is tricuspid. . There is severe thickening and severe calcifcation of the aortic valve. Aortic valve regurgitation is mild. Aortic regurgitation PHT measures 294 msec. Moderate to severe aortic stenosis is present. There is  severe thickening of the aortic valve. There is severe calcifcation of the aortic valve. Aortic valve mean gradient measures 37.0 mmHg. Aortic valve peak gradient measures 61.2 mmHg. Aortic valve area, by VTI measures 0.58 cm. Pulmonic Valve: The pulmonic valve was normal in structure. Pulmonic valve regurgitation is not visualized. No evidence of pulmonic stenosis. Aorta: The aortic root is normal in size and structure. Venous: The inferior vena cava is normal in size with greater than 50% respiratory variability, suggesting right atrial pressure of 3 mmHg. IAS/Shunts: No atrial level shunt detected by color flow Doppler.  LEFT VENTRICLE PLAX 2D LVIDd:         3.60 cm  Diastology LVIDs:         2.40 cm  LV e' lateral:   4.78 cm/s LV PW:         1.10 cm  LV E/e' lateral: 16.7 LV IVS:        1.00 cm  LV e' medial:    3.21 cm/s LVOT diam:     2.00 cm  LV E/e' medial:  24.8 LV SV:         66 LV SV Index:   44 LVOT Area:     3.14 cm  RIGHT VENTRICLE RV Basal diam:  3.20 cm RV S prime:     13.10 cm/s TAPSE (M-mode): 1.4 cm RVSP:           45.5 mmHg LEFT ATRIUM             Index       RIGHT ATRIUM           Index LA diam:        4.10 cm 2.74 cm/m  RA Pressure: 8.00 mmHg LA Vol (A2C):   31.0 ml 20.71 ml/m RA Area:     10.50 cm LA Vol (A4C):   37.0 ml 24.72 ml/m RA Volume:   20.40 ml  13.63 ml/m LA Biplane Vol: 36.8 ml 24.58 ml/m  AORTIC VALVE AV Area (Vmax):    0.64 cm AV Area  (Vmean):   0.65 cm AV Area (VTI):     0.58 cm AV Vmax:           391.00 cm/s AV Vmean:          241.800 cm/s AV VTI:            1.150 m AV Peak Grad:      61.2 mmHg AV Mean Grad:      37.0 mmHg LVOT Vmax:         79.20 cm/s LVOT Vmean:        49.800 cm/s LVOT VTI:          0.212 m LVOT/AV VTI ratio: 0.18 AI PHT:            294 msec  AORTA Ao Root diam: 3.30 cm Ao Asc diam:  3.40 cm MITRAL VALVE               TRICUSPID VALVE                            TR Peak grad:   37.5 mmHg  TR Vmax:        306.00 cm/s MV E velocity: 79.70 cm/s  Estimated RAP:  8.00 mmHg MV A velocity: 86.50 cm/s  RVSP:           45.5 mmHg MV E/A ratio:  0.92                            SHUNTS                            Systemic VTI:  0.21 m                            Systemic Diam: 2.00 cm Candee Furbish MD Electronically signed by Candee Furbish MD Signature Date/Time: 04/12/2019/1:22:35 PM    Final     Assessment/Plan 1. Osteopenia, unspecified location  - [facility] DEXA SCAN; Future  T score -2.2 in 11/2016   Acquired hypothyroidism TSH normal Stage 3a chronic kidney disease Creat Stable Mixed hyperlipidemia LDL good  On Statin Aortic valve stenosis,  Asymptomatic Follows with Cardiology Fall, sequela No Injuries Will consider Therapy if has issues again Pain of left thumb  Most likely Arthritis Xray  Use Tylenol PRN and try OTC brace while working in [employer] Proximal weakness of extremity Consider Therapy if gets any worse B12 deficiency Level normal  Labs/tests ordered:  * No order type specified * Next appt:  08/16/2019

## 2019-04-19 ENCOUNTER — Telehealth: Payer: Self-pay | Admitting: *Deleted

## 2019-04-19 NOTE — Telephone Encounter (Signed)
Ludger Nutting, Nurse with [facility] called and left message on Clinical intake and stated that patient was seen on 3/19 and Prescribed a Rx but they do not know what she was prescribed. Please Advise.   Tried calling back, LMOM to return call.   04/15/2019 OV NOTE-Dr. Lyndel Safe:  Assessment/Plan 1. Osteopenia, unspecified location  - [facility] DEXA SCAN; Future  T score -2.2 in 11/2016   Acquired hypothyroidism TSH normal Stage 3a chronic kidney disease Creat Stable Mixed hyperlipidemia LDL good  On Statin Aortic valve stenosis,  Asymptomatic Follows with Cardiology Fall, sequela No Injuries Will consider Therapy if has issues again Pain of left thumb  Most likely Arthritis Xray  Use Tylenol PRN and try OTC brace while working in the [employer] Proximal weakness of extremity Consider Therapy if gets any worse B12 deficiency Level normal  Labs/tests ordered:  * No order type specified * Next appt:  08/16/2019

## 2019-04-19 NOTE — Telephone Encounter (Signed)
Spoke with Krista Bowman and went over Dr. Steve Rattler Plan and Assessment. No Concerns

## 2019-04-22 ENCOUNTER — Other Ambulatory Visit: Payer: Self-pay | Admitting: Internal Medicine

## 2019-04-22 DIAGNOSIS — E039 Hypothyroidism, unspecified: Secondary | ICD-10-CM

## 2019-04-29 ENCOUNTER — Ambulatory Visit
Admission: RE | Admit: 2019-04-29 | Discharge: 2019-04-29 | Disposition: A | Discharge status: Home / Self Care | Payer: PPO | Source: Ambulatory Visit | Attending: Internal Medicine | Admitting: Internal Medicine

## 2019-04-29 DIAGNOSIS — M19042 Primary osteoarthritis, left hand: Secondary | ICD-10-CM | POA: Diagnosis not present

## 2019-04-29 DIAGNOSIS — M8580 Other specified disorders of bone density and structure, unspecified site: Secondary | ICD-10-CM

## 2019-05-09 ENCOUNTER — Inpatient Hospital Stay (HOSPITAL_COMMUNITY)
Admission: EM | Admit: 2019-05-09 | Discharge: 2019-05-12 | DRG: 352 | Disposition: A | Discharge status: Skilled Nursing Facility | Payer: PPO | Source: Skilled Nursing Facility | Attending: Internal Medicine | Admitting: Internal Medicine

## 2019-05-09 ENCOUNTER — Other Ambulatory Visit: Payer: Self-pay

## 2019-05-09 ENCOUNTER — Encounter (HOSPITAL_COMMUNITY): Payer: Self-pay | Admitting: Emergency Medicine

## 2019-05-09 DIAGNOSIS — Z03818 Encounter for observation for suspected exposure to other biological agents ruled out: Secondary | ICD-10-CM | POA: Diagnosis not present

## 2019-05-09 DIAGNOSIS — K4130 Unilateral femoral hernia, with obstruction, without gangrene, not specified as recurrent: Principal | ICD-10-CM | POA: Diagnosis present

## 2019-05-09 DIAGNOSIS — Z20822 Contact with and (suspected) exposure to covid-19: Secondary | ICD-10-CM | POA: Diagnosis present

## 2019-05-09 DIAGNOSIS — E538 Deficiency of other specified B group vitamins: Secondary | ICD-10-CM | POA: Diagnosis present

## 2019-05-09 DIAGNOSIS — R0902 Hypoxemia: Secondary | ICD-10-CM | POA: Diagnosis present

## 2019-05-09 DIAGNOSIS — M545 Low back pain: Secondary | ICD-10-CM | POA: Diagnosis present

## 2019-05-09 DIAGNOSIS — I491 Atrial premature depolarization: Secondary | ICD-10-CM | POA: Diagnosis not present

## 2019-05-09 DIAGNOSIS — Z79890 Hormone replacement therapy (postmenopausal): Secondary | ICD-10-CM

## 2019-05-09 DIAGNOSIS — K56609 Unspecified intestinal obstruction, unspecified as to partial versus complete obstruction: Secondary | ICD-10-CM

## 2019-05-09 DIAGNOSIS — N1831 Chronic kidney disease, stage 3a: Secondary | ICD-10-CM | POA: Diagnosis present

## 2019-05-09 DIAGNOSIS — I350 Nonrheumatic aortic (valve) stenosis: Secondary | ICD-10-CM | POA: Diagnosis present

## 2019-05-09 DIAGNOSIS — R1030 Lower abdominal pain, unspecified: Secondary | ICD-10-CM | POA: Diagnosis not present

## 2019-05-09 DIAGNOSIS — Z7982 Long term (current) use of aspirin: Secondary | ICD-10-CM

## 2019-05-09 DIAGNOSIS — R5383 Other fatigue: Secondary | ICD-10-CM | POA: Diagnosis present

## 2019-05-09 DIAGNOSIS — E559 Vitamin D deficiency, unspecified: Secondary | ICD-10-CM | POA: Diagnosis present

## 2019-05-09 DIAGNOSIS — R55 Syncope and collapse: Secondary | ICD-10-CM | POA: Diagnosis not present

## 2019-05-09 DIAGNOSIS — K5669 Other partial intestinal obstruction: Secondary | ICD-10-CM | POA: Diagnosis not present

## 2019-05-09 DIAGNOSIS — R109 Unspecified abdominal pain: Secondary | ICD-10-CM | POA: Diagnosis not present

## 2019-05-09 DIAGNOSIS — E039 Hypothyroidism, unspecified: Secondary | ICD-10-CM | POA: Diagnosis present

## 2019-05-09 DIAGNOSIS — G8929 Other chronic pain: Secondary | ICD-10-CM | POA: Diagnosis present

## 2019-05-09 DIAGNOSIS — E782 Mixed hyperlipidemia: Secondary | ICD-10-CM | POA: Diagnosis present

## 2019-05-09 DIAGNOSIS — N183 Chronic kidney disease, stage 3 unspecified: Secondary | ICD-10-CM | POA: Diagnosis present

## 2019-05-09 DIAGNOSIS — Z880 Allergy status to penicillin: Secondary | ICD-10-CM | POA: Diagnosis not present

## 2019-05-09 DIAGNOSIS — R1111 Vomiting without nausea: Secondary | ICD-10-CM | POA: Diagnosis not present

## 2019-05-09 DIAGNOSIS — R1084 Generalized abdominal pain: Secondary | ICD-10-CM | POA: Diagnosis not present

## 2019-05-09 DIAGNOSIS — Z79899 Other long term (current) drug therapy: Secondary | ICD-10-CM | POA: Diagnosis not present

## 2019-05-09 DIAGNOSIS — M810 Age-related osteoporosis without current pathological fracture: Secondary | ICD-10-CM | POA: Diagnosis present

## 2019-05-09 MED ORDER — ONDANSETRON HCL 4 MG/2ML IJ SOLN
4.0000 mg | Freq: Once | INTRAMUSCULAR | Status: AC
  Administered 2019-05-09: 4 mg via INTRAVENOUS
  Filled 2019-05-09: qty 2

## 2019-05-09 MED ORDER — FENTANYL CITRATE (PF) 100 MCG/2ML IJ SOLN
50.0000 ug | Freq: Once | INTRAMUSCULAR | Status: AC
  Administered 2019-05-09: 50 ug via INTRAVENOUS
  Filled 2019-05-09: qty 2

## 2019-05-09 MED ORDER — SODIUM CHLORIDE 0.9 % IV BOLUS
500.0000 mL | Freq: Once | INTRAVENOUS | Status: AC
  Administered 2019-05-09: 500 mL via INTRAVENOUS

## 2019-05-09 NOTE — ED Triage Notes (Signed)
Per EMS, pt from [facility], began having bilateral lower non-radiating abdominal pain about 7pm tonight.  She reports vomiting once tonight, no changes in bowel patterns.    158/82 74 HR 97% RA CBG 150 97.7 temp

## 2019-05-10 ENCOUNTER — Inpatient Hospital Stay (HOSPITAL_COMMUNITY): Payer: PPO | Admitting: Certified Registered Nurse Anesthetist

## 2019-05-10 ENCOUNTER — Encounter (HOSPITAL_COMMUNITY)
Admission: EM | Disposition: A | Discharge status: Skilled Nursing Facility | Payer: PPO | Source: Skilled Nursing Facility | Attending: Internal Medicine

## 2019-05-10 ENCOUNTER — Emergency Department (HOSPITAL_COMMUNITY): Payer: PPO

## 2019-05-10 ENCOUNTER — Encounter (HOSPITAL_COMMUNITY): Payer: Self-pay | Admitting: Internal Medicine

## 2019-05-10 DIAGNOSIS — Z79899 Other long term (current) drug therapy: Secondary | ICD-10-CM | POA: Diagnosis not present

## 2019-05-10 DIAGNOSIS — K56609 Unspecified intestinal obstruction, unspecified as to partial versus complete obstruction: Secondary | ICD-10-CM

## 2019-05-10 DIAGNOSIS — Z79890 Hormone replacement therapy (postmenopausal): Secondary | ICD-10-CM | POA: Diagnosis not present

## 2019-05-10 DIAGNOSIS — I350 Nonrheumatic aortic (valve) stenosis: Secondary | ICD-10-CM | POA: Diagnosis present

## 2019-05-10 DIAGNOSIS — R5383 Other fatigue: Secondary | ICD-10-CM

## 2019-05-10 DIAGNOSIS — E782 Mixed hyperlipidemia: Secondary | ICD-10-CM

## 2019-05-10 DIAGNOSIS — E039 Hypothyroidism, unspecified: Secondary | ICD-10-CM | POA: Diagnosis present

## 2019-05-10 DIAGNOSIS — R0902 Hypoxemia: Secondary | ICD-10-CM | POA: Diagnosis present

## 2019-05-10 DIAGNOSIS — M545 Low back pain: Secondary | ICD-10-CM | POA: Diagnosis present

## 2019-05-10 DIAGNOSIS — E538 Deficiency of other specified B group vitamins: Secondary | ICD-10-CM | POA: Diagnosis present

## 2019-05-10 DIAGNOSIS — Z20822 Contact with and (suspected) exposure to covid-19: Secondary | ICD-10-CM | POA: Diagnosis present

## 2019-05-10 DIAGNOSIS — N1831 Chronic kidney disease, stage 3a: Secondary | ICD-10-CM

## 2019-05-10 DIAGNOSIS — M810 Age-related osteoporosis without current pathological fracture: Secondary | ICD-10-CM | POA: Diagnosis present

## 2019-05-10 DIAGNOSIS — Z7982 Long term (current) use of aspirin: Secondary | ICD-10-CM | POA: Diagnosis not present

## 2019-05-10 DIAGNOSIS — E559 Vitamin D deficiency, unspecified: Secondary | ICD-10-CM | POA: Diagnosis present

## 2019-05-10 DIAGNOSIS — Z880 Allergy status to penicillin: Secondary | ICD-10-CM | POA: Diagnosis not present

## 2019-05-10 DIAGNOSIS — K4130 Unilateral femoral hernia, with obstruction, without gangrene, not specified as recurrent: Secondary | ICD-10-CM | POA: Diagnosis present

## 2019-05-10 DIAGNOSIS — G8929 Other chronic pain: Secondary | ICD-10-CM | POA: Diagnosis present

## 2019-05-10 HISTORY — PX: INGUINAL HERNIA REPAIR: SHX194

## 2019-05-10 LAB — COMPREHENSIVE METABOLIC PANEL
ALT: 30 U/L (ref 0–44)
AST: 28 U/L (ref 15–41)
Albumin: 3.6 g/dL (ref 3.5–5.0)
Alkaline Phosphatase: 53 U/L (ref 38–126)
Anion gap: 10 (ref 5–15)
BUN: 22 mg/dL (ref 8–23)
CO2: 27 mmol/L (ref 22–32)
Calcium: 9.4 mg/dL (ref 8.9–10.3)
Chloride: 103 mmol/L (ref 98–111)
Creatinine, Ser: 1.2 mg/dL — ABNORMAL HIGH (ref 0.44–1.00)
GFR calc Af Amer: 47 mL/min — ABNORMAL LOW (ref >60)
GFR calc non Af Amer: 41 mL/min — ABNORMAL LOW (ref >60)
Glucose, Bld: 132 mg/dL — ABNORMAL HIGH (ref 70–99)
Potassium: 4.2 mmol/L (ref 3.5–5.1)
Sodium: 140 mmol/L (ref 135–145)
Total Bilirubin: 0.7 mg/dL (ref 0.3–1.2)
Total Protein: 7 g/dL (ref 6.5–8.1)

## 2019-05-10 LAB — CBC WITH DIFFERENTIAL/PLATELET
Abs Immature Granulocytes: 0.03 10*3/uL (ref 0.00–0.07)
Basophils Absolute: 0 10*3/uL (ref 0.0–0.1)
Basophils Relative: 0 %
Eosinophils Absolute: 0.1 10*3/uL (ref 0.0–0.5)
Eosinophils Relative: 1 %
HCT: 36.9 % (ref 36.0–46.0)
Hemoglobin: 12.1 g/dL (ref 12.0–15.0)
Immature Granulocytes: 0 %
Lymphocytes Relative: 10 %
Lymphs Abs: 0.9 10*3/uL (ref 0.7–4.0)
MCH: 30.3 pg (ref 26.0–34.0)
MCHC: 32.8 g/dL (ref 30.0–36.0)
MCV: 92.3 fL (ref 80.0–100.0)
Monocytes Absolute: 0.3 10*3/uL (ref 0.1–1.0)
Monocytes Relative: 3 %
Neutro Abs: 7.9 10*3/uL — ABNORMAL HIGH (ref 1.7–7.7)
Neutrophils Relative %: 86 %
Platelets: 281 10*3/uL (ref 150–400)
RBC: 4 MIL/uL (ref 3.87–5.11)
RDW: 14.5 % (ref 11.5–15.5)
WBC: 9.2 10*3/uL (ref 4.0–10.5)
nRBC: 0 % (ref 0.0–0.2)

## 2019-05-10 LAB — URINALYSIS, ROUTINE W REFLEX MICROSCOPIC
Bacteria, UA: NONE SEEN
Bilirubin Urine: NEGATIVE
Glucose, UA: NEGATIVE mg/dL
Ketones, ur: 5 mg/dL — AB
Leukocytes,Ua: NEGATIVE
Nitrite: NEGATIVE
Protein, ur: NEGATIVE mg/dL
Specific Gravity, Urine: 1.02 (ref 1.005–1.030)
pH: 8 (ref 5.0–8.0)

## 2019-05-10 LAB — POCT I-STAT 7, (LYTES, BLD GAS, ICA,H+H)
Bicarbonate: 29.6 mmol/L — ABNORMAL HIGH (ref 20.0–28.0)
Calcium, Ion: 1.28 mmol/L (ref 1.15–1.40)
HCT: 34 % — ABNORMAL LOW (ref 36.0–46.0)
Hemoglobin: 11.6 g/dL — ABNORMAL LOW (ref 12.0–15.0)
O2 Saturation: 100 %
Potassium: 3.7 mmol/L (ref 3.5–5.1)
Sodium: 139 mmol/L (ref 135–145)
TCO2: 32 mmol/L (ref 22–32)
pCO2 arterial: 75.4 mmHg (ref 32.0–48.0)
pH, Arterial: 7.203 — ABNORMAL LOW (ref 7.350–7.450)
pO2, Arterial: 243 mmHg — ABNORMAL HIGH (ref 83.0–108.0)

## 2019-05-10 LAB — SARS CORONAVIRUS 2 (TAT 6-24 HRS): SARS Coronavirus 2: NEGATIVE

## 2019-05-10 LAB — LIPASE, BLOOD: Lipase: 33 U/L (ref 11–51)

## 2019-05-10 SURGERY — REPAIR, HERNIA, INGUINAL, LAPAROSCOPIC
Anesthesia: General | Site: Abdomen

## 2019-05-10 MED ORDER — LACTATED RINGERS IV SOLN: INTRAVENOUS | Status: DC

## 2019-05-10 MED ORDER — ENOXAPARIN SODIUM 30 MG/0.3ML ~~LOC~~ SOLN
30.0000 mg | Freq: Every day | SUBCUTANEOUS | Status: DC
  Administered 2019-05-11 – 2019-05-12 (×2): 30 mg via SUBCUTANEOUS
  Filled 2019-05-10 (×2): qty 0.3

## 2019-05-10 MED ORDER — SUCCINYLCHOLINE CHLORIDE 200 MG/10ML IV SOSY
PREFILLED_SYRINGE | INTRAVENOUS | Status: DC | PRN
  Administered 2019-05-10: 120 mg via INTRAVENOUS

## 2019-05-10 MED ORDER — ONDANSETRON HCL 4 MG/2ML IJ SOLN: 4.0000 mg | Freq: Four times a day (QID) | INTRAMUSCULAR | Status: DC | PRN

## 2019-05-10 MED ORDER — ALBUMIN HUMAN 5 % IV SOLN: INTRAVENOUS | Status: DC | PRN

## 2019-05-10 MED ORDER — LIDOCAINE HCL URETHRAL/MUCOSAL 2 % EX GEL
1.0000 "application " | Freq: Once | CUTANEOUS | Status: DC
  Filled 2019-05-10 (×2): qty 30

## 2019-05-10 MED ORDER — 0.9 % SODIUM CHLORIDE (POUR BTL) OPTIME
TOPICAL | Status: DC | PRN
  Administered 2019-05-10: 1000 mL

## 2019-05-10 MED ORDER — BUPIVACAINE HCL (PF) 0.25 % IJ SOLN
INTRAMUSCULAR | Status: AC
  Filled 2019-05-10: qty 30

## 2019-05-10 MED ORDER — ONDANSETRON HCL 4 MG/2ML IJ SOLN
4.0000 mg | Freq: Once | INTRAMUSCULAR | Status: AC
  Administered 2019-05-10: 4 mg via INTRAVENOUS
  Filled 2019-05-10: qty 2

## 2019-05-10 MED ORDER — LIDOCAINE 2% (20 MG/ML) 5 ML SYRINGE
INTRAMUSCULAR | Status: AC
  Filled 2019-05-10: qty 5

## 2019-05-10 MED ORDER — METHOCARBAMOL 500 MG PO TABS: 500.0000 mg | ORAL_TABLET | Freq: Three times a day (TID) | ORAL | Status: DC | PRN

## 2019-05-10 MED ORDER — SUCCINYLCHOLINE CHLORIDE 200 MG/10ML IV SOSY
PREFILLED_SYRINGE | INTRAVENOUS | Status: AC
  Filled 2019-05-10: qty 10

## 2019-05-10 MED ORDER — ACETAMINOPHEN 500 MG PO TABS
1000.0000 mg | ORAL_TABLET | Freq: Four times a day (QID) | ORAL | Status: DC
  Administered 2019-05-10 – 2019-05-12 (×7): 1000 mg via ORAL
  Filled 2019-05-10 (×7): qty 2

## 2019-05-10 MED ORDER — ETOMIDATE 2 MG/ML IV SOLN
INTRAVENOUS | Status: AC
  Filled 2019-05-10: qty 10

## 2019-05-10 MED ORDER — MORPHINE SULFATE (PF) 2 MG/ML IV SOLN: 1.0000 mg | INTRAVENOUS | Status: DC | PRN

## 2019-05-10 MED ORDER — CEFAZOLIN SODIUM-DEXTROSE 2-4 GM/100ML-% IV SOLN
2.0000 g | Freq: Once | INTRAVENOUS | Status: AC
  Administered 2019-05-10: 2 g via INTRAVENOUS

## 2019-05-10 MED ORDER — FENTANYL CITRATE (PF) 100 MCG/2ML IJ SOLN
INTRAMUSCULAR | Status: DC | PRN
  Administered 2019-05-10 (×3): 50 ug via INTRAVENOUS

## 2019-05-10 MED ORDER — LIDOCAINE 2% (20 MG/ML) 5 ML SYRINGE
INTRAMUSCULAR | Status: DC | PRN
  Administered 2019-05-10: 50 mg via INTRAVENOUS

## 2019-05-10 MED ORDER — DEXAMETHASONE SODIUM PHOSPHATE 10 MG/ML IJ SOLN
INTRAMUSCULAR | Status: DC | PRN
  Administered 2019-05-10: 5 mg via INTRAVENOUS

## 2019-05-10 MED ORDER — LORAZEPAM 2 MG/ML IJ SOLN
1.0000 mg | Freq: Once | INTRAMUSCULAR | Status: AC
  Administered 2019-05-10: 1 mg via INTRAVENOUS
  Filled 2019-05-10: qty 1

## 2019-05-10 MED ORDER — ONDANSETRON HCL 4 MG PO TABS: 4.0000 mg | ORAL_TABLET | Freq: Four times a day (QID) | ORAL | Status: DC | PRN

## 2019-05-10 MED ORDER — TRAMADOL HCL 50 MG PO TABS
50.0000 mg | ORAL_TABLET | Freq: Four times a day (QID) | ORAL | Status: DC | PRN
  Administered 2019-05-12: 50 mg via ORAL
  Filled 2019-05-10: qty 1

## 2019-05-10 MED ORDER — ROCURONIUM BROMIDE 100 MG/10ML IV SOLN
INTRAVENOUS | Status: DC | PRN
  Administered 2019-05-10: 40 mg via INTRAVENOUS

## 2019-05-10 MED ORDER — FENTANYL CITRATE (PF) 100 MCG/2ML IJ SOLN
25.0000 ug | INTRAMUSCULAR | Status: DC | PRN
  Administered 2019-05-10: 25 ug via INTRAVENOUS

## 2019-05-10 MED ORDER — CEFAZOLIN SODIUM-DEXTROSE 2-4 GM/100ML-% IV SOLN
INTRAVENOUS | Status: AC
  Filled 2019-05-10: qty 100

## 2019-05-10 MED ORDER — POLYETHYLENE GLYCOL 3350 17 G PO PACK: 17.0000 g | PACK | Freq: Every day | ORAL | Status: DC | PRN

## 2019-05-10 MED ORDER — PHENYLEPHRINE 40 MCG/ML (10ML) SYRINGE FOR IV PUSH (FOR BLOOD PRESSURE SUPPORT)
PREFILLED_SYRINGE | INTRAVENOUS | Status: AC
  Filled 2019-05-10: qty 10

## 2019-05-10 MED ORDER — IOHEXOL 300 MG/ML  SOLN
100.0000 mL | Freq: Once | INTRAMUSCULAR | Status: AC | PRN
  Administered 2019-05-10: 100 mL via INTRAVENOUS

## 2019-05-10 MED ORDER — ONDANSETRON HCL 4 MG/2ML IJ SOLN
INTRAMUSCULAR | Status: DC | PRN
  Administered 2019-05-10: 4 mg via INTRAVENOUS

## 2019-05-10 MED ORDER — FENTANYL CITRATE (PF) 250 MCG/5ML IJ SOLN
INTRAMUSCULAR | Status: AC
  Filled 2019-05-10: qty 5

## 2019-05-10 MED ORDER — PHENYLEPHRINE HCL-NACL 10-0.9 MG/250ML-% IV SOLN
INTRAVENOUS | Status: DC | PRN
  Administered 2019-05-10: 25 ug/min via INTRAVENOUS

## 2019-05-10 MED ORDER — ONDANSETRON HCL 4 MG/2ML IJ SOLN
INTRAMUSCULAR | Status: AC
  Filled 2019-05-10: qty 2

## 2019-05-10 MED ORDER — DEXAMETHASONE SODIUM PHOSPHATE 10 MG/ML IJ SOLN
INTRAMUSCULAR | Status: AC
  Filled 2019-05-10: qty 1

## 2019-05-10 MED ORDER — ESMOLOL HCL 100 MG/10ML IV SOLN
INTRAVENOUS | Status: DC | PRN
  Administered 2019-05-10: 20 mg via INTRAVENOUS

## 2019-05-10 MED ORDER — ETOMIDATE 2 MG/ML IV SOLN
INTRAVENOUS | Status: DC | PRN
  Administered 2019-05-10: 14 mg via INTRAVENOUS

## 2019-05-10 MED ORDER — MORPHINE SULFATE (PF) 2 MG/ML IV SOLN: 2.0000 mg | INTRAVENOUS | Status: DC | PRN

## 2019-05-10 MED ORDER — BUPIVACAINE-EPINEPHRINE 0.25% -1:200000 IJ SOLN
INTRAMUSCULAR | Status: DC | PRN
  Administered 2019-05-10: 7 mL

## 2019-05-10 MED ORDER — ENOXAPARIN SODIUM 30 MG/0.3ML ~~LOC~~ SOLN: 30.0000 mg | Freq: Every day | SUBCUTANEOUS | Status: DC

## 2019-05-10 MED ORDER — SUGAMMADEX SODIUM 200 MG/2ML IV SOLN
INTRAVENOUS | Status: DC | PRN
  Administered 2019-05-10: 200 mg via INTRAVENOUS

## 2019-05-10 MED ORDER — LEVOTHYROXINE SODIUM 100 MCG/5ML IV SOLN
37.5000 ug | Freq: Every day | INTRAVENOUS | Status: DC
  Filled 2019-05-10: qty 5

## 2019-05-10 MED ORDER — FENTANYL CITRATE (PF) 100 MCG/2ML IJ SOLN
INTRAMUSCULAR | Status: AC
  Filled 2019-05-10: qty 2

## 2019-05-10 MED ORDER — PHENYLEPHRINE 40 MCG/ML (10ML) SYRINGE FOR IV PUSH (FOR BLOOD PRESSURE SUPPORT)
PREFILLED_SYRINGE | INTRAVENOUS | Status: DC | PRN
  Administered 2019-05-10: 120 ug via INTRAVENOUS

## 2019-05-10 MED ORDER — ACETAMINOPHEN 650 MG RE SUPP: 650.0000 mg | RECTAL | Status: DC | PRN

## 2019-05-10 MED ORDER — ROCURONIUM BROMIDE 10 MG/ML (PF) SYRINGE
PREFILLED_SYRINGE | INTRAVENOUS | Status: AC
  Filled 2019-05-10: qty 10

## 2019-05-10 SURGICAL SUPPLY — 32 items
ADH SKN CLS APL DERMABOND .7 (GAUZE/BANDAGES/DRESSINGS) ×2
APL PRP STRL LF DISP 70% ISPRP (MISCELLANEOUS) ×2
BLADE CLIPPER SURG (BLADE) IMPLANT
CANISTER SUCT 3000ML PPV (MISCELLANEOUS) IMPLANT
CHLORAPREP W/TINT 26 (MISCELLANEOUS) ×3 IMPLANT
COVER SURGICAL LIGHT HANDLE (MISCELLANEOUS) ×3 IMPLANT
COVER WAND RF STERILE (DRAPES) ×3 IMPLANT
DERMABOND ADVANCED (GAUZE/BANDAGES/DRESSINGS) ×1
DERMABOND ADVANCED .7 DNX12 (GAUZE/BANDAGES/DRESSINGS) ×2 IMPLANT
GLOVE BIO SURGEON STRL SZ7.5 (GLOVE) ×3 IMPLANT
GLOVE BIOGEL PI IND STRL 8 (GLOVE) ×2 IMPLANT
GLOVE BIOGEL PI INDICATOR 8 (GLOVE) ×1
GOWN STRL REUS W/ TWL LRG LVL3 (GOWN DISPOSABLE) ×2 IMPLANT
GOWN STRL REUS W/ TWL XL LVL3 (GOWN DISPOSABLE) ×2 IMPLANT
GOWN STRL REUS W/TWL LRG LVL3 (GOWN DISPOSABLE) ×9
GOWN STRL REUS W/TWL XL LVL3 (GOWN DISPOSABLE) ×3
KIT BASIN OR (CUSTOM PROCEDURE TRAY) ×3 IMPLANT
KIT TURNOVER KIT B (KITS) ×3 IMPLANT
MESH 3DMAX 4X6 LT LRG (Mesh General) ×1 IMPLANT
PAD ARMBOARD 7.5X6 YLW CONV (MISCELLANEOUS) ×6 IMPLANT
RELOAD STAPLE 4.0 BLU F/HERNIA (INSTRUMENTS) IMPLANT
RELOAD STAPLE HERNIA 4.0 BLUE (INSTRUMENTS) ×3 IMPLANT
SCISSORS LAP 5X35 DISP (ENDOMECHANICALS) ×1 IMPLANT
SET TUBE SMOKE EVAC HIGH FLOW (TUBING) ×1 IMPLANT
STAPLER HERNIA 12 8.5 360D (INSTRUMENTS) ×1 IMPLANT
SUT MNCRL AB 4-0 PS2 18 (SUTURE) ×3 IMPLANT
TOWEL GREEN STERILE FF (TOWEL DISPOSABLE) ×3 IMPLANT
TRAY FOLEY W/BAG SLVR 14FR (SET/KITS/TRAYS/PACK) ×1 IMPLANT
TRAY LAPAROSCOPIC MC (CUSTOM PROCEDURE TRAY) ×1 IMPLANT
TROCAR OPTICAL SHORT 5MM (TROCAR) ×1 IMPLANT
TROCAR OPTICAL SLV SHORT 5MM (TROCAR) ×1 IMPLANT
TROCAR XCEL 12X100 BLDLESS (ENDOMECHANICALS) ×1 IMPLANT

## 2019-05-10 NOTE — Progress Notes (Signed)
[facility] Progress Note     Subjective: Patient groggy from medication this AM. Planning OR for inguinal hernia repair. Called patient's daughter and discussed this and answered questions.   Review of Systems  Unable to perform ROS: Mental status change     Objective: Vital signs in last 24 hours: Pulse Rate:  [75-90] 85 (04/13 0745) Resp:  [15-31] 29 (04/13 0745) BP: (125-154)/(55-87) 143/63 (04/13 0745) SpO2:  [93 %-97 %] 94 % (04/13 0745) Weight:  [54.4 kg] 54.4 kg (04/12 2303)    Intake/Output from previous day: No intake/output data recorded. Intake/Output this shift: No intake/output data recorded.  PE: General: resting, NAD HEENT: head is normocephalic, atraumatic.  Sclera are noninjected.  PERRL.  Ears and nose without any masses or lesions.  Mouth is pink and moist Heart: regular, rate, and rhythm.  Normal s1,s2. No obvious murmurs, gallops, or rubs noted.  Palpable radial and pedal pulses bilaterally Lungs: CTAB, no wheezes, rhonchi, or rales noted.  Respiratory effort nonlabored Abd: soft, appropriately ttp, mildly distended, +BS, left inguinal hernia MS: all 4 extremities are symmetrical with no cyanosis, clubbing, or edema. Skin: warm and dry with no masses, lesions, or rashes Psych: confused   Lab Results:  Recent Labs    05/09/19 2345  WBC 9.2  HGB 12.1  HCT 36.9  PLT 281   BMET Recent Labs    05/09/19 2345  NA 140  K 4.2  CL 103  CO2 27  GLUCOSE 132*  BUN 22  CREATININE 1.20*  CALCIUM 9.4   PT/INR No results for input(s): LABPROT, INR in the last 72 hours. CMP     Component Value Date/Time   NA 140 05/09/2019 2345   NA 141 10/30/2015 0000   K 4.2 05/09/2019 2345   CL 103 05/09/2019 2345   CO2 27 05/09/2019 2345   GLUCOSE 132 (H) 05/09/2019 2345   BUN 22 05/09/2019 2345   BUN 15 10/30/2015 0000   CREATININE 1.20 (H) 05/09/2019 2345   CREATININE 1.06 (H) 03/24/2019 0700   CALCIUM 9.4 05/09/2019 2345   PROT 7.0  05/09/2019 2345   ALBUMIN 3.6 05/09/2019 2345   AST 28 05/09/2019 2345   ALT 30 05/09/2019 2345   ALKPHOS 53 05/09/2019 2345   BILITOT 0.7 05/09/2019 2345   GFRNONAA 41 (L) 05/09/2019 2345   GFRNONAA 48 (L) 03/24/2019 0700   GFRAA 47 (L) 05/09/2019 2345   GFRAA 55 (L) 03/24/2019 0700   Lipase     Component Value Date/Time   LIPASE 33 05/09/2019 2345       Studies/Results: CT ABDOMEN PELVIS W CONTRAST  Result Date: 05/10/2019 CLINICAL DATA:  Abdominal pain acute. EXAM: CT ABDOMEN AND PELVIS WITH CONTRAST TECHNIQUE: Multidetector CT imaging of the abdomen and pelvis was performed using the standard protocol following bolus administration of intravenous contrast. CONTRAST:  13mL OMNIPAQUE IOHEXOL 300 MG/ML  SOLN COMPARISON:  11/19/2007 FINDINGS: Lower chest: No acute abnormality Hepatobiliary: No focal liver abnormality is seen. No gallstones, gallbladder wall thickening, or biliary dilatation. Pancreas: Unremarkable. No pancreatic ductal dilatation or surrounding inflammatory changes. Spleen: Calcified granulomas identified within the spleen. Adrenals/Urinary Tract: Normal adrenal glands. Bilateral parapelvic cysts identified. The urinary bladder is unremarkable. Stomach/Bowel: Mild distention of the gastric fundus. Increase caliber of the proximal small bowel loops which measure up to 3.1 cm. Dilated small bowel loops inter left inguinal canal which is the transition point for small bowel obstruction. The small bowel loops distal to the left inguinal hernia are  decreased in caliber. No pathologic dilatation the colon the appendix is visualized and appears. Vascular/Lymphatic: Extensive aortic atherosclerosis with branch vessel involvement. No abdominopelvic adenopathy identified. Reproductive: Uterus and bilateral adnexa are unremarkable. Other: Previous ventral abdominal wall herniorrhaphy. Fat containing, midline abdominal wall hernia is noted along the inferior margin of the hernia mesh,  image 60/7. Musculoskeletal: Chronic appearing inferior endplate compression deformity is identified at T10. IMPRESSION: 1. Small bowel obstruction. The transition point for the obstruction at the left inguinal canal which contains a nonobstructed loop of small bowel. 2. Fat containing midline abdominal wall hernia along the inferior margin of the hernia mesh. 3. Chronic appearing inferior endplate compression deformity at T10. Aortic Atherosclerosis (ICD10-I70.0). Electronically Signed   By: Kerby Moors M.D.   On: 05/10/2019 01:50   DG Chest Port 1 View  Result Date: 05/10/2019 CLINICAL DATA:  Vomiting hypoxia EXAM: PORTABLE CHEST 1 VIEW COMPARISON:  12/31/2006 FINDINGS: Stable calcified nodule in the right upper lobe. No focal opacity or pleural effusion. Mild cardiomegaly with aortic atherosclerosis. No pneumothorax. IMPRESSION: No active disease.  Mild cardiomegaly Electronically Signed   By: Donavan Foil M.D.   On: 05/10/2019 02:36    Anti-infectives: Anti-infectives (From admission, onward)   None       Assessment/Plan CKD stage III - Cr 1.2, monitor closely  HLD B12 deficiency Moderate to severe Aortic stenosis - EF 60-65% Chronic low back pain Osteoporosis  Incarcerated left inguinal hernia causing small bowel obstruction  - to OR this AM  FEN: NPO, IVF VTE: SCDs ID: Ancef pre-op  LOS: 0 days    Norm Parcel , [facility] 05/10/2019, 8:16 AM Please see Amion for pager number during day hours 7:00am-4:30pm

## 2019-05-10 NOTE — Anesthesia Procedure Notes (Signed)
Procedure Name: Intubation Date/Time: 05/10/2019 9:50 AM Performed by: Candis Shine, CRNA Pre-anesthesia Checklist: Patient identified, Emergency Drugs available, Suction available and Patient being monitored Patient Re-evaluated:Patient Re-evaluated prior to induction Oxygen Delivery Method: Circle System Utilized Preoxygenation: Pre-oxygenation with 100% oxygen Induction Type: IV induction, Rapid sequence and Cricoid Pressure applied Laryngoscope Size: Mac and 3 Grade View: Grade I Tube type: Oral Tube size: 7.0 mm Number of attempts: 1 Airway Equipment and Method: Stylet Placement Confirmation: ETT inserted through vocal cords under direct vision,  positive ETCO2 and breath sounds checked- equal and bilateral Secured at: 22 cm Tube secured with: Tape Dental Injury: Teeth and Oropharynx as per pre-operative assessment

## 2019-05-10 NOTE — ED Notes (Addendum)
Pt experienced more emesis, provider bedside giving results, zofran ordered and given.  Pt's O2 also dropped to 88%, placed on 2L NS.  Provider notified

## 2019-05-10 NOTE — Consult Note (Signed)
Reason for Consult:SBO Referring Physician: Greeley Bowman is an 86 y.o. female.  HPI: 86 year old female with a history of chronic kidney disease  presented to the emergency department with acute onset of abdominal pain at 7 PM she had one episode of vomiting.  She was evaluated in the emergency department and found to have a small bowel obstruction and I was asked to see her for surgical management.  She is quite confused at this time and unable to contribute with the history.  Past Medical History:  Diagnosis Date  . Cerebral atrophy (Krista Bowman) 01/21/13  . Corns and callosities   . Hyperlipidemia   . Hypothyroid   . Osteoarthrosis, unspecified whether generalized or localized, unspecified site   . Osteoporosis, unspecified   . Other and unspecified hyperlipidemia   . Other generalized ischemic cerebrovascular disease 01/21/13   small vessel disease  . Syncope and collapse   . Umbilical hernia without mention of obstruction or gangrene   . Unspecified hearing loss   . Unspecified hypothyroidism   . Unspecified vitamin D deficiency   . Ventral hernia, unspecified, without mention of obstruction or gangrene     Past Surgical History:  Procedure Laterality Date  . CATARACT EXTRACTION W/ INTRAOCULAR LENS  IMPLANT, BILATERAL  2012   Dr. Bing Plume  . HERNIA REPAIR  01/2008   laparoscopic Dr. [name]  . KNEE SURGERY  2008   Dr. Alyssa Grove  left  . TOE AMPUTATION  1986   little toe right foot  Dr. Dema Severin; [place]    History reviewed. No pertinent family history.  Social History:  reports that she has never smoked. She has never used smokeless tobacco. She reports that she does not drink alcohol or use drugs.  Allergies:  Allergies  Allergen Reactions  . Mushroom Extract Complex Other (See Comments)    "Allergic," per paperwork from facility  . Penicillins Diarrhea and Other (See Comments)    "Allergic," per paperwork from facility    Medications: I have reviewed the  patient's current medications.  Results for orders placed or performed during the hospital encounter of 05/09/19 (from the past 48 hour(s))  CBC with Differential     Status: Abnormal   Collection Time: 05/09/19 11:45 PM  Result Value Ref Range   WBC 9.2 4.0 - 10.5 K/uL   RBC 4.00 3.87 - 5.11 MIL/uL   Hemoglobin 12.1 12.0 - 15.0 g/dL   HCT 36.9 36.0 - 46.0 %   MCV 92.3 80.0 - 100.0 fL   MCH 30.3 26.0 - 34.0 pg   MCHC 32.8 30.0 - 36.0 g/dL   RDW 14.5 11.5 - 15.5 %   Platelets 281 150 - 400 K/uL   nRBC 0.0 0.0 - 0.2 %   Neutrophils Relative % 86 %   Neutro Abs 7.9 (H) 1.7 - 7.7 K/uL   Lymphocytes Relative 10 %   Lymphs Abs 0.9 0.7 - 4.0 K/uL   Monocytes Relative 3 %   Monocytes Absolute 0.3 0.1 - 1.0 K/uL   Eosinophils Relative 1 %   Eosinophils Absolute 0.1 0.0 - 0.5 K/uL   Basophils Relative 0 %   Basophils Absolute 0.0 0.0 - 0.1 K/uL   Immature Granulocytes 0 %   Abs Immature Granulocytes 0.03 0.00 - 0.07 K/uL    Comment: Performed at [facility], [address], [place], [place] [address]  Comprehensive metabolic panel     Status: Abnormal   Collection Time: 05/09/19 11:45 PM  Result  Value Ref Range   Sodium 140 135 - 145 mmol/L   Potassium 4.2 3.5 - 5.1 mmol/L   Chloride 103 98 - 111 mmol/L   CO2 27 22 - 32 mmol/L   Glucose, Bld 132 (H) 70 - 99 mg/dL    Comment: Glucose reference range applies only to samples taken after fasting for at least 8 hours.   BUN 22 8 - 23 mg/dL   Creatinine, Ser 1.20 (H) 0.44 - 1.00 mg/dL   Calcium 9.4 8.9 - 10.3 mg/dL   Total Protein 7.0 6.5 - 8.1 g/dL   Albumin 3.6 3.5 - 5.0 g/dL   AST 28 15 - 41 U/L   ALT 30 0 - 44 U/L   Alkaline Phosphatase 53 38 - 126 U/L   Total Bilirubin 0.7 0.3 - 1.2 mg/dL   GFR calc non Af Amer 41 (L) >60 mL/min   GFR calc Af Amer 47 (L) >60 mL/min   Anion gap 10 5 - 15    Comment: Performed at [facility] [address] [place], [place] [address]  Lipase, blood     Status: None   Collection  Time: 05/09/19 11:45 PM  Result Value Ref Range   Lipase 33 11 - 51 U/L    Comment: Performed at [facility] [address] [place], [place] [address]  Urinalysis, Routine w reflex microscopic     Status: Abnormal   Collection Time: 05/10/19  1:20 AM  Result Value Ref Range   Color, Urine YELLOW YELLOW   APPearance CLOUDY (A) CLEAR   Specific Gravity, Urine 1.020 1.005 - 1.030   pH 8.0 5.0 - 8.0   Glucose, UA NEGATIVE NEGATIVE mg/dL   Hgb urine dipstick SMALL (A) NEGATIVE   Bilirubin Urine NEGATIVE NEGATIVE   Ketones, ur 5 (A) NEGATIVE mg/dL   Protein, ur NEGATIVE NEGATIVE mg/dL   Nitrite NEGATIVE NEGATIVE   Leukocytes,Ua NEGATIVE NEGATIVE   RBC / HPF 11-20 0 - 5 RBC/hpf   WBC, UA 0-5 0 - 5 WBC/hpf   Bacteria, UA NONE SEEN NONE SEEN   Mucus PRESENT     Comment: Performed at [facility] [address], [place], [place] [address]    CT ABDOMEN PELVIS W CONTRAST  Result Date: 05/10/2019 CLINICAL DATA:  Abdominal pain acute. EXAM: CT ABDOMEN AND PELVIS WITH CONTRAST TECHNIQUE: Multidetector CT imaging of the abdomen and pelvis was performed using the standard protocol following bolus administration of intravenous contrast. CONTRAST:  150mL OMNIPAQUE IOHEXOL 300 MG/ML  SOLN COMPARISON:  11/19/2007 FINDINGS: Lower chest: No acute abnormality Hepatobiliary: No focal liver abnormality is seen. No gallstones, gallbladder wall thickening, or biliary dilatation. Pancreas: Unremarkable. No pancreatic ductal dilatation or surrounding inflammatory changes. Spleen: Calcified granulomas identified within the spleen. Adrenals/Urinary Tract: Normal adrenal glands. Bilateral parapelvic cysts identified. The urinary bladder is unremarkable. Stomach/Bowel: Mild distention of the gastric fundus. Increase caliber of the proximal small bowel loops which measure up to 3.1 cm. Dilated small bowel loops inter left inguinal canal which is the transition point for small bowel obstruction. The small  bowel loops distal to the left inguinal hernia are decreased in caliber. No pathologic dilatation the colon the appendix is visualized and appears. Vascular/Lymphatic: Extensive aortic atherosclerosis with branch vessel involvement. No abdominopelvic adenopathy identified. Reproductive: Uterus and bilateral adnexa are unremarkable. Other: Previous ventral abdominal wall herniorrhaphy. Fat containing, midline abdominal wall hernia is noted along the inferior margin of the hernia mesh, image 60/7. Musculoskeletal:  Chronic appearing inferior endplate compression deformity is identified at T10. IMPRESSION: 1. Small bowel obstruction. The transition point for the obstruction at the left inguinal canal which contains a nonobstructed loop of small bowel. 2. Fat containing midline abdominal wall hernia along the inferior margin of the hernia mesh. 3. Chronic appearing inferior endplate compression deformity at T10. Aortic Atherosclerosis (ICD10-I70.0). Electronically Signed   By: Kerby Moors M.D.   On: 05/10/2019 01:50   DG Chest Port 1 View  Result Date: 05/10/2019 CLINICAL DATA:  Vomiting hypoxia EXAM: PORTABLE CHEST 1 VIEW COMPARISON:  12/31/2006 FINDINGS: Stable calcified nodule in the right upper lobe. No focal opacity or pleural effusion. Mild cardiomegaly with aortic atherosclerosis. No pneumothorax. IMPRESSION: No active disease.  Mild cardiomegaly Electronically Signed   By: Donavan Foil M.D.   On: 05/10/2019 02:36    Review of Systems  Unable to perform ROS: Mental status change   Blood pressure 130/74, pulse 83, resp. rate (!) 26, height 5\' 2"  (1.575 m), weight 54.4 kg, SpO2 95 %. Physical Exam  Constitutional: She appears well-developed and well-nourished. No distress.  HENT:  Head: Normocephalic.  Right Ear: External ear normal.  Left Ear: External ear normal.  Mouth/Throat: Oropharynx is clear and moist.  Eyes: Pupils are equal, round, and reactive to light. Right eye exhibits no  discharge. Left eye exhibits discharge. No scleral icterus.  Neck: No tracheal deviation present.  Cardiovascular: Normal rate, regular rhythm, normal heart sounds and intact distal pulses.  Respiratory: Effort normal and breath sounds normal. No respiratory distress. She has no wheezes. She has no rales.  GI: Soft. She exhibits distension. There is abdominal tenderness. There is no rebound and no guarding.  Mild tenderness in the lower abdomen, incarcerated left inguinal hernia  Musculoskeletal:        General: Normal range of motion.     Cervical back: Neck supple.     Comments: Mild edema  Neurological:  Awake and follows commands but is confused and disoriented  Skin: Skin is warm.  Psychiatric:  Mildly anxious, alert but not oriented    Assessment/Plan: Incarcerated left inguinal hernia causing small bowel obstruction - will need repair today.  I will discuss things further with Dr. Rosendo Gros. NPO.  Krista Bowman 05/10/2019, 5:53 AM

## 2019-05-10 NOTE — Progress Notes (Signed)
I agree with plan and note as detailed by the admitting physician. Small bowel obstruction due to a incarcerated hernia, the patient was placed n.p.o. NG tube was placed surgery was consulted and they recommended surgical intervention today. We will need to keep electrolytes stable magnesium greater than 2 potassium greater than 4. Patient is waking up as she was lethargic due to Ativan administered in the ED.  This should be self-limiting no hypoxia.

## 2019-05-10 NOTE — Anesthesia Procedure Notes (Signed)
Arterial Line Insertion Start/End4/13/2021 8:25 AM Performed by: Candis Shine, CRNA, CRNA  Patient location: Pre-op. Preanesthetic checklist: patient identified, IV checked, site marked, risks and benefits discussed, surgical consent, monitors and equipment checked, pre-op evaluation, timeout performed and anesthesia consent Lidocaine 1% used for infiltration Left, radial was placed Catheter size: 20 G Hand hygiene performed  and maximum sterile barriers used   Attempts: 2 Procedure performed without using ultrasound guided technique. Following insertion, dressing applied and Biopatch. Post procedure assessment: normal and unchanged  Patient tolerated the procedure well with no immediate complications.

## 2019-05-10 NOTE — Op Note (Signed)
05/10/2019  10:44 AM  PATIENT:  Krista Bowman  86 y.o. female  PRE-OPERATIVE DIAGNOSIS:  SBO, femoral hernia  POST-OPERATIVE DIAGNOSIS:  Incarcerated Left femoral hernia  PROCEDURE:  Procedure(s): EMERGENT LAPAROSCOPIC LEFT FEMORAL HERNIA REPAIR WITH MESH (N/A)  SURGEON:  Surgeon(s) and Role:    Ralene Ok, MD - Primary  ASSISTANTS: Ewell Poe, RNFA   ANESTHESIA:   local and general  EBL:  5 mL   BLOOD ADMINISTERED:none  DRAINS: none   LOCAL MEDICATIONS USED:  BUPIVICAINE   SPECIMEN:  No Specimen  DISPOSITION OF SPECIMEN:  N/A  COUNTS:  YES  TOURNIQUET:  * No tourniquets in log *  DICTATION: .Dragon Dictation   Counts: reported as correct x 2  Findings:  The patient had a small Left incarcerated femoral hernia.  The bowel was inspected and had no signs of necrosis or bruising.    Indications for procedure:  The patient is a 86 year old female with a left femoral that was acutely incarcerated on CT with small bowel.  Pt was taken back for emergent lap femoral hernia repair with mesh.   Details of the procedure: The patient was taken back to the operating room. The patient was placed in supine position with bilateral SCDs in place.  The patient was prepped and draped in the usual sterile fashion.  After appropriate anitbiotics were confirmed, a time-out was confirmed and all facts were verified.  0.25% Marcaine was used to infiltrate the umbilical area. A 11-blade was used to cut down the skin and blunt dissection was used to get the anterior fashion.  The anterior fascia was incised approximately 1 cm and the muscles were retracted laterally. Blunt dissection was then used to create a space in the preperitoneal area. At this time a 10 mm camera was then introduced into the space and advanced the pubic tubercle and a 12 mm trocar was placed over this and insufflation was started.  At this time and space was created from medial to laterally the preperitoneal  space.  Cooper's ligament was initially cleaned off.  The hernia sac was identified in the femoral space. Dissection of the hernia sac and femoral hernia was undertaken.  A relaxing incision in Cooper's ligament,  superior to the incarcerated hernia.  This allowed me to reduce the hernia.  Once the hernia sac was taken down to approximately the umbilicus a Bard 3D Max mesh, size: Large, was  introduced into the preperitoneal space.  The mesh was brought over to cover the direct, indirect, and femoral hernia spaces.  This was anchored into place and secured to Cooper's ligament with 4.35mm staples from a [place] hernia stapler. It was anchored to the anterior abdominal wall with 4.8 mm staples. The hernia sac was seen lying posterior to the mesh. There was no staples placed laterally. The insufflation was evacuated and the peritoneum was seen posterior to the mesh. The trochars were removed. The anterior fascia was reapproximated using #1 Vicryl on a UR- 6.    A 17mm LUQ trochar was placed after a veress needle technique was used to create insufflation.  A second 80mm port was placed in the left lower quardrant.  I was able to visualize the bowel that was near the femoral hernia.  It was not seen to be bruised or ischemic.  There hernia ans sac could seen to be reduced.  Intra-abdominal air was evacuated and the trocars were removed. The skin was reapproximated using 4-0 Monocryl subcuticular fashion and dressed with Dermbond.  The patient was awakened from general anesthesia and taken to recovery in stable condition.   PLAN OF CARE: Admit for overnight observation  PATIENT DISPOSITION:  PACU - hemodynamically stable.   Delay start of Pharmacological VTE agent (>24hrs) due to surgical blood loss or risk of bleeding: not applicable

## 2019-05-10 NOTE — Social Work (Signed)
Pt from [facility].  Yates Decamp, admissions at SNF, had reached out to this writer to f/u with pt in case she needs SNF. CSW has reached out to OT Caris for update after eval. Pt will need authorization through [employer] if SNF required.   Westley Hummer, MSW, [facility]

## 2019-05-10 NOTE — ED Notes (Signed)
Krista Bowman daughter [phone] looking for an update on the pt

## 2019-05-10 NOTE — Anesthesia Postprocedure Evaluation (Signed)
Anesthesia Post Note  Patient: Krista Bowman  Procedure(s) Performed: LAPAROSCOPIC FEMORAL HERNIA REPAIR WITH MESH (N/A Abdomen)     Patient location during evaluation: PACU Anesthesia Type: General Level of consciousness: awake and alert Pain management: pain level controlled Vital Signs Assessment: post-procedure vital signs reviewed and stable Respiratory status: spontaneous breathing, nonlabored ventilation and respiratory function stable Cardiovascular status: blood pressure returned to baseline and stable Postop Assessment: no apparent nausea or vomiting Anesthetic complications: no    Last Vitals:  Vitals:   05/10/19 1150 05/10/19 1155  BP: (!) 115/48 (!) 106/53  Pulse:  83  Resp:  17  Temp:  36.7 C  SpO2:  95%    Last Pain:  Vitals:   05/10/19 1155  PainSc: 0-No pain                 Lynda Rainwater

## 2019-05-10 NOTE — ED Notes (Signed)
Pt to CT

## 2019-05-10 NOTE — Transfer of Care (Signed)
Immediate Anesthesia Transfer of Care Note  Patient: Luvenia Hearing  Procedure(s) Performed: LAPAROSCOPIC FEMORAL HERNIA REPAIR WITH MESH (N/A Abdomen)  Patient Location: PACU  Anesthesia Type:General  Level of Consciousness: alert   Airway & Oxygen Therapy: Patient Spontanous Breathing and Patient connected to face mask oxygen  Post-op Assessment: Report given to RN and Post -op Vital signs reviewed and stable  Post vital signs: Reviewed and stable  Last Vitals:  Vitals Value Taken Time  BP    Temp    Pulse 94 05/10/19 1108  Resp 19 05/10/19 1108  SpO2 89 % 05/10/19 1108  Vitals shown include unvalidated device data.  Last Pain:  Vitals:   05/10/19 0659  PainSc: Asleep         Complications: No apparent anesthesia complications

## 2019-05-10 NOTE — ED Notes (Signed)
Two RNs attempted to place NG tube, not successful.  ED providers also attempted, again not successful.

## 2019-05-10 NOTE — ED Notes (Signed)
RN called and spoke to daughter updating her to the current situation. She would like for staff to leave a message on her machine concerning the findings.

## 2019-05-10 NOTE — Discharge Instructions (Signed)
[facility] _______[facility] ° °UMBILICAL OR INGUINAL HERNIA REPAIR: POST OP INSTRUCTIONS ° °Always review your discharge instruction sheet given to you by the facility where your surgery was performed. °IF YOU HAVE DISABILITY OR FAMILY LEAVE FORMS, YOU MUST BRING THEM TO THE OFFICE FOR PROCESSING.   °DO NOT GIVE THEM TO YOUR DOCTOR. ° °1. A  prescription for pain medication may be given to you upon discharge.  Take your pain medication as prescribed, if needed.  If narcotic pain medicine is not needed, then you may take acetaminophen (Tylenol) or ibuprofen (Advil) as needed. °2. Take your usually prescribed medications unless otherwise directed. °If you need a refill on your pain medication, please contact your pharmacy.  They will contact our office to request authorization. Prescriptions will not be filled after 5 pm or on week-ends. °3. You should follow a light diet the first 24 hours after arrival home, such as soup and crackers, etc.  Be sure to include lots of fluids daily.  Resume your normal diet the day after surgery. °4.Most patients will experience some swelling and bruising around the umbilicus or in the groin and scrotum.  Ice packs and reclining will help.  Swelling and bruising can take several days to resolve.  °6. It is common to experience some constipation if taking pain medication after surgery.  Increasing fluid intake and taking a stool softener (such as Colace) will usually help or prevent this problem from occurring.  A mild laxative (Milk of Magnesia or Miralax) should be taken according to package directions if there are no bowel movements after 48 hours. °7. Unless discharge instructions indicate otherwise, you may remove your bandages 24-48 hours after surgery, and you may shower at that time.  You may have steri-strips (small skin tapes) in place directly over the incision.  These strips should be left on the skin for 7-10 days.  If your surgeon used skin glue on the  incision, you may shower in 24 hours.  The glue will flake off over the next 2-3 weeks.  Any sutures or staples will be removed at the office during your follow-up visit. °8. ACTIVITIES:  You may resume regular (light) daily activities beginning the next day--such as daily self-care, walking, climbing stairs--gradually increasing activities as tolerated.  You may have sexual intercourse when it is comfortable.  Refrain from any heavy lifting or straining until approved by your doctor. ° °a.You may drive when you are no longer taking prescription pain medication, you can comfortably wear a seatbelt, and you can safely maneuver your car and apply brakes. ° °9.You should see your doctor in the office for a follow-up appointment approximately 2-3 weeks after your surgery.  Make sure that you call for this appointment within a day or two after you arrive home to insure a convenient appointment time. ° ° °WHEN TO CALL YOUR DOCTOR: °1. Fever over 101.0 °2. Inability to urinate °3. Nausea and/or vomiting °4. Extreme swelling or bruising °5. Continued bleeding from incision. °6. Increased pain, redness, or drainage from the incision ° °The clinic staff is available to answer your questions during regular business hours.  Please don’t hesitate to call and ask to speak to one of the nurses for clinical concerns.  If you have a medical emergency, go to the nearest emergency room or call 911.  A surgeon from [facility] is always on call at the hospital ° ° °[address], [address], [place], [place]  [address] ? °   [address] [address], [place], [place]   [address] °[phone] ? [phone] ? FAX [phone] °Web site: [email or web address] °

## 2019-05-10 NOTE — ED Provider Notes (Signed)
[facility] EMERGENCY DEPARTMENT Provider Note   CSN: [id number] Arrival date & time: 05/09/19  2255     History Chief Complaint  Patient presents with   Abdominal Pain    Melaine Macintosh is a 86 y.o. female.  The history is provided by the patient and medical records.  Abdominal Pain Associated symptoms: nausea and vomiting     86 y.o. F with hx of HLP, hypothyroidism, hernia, vitamin D deficiency, presenting to the ED with lower abdominal pain.  This began around 7 PM.  She reports one episode of emesis and still feels somewhat nauseated.  She has not had any diarrhea or constipation symptoms.  No fever or chills.  No urinary symptoms.  States she has never had pain like this before.  Prior abdominal surgeries include hernia repair.  No meds given via EMS.  Past Medical History:  Diagnosis Date   Cerebral atrophy ([place]) 01/21/13   Corns and callosities    Hyperlipidemia    Hypothyroid    Osteoarthrosis, unspecified whether generalized or localized, unspecified site    Osteoporosis, unspecified    Other and unspecified hyperlipidemia    Other generalized ischemic cerebrovascular disease 01/21/13   small vessel disease   Syncope and collapse    Umbilical hernia without mention of obstruction or gangrene    Unspecified hearing loss    Unspecified hypothyroidism    Unspecified vitamin D deficiency    Ventral hernia, unspecified, without mention of obstruction or gangrene     Patient Active Problem List   Diagnosis Date Noted   Aortic valve stenosis 04/15/2019   Educated about COVID-19 virus infection 04/13/2019   Cardiac murmur 05/21/2018   Weight loss 04/23/2018   OAB (overactive bladder) 09/29/2017   B12 deficiency 09/29/2017   Osteopenia 02/03/2017   Prediabetes 10/28/2016   Stage 3a chronic kidney disease 10/28/2016   History of osteoporosis 10/28/2016   Bilateral leg edema 10/28/2016   Leg weakness 05/08/2016   Low  back pain 05/24/2015   Constipation 01/13/2013   Right leg pain 12/14/2012   Abnormality of gait 01/29/2012   Edema 10/18/2009   Personal history of fall 06/05/2009   Mixed hyperlipidemia 04/01/2007   Hypothyroidism 12/11/2006    Past Surgical History:  Procedure Laterality Date   CATARACT EXTRACTION W/ INTRAOCULAR LENS  IMPLANT, BILATERAL  2012   Dr. Bing Plume   HERNIA REPAIR  01/2008   laparoscopic Dr. [name]   KNEE SURGERY  2008   Dr. Alyssa Grove  left   TOE AMPUTATION  1986   little toe right foot  Dr. Dema Severin; [place]     OB History   No obstetric history on file.     History reviewed. No pertinent family history.  Social History   Tobacco Use   Smoking status: Never Smoker   Smokeless tobacco: Never Used  Substance Use Topics   Alcohol use: No   Drug use: No    Home Medications Prior to Admission medications   Medication Sig Start Date End Date Taking? Authorizing Provider  aspirin 81 MG tablet Take 81 mg by mouth daily.    [provider]  atorvastatin (LIPITOR) 10 MG tablet Take 1 tablet (10 mg total) by mouth daily. 04/15/19   Virgie Dad, MD  Calcium Carbonate-Vitamin D (CALCIUM 600+D) 600-200 MG-UNIT TABS Take 1 tablet by mouth 2 (two) times daily.    [provider]  cholecalciferol (VITAMIN D) 1000 UNITS tablet Take 1,000 Units by mouth daily.  [provider]  docusate sodium (COLACE) 100 MG capsule Take 1 capsule (100 mg total) by mouth daily. 06/02/17   Blanchie Serve, MD  levothyroxine (SYNTHROID) 75 MCG tablet TAKE 1 TABLET DAILY 30 MINUTES BEFORE BREAKFAST FOR THYROID 04/22/19   Virgie Dad, MD  Multiple Vitamin (MULTIVITAMIN WITH MINERALS) TABS Take 1 tablet by mouth daily.    [provider]  Omega-3 Fatty Acids (FISH OIL) 1200 MG CAPS Take 1 capsule by mouth daily.    [provider]  vitamin B-12 (CYANOCOBALAMIN) 500 MCG tablet Take 500 mcg by mouth daily.    [provider]    Allergies    Mushroom extract complex and Penicillins  Review of Systems   Review of Systems  Gastrointestinal: Positive for abdominal pain, nausea and vomiting.  All other systems reviewed and are negative.   Physical Exam Updated Vital Signs BP (!) 139/55 (BP Location: Left Arm)    Pulse 75    Resp 16    Ht 5\' 2"  (1.575 m)    Wt 54.4 kg    SpO2 96%    BMI 21.95 kg/m   Physical Exam Vitals and nursing note reviewed.  Constitutional:      Appearance: She is well-developed.  HENT:     Head: Normocephalic and atraumatic.  Eyes:     Conjunctiva/sclera: Conjunctivae normal.     Pupils: Pupils are equal, round, and reactive to light.  Cardiovascular:     Rate and Rhythm: Normal rate and regular rhythm.     Heart sounds: Normal heart sounds.  Pulmonary:     Effort: Pulmonary effort is normal.     Breath sounds: Normal breath sounds.  Abdominal:     General: Bowel sounds are normal.     Palpations: Abdomen is soft.     Tenderness: There is abdominal tenderness in the right lower quadrant, suprapubic area and left lower quadrant.     Comments: Tenderness throughout lower abdomen, no peritoneal signs  Musculoskeletal:        General: Normal range of motion.     Cervical back: Normal range of motion.  Skin:    General: Skin is warm and dry.  Neurological:     Mental Status: She is alert and oriented to person, place, and time.     ED Results / Procedures / Treatments   Labs (all labs ordered are listed, but only abnormal results are displayed) Labs Reviewed  CBC WITH DIFFERENTIAL/PLATELET - Abnormal; Notable for the following components:      Result Value   Neutro Abs 7.9 (*)    All other components within normal limits  COMPREHENSIVE METABOLIC PANEL - Abnormal; Notable for the following components:   Glucose, Bld 132 (*)    Creatinine, Ser 1.20 (*)    GFR calc non Af Amer 41 (*)    GFR calc Af Amer 47 (*)    All other components within normal limits    URINALYSIS, ROUTINE W REFLEX MICROSCOPIC - Abnormal; Notable for the following components:   APPearance CLOUDY (*)    Hgb urine dipstick SMALL (*)    Ketones, ur 5 (*)    All other components within normal limits  SARS CORONAVIRUS 2 (TAT 6-24 HRS)  LIPASE, BLOOD    EKG None  Radiology CT ABDOMEN PELVIS W CONTRAST  Result Date: 05/10/2019 CLINICAL DATA:  Abdominal pain acute. EXAM: CT ABDOMEN AND PELVIS WITH CONTRAST TECHNIQUE: Multidetector CT imaging of the abdomen and pelvis was performed using  the standard protocol following bolus administration of intravenous contrast. CONTRAST:  173mL OMNIPAQUE IOHEXOL 300 MG/ML  SOLN COMPARISON:  11/19/2007 FINDINGS: Lower chest: No acute abnormality Hepatobiliary: No focal liver abnormality is seen. No gallstones, gallbladder wall thickening, or biliary dilatation. Pancreas: Unremarkable. No pancreatic ductal dilatation or surrounding inflammatory changes. Spleen: Calcified granulomas identified within the spleen. Adrenals/Urinary Tract: Normal adrenal glands. Bilateral parapelvic cysts identified. The urinary bladder is unremarkable. Stomach/Bowel: Mild distention of the gastric fundus. Increase caliber of the proximal small bowel loops which measure up to 3.1 cm. Dilated small bowel loops inter left inguinal canal which is the transition point for small bowel obstruction. The small bowel loops distal to the left inguinal hernia are decreased in caliber. No pathologic dilatation the colon the appendix is visualized and appears. Vascular/Lymphatic: Extensive aortic atherosclerosis with branch vessel involvement. No abdominopelvic adenopathy identified. Reproductive: Uterus and bilateral adnexa are unremarkable. Other: Previous ventral abdominal wall herniorrhaphy. Fat containing, midline abdominal wall hernia is noted along the inferior margin of the hernia mesh, image 60/7. Musculoskeletal: Chronic appearing inferior endplate compression deformity is  identified at T10. IMPRESSION: 1. Small bowel obstruction. The transition point for the obstruction at the left inguinal canal which contains a nonobstructed loop of small bowel. 2. Fat containing midline abdominal wall hernia along the inferior margin of the hernia mesh. 3. Chronic appearing inferior endplate compression deformity at T10. Aortic Atherosclerosis (ICD10-I70.0). Electronically Signed   By: Kerby Moors M.D.   On: 05/10/2019 01:50   DG Chest Port 1 View  Result Date: 05/10/2019 CLINICAL DATA:  Vomiting hypoxia EXAM: PORTABLE CHEST 1 VIEW COMPARISON:  12/31/2006 FINDINGS: Stable calcified nodule in the right upper lobe. No focal opacity or pleural effusion. Mild cardiomegaly with aortic atherosclerosis. No pneumothorax. IMPRESSION: No active disease.  Mild cardiomegaly Electronically Signed   By: Donavan Foil M.D.   On: 05/10/2019 02:36    Procedures Procedures (including critical care time)  Medications Ordered in ED Medications  lidocaine (XYLOCAINE) 2 % jelly 1 application (has no administration in time range)  LORazepam (ATIVAN) injection 1 mg (has no administration in time range)  fentaNYL (SUBLIMAZE) injection 50 mcg (50 mcg Intravenous Given 05/09/19 2351)  ondansetron (ZOFRAN) injection 4 mg (4 mg Intravenous Given 05/09/19 2350)  sodium chloride 0.9 % bolus 500 mL (500 mLs Intravenous New Bag/Given 05/09/19 2347)  iohexol (OMNIPAQUE) 300 MG/ML solution 100 mL (100 mLs Intravenous Contrast Given 05/10/19 0108)  ondansetron (ZOFRAN) injection 4 mg (4 mg Intravenous Given 05/10/19 0203)    ED Course  I have reviewed the triage vital signs and the nursing notes.  Pertinent labs & imaging results that were available during my care of the patient were reviewed by me and considered in my medical decision making (see chart for details).    MDM Rules/Calculators/A&P  86 year old female presenting to the ED with lower abdominal pain, onset today around 7 PM after dinner.  She  has had vomiting x1.  Denies any diarrhea or constipation symptoms.  She is afebrile and nontoxic.  She does have tenderness all throughout lower abdomen but no peritoneal signs.  Vitals are stable.  Labs pending.  Will obtain CT for further evaluation.  She was given small fluid bolus along with fentanyl and Zofran.  2:01 AM Labs grossly reassuring.  Patient's CT with findings of SBO, transition point in left lower abdomen.  Patient with recurrence of vomiting here x1.  Will give additional medication and place NG tube.  Discussed with general surgery,  Dr. Grandville Silos-- will see in consult, medicine to admit.  2:11 AM Notified by primary RN that patient with brief desaturation to 88%, now on 2L supplemental O2.  Questionable aspiration after vomiting.  Will obtain portable CXR.  NG tube being inserted now.  Discussed with hospitalist, Dr. Cyd Silence who will admit for ongoing care.  2:50 AM CXR without findings of pneumonia/aspiration.  Difficulties with NG tube-- patient is very anxious and cannot relax, clamping down.  Attempted to try this with her drinking water, still without success.  Will give dose of ativan and try again.   Final Clinical Impression(s) / ED Diagnoses Final diagnoses:  SBO (small bowel obstruction) [facility])    Rx / DC Orders ED Discharge Orders    None       Larene Pickett, PA-C 05/10/19 Nicholes Calamity, April, MD 05/10/19 [phone]

## 2019-05-10 NOTE — Anesthesia Preprocedure Evaluation (Signed)
Anesthesia Evaluation  Patient identified by MRN, date of birth, ID band Patient awake    Airway Mallampati: II       Dental   Pulmonary    breath sounds clear to auscultation       Cardiovascular + Valvular Problems/Murmurs AS  Rhythm:Regular + Systolic murmurs    Neuro/Psych    GI/Hepatic Neg liver ROS, History noted. CG   Endo/Other  Hypothyroidism   Renal/GU Renal disease     Musculoskeletal   Abdominal   Peds  Hematology   Anesthesia Other Findings   Reproductive/Obstetrics                             Anesthesia Physical Anesthesia Plan  ASA: III  Anesthesia Plan: General   Post-op Pain Management:    Induction: Intravenous  PONV Risk Score and Plan: 3 and Treatment may vary due to age or medical condition  Airway Management Planned: Oral ETT  Additional Equipment:   Intra-op Plan:   Post-operative Plan: Possible Post-op intubation/ventilation  Informed Consent: I have reviewed the patients History and Physical, chart, labs and discussed the procedure including the risks, benefits and alternatives for the proposed anesthesia with the patient or authorized representative who has indicated his/her understanding and acceptance.     Dental advisory given  Plan Discussed with: Anesthesiologist and CRNA  Anesthesia Plan Comments:         Anesthesia Quick Evaluation

## 2019-05-10 NOTE — H&P (Signed)
History and Physical    Krista Bowman UJW:[id number] DOB: September 19, 1931 DOA: 05/09/2019  PCP: Virgie Dad, MD  Patient coming from: [facility]:  Chief Complaint  Patient presents with  . Abdominal Pain     HPI:    86 year old female with past medical history of chronic kidney disease stage III, hyperlipidemia, vitamin B12 deficiency, aortic stenosis, chronic low back pain, osteoporosis who presents to [facility] emergency department from her skilled nursing facility due to complaints of abdominal pain and vomiting.  Patient is currently unable to answer any questions due to to experiencing significant lethargy after administration of Ativan earlier during her emergency department stay.  Majority of the history has been obtained from the emergency department staff as well as review of notes from patient's skilled nursing facility.  Patient suddenly began to experience lower abdominal pain at approximately 7 PM the evening of 4/12.  Pain was severe in intensity, associated with nausea and one episode of vomiting while the patient skilled nursing facility.  There is no record of the patient exhibiting fevers.  There is no record of the patient complaining of dysuria, diarrhea or sick contacts.  Patient was brought in to [facility] emergency department for evaluation via EMS.  Early after arrival patient continued to complain of severe abdominal pain and experienced a recurrent episode of vomiting with transient hypoxia after this episode prompting the emergency department staff to place the patient on oxygen.  Chest x-ray was performed revealing no evidence of aspiration.  Case was discussed with Dr. Grandville Silos with general surgery who commended the patient be made n.p.o. with NG tube placement and that they would be happy to consult on the patient in the morning.  Hospitalist group was then called to assist the patient for mission the hospital.     Review  of Systems: Unable to perform a review of systems due to patient's severe lethargy.   Past Medical History:  Diagnosis Date  . Cerebral atrophy ([place]) 01/21/13  . Corns and callosities   . Hyperlipidemia   . Hypothyroid   . Osteoarthrosis, unspecified whether generalized or localized, unspecified site   . Osteoporosis, unspecified   . Other and unspecified hyperlipidemia   . Other generalized ischemic cerebrovascular disease 01/21/13   small vessel disease  . Syncope and collapse   . Umbilical hernia without mention of obstruction or gangrene   . Unspecified hearing loss   . Unspecified hypothyroidism   . Unspecified vitamin D deficiency   . Ventral hernia, unspecified, without mention of obstruction or gangrene     Past Surgical History:  Procedure Laterality Date  . CATARACT EXTRACTION W/ INTRAOCULAR LENS  IMPLANT, BILATERAL  2012   Dr. Bing Plume  . HERNIA REPAIR  01/2008   laparoscopic Dr. [name]  . KNEE SURGERY  2008   Dr. Alyssa Grove  left  . TOE AMPUTATION  1986   little toe right foot  Dr. Dema Severin; [facility]     reports that she has never smoked. She has never used smokeless tobacco. She reports that she does not drink alcohol or use drugs.  Allergies  Allergen Reactions  . Mushroom Extract Complex Other (See Comments)    "Allergic," per paperwork from facility  . Penicillins Diarrhea and Other (See Comments)    "Allergic," per paperwork from facility    History reviewed. No pertinent family history.   Prior to Admission medications   Medication Sig Start Date End Date Taking?  Authorizing Provider  aspirin 81 MG tablet Take 81 mg by mouth daily.    [provider]  atorvastatin (LIPITOR) 10 MG tablet Take 1 tablet (10 mg total) by mouth daily. 04/15/19   Virgie Dad, MD  Calcium Carbonate-Vitamin D (CALCIUM 600+D) 600-200 MG-UNIT TABS Take 1 tablet by mouth 2 (two) times daily.    [provider]  cholecalciferol (VITAMIN D) 1000 UNITS tablet  Take 1,000 Units by mouth daily.    [provider]  docusate sodium (COLACE) 100 MG capsule Take 1 capsule (100 mg total) by mouth daily. 06/02/17   Blanchie Serve, MD  levothyroxine (SYNTHROID) 75 MCG tablet TAKE 1 TABLET DAILY 30 MINUTES BEFORE BREAKFAST FOR THYROID 04/22/19   Virgie Dad, MD  Multiple Vitamin (MULTIVITAMIN WITH MINERALS) TABS Take 1 tablet by mouth daily.    [provider]  Omega-3 Fatty Acids (FISH OIL) 1200 MG CAPS Take 1 capsule by mouth daily.    [provider]  vitamin B-12 (CYANOCOBALAMIN) 500 MCG tablet Take 500 mcg by mouth daily.    [provider]    Physical Exam: Vitals:   05/10/19 0145 05/10/19 0215 05/10/19 0245 05/10/19 0300  BP: 125/63 (!) 149/73 (!) 149/66 130/74  Pulse: 90 80 85 83  Resp: (!) 31 (!) 27 15 (!) 26  SpO2: 96% 93% 94% 95%  Weight:      Height:        Constitutional: Extremely lethargic and minimally arousable, currently not in any acute distress. Skin: no rashes, no lesions, poor skin turgor noted. Eyes: Pupils are equally reactive to light.  No evidence of scleral icterus or conjunctival pallor.  ENMT: Mucous membranes are dry. Posterior pharynx clear of any exudate or lesions. Normal dentition.   Neck: normal, supple, no masses, no thyromegaly Respiratory: clear to auscultation bilaterally, notable bibasilar rales without evidence of wheezing. Normal respiratory effort. No accessory muscle use.  Cardiovascular: Regular rate and rhythm, no murmurs / rubs / gallops. No extremity edema. 2+ pedal pulses. No carotid bruits.  Back:   Nontender without crepitus or deformity. Abdomen: Protuberant abdomen that is soft with hypoactive bowel sounds.  Notable tenderness, generalized, upon palpation of the abdomen.  No evidence of intra-abdominal masses.  Positive bowel sounds noted in all quadrants.   Musculoskeletal: No joint deformity upper and lower extremities. Good ROM, no contractures. Normal muscle  tone.  Neurologic: Unable to fully perform neurologic exam due to significant lethargy.  Patient is responsive to both verbal and painful stimuli.  Patient does localize to pain. Psychiatric: Unable to assess due to patient being extremely lethargic.  Patient currently does not seem to possess insight as to her current situation.   Labs on Admission: I have personally reviewed following labs and imaging studies -   CBC: Recent Labs  Lab 05/09/19 2345  WBC 9.2  NEUTROABS 7.9*  HGB 12.1  HCT 36.9  MCV 92.3  PLT 080   Basic Metabolic Panel: Recent Labs  Lab 05/09/19 2345  NA 140  K 4.2  CL 103  CO2 27  GLUCOSE 132*  BUN 22  CREATININE 1.20*  CALCIUM 9.4   GFR: Estimated Creatinine Clearance: 26.6 mL/min (A) (by C-G formula based on SCr of 1.2 mg/dL (H)). Liver Function Tests: Recent Labs  Lab 05/09/19 2345  AST 28  ALT 30  ALKPHOS 53  BILITOT 0.7  PROT 7.0  ALBUMIN 3.6   Recent Labs  Lab 05/09/19 2345  LIPASE 33   No  results for input(s): AMMONIA in the last 168 hours. Coagulation Profile: No results for input(s): INR, PROTIME in the last 168 hours. Cardiac Enzymes: No results for input(s): CKTOTAL, CKMB, CKMBINDEX, TROPONINI in the last 168 hours. BNP (last 3 results) No results for input(s): PROBNP in the last 8760 hours. HbA1C: No results for input(s): HGBA1C in the last 72 hours. CBG: No results for input(s): GLUCAP in the last 168 hours. Lipid Profile: No results for input(s): CHOL, HDL, LDLCALC, TRIG, CHOLHDL, LDLDIRECT in the last 72 hours. Thyroid Function Tests: No results for input(s): TSH, T4TOTAL, FREET4, T3FREE, THYROIDAB in the last 72 hours. Anemia Panel: No results for input(s): VITAMINB12, FOLATE, FERRITIN, TIBC, IRON, RETICCTPCT in the last 72 hours. Urine analysis:    Component Value Date/Time   COLORURINE YELLOW 05/10/2019 0120   APPEARANCEUR CLOUDY (A) 05/10/2019 0120   LABSPEC 1.020 05/10/2019 0120   PHURINE 8.0 05/10/2019  0120   GLUCOSEU NEGATIVE 05/10/2019 0120   HGBUR SMALL (A) 05/10/2019 0120   BILIRUBINUR NEGATIVE 05/10/2019 0120   KETONESUR 5 (A) 05/10/2019 0120   PROTEINUR NEGATIVE 05/10/2019 0120   UROBILINOGEN 0.2 01/22/2012 1220   NITRITE NEGATIVE 05/10/2019 0120   LEUKOCYTESUR NEGATIVE 05/10/2019 0120    Radiological Exams on Admission: CT ABDOMEN PELVIS W CONTRAST  Result Date: 05/10/2019 CLINICAL DATA:  Abdominal pain acute. EXAM: CT ABDOMEN AND PELVIS WITH CONTRAST TECHNIQUE: Multidetector CT imaging of the abdomen and pelvis was performed using the standard protocol following bolus administration of intravenous contrast. CONTRAST:  129m OMNIPAQUE IOHEXOL 300 MG/ML  SOLN COMPARISON:  11/19/2007 FINDINGS: Lower chest: No acute abnormality Hepatobiliary: No focal liver abnormality is seen. No gallstones, gallbladder wall thickening, or biliary dilatation. Pancreas: Unremarkable. No pancreatic ductal dilatation or surrounding inflammatory changes. Spleen: Calcified granulomas identified within the spleen. Adrenals/Urinary Tract: Normal adrenal glands. Bilateral parapelvic cysts identified. The urinary bladder is unremarkable. Stomach/Bowel: Mild distention of the gastric fundus. Increase caliber of the proximal small bowel loops which measure up to 3.1 cm. Dilated small bowel loops inter left inguinal canal which is the transition point for small bowel obstruction. The small bowel loops distal to the left inguinal hernia are decreased in caliber. No pathologic dilatation the colon the appendix is visualized and appears. Vascular/Lymphatic: Extensive aortic atherosclerosis with branch vessel involvement. No abdominopelvic adenopathy identified. Reproductive: Uterus and bilateral adnexa are unremarkable. Other: Previous ventral abdominal wall herniorrhaphy. Fat containing, midline abdominal wall hernia is noted along the inferior margin of the hernia mesh, image 60/7. Musculoskeletal: Chronic appearing inferior  endplate compression deformity is identified at T10. IMPRESSION: 1. Small bowel obstruction. The transition point for the obstruction at the left inguinal canal which contains a nonobstructed loop of small bowel. 2. Fat containing midline abdominal wall hernia along the inferior margin of the hernia mesh. 3. Chronic appearing inferior endplate compression deformity at T10. Aortic Atherosclerosis (ICD10-I70.0). Electronically Signed   By: TKerby MoorsM.D.   On: 05/10/2019 01:50   DG Chest Port 1 View  Result Date: 05/10/2019 CLINICAL DATA:  Vomiting hypoxia EXAM: PORTABLE CHEST 1 VIEW COMPARISON:  12/31/2006 FINDINGS: Stable calcified nodule in the right upper lobe. No focal opacity or pleural effusion. Mild cardiomegaly with aortic atherosclerosis. No pneumothorax. IMPRESSION: No active disease.  Mild cardiomegaly Electronically Signed   By: KDonavan FoilM.D.   On: 05/10/2019 02:36    Telemetry: Personally reviewed.  Rhythm is normal sinus rhythm at 85 bpm without evidence of dynamic ST segment changes.  Assessment/Plan Principal Problem:  Small bowel obstruction (HCC)  Notable small bowel obstruction with a transition point noted at the left inguinal canal  Patient has a known history of previous abdominal hernia repair making the most likely etiology of the patient's bowel obstruction secondary to adhesions  Patient has been made n.p.o.  NG tube placement has been ordered.  Several attempts have been unsuccessful in the emergency department.  They will attempt again prior to admission to the medical floor  Hydrating patient gently with intravenous lactated Ringer's  Case has already been discussed with Dr. Grandville Silos by the ED provider whom we will officially consult in the morning  Low-dose as needed opiate-based analgesics for associated pain  As needed antiemetics  Active Problems:   Hypothyroidism   Holding oral Synthroid due to patient being n.p.o.  Providing patient with  50% dose reduction intravenous Synthroid currently    Stage 3a chronic kidney disease   Strict input and output monitoring  Serial chemistries to monitor renal function and electrolytes.    Lethargy   Acute development of substantial lethargy after administration of Ativan during the emergency department stay.  Patient was quite agitated with attempts of placing her NG tube.  Symptoms should be self-limiting  We will monitor patient closely while in telemetry  Findings of naloxone for bouts of hypoxia    Mixed hyperlipidemia  Holding home regimen of statin therapy until patient is able to tolerate oral intake    Code Status:  Full code Family Communication: Left VM for daughter at her request. Disposition Plan: Patient is anticipated to be discharged to skilled nursing facility once patient has met maximum benefit from current hospitalization.   Consults called: Case has been discussed with Dr. Grandville Silos with general surgery by the emergency department provider.  They are to be formally consulted in the morning. Admission status: Patient will be admitted to Inpatient and is anticipated to remain in the hospital for rater than 2 midnights.   Vernelle Emerald MD [facility] Pager [phone]  If 7PM-7AM, please contact night-coverage [email or web address] Use universal [place] password for that web site. If you do not have the password, please call the hospital operator.  05/10/2019, 4:17 AM

## 2019-05-11 LAB — CBC WITH DIFFERENTIAL/PLATELET
Abs Immature Granulocytes: 0.03 10*3/uL (ref 0.00–0.07)
Basophils Absolute: 0 10*3/uL (ref 0.0–0.1)
Basophils Relative: 0 %
Eosinophils Absolute: 0 10*3/uL (ref 0.0–0.5)
Eosinophils Relative: 0 %
HCT: 31.4 % — ABNORMAL LOW (ref 36.0–46.0)
Hemoglobin: 10.1 g/dL — ABNORMAL LOW (ref 12.0–15.0)
Immature Granulocytes: 0 %
Lymphocytes Relative: 18 %
Lymphs Abs: 1.4 10*3/uL (ref 0.7–4.0)
MCH: 30 pg (ref 26.0–34.0)
MCHC: 32.2 g/dL (ref 30.0–36.0)
MCV: 93.2 fL (ref 80.0–100.0)
Monocytes Absolute: 0.6 10*3/uL (ref 0.1–1.0)
Monocytes Relative: 7 %
Neutro Abs: 6 10*3/uL (ref 1.7–7.7)
Neutrophils Relative %: 75 %
Platelets: 238 10*3/uL (ref 150–400)
RBC: 3.37 MIL/uL — ABNORMAL LOW (ref 3.87–5.11)
RDW: 14.7 % (ref 11.5–15.5)
WBC: 8 10*3/uL (ref 4.0–10.5)
nRBC: 0 % (ref 0.0–0.2)

## 2019-05-11 LAB — COMPREHENSIVE METABOLIC PANEL
ALT: 19 U/L (ref 0–44)
AST: 23 U/L (ref 15–41)
Albumin: 2.8 g/dL — ABNORMAL LOW (ref 3.5–5.0)
Alkaline Phosphatase: 41 U/L (ref 38–126)
Anion gap: 9 (ref 5–15)
BUN: 12 mg/dL (ref 8–23)
CO2: 22 mmol/L (ref 22–32)
Calcium: 8.4 mg/dL — ABNORMAL LOW (ref 8.9–10.3)
Chloride: 109 mmol/L (ref 98–111)
Creatinine, Ser: 1.04 mg/dL — ABNORMAL HIGH (ref 0.44–1.00)
GFR calc Af Amer: 56 mL/min — ABNORMAL LOW (ref >60)
GFR calc non Af Amer: 49 mL/min — ABNORMAL LOW (ref >60)
Glucose, Bld: 104 mg/dL — ABNORMAL HIGH (ref 70–99)
Potassium: 3.9 mmol/L (ref 3.5–5.1)
Sodium: 140 mmol/L (ref 135–145)
Total Bilirubin: 0.6 mg/dL (ref 0.3–1.2)
Total Protein: 5.5 g/dL — ABNORMAL LOW (ref 6.5–8.1)

## 2019-05-11 LAB — MAGNESIUM: Magnesium: 2 mg/dL (ref 1.7–2.4)

## 2019-05-11 MED ORDER — ACETAMINOPHEN 500 MG PO TABS: 1000.0000 mg | ORAL_TABLET | Freq: Four times a day (QID) | ORAL | 0 refills | Status: DC | PRN

## 2019-05-11 MED ORDER — TRAMADOL HCL 50 MG PO TABS: 50.0000 mg | ORAL_TABLET | Freq: Four times a day (QID) | ORAL | 0 refills | Status: DC | PRN

## 2019-05-11 MED ORDER — LEVOTHYROXINE SODIUM 75 MCG PO TABS
75.0000 ug | ORAL_TABLET | Freq: Every day | ORAL | Status: DC
  Administered 2019-05-12: 75 ug via ORAL
  Filled 2019-05-11: qty 1

## 2019-05-11 NOTE — Evaluation (Signed)
Physical Therapy Evaluation Patient Details Name: Krista Bowman MRN: [id number] DOB: 1931-09-15 Today's Date: 05/11/2019   History of Present Illness  Pt is a 86 y/o female with PMH of CKD stage III, vitamin B12 deficiency, arotirc stenosis, chronic LBP, osteoporosis, presenting to ED with abdominal pain and vomiting.  Found to have SBO, s/p emergent laparoscopic L femoral hernia repair 05/10/19.   Clinical Impression  Pt was seen for mobility on RW and to get her up to chair, but then back to bed due to fatigue and wish to get a nap finally.  Pt will need some careful teaching about body mechanics and maintaining alignment, and will recommend strengthening on legs to increase gait endurance and support standing balance.  Follow up with SNF if this can be approved as she is requiring help for all mobility.    Follow Up Recommendations SNF;Supervision for mobility/OOB    Equipment Recommendations  None recommended by PT    Recommendations for Other Services       Precautions / Restrictions Precautions Precaution Comments: abd pain Restrictions Weight Bearing Restrictions: No      Mobility  Bed Mobility Overal bed mobility: Needs Assistance Bed Mobility: Supine to Sit;Sit to Supine     Supine to sit: Mod assist Sit to supine: Mod assist   General bed mobility comments: pt threw herself back against instructions and then felt more abd pain upon returning to bed.  Talked with her about this, pt feels she is better off not to get on her side despite pain  Transfers Overall transfer level: Needs assistance Equipment used: Rolling walker (2 wheeled);1 person hand held assist Transfers: Sit to/from Stand Sit to Stand: Min assist         General transfer comment: min assist to control her fatigue from being up today  Ambulation/Gait Ambulation/Gait assistance: Min guard Gait Distance (Feet): 150 Feet Assistive device: Rolling walker (2 wheeled);1 person hand held  assist Gait Pattern/deviations: Step-to pattern;Step-through pattern;Decreased stride length;Wide base of support;Trunk flexed     General Gait Details: guarding of posture to stand fully upright  Stairs            Wheelchair Mobility    Modified Rankin (Stroke Patients Only)       Balance Overall balance assessment: Mild deficits observed, not formally tested                                           Pertinent Vitals/Pain Pain Assessment: Faces Faces Pain Scale: Hurts even more Pain Location: R abdomen Pain Intervention(s): Limited activity within patient's tolerance;Monitored during session;Repositioned    Home Living Family/patient expects to be discharged to:: Private residence Living Arrangements: Alone Available Help at Discharge: Available PRN/intermittently Type of Home: Independent living facility Home Access: [place]: One level Home Equipment: Walker - 4 wheels;Grab bars - toilet;Grab bars - tub/shower;Shower seat      Prior Function Level of Independence: Independent with assistive device(s)         Comments: using rollator for support of longer trips but can walk without AD usually     Hand Dominance        Extremity/Trunk Assessment   Upper Extremity Assessment Upper Extremity Assessment: Defer to OT evaluation    Lower Extremity Assessment Lower Extremity Assessment: Generalized weakness    Cervical / Trunk Assessment Cervical / Trunk Assessment:  Kyphotic  Communication   Communication: HOH  Cognition Arousal/Alertness: Awake/alert Behavior During Therapy: WFL for tasks assessed/performed Overall Cognitive Status: Within Functional Limits for tasks assessed                                        General Comments General comments (skin integrity, edema, etc.): had a friend visiting her from her ALF    Exercises     Assessment/Plan    PT Assessment Patient needs continued  PT services  PT Problem List Decreased range of motion;Decreased strength;Decreased activity tolerance;Decreased balance;Decreased mobility;Decreased coordination;Decreased knowledge of use of DME;Decreased safety awareness;Decreased skin integrity;Pain       PT Treatment Interventions DME instruction;Gait training;Functional mobility training;Therapeutic activities;Therapeutic exercise;Balance training;Neuromuscular re-education;Patient/family education    PT Goals (Current goals can be found in the Care Plan section)  Acute Rehab PT Goals Patient Stated Goal: rehab and go home PT Goal Formulation: With patient Time For Goal Achievement: 05/25/19 Potential to Achieve Goals: Good    Frequency Min 2X/week   Barriers to discharge Decreased caregiver support home independently normally    Co-evaluation               AM-PAC PT "6 Clicks" Mobility  Outcome Measure Help needed turning from your back to your side while in a flat bed without using bedrails?: A Little Help needed moving from lying on your back to sitting on the side of a flat bed without using bedrails?: A Little         6 Click Score: 6    End of Session Equipment Utilized During Treatment: Gait belt Activity Tolerance: Patient limited by pain;Patient limited by fatigue Patient left: in bed;with call bell/phone within reach;with bed alarm set;with family/visitor present Nurse Communication: Mobility status PT Visit Diagnosis: Unsteadiness on feet (R26.81);Muscle weakness (generalized) (M62.81);Difficulty in walking, not elsewhere classified (R26.2)    Time: [id number] PT Time Calculation (min) (ACUTE ONLY): 37 min   Charges:   PT Evaluation $PT Eval Moderate Complexity: 1 Mod PT Treatments $Gait Training: 8-22 mins       Ramond Dial 05/11/2019, 4:03 PM  Mee Hives, PT MS Acute Rehab Dept. Number: [place]

## 2019-05-11 NOTE — Progress Notes (Signed)
1 Day Post-Op  Subjective: Patient doesn't complain of any pain today.  Hasn't been up much except to bedside commode.  No BM since surgery, but no nausea and is drinking CLD.  ROS: See above, otherwise other systems negative  Objective: Vital signs in last 24 hours: Temp:  [97.8 F (36.6 C)-99 F (37.2 C)] 97.8 F (36.6 C) (04/14 0444) Pulse Rate:  [72-98] 72 (04/14 0444) Resp:  [15-18] 18 (04/14 0444) BP: (100-133)/(48-80) 100/51 (04/14 0444) SpO2:  [91 %-97 %] 94 % (04/14 0444) Arterial Line BP: (140-155)/(55-103) 140/55 (04/13 1125) Last BM Date: 05/09/19  Intake/Output from previous day: 04/13 0701 - 04/14 0700 In: 3050.5 [I.V.:2800.5; IV Piggyback:250] Out: 505 [Urine:500; Blood:5] Intake/Output this shift: Total I/O In: 100 [P.O.:100] Out: 300 [Urine:300]  PE: Abd: soft, appropriately tender, ND, +BS, incisions are c/d/i  Lab Results:  Recent Labs    05/09/19 2345 05/09/19 2345 05/10/19 1041 05/11/19 0447  WBC 9.2  --   --  8.0  HGB 12.1   < > 11.6* 10.1*  HCT 36.9   < > 34.0* 31.4*  PLT 281  --   --  238   < > = values in this interval not displayed.   BMET Recent Labs    05/09/19 2345 05/09/19 2345 05/10/19 1041 05/11/19 0447  NA 140   < > 139 140  K 4.2   < > 3.7 3.9  CL 103  --   --  109  CO2 27  --   --  22  GLUCOSE 132*  --   --  104*  BUN 22  --   --  12  CREATININE 1.20*  --   --  1.04*  CALCIUM 9.4  --   --  8.4*   < > = values in this interval not displayed.   PT/INR No results for input(s): LABPROT, INR in the last 72 hours. CMP     Component Value Date/Time   NA 140 05/11/2019 0447   NA 141 10/30/2015 0000   K 3.9 05/11/2019 0447   CL 109 05/11/2019 0447   CO2 22 05/11/2019 0447   GLUCOSE 104 (H) 05/11/2019 0447   BUN 12 05/11/2019 0447   BUN 15 10/30/2015 0000   CREATININE 1.04 (H) 05/11/2019 0447   CREATININE 1.06 (H) 03/24/2019 0700   CALCIUM 8.4 (L) 05/11/2019 0447   PROT 5.5 (L) 05/11/2019 0447   ALBUMIN 2.8  (L) 05/11/2019 0447   AST 23 05/11/2019 0447   ALT 19 05/11/2019 0447   ALKPHOS 41 05/11/2019 0447   BILITOT 0.6 05/11/2019 0447   GFRNONAA 49 (L) 05/11/2019 0447   GFRNONAA 48 (L) 03/24/2019 0700   GFRAA 56 (L) 05/11/2019 0447   GFRAA 55 (L) 03/24/2019 0700   Lipase     Component Value Date/Time   LIPASE 33 05/09/2019 2345       Studies/Results: CT ABDOMEN PELVIS W CONTRAST  Result Date: 05/10/2019 CLINICAL DATA:  Abdominal pain acute. EXAM: CT ABDOMEN AND PELVIS WITH CONTRAST TECHNIQUE: Multidetector CT imaging of the abdomen and pelvis was performed using the standard protocol following bolus administration of intravenous contrast. CONTRAST:  173mL OMNIPAQUE IOHEXOL 300 MG/ML  SOLN COMPARISON:  11/19/2007 FINDINGS: Lower chest: No acute abnormality Hepatobiliary: No focal liver abnormality is seen. No gallstones, gallbladder wall thickening, or biliary dilatation. Pancreas: Unremarkable. No pancreatic ductal dilatation or surrounding inflammatory changes. Spleen: Calcified granulomas identified within the spleen. Adrenals/Urinary Tract: Normal adrenal glands. Bilateral parapelvic cysts identified.  The urinary bladder is unremarkable. Stomach/Bowel: Mild distention of the gastric fundus. Increase caliber of the proximal small bowel loops which measure up to 3.1 cm. Dilated small bowel loops inter left inguinal canal which is the transition point for small bowel obstruction. The small bowel loops distal to the left inguinal hernia are decreased in caliber. No pathologic dilatation the colon the appendix is visualized and appears. Vascular/Lymphatic: Extensive aortic atherosclerosis with branch vessel involvement. No abdominopelvic adenopathy identified. Reproductive: Uterus and bilateral adnexa are unremarkable. Other: Previous ventral abdominal wall herniorrhaphy. Fat containing, midline abdominal wall hernia is noted along the inferior margin of the hernia mesh, image 60/7. Musculoskeletal:  Chronic appearing inferior endplate compression deformity is identified at T10. IMPRESSION: 1. Small bowel obstruction. The transition point for the obstruction at the left inguinal canal which contains a nonobstructed loop of small bowel. 2. Fat containing midline abdominal wall hernia along the inferior margin of the hernia mesh. 3. Chronic appearing inferior endplate compression deformity at T10. Aortic Atherosclerosis (ICD10-I70.0). Electronically Signed   By: Kerby Moors M.D.   On: 05/10/2019 01:50   DG Chest Port 1 View  Result Date: 05/10/2019 CLINICAL DATA:  Vomiting hypoxia EXAM: PORTABLE CHEST 1 VIEW COMPARISON:  12/31/2006 FINDINGS: Stable calcified nodule in the right upper lobe. No focal opacity or pleural effusion. Mild cardiomegaly with aortic atherosclerosis. No pneumothorax. IMPRESSION: No active disease.  Mild cardiomegaly Electronically Signed   By: Donavan Foil M.D.   On: 05/10/2019 02:36    Anti-infectives: Anti-infectives (From admission, onward)   Start     Dose/Rate Route Frequency Ordered Stop   05/10/19 0930  ceFAZolin (ANCEF) IVPB 2g/100 mL premix     2 g 200 mL/hr over 30 Minutes Intravenous  Once 05/10/19 0847 05/10/19 0952   05/10/19 0851  ceFAZolin (ANCEF) 2-4 GM/100ML-% IVPB    Note to Pharmacy: Block, Sarah   : cabinet override      05/10/19 0851 05/10/19 1002       Assessment/Plan CKD stage III - Cr 1.04 HLD B12 deficiency Moderate to severe Aortic stenosis - EF 60-65% Chronic low back pain Osteoporosis  POD 1, s/p laparoscopic LIH repair for Incarcerated left inguinal hernia causing small bowel obstruction -adv to a soft diet -mobilize, PT/OT ordered yesterday -if patient tolerates her diet she is surgically stable for DC pending what level of care PT/OT feel like she needs.  She [facility] -follow up and narcotic medications will be arranged.  FEN: soft VTE: SCDs ID: Ancef pre-op   LOS: 1 day      Henreitta Cea , [facility] 05/11/2019, 9:13 AM Please see Amion for pager number during day hours 7:00am-4:30pm or 7:00am -11:30am on weekends

## 2019-05-11 NOTE — NC FL2 (Signed)
[place] MEDICAID FL2 LEVEL OF CARE SCREENING TOOL     IDENTIFICATION  Patient Name: Krista Bowman Birthdate: [birth year] Sex: female Admission Date (Current Location): 05/09/2019  [facility] and [place] Number:  Herbalist and Address:  [facility], [place] [address] [place], [place] [address]      Provider Number: [id number]  Attending Physician Name and Address:  [place] Donnamarie Poag, DO  Relative Name and Phone Number:       Current Level of Care: Hospital Recommended Level of Care: [place] Prior Approval Number:    Date Approved/Denied:   PASRR Number: [id number]  Discharge Plan: SNF    Current Diagnoses: Patient Active Problem List   Diagnosis Date Noted  . Small bowel obstruction ([place]) 05/10/2019  . Lethargy 05/10/2019  . Aortic valve stenosis 04/15/2019  . Educated about COVID-19 virus infection 04/13/2019  . Cardiac murmur 05/21/2018  . Weight loss 04/23/2018  . OAB (overactive bladder) 09/29/2017  . B12 deficiency 09/29/2017  . Osteopenia 02/03/2017  . Prediabetes 10/28/2016  . Stage 3a chronic kidney disease 10/28/2016  . History of osteoporosis 10/28/2016  . Bilateral leg edema 10/28/2016  . Leg weakness 05/08/2016  . Low back pain 05/24/2015  . Constipation 01/13/2013  . Right leg pain 12/14/2012  . Abnormality of gait 01/29/2012  . Edema 10/18/2009  . Personal history of fall 06/05/2009  . Mixed hyperlipidemia 04/01/2007  . Hypothyroidism 12/11/2006    Orientation RESPIRATION BLADDER Height & Weight     Self, Time, Situation, Place  Normal Continent Weight: 120 lb (54.4 kg) Height:  5\' 2"  (157.5 cm)  BEHAVIORAL SYMPTOMS/MOOD NEUROLOGICAL BOWEL NUTRITION STATUS      Continent Diet(see discharge summary)  AMBULATORY STATUS COMMUNICATION OF NEEDS Skin   Limited Assist Verbally Surgical wounds(closed incision on abdomen with liquid skin adhesive)                       Personal Care  Assistance Level of Assistance  Bathing, Feeding, Dressing Bathing Assistance: Limited assistance Feeding assistance: Independent Dressing Assistance: Limited assistance     Functional Limitations Info  Sight, Hearing, Speech Sight Info: Adequate Hearing Info: Impaired Speech Info: Adequate    SPECIAL CARE FACTORS FREQUENCY  PT (By licensed PT), OT (By licensed OT)     PT Frequency: 5x week OT Frequency: 5x week            Contractures Contractures Info: Not present    Additional Factors Info  Code Status, Allergies Code Status Info: Full Code Allergies Info: Mushroom Extract Complex, Penicillins           Current Medications (05/11/2019):  This is the current hospital active medication list Current Facility-Administered Medications  Medication Dose Route Frequency Provider Last Rate Last Admin  . acetaminophen (TYLENOL) tablet 1,000 mg  1,000 mg Oral Q6H Saverio Danker, PA-C   1,000 mg at 05/11/19 1437  . enoxaparin (LOVENOX) injection 30 mg  30 mg Subcutaneous Daily Saverio Danker, PA-C   30 mg at 05/11/19 1437  . [START ON 05/12/2019] levothyroxine (SYNTHROID) tablet 75 mcg  75 mcg Oral Q0600 [place] Donnamarie Poag, DO      . methocarbamol (ROBAXIN) tablet 500 mg  500 mg Oral Q8H PRN Saverio Danker, PA-C      . morphine 2 MG/ML injection 1 mg  1 mg Intravenous Q4H PRN Saverio Danker, PA-C       Or  . morphine 2 MG/ML injection 2 mg  2 mg Intravenous Q4H PRN Saverio Danker, PA-C      . ondansetron [facility]) injection 4 mg  4 mg Intravenous Q6H PRN Saverio Danker, PA-C      . traMADol Veatrice Bourbon) tablet 50 mg  50 mg Oral Q6H PRN Saverio Danker, PA-C         Discharge Medications: Please see discharge summary for a list of discharge medications.  Relevant Imaging Results:  Relevant Lab Results:   Additional Information SS#[id number] [place], [place]

## 2019-05-11 NOTE — Evaluation (Signed)
Occupational Therapy Evaluation Patient Details Name: Krista Bowman MRN: [id number] DOB: Nov 28, 1931 Today's Date: 05/11/2019    History of Present Illness Pt is a 86 y/o female with PMH of CKD stage III, vitamin B12 deficiency, arotirc stenosis, chronic LBP, osteoporosis, presenting to ED with abdominal pain and vomiting.  Found to have SBO, s/p emergent laparoscopic L femoral hernia repair 05/10/19.    Clinical Impression   PTA patient independent with ADLs, limited IADLs, mobility using rollator for long distances only.  Admitted for above and limited by problem list below, including mild unsteadiness, L abdominal pain, decreased activity tolerance and generalized weakness.  Pt currently requires min assist for LB ADLs, min guard for transfers and in room mobility (with or without RW).  She will benefit from further OT services while admitted and after dc at SNF level for short term rehab to optimize independence and safety prior to return to independent living apartment. Will follow acutely.     Follow Up Recommendations  SNF;Supervision/Assistance - 24 hour(SNF at [facility] )    Equipment Recommendations  None recommended by OT    Recommendations for Other Services PT consult     Precautions / Restrictions Restrictions Weight Bearing Restrictions: No      Mobility Bed Mobility               General bed mobility comments: OOB in recliner upon entry   Transfers Overall transfer level: Needs assistance   Transfers: Sit to/from Stand Sit to Stand: Min guard         General transfer comment: min guard to close supervision for safety/balance     Balance Overall balance assessment: Mild deficits observed, not formally tested                                         ADL either performed or assessed with clinical judgement   ADL Overall ADL's : Needs assistance/impaired     Grooming: Supervision/safety;Standing   Upper Body Bathing: Set  up;Sitting   Lower Body Bathing: Minimal assistance;Sit to/from stand   Upper Body Dressing : Set up;Sitting   Lower Body Dressing: Minimal assistance;Sit to/from stand   Toilet Transfer: Min guard;Ambulation Toilet Transfer Details (indicate cue type and reason): initally with RW, progressing to no AD  Toileting- Clothing Manipulation and Hygiene: Min guard       Functional mobility during ADLs: Min guard General ADL Comments: pt limited by pain and decreased activity tolerance, mild unsteadiness      Vision         Perception     Praxis      Pertinent Vitals/Pain Pain Assessment: 0-10 Pain Score: 5  Pain Location: L stomach Pain Descriptors / Indicators: Discomfort;Sore Pain Intervention(s): Monitored during session;Repositioned;Limited activity within patient's tolerance     Hand Dominance     Extremity/Trunk Assessment Upper Extremity Assessment Upper Extremity Assessment: Generalized weakness   Lower Extremity Assessment Lower Extremity Assessment: Defer to PT evaluation   Cervical / Trunk Assessment Cervical / Trunk Assessment: Kyphotic   Communication Communication Communication: HOH   Cognition Arousal/Alertness: Awake/alert Behavior During Therapy: WFL for tasks assessed/performed Overall Cognitive Status: Within Functional Limits for tasks assessed                                 General Comments: appears [facility]  General Comments  daughter present and supportive    Exercises     Shoulder Instructions      Home Living Family/patient expects to be discharged to:: Private residence Living Arrangements: Alone Available Help at Discharge: Available PRN/intermittently Type of Home: Independent living facility Home Access: [place]: One level     Bathroom Shower/Tub: Teacher, early years/pre: Handicapped height     Home Equipment: Walker - 4 wheels;Grab bars - toilet;Grab bars - tub/shower;Shower  seat          Prior Functioning/Environment Level of Independence: Independent        Comments: independent with mobility and ADLs, limited IADLs, volunteers in [employer] (reports using rollator for longer distances)        OT Problem List: Decreased strength;Decreased activity tolerance;Impaired balance (sitting and/or standing);Decreased knowledge of use of DME or AE;Pain      OT Treatment/Interventions: Self-care/ADL training;DME and/or AE instruction;Energy conservation;Therapeutic activities;Balance training;Patient/family education    OT Goals(Current goals can be found in the care plan section) Acute Rehab OT Goals Patient Stated Goal: to get back home after rehab OT Goal Formulation: With patient Time For Goal Achievement: 05/25/19 Potential to Achieve Goals: Good  OT Frequency: Min 2X/week   Barriers to D/C:            Co-evaluation              AM-PAC OT "6 Clicks" Daily Activity     Outcome Measure Help from another person eating meals?: None Help from another person taking care of personal grooming?: A Little Help from another person toileting, which includes using toliet, bedpan, or urinal?: A Little Help from another person bathing (including washing, rinsing, drying)?: A Little Help from another person to put on and taking off regular upper body clothing?: A Little Help from another person to put on and taking off regular lower body clothing?: A Little 6 Click Score: 19   End of Session Equipment Utilized During Treatment: Rolling walker Nurse Communication: Mobility status  Activity Tolerance: Patient tolerated treatment well Patient left: in chair;with call bell/phone within reach;with family/visitor present  OT Visit Diagnosis: Other abnormalities of gait and mobility (R26.89);Muscle weakness (generalized) (M62.81);Pain Pain - Right/Left: Left Pain - part of body: (stomach)                Time: [id number] OT Time Calculation (min): 23  min Charges:  OT General Charges $OT Visit: 1 Visit OT Evaluation $OT Eval Moderate Complexity: 1 Mod OT Treatments $Self Care/Home Management : 8-22 mins  Jolaine Artist, OT Acute Rehabilitation Services Pager [phone] Office [phone]    Delight Stare 05/11/2019, 12:24 PM

## 2019-05-11 NOTE — TOC Initial Note (Signed)
Transition of Care [facility]) - Initial/Assessment Note    Patient Details  Name: Krista Bowman MRN: [id number] Date of Birth: 12-03-1931  Transition of Care [facility]) CM/SW Contact:    Alexander Mt, LCSW Phone Number: 05/11/2019, 12:38 PM  Clinical Narrative:                 CSW spoke with pt and pt daughter Hilda Blades at bedside. CSW introduced self, role, and reason for visit. CSW shared that I had been alerted by [facility] that pt was admitting to hospital and that they were going to follow as needed.   Pt from [place], her daughter is a Education officer, museum in [place] and shared that after a previous hernia repair that pt had needed more assistance than they thought and wish that she had gone to SNF for at least a short time at that point. CSW shared that we would reach out to [facility], start insurance authorization and then await therapy evaluations to proceed. CSW confirmed pt daughter cell phone and will be in touch with pt daughter when more updates available as she needs to return to [place] had questions regarding visitation, deferred those to SNF staff as pt and pt daughter are fully vaccinated but this is facility dependent.   CSW spoke with [facility] and she will have a bed available for pt tomorrow. Message securely left with [employer] to initiate authorization.    Expected Discharge Plan: Skilled Nursing Facility Barriers to Discharge: Continued Medical Work up, Ship broker   Patient Goals and CMS Choice Patient states their goals for this hospitalization and ongoing recovery are:: short stay at [facility] before returning to [place] apartment CMS Medicare.gov Compare Post Acute Care list provided to:: Patient(pt is resident at [facility]) Choice offered to / list presented to : Patient, Adult Children  Expected Discharge Plan and Services Expected Discharge Plan: [place] In-house Referral: Clinical Social Work Discharge Planning Services:  CM Consult Post Acute Care Choice: [place], [place] arrangements for the past 2 months: Apartment, [place]  Prior Living Arrangements/Services Living arrangements for the past 2 months: [place], [place] Lives with:: Self, Facility Resident Patient language and need for interpreter reviewed:: Yes(no needs) Do you feel safe going back to the place where you live?: Yes      Need for Family Participation in Patient Care: Yes (Comment)(assistance with daily cares; support) Care giver support system in place?: Yes (comment)(facility staff; adult child) Current home services: DME Criminal Activity/Legal Involvement Pertinent to Current Situation/Hospitalization: No - Comment as needed   Permission Sought/Granted Permission sought to share information with : Family Supports, Customer service manager    Share Information with NAME: [place]  Permission granted to share info w AGENCY: [facility]  Permission granted to share info w Relationship: daughter  Permission granted to share info w Contact Information: [phone]  Emotional Assessment Appearance:: Appears stated age Attitude/Demeanor/Rapport: Engaged, Gracious Affect (typically observed): Accepting, Adaptable, Appropriate, Pleasant Orientation: : Oriented to Self, Oriented to Place, Oriented to  Time, Oriented to Situation Alcohol / Substance Use: Not Applicable Psych Involvement: (n/a)  Admission diagnosis:  Small bowel obstruction ([facility]) [K56.609] SBO (small bowel obstruction) ([place]) [K56.609] Patient Active Problem List   Diagnosis Date Noted  . Small bowel obstruction ([place]) 05/10/2019  . Lethargy 05/10/2019  . Aortic valve stenosis 04/15/2019  . Educated about COVID-19 virus infection 04/13/2019  . Cardiac murmur 05/21/2018  . Weight loss  04/23/2018  . OAB (overactive bladder) 09/29/2017  . B12 deficiency 09/29/2017  .  Osteopenia 02/03/2017  . Prediabetes 10/28/2016  . Stage 3a chronic kidney disease 10/28/2016  . History of osteoporosis 10/28/2016  . Bilateral leg edema 10/28/2016  . Leg weakness 05/08/2016  . Low back pain 05/24/2015  . Constipation 01/13/2013  . Right leg pain 12/14/2012  . Abnormality of gait 01/29/2012  . Edema 10/18/2009  . Personal history of fall 06/05/2009  . Mixed hyperlipidemia 04/01/2007  . Hypothyroidism 12/11/2006   PCP:  Virgie Dad, MD Pharmacy:   [id number] [place], [place] [address] Phone: [phone] Fax: [phone]  [facility] [place], [place] [address] [place] [address] Phone: [phone] Fax: [phone]   Readmission Risk Interventions Readmission Risk Prevention Plan 05/11/2019  Post Dischage Appt Not Complete  Appt Comments plan for SNF  Medication Screening Complete  Transportation Screening Complete  Some recent data might be hidden

## 2019-05-11 NOTE — Progress Notes (Signed)
PROGRESS NOTE    Krista Bowman  [id number] DOB: [birth year] DOA: 05/09/2019 PCP: Virgie Dad, MD    Brief Narrative:  Krista Bowman is an 86 year old Caucasian female with past medical history remarkable for, HLD, vitamin B12 deficiency, aortic stenosis, chronic low back pain, osteoporosis who presented to the emergency department from [facility] [place] [facility] with complaints of abdominal pain and vomiting.  Abdominal symptoms onset 7 PM the evening of 05/09/2019.  Pain severe associated with nausea and one episode of vomiting.  In the ED, chest x-ray was unrevealing.  CT abdomen/pelvis notable for small bowel obstruction.  Case was discussed with general surgery on-call, Dr. Grandville Silos who recommended n.p.o. and placement of NG tube. TRH was consulted for hospital admission.   Assessment & Plan:   Principal Problem:   Small bowel obstruction (HCC) Active Problems:   Mixed hyperlipidemia   Hypothyroidism   Stage 3a chronic kidney disease   Lethargy  Small bowel obstruction Left incarcerated femoral hernia Patient presents with acute onset abdominal pain associated with nausea and vomiting.  CT abdomen/pelvis notable for small bowel obstruction with transition point left inguinal canal with bowel incarceration.  Patient went emergently to the operating room back left femoral hernia repair with mesh by Dr. Rosendo Gros on 05/10/2019. --Currently tolerating clear liquid diet, advancing per general surgery today --Continue mobilization, PT/OT efforts --Pain control with tramadol 50mg  q6h prn and morphine IV q4h prn for severe breakthrough pain --Monitor bowel movements closely, serial abdominal exams  CKD stage IIIa Creatinine 1.04 today, at baseline. --Avoid nephrotoxins, renally dose all medications --DC IV fluids now that diet has been advanced  Hypothyroidism --Continue levothyroxine 75 mcg p.o. daily  HLD: Resume home atorvastatin on discharge   DVT prophylaxis: Lovenox Code  Status: Full code Family Communication: Updated patient's daughter who was present at bedside  Disposition Plan:  Status is: Inpatient  Remains inpatient appropriate because:Unsafe d/c plan and Inpatient level of care appropriate due to severity of illness, awaiting further advancement of diet with toleration   Dispo: The patient is from: [facility] [place] [facility]              Anticipated d/c is to: SNF              Anticipated d/c date is: 1 day              Patient currently is medically stable to d/c.         Consultants:   General surgery  Procedures:   Laparoscopic inguinal hernia repair with mesh - Dr. Rosendo Gros, 05/10/2019  Antimicrobials:   Perioperative cefazolin   Subjective: Patient seen and examined bedside, daughter present.  Currently tolerating clear liquid diet, general surgery plans of further advance at this afternoon.  Reports flatus, no bowel movements as of yet.  No other complaints or concerns at this time.  PT recommends SNF placement, awaiting insurance authorization.  Denies headache, no chest pain, no palpitations, no shortness of breath, no abdominal pain.  No acute events overnight per nursing staff.  Objective: Vitals:   05/10/19 1759 05/10/19 2000 05/11/19 0444 05/11/19 1418  BP: (!) 129/53 (!) 133/57 (!) 100/51 118/75  Pulse: 86 97 72 84  Resp: 17 18 18 15   Temp: 98.6 F (37 C) 98.2 F (36.8 C) 97.8 F (36.6 C) 98.6 F (37 C)  TempSrc: Oral Oral Oral   SpO2: 94% 97% 94% 95%  Weight:      Height:  Intake/Output Summary (Last 24 hours) at 05/11/2019 1502 Last data filed at 05/11/2019 1421 Gross per 24 hour  Intake 2218.47 ml  Output 650 ml  Net 1568.47 ml   Filed Weights   05/09/19 2303  Weight: 54.4 kg    Examination:  General exam: Appears calm and comfortable  Respiratory system: Clear to auscultation. Respiratory effort normal. Cardiovascular system: S1 & S2 heard, RRR. No JVD, murmurs,  rubs, gallops or clicks. No pedal edema. Gastrointestinal system: Abdomen is nondistended, soft and nontender. No organomegaly or masses felt. Normal bowel sounds heard. Central nervous system: Alert and oriented. No focal neurological deficits. Extremities: Symmetric 5 x 5 power. Skin: No rashes, lesions or ulcers Psychiatry: Judgement and insight appear normal. Mood & affect appropriate.     Data Reviewed: I have personally reviewed following labs and imaging studies  CBC: Recent Labs  Lab 05/09/19 2345 05/10/19 1041 05/11/19 0447  WBC 9.2  --  8.0  NEUTROABS 7.9*  --  6.0  HGB 12.1 11.6* 10.1*  HCT 36.9 34.0* 31.4*  MCV 92.3  --  93.2  PLT 281  --  99991111   Basic Metabolic Panel: Recent Labs  Lab 05/09/19 2345 05/10/19 1041 05/11/19 0447  NA 140 139 140  K 4.2 3.7 3.9  CL 103  --  109  CO2 27  --  22  GLUCOSE 132*  --  104*  BUN 22  --  12  CREATININE 1.20*  --  1.04*  CALCIUM 9.4  --  8.4*  MG  --   --  2.0   GFR: Estimated Creatinine Clearance: 30.7 mL/min (A) (by C-G formula based on SCr of 1.04 mg/dL (H)). Liver Function Tests: Recent Labs  Lab 05/09/19 2345 05/11/19 0447  AST 28 23  ALT 30 19  ALKPHOS 53 41  BILITOT 0.7 0.6  PROT 7.0 5.5*  ALBUMIN 3.6 2.8*   Recent Labs  Lab 05/09/19 2345  LIPASE 33   No results for input(s): AMMONIA in the last 168 hours. Coagulation Profile: No results for input(s): INR, PROTIME in the last 168 hours. Cardiac Enzymes: No results for input(s): CKTOTAL, CKMB, CKMBINDEX, TROPONINI in the last 168 hours. BNP (last 3 results) No results for input(s): PROBNP in the last 8760 hours. HbA1C: No results for input(s): HGBA1C in the last 72 hours. CBG: No results for input(s): GLUCAP in the last 168 hours. Lipid Profile: No results for input(s): CHOL, HDL, LDLCALC, TRIG, CHOLHDL, LDLDIRECT in the last 72 hours. Thyroid Function Tests: No results for input(s): TSH, T4TOTAL, FREET4, T3FREE, THYROIDAB in the last 72  hours. Anemia Panel: No results for input(s): VITAMINB12, FOLATE, FERRITIN, TIBC, IRON, RETICCTPCT in the last 72 hours. Sepsis Labs: No results for input(s): PROCALCITON, LATICACIDVEN in the last 168 hours.  Recent Results (from the past 240 hour(s))  SARS CORONAVIRUS 2 (TAT 6-24 HRS) Nasopharyngeal Nasopharyngeal Swab     Status: None   Collection Time: 05/10/19  2:14 AM   Specimen: Nasopharyngeal Swab  Result Value Ref Range Status   SARS Coronavirus 2 NEGATIVE NEGATIVE Final    Comment: (NOTE) SARS-CoV-2 target nucleic acids are NOT DETECTED. The SARS-CoV-2 RNA is generally detectable in upper and lower respiratory specimens during the acute phase of infection. Negative results do not preclude SARS-CoV-2 infection, do not rule out co-infections with other pathogens, and should not be used as the sole basis for treatment or other patient management decisions. Negative results must be combined with clinical observations, patient history, and epidemiological  information. The expected result is Negative. Fact Sheet for Patients: SugarRoll.be Fact Sheet for Healthcare Providers: https://www.woods-mathews.com/ This test is not yet approved or cleared by the [place] FDA and  has been authorized for detection and/or diagnosis of SARS-CoV-2 by FDA under an Emergency Use Authorization (EUA). This EUA will remain  in effect (meaning this test can be used) for the duration of the COVID-19 declaration under Section 56 4(b)(1) of the Act, 21 U.S.C. section 360bbb-3(b)(1), unless the authorization is terminated or revoked sooner. Performed at [facility], [place] [address], [place], [place] [address]          Radiology Studies: CT ABDOMEN PELVIS W CONTRAST  Result Date: 05/10/2019 CLINICAL DATA:  Abdominal pain acute. EXAM: CT ABDOMEN AND PELVIS WITH CONTRAST TECHNIQUE: Multidetector CT imaging of the abdomen and pelvis was performed  using the standard protocol following bolus administration of intravenous contrast. CONTRAST:  155mL OMNIPAQUE IOHEXOL 300 MG/ML  SOLN COMPARISON:  11/19/2007 FINDINGS: Lower chest: No acute abnormality Hepatobiliary: No focal liver abnormality is seen. No gallstones, gallbladder wall thickening, or biliary dilatation. Pancreas: Unremarkable. No pancreatic ductal dilatation or surrounding inflammatory changes. Spleen: Calcified granulomas identified within the spleen. Adrenals/Urinary Tract: Normal adrenal glands. Bilateral parapelvic cysts identified. The urinary bladder is unremarkable. Stomach/Bowel: Mild distention of the gastric fundus. Increase caliber of the proximal small bowel loops which measure up to 3.1 cm. Dilated small bowel loops inter left inguinal canal which is the transition point for small bowel obstruction. The small bowel loops distal to the left inguinal hernia are decreased in caliber. No pathologic dilatation the colon the appendix is visualized and appears. Vascular/Lymphatic: Extensive aortic atherosclerosis with branch vessel involvement. No abdominopelvic adenopathy identified. Reproductive: Uterus and bilateral adnexa are unremarkable. Other: Previous ventral abdominal wall herniorrhaphy. Fat containing, midline abdominal wall hernia is noted along the inferior margin of the hernia mesh, image 60/7. Musculoskeletal: Chronic appearing inferior endplate compression deformity is identified at T10. IMPRESSION: 1. Small bowel obstruction. The transition point for the obstruction at the left inguinal canal which contains a nonobstructed loop of small bowel. 2. Fat containing midline abdominal wall hernia along the inferior margin of the hernia mesh. 3. Chronic appearing inferior endplate compression deformity at T10. Aortic Atherosclerosis (ICD10-I70.0). Electronically Signed   By: Kerby Moors M.D.   On: 05/10/2019 01:50   DG Chest Port 1 View  Result Date: 05/10/2019 CLINICAL DATA:   Vomiting hypoxia EXAM: PORTABLE CHEST 1 VIEW COMPARISON:  12/31/2006 FINDINGS: Stable calcified nodule in the right upper lobe. No focal opacity or pleural effusion. Mild cardiomegaly with aortic atherosclerosis. No pneumothorax. IMPRESSION: No active disease.  Mild cardiomegaly Electronically Signed   By: Donavan Foil M.D.   On: 05/10/2019 02:36        Scheduled Meds: . acetaminophen  1,000 mg Oral Q6H  . enoxaparin (LOVENOX) injection  30 mg Subcutaneous Daily  . levothyroxine  37.5 mcg Intravenous Daily   Continuous Infusions: . lactated ringers 75 mL/hr at 05/11/19 0649     LOS: 1 day    Time spent: 32 minutes spent on chart review, discussion with nursing staff, consultants, updating family and interview/physical exam; more than 50% of that time was spent in counseling and/or coordination of care.    Kailan Carmen J [place] DO [facility] Available via Epic secure chat 7am-7pm After these hours, please refer to coverage provider listed on [email or web address] 05/11/2019, 3:02 PM

## 2019-05-11 NOTE — Progress Notes (Signed)
Family at bedside and updated on plan of care

## 2019-05-11 NOTE — Progress Notes (Signed)
Called and updated dtr on plan of care.

## 2019-05-11 NOTE — TOC Progression Note (Signed)
Transition of Care [facility]) - Progression Note    Patient Details  Name: Krista Bowman MRN: [id number] Date of Birth: 09/16/1931  Transition of Care [facility]) CM/SW [place], [place] Phone Number: 05/11/2019, 5:21 PM  Clinical Narrative:    Pt auth received, [phone], for [facility]. Bed available tomorrow, updated MD and pt daughter Hilda Blades of approval for SNF. Discharge tomorrow.    Expected Discharge Plan: Skilled Nursing Facility Barriers to Discharge: Continued Medical Work up  Expected Discharge Plan and Services Expected Discharge Plan: [place] In-house Referral: Clinical Social Work Discharge Planning Services: CM Consult Post Acute Care Choice: [place], Durable Medical Equipment Living arrangements for the past 2 months: [place], [place]  Readmission Risk Interventions Readmission Risk Prevention Plan 05/11/2019  Post Dischage Appt Not Complete  Appt Comments plan for SNF  Medication Screening Complete  Transportation Screening Complete  Some recent data might be hidden

## 2019-05-11 NOTE — Progress Notes (Signed)
Instructed pt on IS, needs reinforcement. Pt doing well, Saverio Danker, PA stated to advance to regular food.

## 2019-05-11 NOTE — TOC Progression Note (Signed)
Transition of Care [facility]) - Progression Note    Patient Details  Name: Krista Bowman MRN: [id number] Date of Birth: 24-Dec-1931  Transition of Care [facility]) CM/SW [place], [place] Phone Number: 05/11/2019, 4:21 PM  Clinical Narrative:    Authorization pending, CSW spoke with Marlowe Kays at [facility] and made her aware of therapy notes. CSW called and spoke with Hilda Blades, we discussed ambulance pre auth and she has a family friend that can transport pt when she is ready. Hilda Blades is aware that authorization pending and bed available tomorrow at [facility]. She has this writers contact information for any additional questions.    Expected Discharge Plan: Skilled Nursing Facility Barriers to Discharge: Continued Medical Work up, Ship broker  Expected Discharge Plan and Services Expected Discharge Plan: [place] In-house Referral: Clinical Social Work Discharge Planning Services: CM Consult Post Acute Care Choice: [place], Durable Medical Equipment Living arrangements for the past 2 months: [place], [place]  Readmission Risk Interventions Readmission Risk Prevention Plan 05/11/2019  Post Dischage Appt Not Complete  Appt Comments plan for SNF  Medication Screening Complete  Transportation Screening Complete  Some recent data might be hidden

## 2019-05-12 MED ORDER — TRAMADOL HCL 50 MG PO TABS: 50.0000 mg | ORAL_TABLET | Freq: Four times a day (QID) | ORAL | 0 refills | Status: DC | PRN

## 2019-05-12 NOTE — Progress Notes (Signed)
Report given to Caryl Ada, Therapist, sports at [facility] [employer].

## 2019-05-12 NOTE — Progress Notes (Signed)
[name] to be D/C'd per MD order. Discussed with the patient and all questions fully answered. ? VSS, Skin clean, dry and intact without evidence of skin break down, no evidence of skin tears noted. ? IV catheter discontinued intact. Site without signs and symptoms of complications. Dressing and pressure applied. ? An After Visit Summary was printed and given to the patient with instructions to give to receiving facility. Report called to [facility]. ? Patient instructed to return to ED, call 911, or call MD for any changes in condition.  ? Patient to be escorted via [place], and D/C to [place] via private auto with Olegario Shearer (friend).

## 2019-05-12 NOTE — Progress Notes (Signed)
2 Days Post-Op  Subjective: Patient doing well.  Tolerating a diet and feels well.  + flatus  ROS: See above, otherwise other systems negative  Objective: Vital signs in last 24 hours: Temp:  [98.2 F (36.8 C)-98.6 F (37 C)] 98.2 F (36.8 C) (04/15 0506) Pulse Rate:  [82-86] 82 (04/15 0506) Resp:  [15-17] 17 (04/15 0506) BP: (114-120)/(75-78) 114/75 (04/15 0506) SpO2:  [95 %-98 %] 98 % (04/15 0506) Last BM Date: 05/09/19  Intake/Output from previous day: 04/14 0701 - 04/15 0700 In: 718 [P.O.:718] Out: 1000 [Urine:1000] Intake/Output this shift: No intake/output data recorded.  PE: Abd: soft, appropriately tender, ND, incisions c/d/i, stable lower umbilical/ventral hernia on exam  Lab Results:  Recent Labs    05/09/19 2345 05/09/19 2345 05/10/19 1041 05/11/19 0447  WBC 9.2  --   --  8.0  HGB 12.1   < > 11.6* 10.1*  HCT 36.9   < > 34.0* 31.4*  PLT 281  --   --  238   < > = values in this interval not displayed.   BMET Recent Labs    05/09/19 2345 05/09/19 2345 05/10/19 1041 05/11/19 0447  NA 140   < > 139 140  K 4.2   < > 3.7 3.9  CL 103  --   --  109  CO2 27  --   --  22  GLUCOSE 132*  --   --  104*  BUN 22  --   --  12  CREATININE 1.20*  --   --  1.04*  CALCIUM 9.4  --   --  8.4*   < > = values in this interval not displayed.   PT/INR No results for input(s): LABPROT, INR in the last 72 hours. CMP     Component Value Date/Time   NA 140 05/11/2019 0447   NA 141 10/30/2015 0000   K 3.9 05/11/2019 0447   CL 109 05/11/2019 0447   CO2 22 05/11/2019 0447   GLUCOSE 104 (H) 05/11/2019 0447   BUN 12 05/11/2019 0447   BUN 15 10/30/2015 0000   CREATININE 1.04 (H) 05/11/2019 0447   CREATININE 1.06 (H) 03/24/2019 0700   CALCIUM 8.4 (L) 05/11/2019 0447   PROT 5.5 (L) 05/11/2019 0447   ALBUMIN 2.8 (L) 05/11/2019 0447   AST 23 05/11/2019 0447   ALT 19 05/11/2019 0447   ALKPHOS 41 05/11/2019 0447   BILITOT 0.6 05/11/2019 0447   GFRNONAA 49 (L)  05/11/2019 0447   GFRNONAA 48 (L) 03/24/2019 0700   GFRAA 56 (L) 05/11/2019 0447   GFRAA 55 (L) 03/24/2019 0700   Lipase     Component Value Date/Time   LIPASE 33 05/09/2019 2345       Studies/Results: No results found.  Anti-infectives: Anti-infectives (From admission, onward)   Start     Dose/Rate Route Frequency Ordered Stop   05/10/19 0930  ceFAZolin (ANCEF) IVPB 2g/100 mL premix     2 g 200 mL/hr over 30 Minutes Intravenous  Once 05/10/19 0847 05/10/19 0952   05/10/19 0851  ceFAZolin (ANCEF) 2-4 GM/100ML-% IVPB    Note to Pharmacy: Block, Sarah   : cabinet override      05/10/19 0851 05/10/19 1002       Assessment/Plan CKD stage III - Cr 1.04 HLD B12 deficiency Moderate to severe Aortic stenosis - EF 60-65% Chronic low back pain Osteoporosis  POD 2, s/p laparoscopic LIH repair for Incarcerated left inguinal hernia causing small bowel obstruction -  doing well -stable for DC to SNF today -follow up arranged and script printed for patient  FEN: soft VTE: SCDs ID: Ancef pre-op   LOS: 2 days    Henreitta Cea , [facility] 05/12/2019, 9:25 AM Please see Amion for pager number during day hours 7:00am-4:30pm or 7:00am -11:30am on weekends

## 2019-05-12 NOTE — Progress Notes (Signed)
Physical Therapy Treatment Patient Details Name: Krista Bowman MRN: [id number] DOB: Feb 28, 1931 Today's Date: 05/12/2019    History of Present Illness Pt is a 86 y/o female with PMH of CKD stage III, vitamin B12 deficiency, arotirc stenosis, chronic LBP, osteoporosis, presenting to ED with abdominal pain and vomiting.  Found to have SBO, s/p emergent laparoscopic L femoral hernia repair 05/10/19.     PT Comments    Pt was seen for mobilization today to assess independence and safety, but has limited tolerance for fully independent gait.  Within her room is walking alone with hand on furniture, but on hall with HHA is safe.  Follow up is expected to go to rehab and get her strength back.  Will check back on her if dc is held.      Follow Up Recommendations  SNF;Supervision for mobility/OOB     Equipment Recommendations  None recommended by PT    Recommendations for Other Services       Precautions / Restrictions Precautions Precautions: Fall Precaution Comments: abd pain Restrictions Weight Bearing Restrictions: No    Mobility  Bed Mobility               General bed mobility comments: up in chair when PT arrived  Transfers Overall transfer level: Needs assistance Equipment used: Rolling walker (2 wheeled);1 person hand held assist Transfers: Sit to/from Stand Sit to Stand: Supervision         General transfer comment: supervision for mobility  Ambulation/Gait Ambulation/Gait assistance: Min guard Gait Distance (Feet): 200 Feet Assistive device: 1 person hand held assist Gait Pattern/deviations: Step-through pattern;Wide base of support;Decreased stride length Gait velocity: reduced Gait velocity interpretation: <1.31 ft/sec, indicative of household ambulator General Gait Details: pt has limited arm swing and upper body freedom of mobility with her recent SBO   Stairs             Wheelchair Mobility    Modified Rankin (Stroke Patients Only)        Balance Overall balance assessment: Mild deficits observed, not formally tested                                          Cognition Arousal/Alertness: Awake/alert Behavior During Therapy: WFL for tasks assessed/performed Overall Cognitive Status: Within Functional Limits for tasks assessed                                        Exercises      General Comments General comments (skin integrity, edema, etc.): at end of session her friend arrived to take her home      Pertinent Vitals/Pain Pain Assessment: Faces Faces Pain Scale: Hurts a little bit Pain Location: abdomen Pain Descriptors / Indicators: Dull Pain Intervention(s): Monitored during session;Repositioned    Home Living                      Prior Function            PT Goals (current goals can now be found in the care plan section) Acute Rehab PT Goals Patient Stated Goal: rehab and go home Progress towards PT goals: Progressing toward goals    Frequency    Min 2X/week      PT Plan Current plan remains appropriate  Co-evaluation              AM-PAC PT "6 Clicks" Mobility   Outcome Measure  Help needed turning from your back to your side while in a flat bed without using bedrails?: None Help needed moving from lying on your back to sitting on the side of a flat bed without using bedrails?: A Little Help needed moving to and from a bed to a chair (including a wheelchair)?: A Little Help needed standing up from a chair using your arms (e.g., wheelchair or bedside chair)?: A Little Help needed to walk in hospital room?: A Little Help needed climbing 3-5 steps with a railing? : A Lot 6 Click Score: 18    End of Session Equipment Utilized During Treatment: Gait belt Activity Tolerance: Patient tolerated treatment well Patient left: in chair;with call bell/phone within reach Nurse Communication: Mobility status PT Visit Diagnosis: Unsteadiness on  feet (R26.81);Muscle weakness (generalized) (M62.81);Difficulty in walking, not elsewhere classified (R26.2)     Time: 1354-1410 PT Time Calculation (min) (ACUTE ONLY): 16 min  Charges:  $Gait Training: 8-22 mins                    Ramond Dial 05/12/2019, 4:14 PM  Mee Hives, PT MS Acute Rehab Dept. Number: [place] and [place]

## 2019-05-12 NOTE — TOC Transition Note (Signed)
Transition of Care [facility]) - CM/SW Discharge Note   Patient Details  Name: Krista Bowman MRN: [id number] Date of Birth: [birth year]  Transition of Care [facility]) CM/SW Contact:  Alexander Mt, LCSW Phone Number: 05/12/2019, 12:06 PM   Clinical Narrative:    CSW has left a message with [facility] regarding discharge today. Auth received yesterday 4/14. Pt ride available when confirmation of bed readiness complete. RN Otila Kluver aware. Await return call from [facility].    Final next level of care: [place] Barriers to Discharge: Barriers Resolved   Patient Goals and CMS Choice Patient states their goals for this hospitalization and ongoing recovery are:: short stay at SNF before returning to [place] apartment CMS Medicare.gov Compare Post Acute Care list provided to:: Patient(pt is resident at [facility]) Choice offered to / list presented to : Patient, Adult Children  Discharge Placement PASRR number recieved: 05/11/19            Patient chooses bed at: [facility] Patient to be transferred to facility by: personal vehicle Name of family member notified: pt daughter Hilda Blades Patient and family notified of of transfer: 05/12/19  Discharge Plan and Services In-house Referral: Clinical Social Work Discharge Planning Services: CM Consult Post Acute Care Choice: [place], Durable Medical Equipment           Readmission Risk Interventions Readmission Risk Prevention Plan 05/11/2019  Post Dischage Appt Not Complete  Appt Comments plan for SNF  Medication Screening Complete  Transportation Screening Complete  Some recent data might be hidden

## 2019-05-12 NOTE — Progress Notes (Signed)
Left a message at [facility] for call back for report. No answer at front hall nurses station. Will try again if no return call.

## 2019-05-12 NOTE — Social Work (Signed)
Clinical Social Worker facilitated patient discharge including contacting patient family and facility to confirm patient discharge plans.  Clinical information faxed to facility and family agreeable with plan.  CSW confirmed private transport by friend to [facility]) RN to call [phone] and ask for [facility] nurse   with report prior to discharge.  Clinical Social Worker will sign off for now as social work intervention is no longer needed. Please consult [place] again if new need arises.  Westley Hummer, MSW, LCSW Clinical Social Worker

## 2019-05-12 NOTE — Discharge Summary (Signed)
Physician Discharge Summary  Krista Bowman [id number] DOB: 1931/06/28 DOA: 05/09/2019  PCP: Virgie Dad, MD  Admit date: 05/09/2019 Discharge date: 05/12/2019  Admitted From: [facility] independent [facility] facility Disposition:  [facility]  Recommendations for Outpatient Follow-up:  1. Follow up with PCP in 1-2 weeks 2. Follow-up with [facility] as scheduled  Home Health: No Equipment/Devices: None  Discharge Condition: Stable CODE STATUS: Full code Diet recommendation: Soft diet  History of present illness:  Krista Bowman is an 86 year old Caucasian female with past medical history remarkable for, HLD, vitamin B12 deficiency, aortic stenosis, chronic low back pain, osteoporosis who presented to the emergency department from [facility] with complaints of abdominal pain and vomiting.  Abdominal symptoms onset 7 PM the evening of 05/09/2019.  Pain severe associated with nausea and one episode of vomiting.  In the ED, chest x-ray was unrevealing.  CT abdomen/pelvis notable for small bowel obstruction.  Case was discussed with general surgery on-call, Dr. Grandville Silos who recommended n.p.o. and placement of NG tube. [facility] was consulted for hospital admission.  Hospital course:  Small bowel obstruction Left incarcerated femoral hernia Patient presents with acute onset abdominal pain associated with nausea and vomiting.  CT abdomen/pelvis notable for small bowel obstruction with transition point left inguinal canal with bowel incarceration.  Patient went emergently to the operating room back left femoral hernia repair with mesh by Dr. Rosendo Gros on 05/10/2019.  Patient's diet was slowly advanced and was able to tolerate soft diet.  Will discharge to SNF [facility].  Continue tramadol as needed.  Patient follow-up with general surgery as scheduled.  CKD stage IIIa Creatinine 1.04, at baseline.  Hypothyroidism Continue levothyroxine 75  mcg p.o. daily  HLD: Resume home atorvastatin on discharge  Discharge Diagnoses:  Active Problems:   Mixed hyperlipidemia   Hypothyroidism   Stage 3a chronic kidney disease    Discharge Instructions  Discharge Instructions    Call MD for:  difficulty breathing, headache or visual disturbances   Complete by: As directed    Call MD for:  extreme fatigue   Complete by: As directed    Call MD for:  persistant dizziness or light-headedness   Complete by: As directed    Call MD for:  persistant nausea and vomiting   Complete by: As directed    Call MD for:  redness, tenderness, or Bowman of infection (pain, swelling, redness, odor or green/yellow discharge around incision site)   Complete by: As directed    Call MD for:  severe uncontrolled pain   Complete by: As directed    Call MD for:  temperature >100.4   Complete by: As directed    Diet - low sodium heart healthy   Complete by: As directed    Increase activity slowly   Complete by: As directed      Allergies as of 05/12/2019      Reactions   Mushroom Extract Complex Other (See Comments)   "Allergic," per paperwork from facility   Penicillins Diarrhea, Other (See Comments)   "Allergic," per paperwork from facility      Medication List    TAKE these medications   acetaminophen 500 MG tablet Commonly known as: TYLENOL Take 2 tablets (1,000 mg total) by mouth every 6 (six) hours as needed.   aspirin 81 MG tablet Take 81 mg by mouth daily.   atorvastatin 10 MG tablet Commonly known as: LIPITOR Take 1 tablet (10 mg total) by mouth daily.  Calcium 600+D 600-200 MG-UNIT Tabs Generic drug: Calcium Carbonate-Vitamin D Take 1 tablet by mouth 2 (two) times daily.   cholecalciferol 1000 units tablet Commonly known as: VITAMIN D Take 1,000 Units by mouth daily.   docusate sodium 100 MG capsule Commonly known as: Colace Take 1 capsule (100 mg total) by mouth daily.   Fish Oil 1200 MG Caps Take 1 capsule by mouth  daily.   levothyroxine 75 MCG tablet Commonly known as: SYNTHROID TAKE 1 TABLET DAILY 30 MINUTES BEFORE BREAKFAST FOR THYROID What changed:   how much to take  how to take this  when to take this  additional instructions   multivitamin with minerals Tabs tablet Take 1 tablet by mouth daily.   traMADol 50 MG tablet Commonly known as: ULTRAM Take 1 tablet (50 mg total) by mouth every 6 (six) hours as needed for moderate pain.   vitamin B-12 500 MCG tablet Commonly known as: CYANOCOBALAMIN Take 500 mcg by mouth daily.       Contact information for follow-up providers    [facility], [facility]. Go on 05/31/2019.   Specialty: General Surgery Why: Follow up appointment scheduled for 10:15 AM. Please arrive 30 min prior to appointment time. Bring photo ID and insurance information.  Contact information: [place] [address] [phone]        Virgie Dad, MD. Schedule an appointment as soon as possible for a visit in 1 week(s).   Specialty: Internal Medicine Contact information: [place] [id number] [phone]            Contact information for after-discharge care    Destination    [name] .   Service: Skilled Nursing Contact information: [place] [phone]                 Allergies  Allergen Reactions  . Mushroom Extract Complex Other (See Comments)    "Allergic," per paperwork from facility  . Penicillins Diarrhea and Other (See Comments)    "Allergic," per paperwork from facility    Consultations:  General surgery   Procedures/Studies: CT ABDOMEN PELVIS W CONTRAST  Result Date: 05/10/2019 CLINICAL DATA:  Abdominal pain acute. EXAM: CT ABDOMEN AND PELVIS WITH CONTRAST TECHNIQUE: Multidetector CT imaging of the abdomen and pelvis was performed using the standard protocol following bolus administration of intravenous  contrast. CONTRAST:  180mL OMNIPAQUE IOHEXOL 300 MG/ML  SOLN COMPARISON:  11/19/2007 FINDINGS: Lower chest: No acute abnormality Hepatobiliary: No focal liver abnormality is seen. No gallstones, gallbladder wall thickening, or biliary dilatation. Pancreas: Unremarkable. No pancreatic ductal dilatation or surrounding inflammatory changes. Spleen: Calcified granulomas identified within the spleen. Adrenals/Urinary Tract: Normal adrenal glands. Bilateral parapelvic cysts identified. The urinary bladder is unremarkable. Stomach/Bowel: Mild distention of the gastric fundus. Increase caliber of the proximal small bowel loops which measure up to 3.1 cm. Dilated small bowel loops inter left inguinal canal which is the transition point for small bowel obstruction. The small bowel loops distal to the left inguinal hernia are decreased in caliber. No pathologic dilatation the colon the appendix is visualized and appears. Vascular/Lymphatic: Extensive aortic atherosclerosis with branch vessel involvement. No abdominopelvic adenopathy identified. Reproductive: Uterus and bilateral adnexa are unremarkable. Other: Previous ventral abdominal wall herniorrhaphy. Fat containing, midline abdominal wall hernia is noted along the inferior margin of the hernia mesh, image 60/7. Musculoskeletal: Chronic appearing inferior endplate compression deformity is identified at T10. IMPRESSION: 1.  Small bowel obstruction. The transition point for the obstruction at the left inguinal canal which contains a nonobstructed loop of small bowel. 2. Fat containing midline abdominal wall hernia along the inferior margin of the hernia mesh. 3. Chronic appearing inferior endplate compression deformity at T10. Aortic Atherosclerosis (ICD10-I70.0). Electronically Signed   By: Kerby Moors M.D.   On: 05/10/2019 01:50   DG Chest Port 1 View  Result Date: 05/10/2019 CLINICAL DATA:  Vomiting hypoxia EXAM: PORTABLE CHEST 1 VIEW COMPARISON:  12/31/2006  FINDINGS: Stable calcified nodule in the right upper lobe. No focal opacity or pleural effusion. Mild cardiomegaly with aortic atherosclerosis. No pneumothorax. IMPRESSION: No active disease.  Mild cardiomegaly Electronically Signed   By: Donavan Foil M.D.   On: 05/10/2019 02:36   DG Finger Thumb Left  Result Date: 04/29/2019 CLINICAL DATA:  Pain left thumb. EXAM: LEFT THUMB 2+V COMPARISON:  No recent prior. FINDINGS: No acute soft tissue bony abnormality identified. No evidence of fracture or dislocation. Diffuse osteopenia and mild degenerative change. IMPRESSION: Diffuse osteopenia and mild degenerative change. No acute abnormality identified. Electronically Signed   By: Marcello Moores  Register   On: 04/29/2019 08:34   ECHOCARDIOGRAM COMPLETE  Result Date: 04/12/2019    ECHOCARDIOGRAM REPORT   Patient Name:   KIANGA FOCO Date of Exam: 04/12/2019 Medical Rec #:  [id number]      Height:       60.0 in Accession #:    [id number]     Weight:       119.0 lb Date of Birth:  10-Mar-1931      BSA:          1.497 m Patient Age:    50 years       BP:           142/72 mmHg Patient Gender: F              HR:           66 bpm. Exam Location:  [facility] Procedure: 2D Echo, Cardiac Doppler and Color Doppler Indications:    I35 Aortic stenosis  History:        Patient has prior history of Echocardiogram examinations, most                 recent 10/20/2018. Bowman/Symptoms:Murmur; Risk                 Factors:Dyslipidemia. CKD stage 3. Edema. Pre-diabetes.  Sonographer:    Jessee Avers, RDCS Referring Phys: [place]  1. Left ventricular ejection fraction, by estimation, is 60 to 65%. The left ventricle has normal function. The left ventricle has no regional wall motion abnormalities. Left ventricular diastolic parameters are consistent with Grade I diastolic dysfunction (impaired relaxation).  2. Right ventricular systolic function is normal. The right ventricular size is normal. There is moderately  elevated pulmonary artery systolic pressure.  3. Left atrial size was mildly dilated.  4. The mitral valve is normal in structure. Mild mitral valve regurgitation. No evidence of mitral stenosis.  5. Tricuspid valve regurgitation is moderate.  6. The aortic valve is tricuspid. Aortic valve regurgitation is mild. Moderate to severe aortic valve stenosis. Aortic valve mean gradient measures 37.0 mmHg. Aortic valve Vmax measures 3.91 m/s.  7. The inferior vena cava is normal in size with greater than 50% respiratory variability, suggesting right atrial pressure of 3 mmHg. Comparison(s): No significant change from prior study. 10/20/18 EF 60-65%. Moderate AS 1mmHg mean, 1mmHg peak.  FINDINGS  Left Ventricle: Left ventricular ejection fraction, by estimation, is 60 to 65%. The left ventricle has normal function. The left ventricle has no regional wall motion abnormalities. The left ventricular internal cavity size was normal in size. There is  no left ventricular hypertrophy. Left ventricular diastolic parameters are consistent with Grade I diastolic dysfunction (impaired relaxation). Right Ventricle: The right ventricular size is normal. No increase in right ventricular wall thickness. Right ventricular systolic function is normal. There is moderately elevated pulmonary artery systolic pressure. The tricuspid regurgitant velocity is 3.06 m/s, and with an assumed right atrial pressure of 8 mmHg, the estimated right ventricular systolic pressure is [id number] mmHg. Left Atrium: Left atrial size was mildly dilated. Right Atrium: Right atrial size was normal in size. Pericardium: There is no evidence of pericardial effusion. Mitral Valve: The mitral valve is normal in structure. Normal mobility of the mitral valve leaflets. Moderate mitral annular calcification. Mild mitral valve regurgitation. No evidence of mitral valve stenosis. Tricuspid Valve: The tricuspid valve is normal in structure. Tricuspid valve regurgitation is  moderate . No evidence of tricuspid stenosis. Aortic Valve: The aortic valve is tricuspid. . There is severe thickening and severe calcifcation of the aortic valve. Aortic valve regurgitation is mild. Aortic regurgitation PHT measures 294 msec. Moderate to severe aortic stenosis is present. There is  severe thickening of the aortic valve. There is severe calcifcation of the aortic valve. Aortic valve mean gradient measures 37.0 mmHg. Aortic valve peak gradient measures 61.2 mmHg. Aortic valve area, by VTI measures 0.58 cm. Pulmonic Valve: The pulmonic valve was normal in structure. Pulmonic valve regurgitation is not visualized. No evidence of pulmonic stenosis. Aorta: The aortic root is normal in size and structure. Venous: The inferior vena cava is normal in size with greater than 50% respiratory variability, suggesting right atrial pressure of 3 mmHg. IAS/Shunts: No atrial level shunt detected by color flow Doppler.  LEFT VENTRICLE PLAX 2D LVIDd:         3.60 cm  Diastology LVIDs:         2.40 cm  LV e' lateral:   4.78 cm/s LV PW:         1.10 cm  LV E/e' lateral: 16.7 LV IVS:        1.00 cm  LV e' medial:    3.21 cm/s LVOT diam:     2.00 cm  LV E/e' medial:  24.8 LV SV:         66 LV SV Index:   44 LVOT Area:     3.14 cm  RIGHT VENTRICLE RV Basal diam:  3.20 cm RV S prime:     13.10 cm/s TAPSE (M-mode): 1.4 cm RVSP:           45.5 mmHg LEFT ATRIUM             Index       RIGHT ATRIUM           Index LA diam:        4.10 cm 2.74 cm/m  RA Pressure: 8.00 mmHg LA Vol (A2C):   31.0 ml 20.71 ml/m RA Area:     10.50 cm LA Vol (A4C):   37.0 ml 24.72 ml/m RA Volume:   20.40 ml  13.63 ml/m LA Biplane Vol: 36.8 ml 24.58 ml/m  AORTIC VALVE AV Area (Vmax):    0.64 cm AV Area (Vmean):   0.65 cm AV Area (VTI):     0.58 cm AV Vmax:  391.00 cm/s AV Vmean:          241.800 cm/s AV VTI:            1.150 m AV Peak Grad:      61.2 mmHg AV Mean Grad:      37.0 mmHg LVOT Vmax:         79.20 cm/s LVOT Vmean:         49.800 cm/s LVOT VTI:          0.212 m LVOT/AV VTI ratio: 0.18 AI PHT:            294 msec  AORTA Ao Root diam: 3.30 cm Ao Asc diam:  3.40 cm MITRAL VALVE               TRICUSPID VALVE                            TR Peak grad:   37.5 mmHg                            TR Vmax:        306.00 cm/s MV E velocity: 79.70 cm/s  Estimated RAP:  8.00 mmHg MV A velocity: 86.50 cm/s  RVSP:           45.5 mmHg MV E/A ratio:  0.92                            SHUNTS                            Systemic VTI:  0.21 m                            Systemic Diam: 2.00 cm Candee Furbish MD Electronically signed by Candee Furbish MD Signature Date/Time: 04/12/2019/1:22:35 PM    Final       Subjective: Patient seen and examined bedside, resting comfortably.  Tolerating diet.  Ready for discharge home.  No complaints this morning.  Denies headache, no no fever/chills/night sweats, no nausea/vomiting/diarrhea, no chest pain, no shortness of breath, no abdominal pain.  No acute events overnight per nursing staff.  Discharge Exam: Vitals:   05/11/19 2055 05/12/19 0506  BP: 120/78 114/75  Pulse: 86 82  Resp: 17 17  Temp: 98.5 F (36.9 C) 98.2 F (36.8 C)  SpO2: 97% 98%   Vitals:   05/11/19 0444 05/11/19 1418 05/11/19 2055 05/12/19 0506  BP: (!) 100/51 118/75 120/78 114/75  Pulse: 72 84 86 82  Resp: 18 15 17 17   Temp: 97.8 F (36.6 C) 98.6 F (37 C) 98.5 F (36.9 C) 98.2 F (36.8 C)  TempSrc: Oral  Oral Oral  SpO2: 94% 95% 97% 98%  Weight:      Height:        General: Pt is alert, awake, not in acute distress Cardiovascular: RRR, S1/S2 +, no rubs, no gallops Respiratory: CTA bilaterally, no wheezing, no rhonchi Abdominal: Soft, NT, ND, bowel sounds + Extremities: no edema, no cyanosis    The results of significant diagnostics from this hospitalization (including imaging, microbiology, ancillary and laboratory) are listed below for reference.     Microbiology: Recent Results (from the past 240 hour(s))  SARS  CORONAVIRUS 2 (TAT 6-24 HRS) Nasopharyngeal Nasopharyngeal Swab  Status: None   Collection Time: 05/10/19  2:14 AM   Specimen: Nasopharyngeal Swab  Result Value Ref Range Status   SARS Coronavirus 2 NEGATIVE NEGATIVE Final    Comment: (NOTE) SARS-CoV-2 target nucleic acids are NOT DETECTED. The SARS-CoV-2 RNA is generally detectable in upper and lower respiratory specimens during the acute phase of infection. Negative results do not preclude SARS-CoV-2 infection, do not rule out co-infections with other pathogens, and should not be used as the sole basis for treatment or other patient management decisions. Negative results must be combined with clinical observations, patient history, and epidemiological information. The expected result is Negative. Fact Sheet for Patients: SugarRoll.be Fact Sheet for Healthcare Providers: https://www.woods-mathews.com/ This test is not yet approved or cleared by the [place] FDA and  has been authorized for detection and/or diagnosis of SARS-CoV-2 by FDA under an Emergency Use Authorization (EUA). This EUA will remain  in effect (meaning this test can be used) for the duration of the COVID-19 declaration under Section 56 4(b)(1) of the Act, 21 U.S.C. section 360bbb-3(b)(1), unless the authorization is terminated or revoked sooner. Performed at [facility], [place] [address] [place], [place] [address]      Labs: BNP (last 3 results) No results for input(s): BNP in the last 8760 hours. Basic Metabolic Panel: Recent Labs  Lab 05/09/19 2345 05/10/19 1041 05/11/19 0447  NA 140 139 140  K 4.2 3.7 3.9  CL 103  --  109  CO2 27  --  22  GLUCOSE 132*  --  104*  BUN 22  --  12  CREATININE 1.20*  --  1.04*  CALCIUM 9.4  --  8.4*  MG  --   --  2.0   Liver Function Tests: Recent Labs  Lab 05/09/19 2345 05/11/19 0447  AST 28 23  ALT 30 19  ALKPHOS 53 41  BILITOT 0.7 0.6  PROT 7.0 5.5*   ALBUMIN 3.6 2.8*   Recent Labs  Lab 05/09/19 2345  LIPASE 33   No results for input(s): AMMONIA in the last 168 hours. CBC: Recent Labs  Lab 05/09/19 2345 05/10/19 1041 05/11/19 0447  WBC 9.2  --  8.0  NEUTROABS 7.9*  --  6.0  HGB 12.1 11.6* 10.1*  HCT 36.9 34.0* 31.4*  MCV 92.3  --  93.2  PLT 281  --  238   Cardiac Enzymes: No results for input(s): CKTOTAL, CKMB, CKMBINDEX, TROPONINI in the last 168 hours. BNP: Invalid input(s): POCBNP CBG: No results for input(s): GLUCAP in the last 168 hours. D-Dimer No results for input(s): DDIMER in the last 72 hours. Hgb A1c No results for input(s): HGBA1C in the last 72 hours. Lipid Profile No results for input(s): CHOL, HDL, LDLCALC, TRIG, CHOLHDL, LDLDIRECT in the last 72 hours. Thyroid function studies No results for input(s): TSH, T4TOTAL, T3FREE, THYROIDAB in the last 72 hours.  Invalid input(s): FREET3 Anemia work up No results for input(s): VITAMINB12, FOLATE, FERRITIN, TIBC, IRON, RETICCTPCT in the last 72 hours. Urinalysis    Component Value Date/Time   COLORURINE YELLOW 05/10/2019 0120   APPEARANCEUR CLOUDY (A) 05/10/2019 0120   LABSPEC 1.020 05/10/2019 0120   PHURINE 8.0 05/10/2019 0120   GLUCOSEU NEGATIVE 05/10/2019 0120   HGBUR SMALL (A) 05/10/2019 0120   BILIRUBINUR NEGATIVE 05/10/2019 0120   KETONESUR 5 (A) 05/10/2019 0120   PROTEINUR NEGATIVE 05/10/2019 0120   UROBILINOGEN 0.2 01/22/2012 1220   NITRITE NEGATIVE 05/10/2019 0120   LEUKOCYTESUR NEGATIVE 05/10/2019 0120  Sepsis Labs Invalid input(s): PROCALCITONIN,  WBC,  LACTICIDVEN Microbiology Recent Results (from the past 240 hour(s))  SARS CORONAVIRUS 2 (TAT 6-24 HRS) Nasopharyngeal Nasopharyngeal Swab     Status: None   Collection Time: 05/10/19  2:14 AM   Specimen: Nasopharyngeal Swab  Result Value Ref Range Status   SARS Coronavirus 2 NEGATIVE NEGATIVE Final    Comment: (NOTE) SARS-CoV-2 target nucleic acids are NOT DETECTED. The  SARS-CoV-2 RNA is generally detectable in upper and lower respiratory specimens during the acute phase of infection. Negative results do not preclude SARS-CoV-2 infection, do not rule out co-infections with other pathogens, and should not be used as the sole basis for treatment or other patient management decisions. Negative results must be combined with clinical observations, patient history, and epidemiological information. The expected result is Negative. Fact Sheet for Patients: SugarRoll.be Fact Sheet for Healthcare Providers: https://www.woods-mathews.com/ This test is not yet approved or cleared by the [place] FDA and  has been authorized for detection and/or diagnosis of SARS-CoV-2 by FDA under an Emergency Use Authorization (EUA). This EUA will remain  in effect (meaning this test can be used) for the duration of the COVID-19 declaration under Section 56 4(b)(1) of the Act, 21 U.S.C. section 360bbb-3(b)(1), unless the authorization is terminated or revoked sooner. Performed at [facility], [place] [address] [place], [place] [address]      Time coordinating discharge: Over 30 minutes  SIGNED:   Stephenson Cichy J [place] DO  [facility] 05/12/2019, 10:33 AM

## 2019-05-13 ENCOUNTER — Non-Acute Institutional Stay (SKILLED_NURSING_FACILITY): Payer: PPO | Admitting: Internal Medicine

## 2019-05-13 ENCOUNTER — Encounter: Payer: Self-pay | Admitting: Internal Medicine

## 2019-05-13 DIAGNOSIS — K4130 Unilateral femoral hernia, with obstruction, without gangrene, not specified as recurrent: Secondary | ICD-10-CM

## 2019-05-13 DIAGNOSIS — I350 Nonrheumatic aortic (valve) stenosis: Secondary | ICD-10-CM

## 2019-05-13 DIAGNOSIS — E039 Hypothyroidism, unspecified: Secondary | ICD-10-CM | POA: Diagnosis not present

## 2019-05-13 DIAGNOSIS — E782 Mixed hyperlipidemia: Secondary | ICD-10-CM

## 2019-05-13 DIAGNOSIS — M79645 Pain in left finger(s): Secondary | ICD-10-CM | POA: Diagnosis not present

## 2019-05-13 DIAGNOSIS — N1831 Chronic kidney disease, stage 3a: Secondary | ICD-10-CM

## 2019-05-13 NOTE — Progress Notes (Signed)
Provider:  Veleta Miners MD Location:   [facility] Room Number: 45 Place of Service:  SNF (31)  PCP: Virgie Dad, MD Patient Care Team: Virgie Dad, MD as PCP - General (Internal Medicine) [name] [name], [name], NP as Nurse Practitioner (Nurse Practitioner) [name], MD as Consulting Physician (Ophthalmology) [name], MD as Consulting Physician (Orthopedic Surgery) [name], [name], MD as Consulting Physician (Dermatology) [name], MD (Inactive) as Consulting Physician (Gastroenterology) [name], MD as Consulting Physician (General Surgery)  Extended Emergency Contact Information Primary Emergency Contact: Vicenta Dunning, [place] [employer] Phone: [phone] Relation: Daughter  Code Status: Full Code Goals of Care: Advanced Directive information Advanced Directives 05/13/2019  Does Patient Have a Medical Advance Directive? Yes  Type of Advance Directive [place]  Does patient want to make changes to medical advance directive? No - Patient declined  Copy of [place] in Chart? Yes - validated most recent copy scanned in chart (See row information)  Pre-existing out of facility DNR order (yellow form or pink MOST form) -      Chief Complaint  Patient presents with  . New Admit To SNF    Admission    HPI: Patient is a 86 y.o. Bowman seen today for admission to SNF for Therapy.  Admitted to Hospital from 04/12-04/15 for SBO due to Left Incarcerated Femoral Hernia.  Patient has a history of hypothyroidism, B12 deficiency, hyperlipidemia, CKD stage III, osteopenia and aortic stenosis and arthritis She lives by herself in [place] apartment Was sent to the hospital for acute onset of abdominal pain with nausea and vomiting.  CT scan showed SBO.  With possibility bowel incarceration.  Was taken to the OR and underwent repair with mesh on 4/13.     She did well postop.  Her diet was eventually advanced and now she is in SNF for therapy  Patient is doing really well.  Already walking.  She says she does not have very good appetite and has not had a good bowel movement yet.  But is passing gas.  Denies any abdominal pain.  No nausea or vomiting no fever. Past Medical History:  Diagnosis Date  . Cerebral atrophy ([place]) 01/21/13  . Corns and callosities   . Hyperlipidemia   . Hypothyroid   . Osteoarthrosis, unspecified whether generalized or localized, unspecified site   . Osteoporosis, unspecified   . Other and unspecified hyperlipidemia   . Other generalized ischemic cerebrovascular disease 01/21/13   small vessel disease  . Syncope and collapse   . Umbilical hernia without mention of obstruction or gangrene   . Unspecified hearing loss   . Unspecified hypothyroidism   . Unspecified vitamin D deficiency   . Ventral hernia, unspecified, without mention of obstruction or gangrene    Past Surgical History:  Procedure Laterality Date  . CATARACT EXTRACTION W/ INTRAOCULAR LENS  IMPLANT, BILATERAL  2012   Dr. Bing Plume  . HERNIA REPAIR  01/2008   laparoscopic Dr. [name]  . INGUINAL HERNIA REPAIR N/A 05/10/2019   Procedure: LAPAROSCOPIC FEMORAL HERNIA REPAIR WITH MESH;  Surgeon: Ralene Ok, MD;  Location: [facility];  Service: General;  Laterality: N/A;  . KNEE SURGERY  2008   Dr. Alyssa Grove  left  . TOE AMPUTATION  1986   little toe right foot  Dr. Dema Severin; [facility]    reports that she has  never smoked. She has never used smokeless tobacco. She reports that she does not drink alcohol or use drugs. Social History   Socioeconomic History  . Marital status: Widowed    Spouse name: Not on file  . Number of children: Not on file  . Years of education: Not on file  . Highest education level: Not on file  Occupational History  . Occupation: retired Psychologist, counselling: RETIRED  Tobacco Use  . Smoking status: Never  Smoker  . Smokeless tobacco: Never Used  Substance and Sexual Activity  . Alcohol use: No  . Drug use: No  . Sexual activity: Never  Other Topics Concern  . Not on file  Social History Narrative   Lives at [facility] 03/2005   Widow   Living Will   Never smoked   No alcohol    Social Determinants of Health   Financial Resource Strain:   . Difficulty of Paying Living Expenses:   Food Insecurity:   . Worried About Charity fundraiser in the Last Year:   . Arboriculturist in the Last Year:   Transportation Needs:   . Film/video editor (Medical):   Marland Kitchen Lack of Transportation (Non-Medical):   Physical Activity:   . Days of Exercise per Week:   . Minutes of Exercise per Session:   Stress:   . Feeling of Stress :   Social Connections:   . Frequency of Communication with Friends and Family:   . Frequency of Social Gatherings with Friends and Family:   . Attends Religious Services:   . Active Member of Clubs or Organizations:   . Attends Archivist Meetings:   Marland Kitchen Marital Status:   Intimate Partner Violence:   . Fear of Current or Ex-Partner:   . Emotionally Abused:   Marland Kitchen Physically Abused:   . Sexually Abused:     Functional Status Survey:    History reviewed. No pertinent family history.  Health Maintenance  Topic Date Due  . INFLUENZA VACCINE  08/28/2019  . TETANUS/TDAP  05/Krista/2029  . DEXA SCAN  Completed  . PNA vac Low Risk Adult  Completed    Allergies  Allergen Reactions  . Mushroom Extract Complex Other (See Comments)    "Allergic," per paperwork from facility  . Penicillins Diarrhea and Other (See Comments)    "Allergic," per paperwork from facility    Allergies as of 05/13/2019      Reactions   Mushroom Extract Complex Other (See Comments)   "Allergic," per paperwork from facility   Penicillins Diarrhea, Other (See Comments)   "Allergic," per paperwork from facility      Medication List       Accurate as of May 13, 2019   3:08 PM. If you have any questions, ask your nurse or doctor.        acetaminophen 500 MG tablet Commonly known as: TYLENOL Take 2 tablets (1,000 mg total) by mouth every 6 (six) hours as needed.   aspirin 81 MG tablet Take 81 mg by mouth daily.   atorvastatin 10 MG tablet Commonly known as: LIPITOR Take 1 tablet (10 mg total) by mouth daily.   Calcium 600+D 600-200 MG-UNIT Tabs Generic drug: Calcium Carbonate-Vitamin D Take 1 tablet by mouth 2 (two) times daily.   cholecalciferol 1000 units tablet Commonly known as: VITAMIN D Take 1,000 Units by mouth daily.   docusate sodium 100 MG capsule Commonly known as: Colace Take 1 capsule (  100 mg total) by mouth daily.   Fish Oil 1200 MG Caps Take 1 capsule by mouth daily.   levothyroxine 75 MCG tablet Commonly known as: SYNTHROID TAKE 1 TABLET DAILY 30 MINUTES BEFORE BREAKFAST FOR THYROID What changed:   how much to take  how to take this  when to take this  additional instructions   multivitamin with minerals Tabs tablet Take 1 tablet by mouth daily.   traMADol 50 MG tablet Commonly known as: ULTRAM Take 1 tablet (50 mg total) by mouth every 6 (six) hours as needed for moderate pain.   vitamin B-12 500 MCG tablet Commonly known as: CYANOCOBALAMIN Take 500 mcg by mouth daily.       Review of Systems  Review of Systems  Constitutional: Negative for activity change, appetite change, chills, diaphoresis, fatigue and fever.  HENT: Negative for mouth sores, postnasal drip, rhinorrhea, sinus pain and sore throat.   Respiratory: Negative for apnea, cough, chest tightness, shortness of breath and wheezing.   Cardiovascular: Negative for chest pain, palpitations and leg swelling.  Gastrointestinal: Negative for abdominal distention, abdominal pain, constipation, diarrhea, nausea and vomiting.  Genitourinary: Negative for dysuria and frequency.  Musculoskeletal: Negative for arthralgias, joint swelling and myalgias.    Skin: Negative for rash.  Neurological: Negative for dizziness, syncope, weakness, light-headedness and numbness.  Psychiatric/Behavioral: Negative for behavioral problems, confusion and sleep disturbance.     Vitals:   05/13/19 1502  BP: 140/68  Pulse: 64  Resp: 20  Temp: 98 F (36.7 C)  SpO2: 98%  Weight: 117 lb 8 oz (53.3 kg)  Height: 5\' 2"  (1.575 m)   Body mass index is 21.49 kg/m. Physical Exam  Constitutional: Oriented to person, place, and time. Well-developed and well-nourished.  HENT:  Head: Normocephalic.  Mouth/Throat: Oropharynx is clear and moist.  Eyes: Pupils are equal, round, and reactive to light.  Neck: Neck supple.  Cardiovascular: Normal rate and normal heart sounds.  Murmur Present Pulmonary/Chest: Effort normal and breath sounds normal. No respiratory distress. No wheezes. She has no rales.  Abdominal: Soft. Bowel sounds are normal. No distension.  Well healed Incision. Slightly tender Peri umblical  Musculoskeletal: No edema.  Lymphadenopathy: none Neurological: Alert and oriented to person, place, and time. Already walking with walker Skin: Skin is warm and dry.  Psychiatric: Normal mood and affect. Behavior is normal. Thought content normal.    Labs reviewed: Basic Metabolic Panel: Recent Labs    03/24/19 0700 03/24/19 0700 05/09/19 2345 05/10/19 1041 05/11/19 0447  NA 143   < > 140 139 140  K 4.4   < > 4.2 3.7 3.9  CL 107  --  103  --  109  CO2 27  --  27  --  22  GLUCOSE 104*  --  132*  --  104*  BUN 19  --  22  --  12  CREATININE 1.06*  --  1.20*  --  1.04*  CALCIUM 9.4  --  9.4  --  8.4*  MG  --   --   --   --  2.0   < > = values in this interval not displayed.   Liver Function Tests: Recent Labs    03/24/19 0700 05/09/19 2345 05/11/19 0447  AST 22 28 23   ALT 22 30 19   ALKPHOS  --  53 41  BILITOT 0.4 0.7 0.6  PROT 6.7 7.0 5.5*  ALBUMIN  --  3.6 2.8*   Recent Labs  05/09/19 2345  LIPASE 33   No results for  input(s): AMMONIA in the last 8760 hours. CBC: Recent Labs    03/24/19 0700 03/24/19 0700 05/09/19 2345 05/10/19 1041 05/11/19 0447  WBC 5.7  --  9.2  --  8.0  NEUTROABS 2,662  --  7.9*  --  6.0  HGB 12.5   < > 12.1 11.6* 10.1*  HCT 37.5   < > 36.9 34.0* 31.4*  MCV 91.0  --  92.3  --  93.2  PLT 286  --  281  --  238   < > = values in this interval not displayed.   Cardiac Enzymes: No results for input(s): CKTOTAL, CKMB, CKMBINDEX, TROPONINI in the last 8760 hours. BNP: Invalid input(s): POCBNP Lab Results  Component Value Date   HGBA1C 5.6 09/28/2018   Lab Results  Component Value Date   TSH 2.33 03/24/2019   Lab Results  Component Value Date   VITAMINB12 1,410 (H) 09/28/2018   No results found for: FOLATE No results found for: IRON, TIBC, FERRITIN  Imaging and Procedures obtained prior to SNF admission: CT ABDOMEN PELVIS W CONTRAST  Result Date: 05/10/2019 CLINICAL DATA:  Abdominal pain acute. EXAM: CT ABDOMEN AND PELVIS WITH CONTRAST TECHNIQUE: Multidetector CT imaging of the abdomen and pelvis was performed using the standard protocol following bolus administration of intravenous contrast. CONTRAST:  134mL OMNIPAQUE IOHEXOL 300 MG/ML  SOLN COMPARISON:  11/19/2007 FINDINGS: Lower chest: No acute abnormality Hepatobiliary: No focal liver abnormality is seen. No gallstones, gallbladder wall thickening, or biliary dilatation. Pancreas: Unremarkable. No pancreatic ductal dilatation or surrounding inflammatory changes. Spleen: Calcified granulomas identified within the spleen. Adrenals/Urinary Tract: Normal adrenal glands. Bilateral parapelvic cysts identified. The urinary bladder is unremarkable. Stomach/Bowel: Mild distention of the gastric fundus. Increase caliber of the proximal small bowel loops which measure up to 3.1 cm. Dilated small bowel loops inter left inguinal canal which is the transition point for small bowel obstruction. The small bowel loops distal to the left  inguinal hernia are decreased in caliber. No pathologic dilatation the colon the appendix is visualized and appears. Vascular/Lymphatic: Extensive aortic atherosclerosis with branch vessel involvement. No abdominopelvic adenopathy identified. Reproductive: Uterus and bilateral adnexa are unremarkable. Other: Previous ventral abdominal wall herniorrhaphy. Fat containing, midline abdominal wall hernia is noted along the inferior margin of the hernia mesh, image 60/7. Musculoskeletal: Chronic appearing inferior endplate compression deformity is identified at T10. IMPRESSION: 1. Small bowel obstruction. The transition point for the obstruction at the left inguinal canal which contains a nonobstructed loop of small bowel. 2. Fat containing midline abdominal wall hernia along the inferior margin of the hernia mesh. 3. Chronic appearing inferior endplate compression deformity at T10. Aortic Atherosclerosis (ICD10-I70.0). Electronically Signed   By: Kerby Moors M.D.   On: 05/10/2019 01:50   DG Chest Port 1 View  Result Date: 05/10/2019 CLINICAL DATA:  Vomiting hypoxia EXAM: PORTABLE CHEST 1 VIEW COMPARISON:  12/31/2006 FINDINGS: Stable calcified nodule in the right upper lobe. No focal opacity or pleural effusion. Mild cardiomegaly with aortic atherosclerosis. No pneumothorax. IMPRESSION: No active disease.  Mild cardiomegaly Electronically Signed   By: Donavan Foil M.D.   On: 05/10/2019 02:36    Assessment/Plan Femoral hernia of left side Repair due to SBO Is doing well. Pain controlled with Tramadol and Tylenol  Pain of left thumb Xray negative few weeks ago Will Write for Therapy to do Brace  Aortic valve stenosis, Follows with Cardiology Asymptomatic Acquired hypothyroidism TSH normal in  2/21  Stage 3a chronic kidney disease Repeat BMP  Mixed hyperlipidemia On statin    Family/ staff Communication:   Labs/tests ordered: Bmp and CBC  Total time spent in this patient care encounter was   45_  minutes; greater than 50% of the visit spent counseling patient and staff, reviewing records , Labs and coordinating care for problems addressed at this encounter.

## 2019-05-16 ENCOUNTER — Non-Acute Institutional Stay (SKILLED_NURSING_FACILITY): Payer: PPO | Admitting: Nurse Practitioner

## 2019-05-16 ENCOUNTER — Encounter: Payer: Self-pay | Admitting: Nurse Practitioner

## 2019-05-16 DIAGNOSIS — N1831 Chronic kidney disease, stage 3a: Secondary | ICD-10-CM

## 2019-05-16 DIAGNOSIS — E538 Deficiency of other specified B group vitamins: Secondary | ICD-10-CM

## 2019-05-16 DIAGNOSIS — E039 Hypothyroidism, unspecified: Secondary | ICD-10-CM | POA: Diagnosis not present

## 2019-05-16 DIAGNOSIS — E782 Mixed hyperlipidemia: Secondary | ICD-10-CM

## 2019-05-16 MED ORDER — LEVOTHYROXINE SODIUM 75 MCG PO TABS: ORAL_TABLET | ORAL | 3 refills | Status: DC

## 2019-05-16 MED ORDER — ATORVASTATIN CALCIUM 10 MG PO TABS: 10.0000 mg | ORAL_TABLET | Freq: Every day | ORAL | 3 refills | Status: DC

## 2019-05-16 MED ORDER — TRAMADOL HCL 50 MG PO TABS: 50.0000 mg | ORAL_TABLET | Freq: Four times a day (QID) | ORAL | 0 refills | Status: DC | PRN

## 2019-05-16 NOTE — Assessment & Plan Note (Signed)
eGFR 49, underwent [facility] eval, uses NSAIDs iwht caution.

## 2019-05-16 NOTE — Assessment & Plan Note (Signed)
Stable, continue Atorvastatin, Omega 3, ASA, LDL 118 09/28/18

## 2019-05-16 NOTE — Assessment & Plan Note (Signed)
Stable, continue Vit B12 560mcg qd. Vit B12 1410 09/28/2018.

## 2019-05-16 NOTE — Assessment & Plan Note (Signed)
Stable, TSH 2.33 03/24/19, continue Levothyroxine 74mcg qd.

## 2019-05-16 NOTE — Progress Notes (Signed)
Location:   [place] Room Number: 45 Place of Service:  SNF (31)  Provider: Marlana Latus NP  PCP: Virgie Dad, MD Patient Care Team: Virgie Dad, MD as PCP - General (Internal Medicine) [name] Samyukta Cura X, NP as Nurse Practitioner (Nurse Practitioner) [name], MD as Consulting Physician (Ophthalmology) [name], MD as Consulting Physician (Orthopedic Surgery) [name], [name], MD as Consulting Physician (Dermatology) [name], MD (Inactive) as Consulting Physician (Gastroenterology) [name], MD as Consulting Physician (General Surgery)  Extended Emergency Contact Information Primary Emergency Contact: Vicenta Dunning, [place] of [employer] Phone: [phone] Relation: Daughter  Code Status: Full Code Goals of care:  Advanced Directive information Advanced Directives 05/16/2019  Does Patient Have a Medical Advance Directive? Yes  Type of Paramedic of [place];Out of facility DNR (pink MOST or yellow form)  Does patient want to make changes to medical advance directive? No - Patient declined  Copy of [place] in Chart? Yes - validated most recent copy scanned in chart (See row information)  Pre-existing out of facility DNR order (yellow form or pink MOST form) Pink MOST form placed in chart (order not valid for inpatient use)     Allergies  Allergen Reactions  . Mushroom Extract Complex Other (See Comments)    "Allergic," per paperwork from facility  . Penicillins Diarrhea and Other (See Comments)    "Allergic," per paperwork from facility    Chief Complaint  Patient presents with  . Discharge Note    Discharge [place]    HPI:  86 y.o. female with medical history of hypothyroidism, on Levothyroxine 33mg qd, Vit B12 deficiency, on Vit B12 5060m qd,  hyperlipidemia, on Omega 3, ASA, Atorvastatin, stage 3 CKD, and OA, prn Tramadol,  Tylenol.   [facility] hospital stay 4/12-4/15 for incarcerated femoral hernia. abd pain, nausea, vomiting resolved, CT ed showed SBO with possible bowel incarceration, underwent repair with mesh 05/10/19. She has regained physical strength, ADL function, [facility]    Past Medical History:  Diagnosis Date  . Cerebral atrophy ([place]/26/14  . Corns and callosities   . Hyperlipidemia   . Hypothyroid   . Osteoarthrosis, unspecified whether generalized or localized, unspecified site   . Osteoporosis, unspecified   . Other and unspecified hyperlipidemia   . Other generalized ischemic cerebrovascular disease 01/21/13   small vessel disease  . Syncope and collapse   . Umbilical hernia without mention of obstruction or gangrene   . Unspecified hearing loss   . Unspecified hypothyroidism   . Unspecified vitamin D deficiency   . Ventral hernia, unspecified, without mention of obstruction or gangrene     Past Surgical History:  Procedure Laterality Date  . CATARACT EXTRACTION W/ INTRAOCULAR LENS  IMPLANT, BILATERAL  2012   Dr. DiBing Plume. HERNIA REPAIR  01/2008   laparoscopic Dr. DaAlphonsa Overall. INGUINAL HERNIA REPAIR N/A 05/10/2019   Procedure: LAPAROSCOPIC FEMORAL HERNIA REPAIR WITH MESH;  Surgeon: RaRalene OkMD;  [facility];  Laterality: N/A;  . KNEE SURGERY  2008   Dr. DaAlyssa Groveleft  . TOE AMPUTATION  1986   little toe right foot  Dr. WhDema SeverinChArdyth Gala      reports that she has never smoked. She has never used smokeless tobacco. She reports that  she does not drink alcohol or use drugs. Social History   Socioeconomic History  . Marital status: Widowed    Spouse name: Not on file  . Number of children: Not on file  . Years of education: Not on file  . Highest education level: Not on file  Occupational History  . Occupation: retired Psychologist, counselling: RETIRED  Tobacco Use  . Smoking status:  Never Smoker  . Smokeless tobacco: Never Used  Substance and Sexual Activity  . Alcohol use: No  . Drug use: No  . Sexual activity: Never  Other Topics Concern  . Not on file  Social History Narrative   Lives at [facility]   Widow   Living Will   Never smoked   No alcohol    Social Determinants of Health   Financial Resource Strain:   . Difficulty of Paying Living Expenses:   Food Insecurity:   . Worried About Charity fundraiser in the Last Year:   . Arboriculturist in the Last Year:   Transportation Needs:   . Film/video editor (Medical):   Marland Kitchen Lack of Transportation (Non-Medical):   Physical Activity:   . Days of Exercise per Week:   . Minutes of Exercise per Session:   Stress:   . Feeling of Stress :   Social Connections:   . Frequency of Communication with Friends and Family:   . Frequency of Social Gatherings with Friends and Family:   . Attends Religious Services:   . Active Member of Clubs or Organizations:   . Attends Archivist Meetings:   Marland Kitchen Marital Status:   Intimate Partner Violence:   . Fear of Current or Ex-Partner:   . Emotionally Abused:   Marland Kitchen Physically Abused:   . Sexually Abused:    Functional Status Survey:    Allergies  Allergen Reactions  . Mushroom Extract Complex Other (See Comments)    "Allergic," per paperwork from facility  . Penicillins Diarrhea and Other (See Comments)    "Allergic," per paperwork from facility    Pertinent  Health Maintenance Due  Topic Date Due  . INFLUENZA VACCINE  08/28/2019  . DEXA SCAN  Completed  . PNA vac Low Risk Adult  Completed    Medications: Allergies as of 05/16/2019      Reactions   Mushroom Extract Complex Other (See Comments)   "Allergic," per paperwork from facility   Penicillins Diarrhea, Other (See Comments)   "Allergic," per paperwork from facility      Medication List       Accurate as of May 16, 2019 11:59 PM. If you have any questions, ask your  nurse or doctor.        acetaminophen 500 MG tablet Commonly known as: TYLENOL Take 2 tablets (1,000 mg total) by mouth every 6 (six) hours as needed.   aspirin 81 MG tablet Take 81 mg by mouth daily.   atorvastatin 10 MG tablet Commonly known as: LIPITOR Take 1 tablet (10 mg total) by mouth daily.   Calcium 600+D 600-200 MG-UNIT Tabs Generic drug: Calcium Carbonate-Vitamin D Take 1 tablet by mouth 2 (two) times daily.   cholecalciferol 1000 units tablet Commonly known as: VITAMIN D Take 1,000 Units by mouth daily.   docusate sodium 100 MG capsule Commonly known as: Colace Take 1 capsule (100 mg total) by mouth daily.   Fish Oil 1200 MG Caps Take 1 capsule by mouth daily.  levothyroxine 75 MCG tablet Commonly known as: SYNTHROID TAKE 1 TABLET DAILY 30 MINUTES BEFORE BREAKFAST FOR THYROID What changed:   how much to take  how to take this  when to take this  additional instructions   multivitamin with minerals Tabs tablet Take 1 tablet by mouth daily.   traMADol 50 MG tablet Commonly known as: ULTRAM Take 1 tablet (50 mg total) by mouth every 6 (six) hours as needed for moderate pain.   vitamin B-12 500 MCG tablet Commonly known as: CYANOCOBALAMIN Take 500 mcg by mouth daily.       Review of Systems  Constitutional: Negative for activity change, appetite change, fatigue and fever.       #4Ibs in the past 6 months.   HENT: Positive for hearing loss. Negative for congestion and voice change.   Respiratory: Negative for cough, shortness of breath and wheezing.   Cardiovascular: Negative for chest pain, palpitations and leg swelling.  Gastrointestinal: Positive for constipation. Negative for abdominal distention, abdominal pain, diarrhea, nausea and vomiting.       Had a good BM today since surgery.   Genitourinary: Positive for frequency. Negative for difficulty urinating, dysuria and urgency.       2-3x/night.   Musculoskeletal: Positive for  arthralgias and gait problem.       Right lowe back pain is resolved after BM  Skin: Negative for color change and pallor.  Neurological: Negative for speech difficulty, weakness, light-headedness and numbness.  Psychiatric/Behavioral: Negative for agitation, behavioral problems and sleep disturbance. The patient is not nervous/anxious.     Vitals:   05/16/19 1513  BP: 120/60  Pulse: 78  Resp: 18  Temp: 98.1 F (36.7 C)  SpO2: 95%  Weight: 117 lb 8 oz (53.3 kg)  Height: '5\' 2"'  (1.575 m)   Body mass index is 21.49 kg/m. Physical Exam Constitutional:      General: She is not in acute distress.    Appearance: Normal appearance. She is not ill-appearing.  HENT:     Head: Normocephalic and atraumatic.     Nose: Nose normal.     Mouth/Throat:     Mouth: Mucous membranes are moist.  Eyes:     Extraocular Movements: Extraocular movements intact.     Conjunctiva/sclera: Conjunctivae normal.     Pupils: Pupils are equal, round, and reactive to light.  Cardiovascular:     Rate and Rhythm: Normal rate and regular rhythm.     Heart sounds: Murmur present.  Pulmonary:     Effort: Pulmonary effort is normal.     Breath sounds: Normal breath sounds. No wheezing, rhonchi or rales.  Abdominal:     General: There is no distension.     Palpations: Abdomen is soft.     Tenderness: There is no abdominal tenderness. There is no right CVA tenderness, left CVA tenderness, guarding or rebound.  Musculoskeletal:     Cervical back: Normal range of motion and neck supple.     Right lower leg: No edema.     Left lower leg: No edema.     Comments: Unsteady gait. Left wrist bruise, left thumb in brace, pain only overused.   Skin:    General: Skin is warm and dry.     Comments: Knee scars from previous arthroscopic   Neurological:     General: No focal deficit present.     Mental Status: She is alert and oriented to person, place, and time. Mental status is at baseline.  Motor: No weakness.      Coordination: Coordination normal.     Gait: Gait abnormal.  Psychiatric:        Mood and Affect: Mood normal.        Behavior: Behavior normal.        Thought Content: Thought content normal.        Judgment: Judgment normal.     Labs reviewed: Basic Metabolic Panel: Recent Labs    03/24/19 0700 03/24/19 0700 05/09/19 2345 05/10/19 1041 05/11/19 0447  NA 143   < > 140 139 140  K 4.4   < > 4.2 3.7 3.9  CL 107  --  103  --  109  CO2 27  --  27  --  22  GLUCOSE 104*  --  132*  --  104*  BUN 19  --  22  --  12  CREATININE 1.06*  --  1.20*  --  1.04*  CALCIUM 9.4  --  9.4  --  8.4*  MG  --   --   --   --  2.0   < > = values in this interval not displayed.   Liver Function Tests: Recent Labs    03/24/19 0700 05/09/19 2345 05/11/19 0447  AST '22 28 23  ' ALT '22 30 19  ' ALKPHOS  --  53 41  BILITOT 0.4 0.7 0.6  PROT 6.7 7.0 5.5*  ALBUMIN  --  3.6 2.8*   Recent Labs    05/09/19 2345  LIPASE 33   No results for input(s): AMMONIA in the last 8760 hours. CBC: Recent Labs    03/24/19 0700 03/24/19 0700 05/09/19 2345 05/10/19 1041 05/11/19 0447  WBC 5.7  --  9.2  --  8.0  NEUTROABS 2,662  --  7.9*  --  6.0  HGB 12.5   < > 12.1 11.6* 10.1*  HCT 37.5   < > 36.9 34.0* 31.4*  MCV 91.0  --  92.3  --  93.2  PLT 286  --  281  --  238   < > = values in this interval not displayed.   Cardiac Enzymes: No results for input(s): CKTOTAL, CKMB, CKMBINDEX, TROPONINI in the last 8760 hours. BNP: Invalid input(s): POCBNP CBG: No results for input(s): GLUCAP in the last 8760 hours.  Procedures and Imaging Studies During Stay: CT ABDOMEN PELVIS W CONTRAST  Result Date: 05/10/2019 CLINICAL DATA:  Abdominal pain acute. EXAM: CT ABDOMEN AND PELVIS WITH CONTRAST TECHNIQUE: Multidetector CT imaging of the abdomen and pelvis was performed using the standard protocol following bolus administration of intravenous contrast. CONTRAST:  141m OMNIPAQUE IOHEXOL 300 MG/ML  SOLN COMPARISON:   11/19/2007 FINDINGS: Lower chest: No acute abnormality Hepatobiliary: No focal liver abnormality is seen. No gallstones, gallbladder wall thickening, or biliary dilatation. Pancreas: Unremarkable. No pancreatic ductal dilatation or surrounding inflammatory changes. Spleen: Calcified granulomas identified within the spleen. Adrenals/Urinary Tract: Normal adrenal glands. Bilateral parapelvic cysts identified. The urinary bladder is unremarkable. Stomach/Bowel: Mild distention of the gastric fundus. Increase caliber of the proximal small bowel loops which measure up to 3.1 cm. Dilated small bowel loops inter left inguinal canal which is the transition point for small bowel obstruction. The small bowel loops distal to the left inguinal hernia are decreased in caliber. No pathologic dilatation the colon the appendix is visualized and appears. Vascular/Lymphatic: Extensive aortic atherosclerosis with branch vessel involvement. No abdominopelvic adenopathy identified. Reproductive: Uterus and bilateral adnexa are unremarkable. Other: Previous ventral abdominal wall herniorrhaphy.  Fat containing, midline abdominal wall hernia is noted along the inferior margin of the hernia mesh, image 60/7. Musculoskeletal: Chronic appearing inferior endplate compression deformity is identified at T10. IMPRESSION: 1. Small bowel obstruction. The transition point for the obstruction at the left inguinal canal which contains a nonobstructed loop of small bowel. 2. Fat containing midline abdominal wall hernia along the inferior margin of the hernia mesh. 3. Chronic appearing inferior endplate compression deformity at T10. Aortic Atherosclerosis (ICD10-I70.0). Electronically Signed   By: Kerby Moors M.D.   On: 05/10/2019 01:50   DG Chest Port 1 View  Result Date: 05/10/2019 CLINICAL DATA:  Vomiting hypoxia EXAM: PORTABLE CHEST 1 VIEW COMPARISON:  12/31/2006 FINDINGS: Stable calcified nodule in the right upper lobe. No focal opacity or  pleural effusion. Mild cardiomegaly with aortic atherosclerosis. No pneumothorax. IMPRESSION: No active disease.  Mild cardiomegaly Electronically Signed   By: Donavan Foil M.D.   On: 05/10/2019 02:36   DG Finger Thumb Left  Result Date: 04/29/2019 CLINICAL DATA:  Pain left thumb. EXAM: LEFT THUMB 2+V COMPARISON:  No recent prior. FINDINGS: No acute soft tissue bony abnormality identified. No evidence of fracture or dislocation. Diffuse osteopenia and mild degenerative change. IMPRESSION: Diffuse osteopenia and mild degenerative change. No acute abnormality identified. Electronically Signed   By: Marcello Moores  Register   On: 04/29/2019 08:34    Assessment/Plan:   Hypothyroidism Stable, TSH 2.33 03/24/19, continue Levothyroxine 62mg qd.   Stage 3a chronic kidney disease eGFR 49, [place], uses NSAIDs iwht caution.   B12 deficiency Stable, continue Vit B12 5073m qd. Vit B12 1410 09/28/2018.   Mixed hyperlipidemia Stable, continue Atorvastatin, Omega 3, ASA, LDL 118 09/28/18   Patient is being discharged with the following home health services:    Patient is being discharged with the following durable medical equipment:    Patient has been advised to f/u with their PCP in 1-2 weeks to bring them up to date on their rehab stay.  Social services at facility was responsible for arranging this appointment.  Pt was provided with a 30 day supply of prescriptions for medications and refills must be obtained from their PCP.  For controlled substances, a more limited supply may be provided adequate until PCP appointment only.  Future labs/tests needed: none

## 2019-05-17 ENCOUNTER — Encounter: Payer: Self-pay | Admitting: Nurse Practitioner

## 2019-05-18 DIAGNOSIS — R609 Edema, unspecified: Secondary | ICD-10-CM | POA: Diagnosis not present

## 2019-05-18 DIAGNOSIS — K4030 Unilateral inguinal hernia, with obstruction, without gangrene, not specified as recurrent: Secondary | ICD-10-CM | POA: Diagnosis not present

## 2019-05-18 DIAGNOSIS — Z89421 Acquired absence of other right toe(s): Secondary | ICD-10-CM | POA: Diagnosis not present

## 2019-05-18 DIAGNOSIS — K5900 Constipation, unspecified: Secondary | ICD-10-CM | POA: Diagnosis not present

## 2019-05-18 DIAGNOSIS — R2681 Unsteadiness on feet: Secondary | ICD-10-CM | POA: Diagnosis not present

## 2019-05-18 DIAGNOSIS — M2550 Pain in unspecified joint: Secondary | ICD-10-CM | POA: Diagnosis not present

## 2019-05-18 DIAGNOSIS — R011 Cardiac murmur, unspecified: Secondary | ICD-10-CM | POA: Diagnosis not present

## 2019-05-18 DIAGNOSIS — M6281 Muscle weakness (generalized): Secondary | ICD-10-CM | POA: Diagnosis not present

## 2019-05-18 DIAGNOSIS — M810 Age-related osteoporosis without current pathological fracture: Secondary | ICD-10-CM | POA: Diagnosis not present

## 2019-05-18 DIAGNOSIS — R0602 Shortness of breath: Secondary | ICD-10-CM | POA: Diagnosis not present

## 2019-05-18 DIAGNOSIS — E559 Vitamin D deficiency, unspecified: Secondary | ICD-10-CM | POA: Diagnosis not present

## 2019-05-18 DIAGNOSIS — M549 Dorsalgia, unspecified: Secondary | ICD-10-CM | POA: Diagnosis not present

## 2019-05-30 DIAGNOSIS — R2681 Unsteadiness on feet: Secondary | ICD-10-CM | POA: Diagnosis not present

## 2019-05-30 DIAGNOSIS — K4030 Unilateral inguinal hernia, with obstruction, without gangrene, not specified as recurrent: Secondary | ICD-10-CM | POA: Diagnosis not present

## 2019-05-30 DIAGNOSIS — M810 Age-related osteoporosis without current pathological fracture: Secondary | ICD-10-CM | POA: Diagnosis not present

## 2019-05-30 DIAGNOSIS — R011 Cardiac murmur, unspecified: Secondary | ICD-10-CM | POA: Diagnosis not present

## 2019-05-30 DIAGNOSIS — M2550 Pain in unspecified joint: Secondary | ICD-10-CM | POA: Diagnosis not present

## 2019-05-30 DIAGNOSIS — K5900 Constipation, unspecified: Secondary | ICD-10-CM | POA: Diagnosis not present

## 2019-05-30 DIAGNOSIS — E559 Vitamin D deficiency, unspecified: Secondary | ICD-10-CM | POA: Diagnosis not present

## 2019-05-30 DIAGNOSIS — Z89421 Acquired absence of other right toe(s): Secondary | ICD-10-CM | POA: Diagnosis not present

## 2019-05-30 DIAGNOSIS — R0602 Shortness of breath: Secondary | ICD-10-CM | POA: Diagnosis not present

## 2019-05-30 DIAGNOSIS — M6281 Muscle weakness (generalized): Secondary | ICD-10-CM | POA: Diagnosis not present

## 2019-05-30 DIAGNOSIS — R609 Edema, unspecified: Secondary | ICD-10-CM | POA: Diagnosis not present

## 2019-05-30 DIAGNOSIS — M549 Dorsalgia, unspecified: Secondary | ICD-10-CM | POA: Diagnosis not present

## 2019-06-21 ENCOUNTER — Telehealth: Payer: Self-pay | Admitting: Cardiology

## 2019-06-21 NOTE — Telephone Encounter (Signed)
Called patient 06/21/2019 to schedule appt no answer left message

## 2019-07-21 DIAGNOSIS — H906 Mixed conductive and sensorineural hearing loss, bilateral: Secondary | ICD-10-CM | POA: Diagnosis not present

## 2019-07-27 ENCOUNTER — Other Ambulatory Visit: Payer: Self-pay

## 2019-07-27 ENCOUNTER — Emergency Department (HOSPITAL_COMMUNITY): Payer: PPO

## 2019-07-27 ENCOUNTER — Encounter (HOSPITAL_COMMUNITY): Payer: Self-pay

## 2019-07-27 ENCOUNTER — Emergency Department (HOSPITAL_COMMUNITY)
Admission: EM | Admit: 2019-07-27 | Discharge: 2019-07-27 | Disposition: A | Discharge status: Home / Self Care | Payer: PPO | Attending: Emergency Medicine | Admitting: Emergency Medicine

## 2019-07-27 DIAGNOSIS — M160 Bilateral primary osteoarthritis of hip: Secondary | ICD-10-CM | POA: Diagnosis not present

## 2019-07-27 DIAGNOSIS — Y929 Unspecified place or not applicable: Secondary | ICD-10-CM | POA: Insufficient documentation

## 2019-07-27 DIAGNOSIS — W19XXXA Unspecified fall, initial encounter: Secondary | ICD-10-CM | POA: Diagnosis not present

## 2019-07-27 DIAGNOSIS — S0990XA Unspecified injury of head, initial encounter: Secondary | ICD-10-CM | POA: Diagnosis present

## 2019-07-27 DIAGNOSIS — S79911A Unspecified injury of right hip, initial encounter: Secondary | ICD-10-CM | POA: Diagnosis not present

## 2019-07-27 DIAGNOSIS — W2209XA Striking against other stationary object, initial encounter: Secondary | ICD-10-CM | POA: Insufficient documentation

## 2019-07-27 DIAGNOSIS — I517 Cardiomegaly: Secondary | ICD-10-CM | POA: Diagnosis not present

## 2019-07-27 DIAGNOSIS — R58 Hemorrhage, not elsewhere classified: Secondary | ICD-10-CM | POA: Diagnosis not present

## 2019-07-27 DIAGNOSIS — R519 Headache, unspecified: Secondary | ICD-10-CM | POA: Diagnosis not present

## 2019-07-27 DIAGNOSIS — S0101XA Laceration without foreign body of scalp, initial encounter: Secondary | ICD-10-CM

## 2019-07-27 DIAGNOSIS — M2550 Pain in unspecified joint: Secondary | ICD-10-CM | POA: Diagnosis not present

## 2019-07-27 DIAGNOSIS — R531 Weakness: Secondary | ICD-10-CM | POA: Diagnosis not present

## 2019-07-27 DIAGNOSIS — R55 Syncope and collapse: Secondary | ICD-10-CM | POA: Diagnosis not present

## 2019-07-27 DIAGNOSIS — R5381 Other malaise: Secondary | ICD-10-CM | POA: Diagnosis not present

## 2019-07-27 DIAGNOSIS — S79921A Unspecified injury of right thigh, initial encounter: Secondary | ICD-10-CM | POA: Diagnosis not present

## 2019-07-27 DIAGNOSIS — S199XXA Unspecified injury of neck, initial encounter: Secondary | ICD-10-CM | POA: Diagnosis not present

## 2019-07-27 DIAGNOSIS — I10 Essential (primary) hypertension: Secondary | ICD-10-CM | POA: Diagnosis not present

## 2019-07-27 DIAGNOSIS — Y939 Activity, unspecified: Secondary | ICD-10-CM | POA: Diagnosis not present

## 2019-07-27 DIAGNOSIS — S79912A Unspecified injury of left hip, initial encounter: Secondary | ICD-10-CM | POA: Diagnosis not present

## 2019-07-27 DIAGNOSIS — M1611 Unilateral primary osteoarthritis, right hip: Secondary | ICD-10-CM | POA: Diagnosis not present

## 2019-07-27 DIAGNOSIS — Y999 Unspecified external cause status: Secondary | ICD-10-CM | POA: Diagnosis not present

## 2019-07-27 DIAGNOSIS — E039 Hypothyroidism, unspecified: Secondary | ICD-10-CM | POA: Diagnosis not present

## 2019-07-27 DIAGNOSIS — Z7401 Bed confinement status: Secondary | ICD-10-CM | POA: Diagnosis not present

## 2019-07-27 DIAGNOSIS — Z7982 Long term (current) use of aspirin: Secondary | ICD-10-CM | POA: Diagnosis not present

## 2019-07-27 DIAGNOSIS — K469 Unspecified abdominal hernia without obstruction or gangrene: Secondary | ICD-10-CM | POA: Diagnosis not present

## 2019-07-27 LAB — BASIC METABOLIC PANEL
Anion gap: 9 (ref 5–15)
BUN: 23 mg/dL (ref 8–23)
CO2: 28 mmol/L (ref 22–32)
Calcium: 9 mg/dL (ref 8.9–10.3)
Chloride: 105 mmol/L (ref 98–111)
Creatinine, Ser: 1.14 mg/dL — ABNORMAL HIGH (ref 0.44–1.00)
GFR calc Af Amer: 50 mL/min — ABNORMAL LOW (ref >60)
GFR calc non Af Amer: 44 mL/min — ABNORMAL LOW (ref >60)
Glucose, Bld: 98 mg/dL (ref 70–99)
Potassium: 4.1 mmol/L (ref 3.5–5.1)
Sodium: 142 mmol/L (ref 135–145)

## 2019-07-27 LAB — CBC WITH DIFFERENTIAL/PLATELET
Abs Immature Granulocytes: 0.02 10*3/uL (ref 0.00–0.07)
Basophils Absolute: 0 10*3/uL (ref 0.0–0.1)
Basophils Relative: 0 %
Eosinophils Absolute: 0.2 10*3/uL (ref 0.0–0.5)
Eosinophils Relative: 2 %
HCT: 36.3 % (ref 36.0–46.0)
Hemoglobin: 11.9 g/dL — ABNORMAL LOW (ref 12.0–15.0)
Immature Granulocytes: 0 %
Lymphocytes Relative: 31 %
Lymphs Abs: 2.4 10*3/uL (ref 0.7–4.0)
MCH: 30 pg (ref 26.0–34.0)
MCHC: 32.8 g/dL (ref 30.0–36.0)
MCV: 91.4 fL (ref 80.0–100.0)
Monocytes Absolute: 0.4 10*3/uL (ref 0.1–1.0)
Monocytes Relative: 6 %
Neutro Abs: 4.5 10*3/uL (ref 1.7–7.7)
Neutrophils Relative %: 61 %
Platelets: 302 10*3/uL (ref 150–400)
RBC: 3.97 MIL/uL (ref 3.87–5.11)
RDW: 14.5 % (ref 11.5–15.5)
WBC: 7.5 10*3/uL (ref 4.0–10.5)
nRBC: 0 % (ref 0.0–0.2)

## 2019-07-27 MED ORDER — HYDROGEN PEROXIDE 3 % EX SOLN
CUTANEOUS | Status: AC
  Filled 2019-07-27: qty 473

## 2019-07-27 NOTE — ED Provider Notes (Signed)
[facility] DEPT Provider Note   CSN: [id number] Arrival date & time: 07/27/19  1213    History Chief Complaint  Patient presents with  . Fall  . Head Injury    Krista Bowman is a 86 y.o. female with past medical history significant for cerebral atrophy, hyperlipidemia, syncope, aortic valve stenosis who presents for evaluation of fall.  Patient states she was lying on her recliner with her legs up.  Patient states she closed her recliner began to start walking.  Patient states she lost her balance and fell backwards hitting her head on the edge of the coffee table.  Patient denies any prior sudden onset headache, vision changes, weakness, chest pain, shortness of breath, dizziness or lightheadedness.  Patient denies any syncope.  States that she was able to call to her neighbor who called EMS.  Patient with bleeding to posterior occiput.  She denies any current pain aside from her posterior scalp.  Her last tetanus was 2 years ago.  States she does have some pain which began a few months ago to her right midshaft femur.  Denies lightness, dizziness, facial droop, difficulty with word finding, neck pain, neck stiffness, chest pain, shortness of breath abdominal pain, diarrhea, dysuria, paresthesias, weakness, bony tenderness.  Denies additional aggravating or relieving factors.  Has not tried to ambulate since the fall.  EMS states syncope however patient adamantly denies this. States mechanical fall and she remembers the entire incident. No bowel or bladder incontinent.  Denies any anticoagulation  History obtained from patient and past medical records.  No interpreter used.  Attempted to contact patients daughter [phone] for collateral however unable to reach x 2  HPI     Past Medical History:  Diagnosis Date  . Cerebral atrophy ([place]) 01/21/13  . Corns and callosities   . Hyperlipidemia   . Hypothyroid   . Osteoarthrosis, unspecified whether  generalized or localized, unspecified site   . Osteoporosis, unspecified   . Other and unspecified hyperlipidemia   . Other generalized ischemic cerebrovascular disease 01/21/13   small vessel disease  . Syncope and collapse   . Umbilical hernia without mention of obstruction or gangrene   . Unspecified hearing loss   . Unspecified hypothyroidism   . Unspecified vitamin D deficiency   . Ventral hernia, unspecified, without mention of obstruction or gangrene     Patient Active Problem List   Diagnosis Date Noted  . Aortic valve stenosis 04/15/2019  . Educated about COVID-19 virus infection 04/13/2019  . Cardiac murmur 05/21/2018  . Weight loss 04/23/2018  . OAB (overactive bladder) 09/29/2017  . B12 deficiency 09/29/2017  . Osteopenia 02/03/2017  . Prediabetes 10/28/2016  . Stage 3a chronic kidney disease 10/28/2016  . History of osteoporosis 10/28/2016  . Bilateral leg edema 10/28/2016  . Leg weakness 05/08/2016  . Low back pain 05/24/2015  . Constipation 01/13/2013  . Right leg pain 12/14/2012  . Abnormality of gait 01/29/2012  . Edema 10/18/2009  . Personal history of fall 06/05/2009  . Mixed hyperlipidemia 04/01/2007  . Hypothyroidism 12/11/2006    Past Surgical History:  Procedure Laterality Date  . CATARACT EXTRACTION W/ INTRAOCULAR LENS  IMPLANT, BILATERAL  2012   Dr. Bing Plume  . HERNIA REPAIR  01/2008   laparoscopic Dr. [name]  . INGUINAL HERNIA REPAIR N/A 05/10/2019   Procedure: LAPAROSCOPIC FEMORAL HERNIA REPAIR WITH MESH;  Surgeon: Ralene Ok, MD;  Location: [facility];  Service: General;  Laterality: N/A;  . KNEE SURGERY  2008   Dr. Alyssa Grove  left  . TOE AMPUTATION  1986   little toe right foot  Dr. Dema Severin; [place]     OB History   No obstetric history on file.     History reviewed. No pertinent family history.  Social History   Tobacco Use  . Smoking status: Never Smoker  . Smokeless tobacco: Never Used  Vaping Use  . Vaping Use: Never  used  Substance Use Topics  . Alcohol use: No  . Drug use: No    Home Medications Prior to Admission medications   Medication Sig Start Date End Date Taking? Authorizing Provider  acetaminophen (TYLENOL) 500 MG tablet Take 2 tablets (1,000 mg total) by mouth every 6 (six) hours as needed. 05/11/19  Yes Saverio Danker, PA-C  aspirin 81 MG tablet Take 81 mg by mouth daily.   Yes [provider]  atorvastatin (LIPITOR) 10 MG tablet Take 1 tablet (10 mg total) by mouth daily. 05/16/19  Yes [name], [name], NP  Calcium Carbonate-Vitamin D (CALCIUM 600+D) 600-200 MG-UNIT TABS Take 1 tablet by mouth 2 (two) times daily.   Yes [provider]  cholecalciferol (VITAMIN D) 1000 UNITS tablet Take 1,000 Units by mouth daily.   Yes [provider]  docusate sodium (COLACE) 100 MG capsule Take 1 capsule (100 mg total) by mouth daily. 06/02/17  Yes Blanchie Serve, MD  levothyroxine (SYNTHROID) 75 MCG tablet TAKE 1 TABLET DAILY 30 MINUTES BEFORE BREAKFAST FOR THYROID Patient taking differently: Take 75 mcg by mouth daily before breakfast.  05/16/19  Yes [name], [name], NP  Multiple Vitamin (MULTIVITAMIN WITH MINERALS) TABS Take 1 tablet by mouth daily.   Yes [provider]  Omega-3 Fatty Acids (FISH OIL) 1200 MG CAPS Take 1 capsule by mouth daily.   Yes [provider]  traMADol (ULTRAM) 50 MG tablet Take 1 tablet (50 mg total) by mouth every 6 (six) hours as needed for moderate pain. Patient not taking: Reported on 07/27/2019 05/16/19   [name], [name], NP    Allergies    Mushroom extract complex and Penicillins  Review of Systems   Review of Systems  Constitutional: Negative.   HENT: Negative.   Respiratory: Negative.   Cardiovascular: Negative.   Gastrointestinal: Negative.   Genitourinary: Negative.   Musculoskeletal: Negative.   Skin: Positive for wound.  Neurological: Negative.   All other systems reviewed and are negative.   Physical Exam Updated Vital  Signs BP 126/63   Pulse 71   Temp 98 F (36.7 C) (Oral)   Resp 12   SpO2 100%   Physical Exam Vitals and nursing note reviewed.  Constitutional:      General: She is not in acute distress.    Appearance: She is well-developed. She is not ill-appearing, toxic-appearing or diaphoretic.  HENT:     Head: Normocephalic. No raccoon eyes, Battle's sign, right periorbital erythema or left periorbital erythema.     Jaw: There is normal jaw occlusion.      Comments: No drooling, dysphagia or trismus.  Able to open and close jaw without difficulty.  Matting, dried blood to posterior occiput.  Unable to visualize for laceration due to matting. Will have nursing clean to better visualized for laceration. No pulsatile bleeding.    Right Ear: No hemotympanum.     Left Ear: No hemotympanum.     Nose: Nose normal.     Comments: No facial crepitus, step-offs or lacerations    Mouth/Throat:  Lips: Pink.     Mouth: Mucous membranes are moist.     Pharynx: Oropharynx is clear. Uvula midline.     Comments: Tongue midline.  No evidence of intraoral trauma Eyes:     General: Lids are everted, no foreign bodies appreciated.     Extraocular Movements: Extraocular movements intact.     Conjunctiva/sclera: Conjunctivae normal.     Pupils: Pupils are equal, round, and reactive to light.     Visual Fields: Right eye visual fields normal and left eye visual fields normal.     Comments: No nystagmus, PERRLA  Neck:     Trachea: Trachea and phonation normal.     Comments: No neck stiffness or neck rigidity.  No meningismus Cardiovascular:     Rate and Rhythm: Normal rate.     Pulses: Normal pulses.          Radial pulses are 2+ on the right side and 2+ on the left side.       Dorsalis pedis pulses are 2+ on the right side and 2+ on the left side.       Posterior tibial pulses are 2+ on the right side and 2+ on the left side.     Heart sounds: Murmur heard.   Pulmonary:     Effort: Pulmonary effort is  normal. No respiratory distress.     Breath sounds: Normal breath sounds and air entry.     Comments: Speaks in full sentences without difficulty Chest:     Comments: No chest wall crepitus, step-offs.  Equal rise and fall of chest Abdominal:     General: Bowel sounds are normal. There is no distension.     Palpations: Abdomen is soft.     Tenderness: There is no abdominal tenderness. There is no guarding or rebound.     Comments: Soft, nontender  Musculoskeletal:        General: Normal range of motion.     Cervical back: Full passive range of motion without pain and normal range of motion.     Comments: No midline spinal tenderness, crepitus or step-offs.  Moves all 4 extremities without difficulty.  No shortening or rotation of legs.  Able to straight leg raise bilateral without difficulty.  Flexes and extends all major joints without difficulty.  Wiggles toes  Skin:    General: Skin is warm and dry.     Capillary Refill: Capillary refill takes less than 2 seconds.  Neurological:     General: No focal deficit present.     Mental Status: She is alert.     Cranial Nerves: Cranial nerves are intact.     Sensory: Sensation is intact.     Motor: Motor function is intact.     Coordination: Coordination is intact.     Gait: Gait is intact.     Comments: Cranial nerves II through XII grossly intact Facet 5 strength bilateral upper and lower extremities Intact sensation Negative Romberg, heel-to-shin, finger-nose     ED Results / Procedures / Treatments   Labs (all labs ordered are listed, but only abnormal results are displayed) Labs Reviewed  CBC WITH DIFFERENTIAL/PLATELET - Abnormal; Notable for the following components:      Result Value   Hemoglobin 11.9 (*)    All other components within normal limits  BASIC METABOLIC PANEL - Abnormal; Notable for the following components:   Creatinine, Ser 1.14 (*)    GFR calc non Af Amer 44 (*)    GFR calc  Af Amer 50 (*)    All other  components within normal limits    EKG EKG Interpretation  Date/Time:  Wednesday July 27 2019 12:32:33 EDT Ventricular Rate:  68 PR Interval:    QRS Duration: 89 QT Interval:  393 QTC Calculation: 418 R Axis:   64 Text Interpretation: Sinus rhythm No significant change since last tracing Confirmed by Dorie Rank [phone]) on 07/27/2019 12:50:07 PM   Radiology DG Chest 2 View  Result Date: 07/27/2019 CLINICAL DATA:  Fall. EXAM: CHEST - 2 VIEW COMPARISON:  Chest x-ray dated May 10, 2019. FINDINGS: Unchanged mild cardiomegaly. Atherosclerotic calcification of the aortic arch. Normal pulmonary vascularity. No focal consolidation, pleural effusion, or pneumothorax. Unchanged small calcified granuloma in the right upper lobe. No acute osseous abnormality. Chronic T10 compression deformity. IMPRESSION: No active cardiopulmonary disease. Electronically Signed   By: Titus Dubin M.D.   On: 07/27/2019 14:36   CT Head Wo Contrast  Result Date: 07/27/2019 CLINICAL DATA:  Posttraumatic headache. Syncope with a fall. Laceration and soft tissue contusion of the posterior aspect of the scalp. EXAM: CT HEAD WITHOUT CONTRAST CT CERVICAL SPINE WITHOUT CONTRAST TECHNIQUE: Multidetector CT imaging of the head and cervical spine was performed following the standard protocol without intravenous contrast. Multiplanar CT image reconstructions of the cervical spine were also generated. COMPARISON:  None. FINDINGS: CT HEAD FINDINGS Brain: No evidence of acute infarction, hemorrhage, hydrocephalus, extra-axial collection or mass lesion/mass effect. Slight atrophy of the temporal lobes. Vascular: No hyperdense vessel or unexpected calcification. Skull: Normal. Negative for fracture or focal lesion. Sinuses/Orbits: Normal. Other: There is a midline posterior scalp laceration. No appreciable scalp hematoma. CT CERVICAL SPINE FINDINGS Alignment: Normal. Skull base and vertebrae: No acute fracture. No primary bone lesion or  focal pathologic process. Soft tissues and spinal canal: No prevertebral fluid or swelling. No visible canal hematoma. Disc levels: There is no disc bulging or protrusion of significance in the cervical spine. There is slight disc space narrowing at C5-6 and C6-7. Moderate left facet arthritis at C2-3 and at C4-5 and C5-6. No significant foraminal stenoses. No spinal stenosis. Upper chest: Negative. Other: None IMPRESSION: 1. Midline posterior scalp laceration. No acute intracranial abnormality. Slight atrophy of the temporal lobes. 2. No significant abnormality of the cervical spine. Electronically Signed   By: Lorriane Shire M.D.   On: 07/27/2019 15:10   CT Cervical Spine Wo Contrast  Result Date: 07/27/2019 CLINICAL DATA:  Posttraumatic headache. Syncope with a fall. Laceration and soft tissue contusion of the posterior aspect of the scalp. EXAM: CT HEAD WITHOUT CONTRAST CT CERVICAL SPINE WITHOUT CONTRAST TECHNIQUE: Multidetector CT imaging of the head and cervical spine was performed following the standard protocol without intravenous contrast. Multiplanar CT image reconstructions of the cervical spine were also generated. COMPARISON:  None. FINDINGS: CT HEAD FINDINGS Brain: No evidence of acute infarction, hemorrhage, hydrocephalus, extra-axial collection or mass lesion/mass effect. Slight atrophy of the temporal lobes. Vascular: No hyperdense vessel or unexpected calcification. Skull: Normal. Negative for fracture or focal lesion. Sinuses/Orbits: Normal. Other: There is a midline posterior scalp laceration. No appreciable scalp hematoma. CT CERVICAL SPINE FINDINGS Alignment: Normal. Skull base and vertebrae: No acute fracture. No primary bone lesion or focal pathologic process. Soft tissues and spinal canal: No prevertebral fluid or swelling. No visible canal hematoma. Disc levels: There is no disc bulging or protrusion of significance in the cervical spine. There is slight disc space narrowing at C5-6 and  C6-7. Moderate left facet arthritis at C2-3  and at C4-5 and C5-6. No significant foraminal stenoses. No spinal stenosis. Upper chest: Negative. Other: None IMPRESSION: 1. Midline posterior scalp laceration. No acute intracranial abnormality. Slight atrophy of the temporal lobes. 2. No significant abnormality of the cervical spine. Electronically Signed   By: Lorriane Shire M.D.   On: 07/27/2019 15:10   DG Femur Min 2 Views Right  Result Date: 07/27/2019 CLINICAL DATA:  Fall. EXAM: RIGHT FEMUR 2 VIEWS COMPARISON:  Right knee x-rays dated August 26, 2016. FINDINGS: There is no evidence of fracture or other focal bone lesions. Mild right hip osteoarthritis. Soft tissues are unremarkable. IMPRESSION: 1. No acute osseous abnormality. Electronically Signed   By: Titus Dubin M.D.   On: 07/27/2019 14:41   DG Hips Bilat W or Wo Pelvis 5 Views  Result Date: 07/27/2019 CLINICAL DATA:  Fall. EXAM: DG HIP (WITH OR WITHOUT PELVIS) 5+V BILAT COMPARISON:  CT abdomen pelvis dated May 10, 2019. FINDINGS: No acute fracture or dislocation. Mild bilateral hip osteoarthritis. The pubic symphysis and sacroiliac joints are intact. Prior hernia repair. IMPRESSION: 1. No acute osseous abnormality. Electronically Signed   By: Titus Dubin M.D.   On: 07/27/2019 14:39     ECHO 04/12/19  EF 60-65%   Procedures .Marland KitchenLaceration Repair  Date/Time: 07/27/2019 4:16 PM Performed by: Nettie Elm, PA-C Authorized by: Nettie Elm, PA-C   Consent:    Consent obtained:  Verbal   Consent given by:  Patient   Risks discussed:  Infection, need for additional repair, pain, poor cosmetic result and poor wound healing   Alternatives discussed:  No treatment and delayed treatment Universal protocol:    Procedure explained and questions answered to patient or proxy's satisfaction: yes     Relevant documents present and verified: yes     Test results available and properly labeled: yes     Imaging studies available:  yes     Required blood products, implants, devices, and special equipment available: yes     Site/side marked: yes     Immediately prior to procedure, a time out was called: yes     Patient identity confirmed:  Verbally with patient Anesthesia (see MAR for exact dosages):    Anesthesia method:  Local infiltration and topical application   Topical anesthetic:  LET Laceration details:    Location:  Scalp   Scalp location:  Occipital   Length (cm):  1.2   Depth (mm):  3 Repair type:    Repair type:  Intermediate Pre-procedure details:    Preparation:  Patient was prepped and draped in usual sterile fashion and imaging obtained to evaluate for foreign bodies Exploration:    Hemostasis achieved with:  Direct pressure   Wound exploration: wound explored through full range of motion and entire depth of wound probed and visualized     Contaminated: no   Treatment:    Amount of cleaning:  Extensive   Irrigation solution:  Sterile saline   Irrigation method:  Pressure wash Skin repair:    Repair method:  Staples   Number of staples:  2 Approximation:    Approximation:  Close Post-procedure details:    Dressing:  Open (no dressing)   Patient tolerance of procedure:  Tolerated well, no immediate complications   (including critical care time)  Medications Ordered in ED Medications  hydrogen peroxide 3 % external solution (has no administration in time range)    ED Course  I have reviewed the triage vital signs and the nursing notes.  Pertinent labs & imaging results that were available during my care of the patient were reviewed by me and considered in my medical decision making (see chart for details).  63 old female presents for evaluation what sounds like a mechanical fall.  Was sitting in her recliner, went to stand up, felt like her legs got tangled into each other and she subsequently fell backwards hitting posterior occiput on a table.  Patient denies any syncope, preceding  headache, lightheadedness, dizziness, chest pain, shortness of breath or paresthesias.  Has not tried to ambulate since the incident.  Patient with nonfocal neuro exam without deficits.  No shortening or rotation of legs.  Does have hematoma to posterior occipital area however there is moderate matting of blood.  We will have nursing staff clean this off to get a better look process for lacerations.  Fall was unwitnessed.  Plan on some basic labs, imaging and reassess.  Labs and imaging personally reviewed and interpreted: CBC without leukocytosis Metabolic panel with creatinine 1.14 which is Consistent to prior labs EKG without STEMI CT head with head lacerations however no acute intracranial abnormality CT cervical spine without acute findings Plain film femur without acute fracture Film Bilateral hips with pelvis without acute fracture dislocation Chest x-ray without acute findings  Patient reassessed.  Continues to deny any complaints.  Her laceration was stapled with 2 staples.  We will ambulate her and likely DC home.  Patient able to ambulate in halls without difficulty.  She is tolerating p.o. intake without difficulty.  Requesting DC home.  Did attempt to contact her daughter Tyianna Menefee x2 to let her know we are discharging patient back to facility.  Number just to rang.  Unable to leave voicemail.  Plan to DC back to nursing facility.  The patient has been appropriately medically screened and/or stabilized in the ED. I have low suspicion for any other emergent medical condition which would require further screening, evaluation or treatment in the ED or require inpatient management.  Patient is hemodynamically stable and in no acute distress.  Patient able to ambulate in department prior to ED.  Evaluation does not show acute pathology that would require ongoing or additional emergent interventions while in the emergency department or further inpatient treatment.  I have discussed the  diagnosis with the patient and answered all questions.  Pain is been managed while in the emergency department and patient has no further complaints prior to discharge.  Patient is comfortable with plan discussed in room and is stable for discharge at this time.  I have discussed strict return precautions for returning to the emergency department.  Patient was encouraged to follow-up with PCP/specialist refer to at discharge.  Patient seen eval by attending, Dr. Tomi Bamberger who agrees with treatment, plan and disposition.   MDM Rules/Calculators/A&P                           Final Clinical Impression(s) / ED Diagnoses Final diagnoses:  Fall, initial encounter  Laceration of scalp, initial encounter    Rx / DC Orders ED Discharge Orders    None       Airanna Partin A, PA-C 07/27/19 1646    Dorie Rank, MD 07/29/19 [phone]

## 2019-07-27 NOTE — ED Notes (Signed)
Placed on purewick

## 2019-07-27 NOTE — ED Notes (Signed)
[facility] called  

## 2019-07-27 NOTE — Discharge Instructions (Addendum)
You will need your staples removed in approximately 1 week.  May take Tylenol as needed for pain  You may use hydroperoxide to get blood out of your hair  Return for any worsening symptoms.

## 2019-07-27 NOTE — ED Triage Notes (Signed)
Pt arrived via [facility] from home CC unwitnessed syncopal fall from standing position out of chair. Pt denies LOC neck or back pain, denies blood thinners. Per EMS no unilateral deficients, A/OX4, ambulatory at baseline, bump on back of head bleeding controlled.    Hx Chronic back pain and constipation, HTN, hypothyroidism, Right toe amputation

## 2019-07-28 ENCOUNTER — Telehealth: Payer: Self-pay

## 2019-07-28 NOTE — Telephone Encounter (Signed)
Patient called to ask if you would be able to remove staples from her head next week as she was told at the hospital when they put them in to have them removed in one week which is next Friday I told her that your schedule is booked for next week but I told her that I would ask you what you would have to do I can schedule her with [name] next Thursday as she has openings Please Advise

## 2019-07-28 NOTE — Telephone Encounter (Signed)
Yes Manxi can do it on thurs

## 2019-08-04 ENCOUNTER — Other Ambulatory Visit: Payer: Self-pay

## 2019-08-04 ENCOUNTER — Encounter: Payer: Self-pay | Admitting: Nurse Practitioner

## 2019-08-04 ENCOUNTER — Non-Acute Institutional Stay: Payer: PPO | Admitting: Nurse Practitioner

## 2019-08-04 DIAGNOSIS — E039 Hypothyroidism, unspecified: Secondary | ICD-10-CM | POA: Diagnosis not present

## 2019-08-04 DIAGNOSIS — Z9181 History of falling: Secondary | ICD-10-CM | POA: Diagnosis not present

## 2019-08-04 DIAGNOSIS — S0101XD Laceration without foreign body of scalp, subsequent encounter: Secondary | ICD-10-CM | POA: Diagnosis not present

## 2019-08-04 NOTE — Patient Instructions (Signed)
Wait 2-3 days before shampoo hair. Encouraged the patient to ambulate with walker consistently.

## 2019-08-04 NOTE — Assessment & Plan Note (Signed)
Mechanical fall, increased frailty is contributory, safety awareness was addressed during today's visit.

## 2019-08-04 NOTE — Assessment & Plan Note (Signed)
Occipital, healed, staples removed, no s/o bleeding or infection

## 2019-08-04 NOTE — Assessment & Plan Note (Signed)
Stable, TSH 2.33 03/24/19, continue Levothyroxine.

## 2019-08-04 NOTE — Progress Notes (Signed)
Location:   clinic [place]   Place of Service:  Clinic (12) Provider: Marlana Latus NP  Code Status: DNR Goals of Care: [place] Advanced Directives 07/27/2019  Does Patient Have a Medical Advance Directive? Yes  Type of Paramedic of [place];Living will  Does patient want to make changes to medical advance directive? -  Copy of [place] in Chart? (No Data)  Pre-existing out of facility DNR order (yellow form or pink MOST form) -     Chief Complaint  Patient presents with   Acute Visit    Patient returns to the clinic for staple removal after a fall last week Wednesday.    HPI: Patient is a 86 y.o. female seen today for an acute visit for suture removal  ED eval 07/27/19 for fall backwards hitting her head on the edge of the coffee table  when she got up her reliner and started walking and lost balance, sustained scalp laceration,  Repaired with staples. CT head/cervical spine, CXR, X-ray R femur showed no acute process.Marland Kitchen   Hx of syncope,  aortic valve stenosis, hypothyroidism, takes Levothyroxine 51mcg qd, TSH 2.33 03/24/19    Past Medical History:  Diagnosis Date   Cerebral atrophy ([place]) 01/21/13   Corns and callosities    Hyperlipidemia    Hypothyroid    Osteoarthrosis, unspecified whether generalized or localized, unspecified site    Osteoporosis, unspecified    Other and unspecified hyperlipidemia    Other generalized ischemic cerebrovascular disease 01/21/13   small vessel disease   Syncope and collapse    Umbilical hernia without mention of obstruction or gangrene    Unspecified hearing loss    Unspecified hypothyroidism    Unspecified vitamin D deficiency    Ventral hernia, unspecified, without mention of obstruction or gangrene     Past Surgical History:  Procedure Laterality Date   CATARACT EXTRACTION W/ INTRAOCULAR LENS  IMPLANT, BILATERAL  2012   Dr. Bing Plume   HERNIA REPAIR  01/2008   laparoscopic Dr. [name]   INGUINAL HERNIA REPAIR N/A 05/10/2019   Procedure: LAPAROSCOPIC FEMORAL HERNIA REPAIR WITH MESH;  Surgeon: Ralene Ok, MD;  Location: [facility];  Service: General;  Laterality: N/A;   KNEE SURGERY  2008   Dr. Alyssa Grove  left   TOE AMPUTATION  1986   little toe right foot  Dr. Dema Severin; [place]    Allergies  Allergen Reactions   Mushroom Extract Complex Other (See Comments)    "Allergic," per paperwork from facility   Penicillins Diarrhea and Other (See Comments)    "Allergic," per paperwork from facility    Allergies as of 08/04/2019      Reactions   Mushroom Extract Complex Other (See Comments)   "Allergic," per paperwork from facility   Penicillins Diarrhea, Other (See Comments)   "Allergic," per paperwork from facility      Medication List       Accurate as of August 04, 2019  4:26 PM. If you have any questions, ask your nurse or doctor.        acetaminophen 500 MG tablet Commonly known as: TYLENOL Take 2 tablets (1,000 mg total) by mouth every 6 (six) hours as needed.   aspirin 81 MG tablet Take 81 mg by mouth daily.   atorvastatin 10 MG tablet Commonly known as: LIPITOR Take 1 tablet (10 mg total) by mouth daily.   Calcium 600+D 600-200 MG-UNIT Tabs Generic drug: Calcium Carbonate-Vitamin D Take 1 tablet by  mouth 2 (two) times daily.   cholecalciferol 1000 units tablet Commonly known as: VITAMIN D Take 1,000 Units by mouth daily.   docusate sodium 100 MG capsule Commonly known as: Colace Take 1 capsule (100 mg total) by mouth daily.   Fish Oil 1200 MG Caps Take 1 capsule by mouth daily.   levothyroxine 75 MCG tablet Commonly known as: SYNTHROID TAKE 1 TABLET DAILY 30 MINUTES BEFORE BREAKFAST FOR THYROID What changed:   how much to take  how to take this  when to take this  additional instructions   multivitamin with minerals Tabs tablet Take 1 tablet by mouth daily.   traMADol 50 MG tablet Commonly known as: ULTRAM Take 1 tablet  (50 mg total) by mouth every 6 (six) hours as needed for moderate pain.       Review of Systems:  Review of Systems  Constitutional: Negative for appetite change, fatigue and fever.       #4Ibs in the past 6 months.   HENT: Positive for hearing loss. Negative for congestion and voice change.   Respiratory: Negative for cough and shortness of breath.   Cardiovascular: Negative for chest pain, palpitations and leg swelling.  Gastrointestinal: Negative for abdominal pain, constipation, nausea and vomiting.       Had a good BM today since surgery.   Genitourinary: Positive for frequency. Negative for difficulty urinating, dysuria and urgency.       2-3x/night.   Musculoskeletal: Positive for arthralgias and gait problem.       Right femur pain after fall, X-ray negative for fx, better.   Skin: Positive for wound. Negative for color change.  Neurological: Negative for dizziness, facial asymmetry, speech difficulty, weakness, numbness and headaches.  Psychiatric/Behavioral: Negative for behavioral problems and sleep disturbance. The patient is not nervous/anxious.     Health Maintenance  Topic Date Due   INFLUENZA VACCINE  08/28/2019   TETANUS/TDAP  06/13/2027   DEXA SCAN  Completed   COVID-19 Vaccine  Completed   PNA vac Low Risk Adult  Completed    Physical Exam: Vitals:   08/04/19 1311  BP: 104/66  Pulse: 80  Temp: (!) 96.8 F (36 C)  SpO2: 96%  Weight: 118 lb (53.5 kg)  Height: 5\' 2"  (1.575 m)   Body mass index is 21.58 kg/m. Physical Exam Constitutional:      Appearance: Normal appearance.  HENT:     Head: Normocephalic and atraumatic.     Mouth/Throat:     Mouth: Mucous membranes are moist.  Eyes:     Extraocular Movements: Extraocular movements intact.     Conjunctiva/sclera: Conjunctivae normal.     Pupils: Pupils are equal, round, and reactive to light.  Cardiovascular:     Rate and Rhythm: Normal rate and regular rhythm.     Heart sounds: Murmur  heard.   Pulmonary:     Effort: Pulmonary effort is normal.     Breath sounds: Normal breath sounds. No rales.  Abdominal:     General: Bowel sounds are normal.     Palpations: Abdomen is soft.     Tenderness: There is no abdominal tenderness.  Musculoskeletal:        General: No tenderness.     Cervical back: Normal range of motion and neck supple. No rigidity or tenderness.     Right lower leg: No edema.     Left lower leg: No edema.     Comments: Unsteady gait.   Skin:    General:  Skin is warm and dry.     Comments: Knee scars from previous arthroscopic. Occipital scalp laceration is healed, staples removed with no bleeding.   Neurological:     General: No focal deficit present.     Mental Status: She is alert and oriented to person, place, and time. Mental status is at baseline.     Gait: Gait abnormal.  Psychiatric:        Mood and Affect: Mood normal.        Behavior: Behavior normal.        Thought Content: Thought content normal.        Judgment: Judgment normal.     Labs reviewed: Basic Metabolic Panel: Recent Labs    09/28/18 0800 09/28/18 0800 03/24/19 0700 03/24/19 0700 05/09/19 2345 05/09/19 2345 05/10/19 1041 05/11/19 0447 07/27/19 1514  NA 143   < > 143   < > 140   < > 139 140 142  K 4.4   < > 4.4   < > 4.2   < > 3.7 3.9 4.1  CL 109   < > 107   < > 103  --   --  109 105  CO2 27   < > 27   < > 27  --   --  22 28  GLUCOSE 95   < > 104*   < > 132*  --   --  104* 98  BUN 21   < > 19   < > 22  --   --  12 23  CREATININE 1.01*   < > 1.06*  --  1.20*  --   --  1.04* 1.14*  CALCIUM 9.2   < > 9.4   < > 9.4  --   --  8.4* 9.0  MG  --   --   --   --   --   --   --  2.0  --   TSH 1.45  --  2.33  --   --   --   --   --   --    < > = values in this interval not displayed.   Liver Function Tests: Recent Labs    03/24/19 0700 05/09/19 2345 05/11/19 0447  AST 22 28 23   ALT 22 30 19   ALKPHOS  --  53 41  BILITOT 0.4 0.7 0.6  PROT 6.7 7.0 5.5*  ALBUMIN  --   3.6 2.8*   Recent Labs    05/09/19 2345  LIPASE 33   No results for input(s): AMMONIA in the last 8760 hours. CBC: Recent Labs    05/09/19 2345 05/09/19 2345 05/10/19 1041 05/11/19 0447 07/27/19 1514  WBC 9.2  --   --  8.0 7.5  NEUTROABS 7.9*  --   --  6.0 4.5  HGB 12.1   < > 11.6* 10.1* 11.9*  HCT 36.9   < > 34.0* 31.4* 36.3  MCV 92.3  --   --  93.2 91.4  PLT 281  --   --  238 302   < > = values in this interval not displayed.   Lipid Panel: Recent Labs    09/28/18 0800 03/24/19 0700  CHOL 201* 140  HDL 59 59  LDLCALC 118* 64  TRIG 127 85  CHOLHDL 3.4 2.4   Lab Results  Component Value Date   HGBA1C 5.6 09/28/2018    Procedures since last visit: DG Chest 2 View  Result Date: 07/27/2019 CLINICAL DATA:  Fall. EXAM: CHEST - 2 VIEW COMPARISON:  Chest x-ray dated May 10, 2019. FINDINGS: Unchanged mild cardiomegaly. Atherosclerotic calcification of the aortic arch. Normal pulmonary vascularity. No focal consolidation, pleural effusion, or pneumothorax. Unchanged small calcified granuloma in the right upper lobe. No acute osseous abnormality. Chronic T10 compression deformity. IMPRESSION: No active cardiopulmonary disease. Electronically Signed   By: Titus Dubin M.D.   On: 07/27/2019 14:36   CT Head Wo Contrast  Result Date: 07/27/2019 CLINICAL DATA:  Posttraumatic headache. Syncope with a fall. Laceration and soft tissue contusion of the posterior aspect of the scalp. EXAM: CT HEAD WITHOUT CONTRAST CT CERVICAL SPINE WITHOUT CONTRAST TECHNIQUE: Multidetector CT imaging of the head and cervical spine was performed following the standard protocol without intravenous contrast. Multiplanar CT image reconstructions of the cervical spine were also generated. COMPARISON:  None. FINDINGS: CT HEAD FINDINGS Brain: No evidence of acute infarction, hemorrhage, hydrocephalus, extra-axial collection or mass lesion/mass effect. Slight atrophy of the temporal lobes. Vascular: No  hyperdense vessel or unexpected calcification. Skull: Normal. Negative for fracture or focal lesion. Sinuses/Orbits: Normal. Other: There is a midline posterior scalp laceration. No appreciable scalp hematoma. CT CERVICAL SPINE FINDINGS Alignment: Normal. Skull base and vertebrae: No acute fracture. No primary bone lesion or focal pathologic process. Soft tissues and spinal canal: No prevertebral fluid or swelling. No visible canal hematoma. Disc levels: There is no disc bulging or protrusion of significance in the cervical spine. There is slight disc space narrowing at C5-6 and C6-7. Moderate left facet arthritis at C2-3 and at C4-5 and C5-6. No significant foraminal stenoses. No spinal stenosis. Upper chest: Negative. Other: None IMPRESSION: 1. Midline posterior scalp laceration. No acute intracranial abnormality. Slight atrophy of the temporal lobes. 2. No significant abnormality of the cervical spine. Electronically Signed   By: Lorriane Shire M.D.   On: 07/27/2019 15:10   CT Cervical Spine Wo Contrast  Result Date: 07/27/2019 CLINICAL DATA:  Posttraumatic headache. Syncope with a fall. Laceration and soft tissue contusion of the posterior aspect of the scalp. EXAM: CT HEAD WITHOUT CONTRAST CT CERVICAL SPINE WITHOUT CONTRAST TECHNIQUE: Multidetector CT imaging of the head and cervical spine was performed following the standard protocol without intravenous contrast. Multiplanar CT image reconstructions of the cervical spine were also generated. COMPARISON:  None. FINDINGS: CT HEAD FINDINGS Brain: No evidence of acute infarction, hemorrhage, hydrocephalus, extra-axial collection or mass lesion/mass effect. Slight atrophy of the temporal lobes. Vascular: No hyperdense vessel or unexpected calcification. Skull: Normal. Negative for fracture or focal lesion. Sinuses/Orbits: Normal. Other: There is a midline posterior scalp laceration. No appreciable scalp hematoma. CT CERVICAL SPINE FINDINGS Alignment: Normal.  Skull base and vertebrae: No acute fracture. No primary bone lesion or focal pathologic process. Soft tissues and spinal canal: No prevertebral fluid or swelling. No visible canal hematoma. Disc levels: There is no disc bulging or protrusion of significance in the cervical spine. There is slight disc space narrowing at C5-6 and C6-7. Moderate left facet arthritis at C2-3 and at C4-5 and C5-6. No significant foraminal stenoses. No spinal stenosis. Upper chest: Negative. Other: None IMPRESSION: 1. Midline posterior scalp laceration. No acute intracranial abnormality. Slight atrophy of the temporal lobes. 2. No significant abnormality of the cervical spine. Electronically Signed   By: Lorriane Shire M.D.   On: 07/27/2019 15:10   DG Femur Min 2 Views Right  Result Date: 07/27/2019 CLINICAL DATA:  Fall. EXAM: RIGHT FEMUR 2 VIEWS COMPARISON:  Right knee x-rays dated August 26, 2016. FINDINGS:  There is no evidence of fracture or other focal bone lesions. Mild right hip osteoarthritis. Soft tissues are unremarkable. IMPRESSION: 1. No acute osseous abnormality. Electronically Signed   By: Titus Dubin M.D.   On: 07/27/2019 14:41   DG Hips Bilat W or Wo Pelvis 5 Views  Result Date: 07/27/2019 CLINICAL DATA:  Fall. EXAM: DG HIP (WITH OR WITHOUT PELVIS) 5+V BILAT COMPARISON:  CT abdomen pelvis dated May 10, 2019. FINDINGS: No acute fracture or dislocation. Mild bilateral hip osteoarthritis. The pubic symphysis and sacroiliac joints are intact. Prior hernia repair. IMPRESSION: 1. No acute osseous abnormality. Electronically Signed   By: Titus Dubin M.D.   On: 07/27/2019 14:39    Assessment/Plan Scalp laceration, subsequent encounter Occipital, healed, staples removed, no s/o bleeding or infection   Hypothyroidism Stable, TSH 2.33 03/24/19, continue Levothyroxine.   Personal history of fall Mechanical fall, increased frailty is contributory, safety awareness was addressed during today's visit.      Labs/tests ordered:  None  Next appt:  08/16/2019

## 2019-08-16 ENCOUNTER — Other Ambulatory Visit: Payer: Self-pay

## 2019-08-16 DIAGNOSIS — R7303 Prediabetes: Secondary | ICD-10-CM

## 2019-08-16 DIAGNOSIS — M8580 Other specified disorders of bone density and structure, unspecified site: Secondary | ICD-10-CM | POA: Diagnosis not present

## 2019-08-16 DIAGNOSIS — E538 Deficiency of other specified B group vitamins: Secondary | ICD-10-CM

## 2019-08-16 DIAGNOSIS — N1831 Chronic kidney disease, stage 3a: Secondary | ICD-10-CM

## 2019-08-17 DIAGNOSIS — Z961 Presence of intraocular lens: Secondary | ICD-10-CM | POA: Diagnosis not present

## 2019-08-17 DIAGNOSIS — H11042 Peripheral pterygium, stationary, left eye: Secondary | ICD-10-CM | POA: Diagnosis not present

## 2019-08-17 DIAGNOSIS — H35373 Puckering of macula, bilateral: Secondary | ICD-10-CM | POA: Diagnosis not present

## 2019-08-17 DIAGNOSIS — H04123 Dry eye syndrome of bilateral lacrimal glands: Secondary | ICD-10-CM | POA: Diagnosis not present

## 2019-08-17 LAB — CBC WITH DIFFERENTIAL/PLATELET
Absolute Monocytes: 319 cells/uL (ref 200–950)
Basophils Absolute: 17 cells/uL (ref 0–200)
Basophils Relative: 0.3 %
Eosinophils Absolute: 209 cells/uL (ref 15–500)
Eosinophils Relative: 3.6 %
HCT: 36.9 % (ref 35.0–45.0)
Hemoglobin: 12.2 g/dL (ref 11.7–15.5)
Lymphs Abs: 2216 cells/uL (ref 850–3900)
MCH: 30.1 pg (ref 27.0–33.0)
MCHC: 33.1 g/dL (ref 32.0–36.0)
MCV: 91.1 fL (ref 80.0–100.0)
MPV: 10.3 fL (ref 7.5–12.5)
Monocytes Relative: 5.5 %
Neutro Abs: 3039 cells/uL (ref 1500–7800)
Neutrophils Relative %: 52.4 %
Platelets: 280 10*3/uL (ref 140–400)
RBC: 4.05 10*6/uL (ref 3.80–5.10)
RDW: 12.7 % (ref 11.0–15.0)
Total Lymphocyte: 38.2 %
WBC: 5.8 10*3/uL (ref 3.8–10.8)

## 2019-08-17 LAB — VITAMIN B12: Vitamin B-12: 673 pg/mL (ref 200–1100)

## 2019-08-17 LAB — COMPLETE METABOLIC PANEL WITH GFR
AG Ratio: 1.6 (calc) (ref 1.0–2.5)
ALT: 18 U/L (ref 6–29)
AST: 21 U/L (ref 10–35)
Albumin: 3.9 g/dL (ref 3.6–5.1)
Alkaline phosphatase (APISO): 50 U/L (ref 37–153)
BUN/Creatinine Ratio: 18 (calc) (ref 6–22)
BUN: 20 mg/dL (ref 7–25)
CO2: 26 mmol/L (ref 20–32)
Calcium: 9.3 mg/dL (ref 8.6–10.4)
Chloride: 107 mmol/L (ref 98–110)
Creat: 1.09 mg/dL — ABNORMAL HIGH (ref 0.60–0.88)
GFR, Est African American: 53 mL/min/{1.73_m2} — ABNORMAL LOW (ref >=60)
GFR, Est Non African American: 46 mL/min/{1.73_m2} — ABNORMAL LOW (ref >=60)
Globulin: 2.5 g/dL (calc) (ref 1.9–3.7)
Glucose, Bld: 100 mg/dL — ABNORMAL HIGH (ref 65–99)
Potassium: 4.4 mmol/L (ref 3.5–5.3)
Sodium: 142 mmol/L (ref 135–146)
Total Bilirubin: 0.4 mg/dL (ref 0.2–1.2)
Total Protein: 6.4 g/dL (ref 6.1–8.1)

## 2019-08-17 LAB — HEMOGLOBIN A1C
Hgb A1c MFr Bld: 5.6 % of total Hgb (ref <5.7)
Mean Plasma Glucose: 114 (calc)
eAG (mmol/L): 6.3 (calc)

## 2019-08-17 LAB — VITAMIN D 25 HYDROXY (VIT D DEFICIENCY, FRACTURES): Vit D, 25-Hydroxy: 45 ng/mL (ref 30–100)

## 2019-08-19 ENCOUNTER — Encounter: Payer: Self-pay | Admitting: Internal Medicine

## 2019-08-25 ENCOUNTER — Encounter: Payer: Self-pay | Admitting: Nurse Practitioner

## 2019-09-02 ENCOUNTER — Non-Acute Institutional Stay: Payer: PPO | Admitting: Internal Medicine

## 2019-09-02 ENCOUNTER — Other Ambulatory Visit: Payer: Self-pay

## 2019-09-02 ENCOUNTER — Encounter: Payer: Self-pay | Admitting: Internal Medicine

## 2019-09-02 VITALS — BP 134/70 | HR 72 | Temp 96.8°F | Ht 62.0 in | Wt 119.6 lb

## 2019-09-02 DIAGNOSIS — M79604 Pain in right leg: Secondary | ICD-10-CM

## 2019-09-02 DIAGNOSIS — I350 Nonrheumatic aortic (valve) stenosis: Secondary | ICD-10-CM | POA: Diagnosis not present

## 2019-09-02 DIAGNOSIS — Z1231 Encounter for screening mammogram for malignant neoplasm of breast: Secondary | ICD-10-CM

## 2019-09-02 DIAGNOSIS — E039 Hypothyroidism, unspecified: Secondary | ICD-10-CM | POA: Diagnosis not present

## 2019-09-02 DIAGNOSIS — M8580 Other specified disorders of bone density and structure, unspecified site: Secondary | ICD-10-CM

## 2019-09-02 DIAGNOSIS — R7303 Prediabetes: Secondary | ICD-10-CM

## 2019-09-02 DIAGNOSIS — E538 Deficiency of other specified B group vitamins: Secondary | ICD-10-CM | POA: Diagnosis not present

## 2019-09-07 NOTE — Progress Notes (Signed)
Location: [place] of Service:  Clinic (12)  Provider:   Code Status:  Goals of Care:  Advanced Directives 07/27/2019  Does Patient Have a Medical Advance Directive? Yes  Type of Paramedic of [place];Living will  Does patient want to make changes to medical advance directive? -  Copy of [place] in Chart? (No Data)  Pre-existing out of facility DNR order (yellow form or pink MOST form) -     Chief Complaint  Patient presents with   Medical Management of Chronic Issues    Patient returns to the clinic for follow up.     HPI: Patient is a 86 y.o. female seen today for Follow up  Patient has a history of hypothyroidism, B12 deficiency, hyperlipidemia, CKD stage III, osteopenia and aortic stenosis and arthritis And recent history of  SBO due to Left Incarcerated Femoral Hernia.  Requiring repair with mesh. Patient also had a mechanical fall 4 weeks ago sustaining laceration needing staples.  Patient is doing well.  Denies any dizziness.  No falls since then. But patient did state that before her fall her right leg gave away.  And since then she feels little weaker in her right leg.  And has pain in that leg She wanted to know today if she can get screening mammogram when she got letter from [facility]. Also wanted to know if she can have cervical cancer screening. Patient sister recently was diagnosed with melanoma.  Wants to know if she can see a dermatology for screening.  Patient does not have any history of skin cancer Otherwise doing well does have some pain in her left Thumb Previous x-ray just showed arthritis.  Past Medical History:  Diagnosis Date   Cerebral atrophy ([place]) 01/21/13   Corns and callosities    Hyperlipidemia    Hypothyroid    Osteoarthrosis, unspecified whether generalized or localized, unspecified site    Osteoporosis, unspecified    Other and unspecified hyperlipidemia     Other generalized ischemic cerebrovascular disease 01/21/13   small vessel disease   Syncope and collapse    Umbilical hernia without mention of obstruction or gangrene    Unspecified hearing loss    Unspecified hypothyroidism    Unspecified vitamin D deficiency    Ventral hernia, unspecified, without mention of obstruction or gangrene     Past Surgical History:  Procedure Laterality Date   CATARACT EXTRACTION W/ INTRAOCULAR LENS  IMPLANT, BILATERAL  2012   Dr. Bing Plume   HERNIA REPAIR  01/2008   laparoscopic Dr. [name]   INGUINAL HERNIA REPAIR N/A 05/10/2019   Procedure: LAPAROSCOPIC FEMORAL HERNIA REPAIR WITH MESH;  Surgeon: Ralene Ok, MD;  Location: [facility];  Service: General;  Laterality: N/A;   KNEE SURGERY  2008   Dr. Alyssa Grove  left   TOE AMPUTATION  1986   little toe right foot  Dr. Dema Severin; [place]    Allergies  Allergen Reactions   Mushroom Extract Complex Other (See Comments)    "Allergic," per paperwork from facility   Penicillins Diarrhea and Other (See Comments)    "Allergic," per paperwork from facility    Outpatient Encounter Medications as of 09/02/2019  Medication Sig   acetaminophen (TYLENOL) 500 MG tablet Take 2 tablets (1,000 mg total) by mouth every 6 (six) hours as needed.   aspirin 81 MG tablet Take 81 mg by mouth daily.   atorvastatin (LIPITOR) 10 MG tablet Take 1 tablet (10 mg  total) by mouth daily.   Calcium Carbonate-Vitamin D (CALCIUM 600+D) 600-200 MG-UNIT TABS Take 1 tablet by mouth 2 (two) times daily.   docusate sodium (COLACE) 100 MG capsule Take 1 capsule (100 mg total) by mouth daily.   levothyroxine (SYNTHROID) 75 MCG tablet TAKE 1 TABLET DAILY 30 MINUTES BEFORE BREAKFAST FOR THYROID (Patient taking differently: Take 75 mcg by mouth daily before breakfast. )   Multiple Vitamin (MULTIVITAMIN WITH MINERALS) TABS Take 1 tablet by mouth daily.   Omega-3 Fatty Acids (FISH OIL) 1200 MG CAPS Take 1 capsule by mouth daily.     cholecalciferol (VITAMIN D) 1000 UNITS tablet Take 1,000 Units by mouth daily. (Patient not taking: Reported on 09/02/2019)   traMADol (ULTRAM) 50 MG tablet Take 1 tablet (50 mg total) by mouth every 6 (six) hours as needed for moderate pain. (Patient not taking: Reported on 09/02/2019)   No facility-administered encounter medications on file as of 09/02/2019.    Review of Systems:  Review of Systems  Constitutional: Negative.   HENT: Negative.   Respiratory: Negative.   Cardiovascular: Negative.   Gastrointestinal: Negative.   Genitourinary: Negative.   Musculoskeletal: Positive for gait problem.  Neurological: Positive for weakness.  Psychiatric/Behavioral: Negative.     Health Maintenance  Topic Date Due   INFLUENZA VACCINE  08/28/2019   TETANUS/TDAP  06/13/2027   DEXA SCAN  Completed   COVID-19 Vaccine  Completed   PNA vac Low Risk Adult  Completed    Physical Exam: Vitals:   09/02/19 1210  BP: 134/70  Pulse: 72  Temp: (!) 96.8 F (36 C)  SpO2: 98%  Weight: 119 lb 9.6 oz (54.3 kg)  Height: 5\' 2"  (1.575 m)   Body mass index is 21.88 kg/m. Physical Exam Vitals reviewed.  Constitutional:      Appearance: Normal appearance.  HENT:     Head: Normocephalic.     Nose: Nose normal.     Mouth/Throat:     Mouth: Mucous membranes are moist.     Pharynx: Oropharynx is clear.  Eyes:     Pupils: Pupils are equal, round, and reactive to light.  Cardiovascular:     Rate and Rhythm: Normal rate. Rhythm irregular.     Heart sounds: Murmur heard.   Pulmonary:     Effort: Pulmonary effort is normal.     Breath sounds: Normal breath sounds.  Abdominal:     General: Abdomen is flat. Bowel sounds are normal.     Palpations: Abdomen is soft.  Musculoskeletal:        General: No swelling.     Cervical back: Neck supple.  Skin:    General: Skin is warm.  Neurological:     General: No focal deficit present.     Mental Status: She is alert and oriented to person,  place, and time.     Comments: Her gait is now more slower without  the walker I did not have any focal deficits.  Her right leg was not weak but patient complained of pain in her muscles.  Her right hip joint and knee moved without any pain or restriction  Psychiatric:        Mood and Affect: Mood normal.     Labs reviewed: Basic Metabolic Panel: Recent Labs    09/28/18 0800 09/28/18 0800 03/24/19 0700 05/09/19 2345 05/11/19 0447 07/27/19 1514 08/16/19 0705  NA 143   < > 143   < > 140 142 142  K 4.4   < >  4.4   < > 3.9 4.1 4.4  CL 109   < > 107   < > 109 105 107  CO2 27   < > 27   < > 22 28 26   GLUCOSE 95   < > 104*   < > 104* 98 100*  BUN 21   < > 19   < > 12 23 20   CREATININE 1.01*   < > 1.06*   < > 1.04* 1.14* 1.09*  CALCIUM 9.2   < > 9.4   < > 8.4* 9.0 9.3  MG  --   --   --   --  2.0  --   --   TSH 1.45  --  2.33  --   --   --   --    < > = values in this interval not displayed.   Liver Function Tests: Recent Labs    05/09/19 2345 05/11/19 0447 08/16/19 0705  AST 28 23 21   ALT 30 19 18   ALKPHOS 53 41  --   BILITOT 0.7 0.6 0.4  PROT 7.0 5.5* 6.4  ALBUMIN 3.6 2.8*  --    Recent Labs    05/09/19 2345  LIPASE 33   No results for input(s): AMMONIA in the last 8760 hours. CBC: Recent Labs    05/11/19 0447 07/27/19 1514 08/16/19 0705  WBC 8.0 7.5 5.8  NEUTROABS 6.0 4.5 3,039  HGB 10.1* 11.9* 12.2  HCT 31.4* 36.3 36.9  MCV 93.2 91.4 91.1  PLT 238 302 280   Lipid Panel: Recent Labs    09/28/18 0800 03/24/19 0700  CHOL 201* 140  HDL 59 59  LDLCALC 118* 64  TRIG 127 85  CHOLHDL 3.4 2.4   Lab Results  Component Value Date   HGBA1C 5.6 08/16/2019    Procedures since last visit: No results found.  Assessment/Plan 1. Aortic valve stenosis, etiology of cardiac valve disease unspecified Her Rhythm was irregular - EKG 12-Lead Showed Ectopic Atrial Rhythm HR 62 Will follow with Cardiology  2. Right leg pain Questionable etiology At this  time he does not seem to be related to her Arthritis Can use Tylenol as needed Also make a referral to physical therapy If she does feel weakness would consider CT scan  Acquired hypothyroidism TSH normal in 2/21 Hyperlipidemia/ LDL in good levels on statin in 2/21 Left Thumb Pain due to arthritis Use Tylenol as needed  History of B12 deficiency Levels are normal  . Osteopenia, unspecified location  -T score -2.2 in 11/2016 Will Need reordering of DEXA on Next visit  Screening Mammogram Ordered Also she will see Facility NP dermatology for Melanoma screening  Labs/tests ordered:  * No order type specified * Next appt:  Visit date not found

## 2019-09-13 DIAGNOSIS — L814 Other melanin hyperpigmentation: Secondary | ICD-10-CM | POA: Diagnosis not present

## 2019-09-13 DIAGNOSIS — L570 Actinic keratosis: Secondary | ICD-10-CM | POA: Diagnosis not present

## 2019-09-13 DIAGNOSIS — D1801 Hemangioma of skin and subcutaneous tissue: Secondary | ICD-10-CM | POA: Diagnosis not present

## 2019-09-13 DIAGNOSIS — L820 Inflamed seborrheic keratosis: Secondary | ICD-10-CM | POA: Diagnosis not present

## 2019-09-13 DIAGNOSIS — L821 Other seborrheic keratosis: Secondary | ICD-10-CM | POA: Diagnosis not present

## 2019-09-20 ENCOUNTER — Other Ambulatory Visit: Payer: Self-pay | Admitting: Internal Medicine

## 2019-09-20 ENCOUNTER — Encounter: Payer: Self-pay | Admitting: Internal Medicine

## 2019-09-20 DIAGNOSIS — M8580 Other specified disorders of bone density and structure, unspecified site: Secondary | ICD-10-CM

## 2019-09-20 NOTE — Progress Notes (Signed)
A user error has taken place.

## 2019-09-21 DIAGNOSIS — M810 Age-related osteoporosis without current pathological fracture: Secondary | ICD-10-CM | POA: Diagnosis not present

## 2019-09-21 DIAGNOSIS — R2681 Unsteadiness on feet: Secondary | ICD-10-CM | POA: Diagnosis not present

## 2019-09-21 DIAGNOSIS — M6281 Muscle weakness (generalized): Secondary | ICD-10-CM | POA: Diagnosis not present

## 2019-09-21 DIAGNOSIS — M1990 Unspecified osteoarthritis, unspecified site: Secondary | ICD-10-CM | POA: Diagnosis not present

## 2019-09-21 DIAGNOSIS — M549 Dorsalgia, unspecified: Secondary | ICD-10-CM | POA: Diagnosis not present

## 2019-09-21 DIAGNOSIS — M79604 Pain in right leg: Secondary | ICD-10-CM | POA: Diagnosis not present

## 2019-09-21 DIAGNOSIS — K5900 Constipation, unspecified: Secondary | ICD-10-CM | POA: Diagnosis not present

## 2019-09-21 DIAGNOSIS — R609 Edema, unspecified: Secondary | ICD-10-CM | POA: Diagnosis not present

## 2019-09-21 DIAGNOSIS — Z89421 Acquired absence of other right toe(s): Secondary | ICD-10-CM | POA: Diagnosis not present

## 2019-09-21 DIAGNOSIS — R011 Cardiac murmur, unspecified: Secondary | ICD-10-CM | POA: Diagnosis not present

## 2019-09-21 DIAGNOSIS — E559 Vitamin D deficiency, unspecified: Secondary | ICD-10-CM | POA: Diagnosis not present

## 2019-09-21 DIAGNOSIS — M2550 Pain in unspecified joint: Secondary | ICD-10-CM | POA: Diagnosis not present

## 2019-09-26 ENCOUNTER — Other Ambulatory Visit: Payer: Self-pay | Admitting: Internal Medicine

## 2019-09-26 DIAGNOSIS — M8580 Other specified disorders of bone density and structure, unspecified site: Secondary | ICD-10-CM

## 2019-09-28 ENCOUNTER — Other Ambulatory Visit: Payer: Self-pay

## 2019-09-28 ENCOUNTER — Ambulatory Visit
Admission: RE | Admit: 2019-09-28 | Discharge: 2019-09-28 | Disposition: A | Discharge status: Home / Self Care | Payer: PPO | Source: Ambulatory Visit | Attending: Internal Medicine | Admitting: Internal Medicine

## 2019-09-28 DIAGNOSIS — Z1231 Encounter for screening mammogram for malignant neoplasm of breast: Secondary | ICD-10-CM | POA: Diagnosis not present

## 2019-09-29 DIAGNOSIS — R609 Edema, unspecified: Secondary | ICD-10-CM | POA: Diagnosis not present

## 2019-09-29 DIAGNOSIS — M810 Age-related osteoporosis without current pathological fracture: Secondary | ICD-10-CM | POA: Diagnosis not present

## 2019-09-29 DIAGNOSIS — M79604 Pain in right leg: Secondary | ICD-10-CM | POA: Diagnosis not present

## 2019-09-29 DIAGNOSIS — Z89421 Acquired absence of other right toe(s): Secondary | ICD-10-CM | POA: Diagnosis not present

## 2019-09-29 DIAGNOSIS — E559 Vitamin D deficiency, unspecified: Secondary | ICD-10-CM | POA: Diagnosis not present

## 2019-09-29 DIAGNOSIS — R2681 Unsteadiness on feet: Secondary | ICD-10-CM | POA: Diagnosis not present

## 2019-09-29 DIAGNOSIS — K5900 Constipation, unspecified: Secondary | ICD-10-CM | POA: Diagnosis not present

## 2019-09-29 DIAGNOSIS — M549 Dorsalgia, unspecified: Secondary | ICD-10-CM | POA: Diagnosis not present

## 2019-09-29 DIAGNOSIS — M1990 Unspecified osteoarthritis, unspecified site: Secondary | ICD-10-CM | POA: Diagnosis not present

## 2019-09-29 DIAGNOSIS — M6281 Muscle weakness (generalized): Secondary | ICD-10-CM | POA: Diagnosis not present

## 2019-09-29 DIAGNOSIS — R011 Cardiac murmur, unspecified: Secondary | ICD-10-CM | POA: Diagnosis not present

## 2019-09-29 DIAGNOSIS — M2550 Pain in unspecified joint: Secondary | ICD-10-CM | POA: Diagnosis not present

## 2019-10-10 DIAGNOSIS — E785 Hyperlipidemia, unspecified: Secondary | ICD-10-CM | POA: Insufficient documentation

## 2019-10-10 NOTE — Progress Notes (Signed)
Cardiology Office Note   Date:  10/11/2019   ID:  Krista Bowman, DOB 1931/07/15, MRN [id number]  PCP:  Virgie Dad, MD  Cardiologist:   No primary care provider on file.   Chief Complaint  Patient presents with  . Aortic Stenosis      History of Present Illness: Krista Bowman is a 86 y.o. female who is referred by Virgie Dad, MD for evaulation of aortic stenosis.  She was noted recently to have a murmur.  She had an echo in Sept. This demonstrated a well-preserved ejection fraction.  The aortic valve mean gradient was 37.   Since she was last seen she was in the hospital with an incarcerated hernia and had to have surgery for this.  She was in the emergency room with a head laceration.  However, she has had no cardiac complaints.  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  She gets around with a walker The patient denies any new symptoms such as chest discomfort, neck or arm discomfort. There has been no new shortness of breath, PND or orthopnea. There have been no reported palpitations, presyncope or syncope.    Past Medical History:  Diagnosis Date  . Cerebral atrophy ([place]) 01/21/13  . Corns and callosities   . Hyperlipidemia   . Hypothyroid   . Osteoarthrosis, unspecified whether generalized or localized, unspecified site   . Osteoporosis, unspecified   . Other and unspecified hyperlipidemia   . Other generalized ischemic cerebrovascular disease 01/21/13   small vessel disease  . Syncope and collapse   . Umbilical hernia without mention of obstruction or gangrene   . Unspecified hearing loss   . Unspecified hypothyroidism   . Unspecified vitamin D deficiency   . Ventral hernia, unspecified, without mention of obstruction or gangrene     Past Surgical History:  Procedure Laterality Date  . CATARACT EXTRACTION W/ INTRAOCULAR LENS  IMPLANT, BILATERAL  2012   Dr. Bing Plume  . HERNIA REPAIR  01/2008   laparoscopic Dr. [name]  . INGUINAL  HERNIA REPAIR N/A 05/10/2019   Procedure: LAPAROSCOPIC FEMORAL HERNIA REPAIR WITH MESH;  Surgeon: Ralene Ok, MD;  Location: [facility];  Service: General;  Laterality: N/A;  . KNEE SURGERY  2008   Dr. Alyssa Grove  left  . TOE AMPUTATION  1986   little toe right foot  Dr. Dema Severin; [place]     Current Outpatient Medications  Medication Sig Dispense Refill  . acetaminophen (TYLENOL) 500 MG tablet Take 2 tablets (1,000 mg total) by mouth every 6 (six) hours as needed. 30 tablet 0  . aspirin 81 MG tablet Take 81 mg by mouth daily.    Marland Kitchen atorvastatin (LIPITOR) 10 MG tablet Take 1 tablet (10 mg total) by mouth daily. 90 tablet 3  . Calcium Carbonate-Vitamin D (CALCIUM 600+D) 600-200 MG-UNIT TABS Take 1 tablet by mouth 2 (two) times daily.    Marland Kitchen levothyroxine (SYNTHROID) 75 MCG tablet TAKE 1 TABLET DAILY 30 MINUTES BEFORE BREAKFAST FOR THYROID (Patient taking differently: Take 75 mcg by mouth daily before breakfast. ) 90 tablet 3  . Multiple Vitamin (MULTIVITAMIN WITH MINERALS) TABS Take 1 tablet by mouth daily.    . Omega-3 Fatty Acids (FISH OIL) 1200 MG CAPS Take 1 capsule by mouth daily.     No current facility-administered medications for this visit.    Allergies:   Mushroom extract complex and Penicillins    ROS:  Please see the history  of present illness.   Otherwise, review of systems are positive for none.   All other systems are reviewed and negative.    PHYSICAL EXAM: VS:  BP 118/68   Pulse 77   Ht 5' (1.524 m)   Wt 121 lb (54.9 kg)   SpO2 98%   BMI 23.63 kg/m  , BMI Body mass index is 23.63 kg/m. GENERAL:  Well appearing for her age NECK:  No jugular venous distention, waveform within normal limits, carotid upstroke brisk and symmetric, no bruits, no thyromegaly LUNGS:  Clear to auscultation bilaterally CHEST:  Unremarkable HEART:  PMI not displaced or sustained,S1 and S2 within normal limits, no S3, no S4, no clicks, no rubs, 3 out of 6 mid peaking apical systolic murmur  radiating at aortic outflow tract, no diastolic murmurs ABD:  Flat, positive bowel sounds normal in frequency in pitch, no bruits, no rebound, no guarding, no midline pulsatile mass, no hepatomegaly, no splenomegaly EXT:  2 plus pulses throughout, no edema, no cyanosis no clubbing  EKG:  EKG is not ordered today.    Recent Labs: 03/24/2019: TSH 2.33 05/11/2019: Magnesium 2.0 08/16/2019: ALT 18; BUN 20; Creat 1.09; Hemoglobin 12.2; Platelets 280; Potassium 4.4; Sodium 142    Lipid Panel    Component Value Date/Time   CHOL 140 03/24/2019 0700   TRIG 85 03/24/2019 0700   HDL 59 03/24/2019 0700   CHOLHDL 2.4 03/24/2019 0700   LDLCALC 64 03/24/2019 0700      Wt Readings from Last 3 Encounters:  10/11/19 121 lb (54.9 kg)  09/02/19 119 lb 9.6 oz (54.3 kg)  08/04/19 118 lb (53.5 kg)      Other studies Reviewed: Additional studies/ records that were reviewed today include: None Review of the above records demonstrates:  Please see elsewhere in the note.     ASSESSMENT AND PLAN:  Aortic Stenosis:   The gradient is stable.  Interestingly the patient did not remember our previous conversation about aortic stenosis.  I reiterated the need to look for symptoms.  She has none of these at this point.  Therefore, no further testing and I will follow her clinically every 6 months.    Covid education:     She has been vaccinated.    Current medicines are reviewed at length with the patient today.  The patient does not have concerns regarding medicines.  The following changes have been made:  None  Labs/ tests ordered today include:  None  No orders of the defined types were placed in this encounter.    Disposition:   FU with me APP in six months.   Signed, Minus Breeding, MD  10/11/2019 1:47 PM    [facility]

## 2019-10-11 ENCOUNTER — Encounter: Payer: Self-pay | Admitting: Cardiology

## 2019-10-11 ENCOUNTER — Other Ambulatory Visit: Payer: Self-pay

## 2019-10-11 ENCOUNTER — Ambulatory Visit: Payer: PPO | Admitting: Cardiology

## 2019-10-11 VITALS — BP 118/68 | HR 77 | Ht 60.0 in | Wt 121.0 lb

## 2019-10-11 DIAGNOSIS — I350 Nonrheumatic aortic (valve) stenosis: Secondary | ICD-10-CM

## 2019-10-11 DIAGNOSIS — E785 Hyperlipidemia, unspecified: Secondary | ICD-10-CM | POA: Diagnosis not present

## 2019-10-11 DIAGNOSIS — Z7189 Other specified counseling: Secondary | ICD-10-CM

## 2019-10-11 NOTE — Patient Instructions (Signed)
Medication Instructions:  NO CHANGES *If you need a refill on your cardiac medications before your next appointment, please call your pharmacy*   Lab Work:  NOT NEEEDED If you have labs (blood work) drawn today and your tests are completely normal, you will receive your results only by: Marland Kitchen MyChart Message (if you have MyChart) OR . A paper copy in the mail If you have any lab test that is abnormal or we need to change your treatment, we will call you to review the results.   Testing/Procedures: NOT NEEDED   Follow-Up: At [facility], you and your health needs are our priority.  As part of our continuing mission to provide you with exceptional heart care, we have created designated Provider Care Teams.  These Care Teams include your primary Cardiologist (physician) and Advanced Practice Providers (APPs -  Physician Assistants and Nurse Practitioners) who all work together to provide you with the care you need, when you need it.  We recommend signing up for the patient portal called "MyChart".  Sign up information is provided on this After Visit Summary.  MyChart is used to connect with patients for Virtual Visits (Telemedicine).  Patients are able to view lab/test results, encounter notes, upcoming appointments, etc.  Non-urgent messages can be sent to your provider as well.   To learn more about what you can do with MyChart, go to [email or web address].    Your next appointment:   6 month(s)  The format for your next appointment:   In Person  Provider:   Minus Breeding, MD

## 2019-11-15 ENCOUNTER — Telehealth: Payer: Self-pay

## 2019-11-15 NOTE — Telephone Encounter (Signed)
Patient called stating she did not have a follow up appointment scheduled after her visit in August. When is patient to return for follow up appointment. Please advise.

## 2019-11-16 ENCOUNTER — Telehealth: Payer: Self-pay

## 2019-11-16 NOTE — Telephone Encounter (Signed)
Please have her schedule with me in Dec Clinic Thanks

## 2019-11-16 NOTE — Telephone Encounter (Signed)
Called patient to schedule appointment with Dr. Lyndel Safe for December 2021, however Dr. Steve Rattler December schedule was completely booked. I offered to give patient an appointment with [facility], NP, but patient preferred to see Dr. Lyndel Safe. She was given Dr. Steve Rattler 1st available appointment, which is February 03, 2020 at 9:15 am.

## 2020-01-05 ENCOUNTER — Ambulatory Visit
Admission: RE | Admit: 2020-01-05 | Discharge: 2020-01-05 | Disposition: A | Discharge status: Home / Self Care | Payer: PPO | Source: Ambulatory Visit | Attending: Internal Medicine | Admitting: Internal Medicine

## 2020-01-05 ENCOUNTER — Other Ambulatory Visit: Payer: Self-pay

## 2020-01-05 DIAGNOSIS — M810 Age-related osteoporosis without current pathological fracture: Secondary | ICD-10-CM | POA: Diagnosis not present

## 2020-01-05 DIAGNOSIS — M85852 Other specified disorders of bone density and structure, left thigh: Secondary | ICD-10-CM | POA: Diagnosis not present

## 2020-01-05 DIAGNOSIS — M8580 Other specified disorders of bone density and structure, unspecified site: Secondary | ICD-10-CM

## 2020-01-05 DIAGNOSIS — Z780 Asymptomatic menopausal state: Secondary | ICD-10-CM | POA: Diagnosis not present

## 2020-01-18 ENCOUNTER — Ambulatory Visit: Payer: Self-pay | Admitting: Internal Medicine

## 2020-01-30 ENCOUNTER — Other Ambulatory Visit: Payer: Self-pay | Admitting: Internal Medicine

## 2020-01-30 DIAGNOSIS — N1831 Chronic kidney disease, stage 3a: Secondary | ICD-10-CM

## 2020-01-30 DIAGNOSIS — E039 Hypothyroidism, unspecified: Secondary | ICD-10-CM

## 2020-01-30 DIAGNOSIS — E782 Mixed hyperlipidemia: Secondary | ICD-10-CM

## 2020-01-30 DIAGNOSIS — R7303 Prediabetes: Secondary | ICD-10-CM

## 2020-01-31 ENCOUNTER — Other Ambulatory Visit: Payer: Self-pay

## 2020-01-31 DIAGNOSIS — E782 Mixed hyperlipidemia: Secondary | ICD-10-CM

## 2020-01-31 DIAGNOSIS — R7303 Prediabetes: Secondary | ICD-10-CM | POA: Diagnosis not present

## 2020-01-31 DIAGNOSIS — N1831 Chronic kidney disease, stage 3a: Secondary | ICD-10-CM | POA: Diagnosis not present

## 2020-01-31 DIAGNOSIS — E039 Hypothyroidism, unspecified: Secondary | ICD-10-CM | POA: Diagnosis not present

## 2020-02-01 LAB — CBC WITH DIFFERENTIAL/PLATELET
Absolute Monocytes: 348 cells/uL (ref 200–950)
Basophils Absolute: 18 cells/uL (ref 0–200)
Basophils Relative: 0.3 %
Eosinophils Absolute: 230 cells/uL (ref 15–500)
Eosinophils Relative: 3.9 %
HCT: 38.4 % (ref 35.0–45.0)
Hemoglobin: 12.6 g/dL (ref 11.7–15.5)
Lymphs Abs: 2148 cells/uL (ref 850–3900)
MCH: 29.2 pg (ref 27.0–33.0)
MCHC: 32.8 g/dL (ref 32.0–36.0)
MCV: 89.1 fL (ref 80.0–100.0)
MPV: 10.4 fL (ref 7.5–12.5)
Monocytes Relative: 5.9 %
Neutro Abs: 3157 cells/uL (ref 1500–7800)
Neutrophils Relative %: 53.5 %
Platelets: 304 10*3/uL (ref 140–400)
RBC: 4.31 10*6/uL (ref 3.80–5.10)
RDW: 12.8 % (ref 11.0–15.0)
Total Lymphocyte: 36.4 %
WBC: 5.9 10*3/uL (ref 3.8–10.8)

## 2020-02-01 LAB — COMPLETE METABOLIC PANEL WITH GFR
AG Ratio: 1.4 (calc) (ref 1.0–2.5)
ALT: 11 U/L (ref 6–29)
AST: 16 U/L (ref 10–35)
Albumin: 4.1 g/dL (ref 3.6–5.1)
Alkaline phosphatase (APISO): 56 U/L (ref 37–153)
BUN/Creatinine Ratio: 23 (calc) — ABNORMAL HIGH (ref 6–22)
BUN: 24 mg/dL (ref 7–25)
CO2: 26 mmol/L (ref 20–32)
Calcium: 9.4 mg/dL (ref 8.6–10.4)
Chloride: 107 mmol/L (ref 98–110)
Creat: 1.06 mg/dL — ABNORMAL HIGH (ref 0.60–0.88)
GFR, Est African American: 55 mL/min/{1.73_m2} — ABNORMAL LOW (ref >=60)
GFR, Est Non African American: 48 mL/min/{1.73_m2} — ABNORMAL LOW (ref >=60)
Globulin: 2.9 g/dL (calc) (ref 1.9–3.7)
Glucose, Bld: 98 mg/dL (ref 65–99)
Potassium: 4.6 mmol/L (ref 3.5–5.3)
Sodium: 142 mmol/L (ref 135–146)
Total Bilirubin: 0.5 mg/dL (ref 0.2–1.2)
Total Protein: 7 g/dL (ref 6.1–8.1)

## 2020-02-01 LAB — HEMOGLOBIN A1C
Hgb A1c MFr Bld: 5.9 % of total Hgb — ABNORMAL HIGH (ref <5.7)
Mean Plasma Glucose: 123 mg/dL
eAG (mmol/L): 6.8 mmol/L

## 2020-02-01 LAB — LIPID PANEL
Cholesterol: 155 mg/dL (ref <200)
HDL: 65 mg/dL (ref >=50)
LDL Cholesterol (Calc): 72 mg/dL (calc)
Non-HDL Cholesterol (Calc): 90 mg/dL (calc) (ref <130)
Total CHOL/HDL Ratio: 2.4 (calc) (ref <5.0)
Triglycerides: 98 mg/dL (ref <150)

## 2020-02-01 LAB — TSH: TSH: 0.9 mIU/L (ref 0.40–4.50)

## 2020-02-03 ENCOUNTER — Ambulatory Visit: Payer: PPO | Admitting: Internal Medicine

## 2020-02-03 ENCOUNTER — Other Ambulatory Visit: Payer: Self-pay

## 2020-02-03 ENCOUNTER — Encounter: Payer: Self-pay | Admitting: Internal Medicine

## 2020-02-03 VITALS — BP 108/70 | HR 71 | Temp 96.6°F | Ht 60.0 in | Wt 124.4 lb

## 2020-02-03 DIAGNOSIS — E039 Hypothyroidism, unspecified: Secondary | ICD-10-CM | POA: Diagnosis not present

## 2020-02-03 DIAGNOSIS — E782 Mixed hyperlipidemia: Secondary | ICD-10-CM

## 2020-02-03 DIAGNOSIS — N1831 Chronic kidney disease, stage 3a: Secondary | ICD-10-CM | POA: Diagnosis not present

## 2020-02-03 DIAGNOSIS — R7303 Prediabetes: Secondary | ICD-10-CM | POA: Diagnosis not present

## 2020-02-03 DIAGNOSIS — M8580 Other specified disorders of bone density and structure, unspecified site: Secondary | ICD-10-CM | POA: Diagnosis not present

## 2020-02-03 DIAGNOSIS — I350 Nonrheumatic aortic (valve) stenosis: Secondary | ICD-10-CM

## 2020-02-03 NOTE — Progress Notes (Signed)
Location:      Place of Service:     Provider:   Code Status:  Goals of Care:  Advanced Directives 07/27/2019  Does Patient Have a Medical Advance Directive? Yes  Type of Paramedic of [place];Living will  Does patient want to make changes to medical advance directive? -  Copy of [place] in Chart? (No Data)  Pre-existing out of facility DNR order (yellow form or pink MOST form) -     Chief Complaint  Patient presents with  . Medical Management of Chronic Issues    Patient returns to the clinic for follow up.     HPI: Patient is a 87 y.o. female seen today for medical management of chronic diseases.    Patient has a history of hypothyroidism, B12 deficiency, hyperlipidemia, CKD stage III, osteopenia and aortic stenosis and arthritis H/O SBO due to Left Incarcerated Femoral Hernia.  Requiring repair with mesh.  Is doing well. No Recent Falls. Walks with her walker. Had therapy for Right leg pain and weakness. Says it is much better though sometimes feel weak in that leg. No other issues today  Past Medical History:  Diagnosis Date  . Cerebral atrophy ([place]) 01/21/13  . Corns and callosities   . Hyperlipidemia   . Hypothyroid   . Osteoarthrosis, unspecified whether generalized or localized, unspecified site   . Osteoporosis, unspecified   . Other and unspecified hyperlipidemia   . Other generalized ischemic cerebrovascular disease 01/21/13   small vessel disease  . Syncope and collapse   . Umbilical hernia without mention of obstruction or gangrene   . Unspecified hearing loss   . Unspecified hypothyroidism   . Unspecified vitamin D deficiency   . Ventral hernia, unspecified, without mention of obstruction or gangrene     Past Surgical History:  Procedure Laterality Date  . CATARACT EXTRACTION W/ INTRAOCULAR LENS  IMPLANT, BILATERAL  2012   Dr. Bing Plume  . HERNIA REPAIR  01/2008   laparoscopic Dr. [name]  .  INGUINAL HERNIA REPAIR N/A 05/10/2019   Procedure: LAPAROSCOPIC FEMORAL HERNIA REPAIR WITH MESH;  Surgeon: Ralene Ok, MD;  Location: [facility];  Service: General;  Laterality: N/A;  . KNEE SURGERY  2008   Dr. Alyssa Grove  left  . TOE AMPUTATION  1986   little toe right foot  Dr. Dema Severin; [place]    Allergies  Allergen Reactions  . Mushroom Extract Complex Other (See Comments)    "Allergic," per paperwork from facility  . Penicillins Diarrhea and Other (See Comments)    "Allergic," per paperwork from facility    Outpatient Encounter Medications as of 02/03/2020  Medication Sig  . acetaminophen (TYLENOL) 500 MG tablet Take 2 tablets (1,000 mg total) by mouth every 6 (six) hours as needed.  Marland Kitchen aspirin 81 MG tablet Take 81 mg by mouth daily.  Marland Kitchen atorvastatin (LIPITOR) 10 MG tablet Take 1 tablet (10 mg total) by mouth daily.  . Calcium Carbonate-Vitamin D 600-200 MG-UNIT TABS Take 1 tablet by mouth 2 (two) times daily.  Marland Kitchen levothyroxine (SYNTHROID) 75 MCG tablet TAKE 1 TABLET DAILY 30 MINUTES BEFORE BREAKFAST FOR THYROID (Patient taking differently: Take 75 mcg by mouth daily before breakfast.)  . Multiple Vitamin (MULTIVITAMIN WITH MINERALS) TABS Take 1 tablet by mouth daily.  . Omega-3 Fatty Acids (FISH OIL) 1200 MG CAPS Take 1 capsule by mouth daily.   No facility-administered encounter medications on file as of 02/03/2020.    Review of Systems:  Review of Systems  Review of Systems  Constitutional: Negative for activity change, appetite change, chills, diaphoresis, fatigue and fever.  HENT: Negative for mouth sores, postnasal drip, rhinorrhea, sinus pain and sore throat.   Respiratory: Negative for apnea, cough, chest tightness, shortness of breath and wheezing.   Cardiovascular: Negative for chest pain, palpitations and leg swelling.  Gastrointestinal: Negative for abdominal distention, abdominal pain, constipation, diarrhea, nausea and vomiting.  Genitourinary: Negative for dysuria and  frequency.  Musculoskeletal: Negative for arthralgias, joint swelling and myalgias.  Skin: Negative for rash.  Neurological: Negative for dizziness, syncope, weakness, light-headedness and numbness.  Psychiatric/Behavioral: Negative for behavioral problems, confusion and sleep disturbance.     Health Maintenance  Topic Date Due  . COVID-19 Vaccine (4 - Booster for Moderna series) 06/04/2020  . TETANUS/TDAP  06/13/2027  . INFLUENZA VACCINE  Completed  . DEXA SCAN  Completed  . PNA vac Low Risk Adult  Completed    Physical Exam: Vitals:   02/03/20 0921  BP: 108/70  Pulse: 71  Temp: (!) 96.6 F (35.9 C)  SpO2: 99%  Weight: 124 lb 6.4 oz (56.4 kg)  Height: 5' (1.524 m)   Body mass index is 24.3 kg/m. Physical Exam Constitutional: Oriented to person, place, and time. Well-developed and well-nourished.  HENT:  Head: Normocephalic.  Ears TM normal Mouth/Throat: Oropharynx is clear and moist.  Eyes: Pupils are equal, round, and reactive to light.  Neck: Neck supple.  Cardiovascular: Normal rate and normal heart sounds.  Murmur present Pulmonary/Chest: Effort normal and breath sounds normal. No respiratory distress. No wheezes. She has no rales.  Abdominal: Soft. Bowel sounds are normal. No distension. There is no tenderness. There is no rebound.  Musculoskeletal: No edema.  Lymphadenopathy: none Neurological: Alert and oriented to person, place, and time.  Skin: Skin is warm and dry.  Psychiatric: Normal mood and affect. Behavior is normal. Thought content normal.   Labs reviewed: Basic Metabolic Panel: Recent Labs    03/24/19 0700 05/09/19 2345 05/11/19 0447 07/27/19 1514 08/16/19 0705 01/31/20 0700  NA 143   < > 140 142 142 142  K 4.4   < > 3.9 4.1 4.4 4.6  CL 107   < > 109 105 107 107  CO2 27   < > 22 28 26 26   GLUCOSE 104*   < > 104* 98 100* 98  BUN 19   < > 12 23 20 24   CREATININE 1.06*   < > 1.04* 1.14* 1.09* 1.06*  CALCIUM 9.4   < > 8.4* 9.0 9.3 9.4   MG  --   --  2.0  --   --   --   TSH 2.33  --   --   --   --  0.90   < > = values in this interval not displayed.   Liver Function Tests: Recent Labs    05/09/19 2345 05/11/19 0447 08/16/19 0705 01/31/20 0700  AST 28 23 21 16   ALT 30 19 18 11   ALKPHOS 53 41  --   --   BILITOT 0.7 0.6 0.4 0.5  PROT 7.0 5.5* 6.4 7.0  ALBUMIN 3.6 2.8*  --   --    Recent Labs    05/09/19 2345  LIPASE 33   No results for input(s): AMMONIA in the last 8760 hours. CBC: Recent Labs    07/27/19 1514 08/16/19 0705 01/31/20 0700  WBC 7.5 5.8 5.9  NEUTROABS 4.5 3,039 3,157  HGB 11.9* 12.2 12.6  HCT 36.3 36.9 38.4  MCV 91.4 91.1 89.1  PLT 302 280 304   Lipid Panel: Recent Labs    03/24/19 0700 01/31/20 0700  CHOL 140 155  HDL 59 65  LDLCALC 64 72  TRIG 85 98  CHOLHDL 2.4 2.4   Lab Results  Component Value Date   HGBA1C 5.9 (H) 01/31/2020    Procedures since last visit: DG BONE DENSITY (DXA)  Result Date: 01/06/2020 EXAM: DUAL X-RAY ABSORPTIOMETRY (DXA) FOR BONE MINERAL DENSITY IMPRESSION: Referring Physician:  Virgie Dad Your patient completed a BMD test using Lunar IDXA DXA system ( analysis version: 16 ) manufactured by [employer]. Technologist: KT PATIENT: Name: China, Deitrick Patient ID: [id number] Birth Date: 1931-08-27 Height: 59.0 in. Sex: Female Measured: 01/05/2020 Weight: 121.6 lbs. Indications: Advanced Age, Caucasian, Estrogen Deficient, Height Loss (781.91), History of Osteopenia, Hypothyroid, Levothyroxine, Postmenopausal, Secondary Osteoporosis Fractures: None Treatments: Calcium (E943.0), Vitamin D (E933.5) ASSESSMENT: The BMD measured at Femur Neck Left is 0.746 g/cm2 with a T-score of -2.1. This patient is considered OSTEOPENIC according to [facility]) criteria. The scan quality is good. L1 and L4 were excluded due to degenerative changes and overlying artifact. Site Region Measured Date Measured Age YA T-score BMD Significant CHANGE AP Spine   L2-L3      01/05/2020    86.7         -2.1    0.953 g/cm2 AP Spine  L2-L3      12/04/2016    83.6         -2.5    0.910 g/cm2 DualFemur Neck Left  01/05/2020    86.7         -2.1    0.746 g/cm2 DualFemur Neck Left  12/04/2016    83.6         -2.1    0.746 g/cm2 DualFemur Total Mean 01/05/2020    86.7         -1.7    0.798 g/cm2 DualFemur Total Mean 12/04/2016    83.6         -1.5    0.816 g/cm2 World Health Organization [facility]) criteria for post-menopausal, Caucasian Women: Normal       T-score at or above -1 SD Osteopenia   T-score between -1 and -2.5 SD Osteoporosis T-score at or below -2.5 SD RECOMMENDATION: 1. All patients should optimize calcium and vitamin D intake. 2. Consider FDA approved medical therapies in postmenopausal women and men aged 43 years and older, based on the following: a. A hip or vertebral (clinical or morphometric) fracture b. T-score < or = -2.5 at the femoral neck or spine after appropriate evaluation to exclude secondary causes c. Low bone mass (T-score between -1.0 and -2.5 at the femoral neck or spine) and a 10 year probability of a hip fracture > or = 3% or a 10 year probability of a major osteoporosis-related fracture > or = 20% based on the [place]-adapted WHO algorithm d. Clinician judgment and/or patient preferences may indicate treatment for people with 10-year fracture probabilities above or below these levels FOLLOW-UP: Patients with diagnosis of osteoporosis or at high risk for fracture should have regular bone mineral density tests. For patients eligible for Medicare, routine testing is allowed once every 2 years. The testing frequency can be increased to one year for patients who have rapidly progressing disease, those who are receiving or discontinuing medical therapy to restore bone mass, or have additional risk factors. I have reviewed this report and agree with  the above findings. Mark A. Thornton Papas, M.D. [facility] FRAX* 10-year Probability of Fracture Based on femoral  neck BMD: DualFemur (Left) Major Osteoporotic Fracture: 15.5% Hip Fracture:                5.2% Population:                  [place] (Caucasian) Risk Factors:                Secondary Osteoporosis *FRAX is a Materials engineer of the [employer] for Metabolic Bone Disease, a [place] (WHO) [facility]. ASSESSMENT: The probability of a major osteoporotic fracture is 15.5 % within the next ten years. The probability of a hip fracture is 5.2 % within the next ten years. I have reviewed this report and agree with the above findings. Mark A. Thornton Papas, M.D. [facility] Electronically Signed   By: Lavonia Dana M.D.   On: 01/06/2020 08:12    Assessment/Plan 1. Aortic valve stenosis, etiology of cardiac valve disease unspecified Asymptomatic  Follows with Cardiology  2. Mixed hyperlipidemia Continue on statin - TSH; Future - Lipid panel; Future  3. Prediabetes  - TSH; Future - Hemoglobin A1c; Future  4. Stage 3a chronic kidney disease (HCC) Creat stable - CBC with Differential/Platelet; Future - COMPLETE METABOLIC PANEL WITH GFR; Future  5. Osteopenia, unspecified location DEXA [place] -2.1 Continue Vit D and Calcium  6. Acquired hypothyroidism TSH normal    Labs/tests ordered:  * No order type specified * Next appt:  Visit date not found

## 2020-02-21 ENCOUNTER — Other Ambulatory Visit: Payer: Self-pay | Admitting: *Deleted

## 2020-02-21 MED ORDER — ATORVASTATIN CALCIUM 10 MG PO TABS: 10.0000 mg | ORAL_TABLET | Freq: Every day | ORAL | 3 refills | Status: DC

## 2020-02-21 NOTE — Telephone Encounter (Signed)
Patient requested refill

## 2020-04-09 ENCOUNTER — Ambulatory Visit: Payer: PPO | Admitting: Cardiology

## 2020-04-09 NOTE — Progress Notes (Signed)
Cardiology Office Note   Date:  04/10/2020   ID:  [name], DOB 1932/01/24, MRN [id number]  PCP:  Virgie Dad, MD  Cardiologist:   No primary care provider on file.   Chief Complaint  Patient presents with  . Heart Murmur      History of Present Illness: Emmylou Bieker is a 86 y.o. female who was referred by Virgie Dad, MD for evaulation of aortic stenosis.  She was noted recently to have a murmur.  She had an echo in Sept. This demonstrated a well-preserved ejection fraction.  The aortic valve mean gradient was 37 in March 2021.    Since she was last seen she has done okay.  She lives at [facility].  She walks with a walker and lives in independent living. The patient denies any new symptoms such as chest discomfort, neck or arm discomfort. There has been no new shortness of breath, PND or orthopnea. There have been no reported palpitations, presyncope or syncope.    Past Medical History:  Diagnosis Date  . Cerebral atrophy ([place]) 01/21/13  . Corns and callosities   . Hyperlipidemia   . Hypothyroid   . Osteoarthrosis, unspecified whether generalized or localized, unspecified site   . Osteoporosis, unspecified   . Other and unspecified hyperlipidemia   . Other generalized ischemic cerebrovascular disease 01/21/13   small vessel disease  . Syncope and collapse   . Umbilical hernia without mention of obstruction or gangrene   . Unspecified hearing loss   . Unspecified hypothyroidism   . Unspecified vitamin D deficiency   . Ventral hernia, unspecified, without mention of obstruction or gangrene     Past Surgical History:  Procedure Laterality Date  . CATARACT EXTRACTION W/ INTRAOCULAR LENS  IMPLANT, BILATERAL  2012   Dr. Bing Plume  . HERNIA REPAIR  01/2008   laparoscopic Dr. [name]  . INGUINAL HERNIA REPAIR N/A 05/10/2019   Procedure: LAPAROSCOPIC FEMORAL HERNIA REPAIR WITH MESH;  Surgeon: Ralene Ok, MD;  Location: [facility];  Service: General;   Laterality: N/A;  . KNEE SURGERY  2008   Dr. Alyssa Grove  left  . TOE AMPUTATION  1986   little toe right foot  Dr. Dema Severin; [place]     Current Outpatient Medications  Medication Sig Dispense Refill  . acetaminophen (TYLENOL) 500 MG tablet Take 2 tablets (1,000 mg total) by mouth every 6 (six) hours as needed. 30 tablet 0  . aspirin 81 MG tablet Take 81 mg by mouth daily.    Marland Kitchen atorvastatin (LIPITOR) 10 MG tablet Take 1 tablet (10 mg total) by mouth daily. 90 tablet 3  . Calcium Carbonate-Vitamin D 600-200 MG-UNIT TABS Take 1 tablet by mouth 2 (two) times daily.    Marland Kitchen levothyroxine (SYNTHROID) 75 MCG tablet TAKE 1 TABLET DAILY 30 MINUTES BEFORE BREAKFAST FOR THYROID (Patient taking differently: Take 75 mcg by mouth daily before breakfast.) 90 tablet 3  . Multiple Vitamin (MULTIVITAMIN WITH MINERALS) TABS Take 1 tablet by mouth daily.    . Omega-3 Fatty Acids (FISH OIL) 1200 MG CAPS Take 1 capsule by mouth daily.     No current facility-administered medications for this visit.    Allergies:   Mushroom extract complex and Penicillins    ROS:  Please see the history of present illness.   Otherwise, review of systems are positive for none.   All other systems are reviewed and negative.    PHYSICAL EXAM: VS:  BP 122/60 (BP Location:  Left Arm, Patient Position: Sitting)   Pulse 78   Ht 5' (1.524 m)   Wt 124 lb 6.4 oz (56.4 kg)   SpO2 98%   BMI 24.30 kg/m  , BMI Body mass index is 24.3 kg/m. GENERAL:  Well appearing for her age though slightly frail NECK:  No jugular venous distention, waveform within normal limits, carotid upstroke brisk and symmetric, no bruits, no thyromegaly LUNGS:  Clear to auscultation bilaterally CHEST:  Unremarkable HEART:  PMI not displaced or sustained,S1 and S2 within normal limits, no S3, no S4, no clicks, no rubs, 3 out of 6 mid to late peaking systolic murmur radiating up aortic outflow tract, no diastolic murmurs ABD:  Flat, positive bowel sounds normal  in frequency in pitch, no bruits, no rebound, no guarding, no midline pulsatile mass, no hepatomegaly, no splenomegaly EXT:  2 plus pulses throughout, no edema, no cyanosis no clubbing    EKG:  EKG is  ordered today. Sinus rhythm, rate 78, axis within normal limits, intervals within normal limits, no acute ST-T wave changes.   Recent Labs: 05/11/2019: Magnesium 2.0 01/31/2020: ALT 11; BUN 24; Creat 1.06; Hemoglobin 12.6; Platelets 304; Potassium 4.6; Sodium 142; TSH 0.90    Lipid Panel    Component Value Date/Time   CHOL 155 01/31/2020 0700   TRIG 98 01/31/2020 0700   HDL 65 01/31/2020 0700   CHOLHDL 2.4 01/31/2020 0700   LDLCALC 72 01/31/2020 0700      Wt Readings from Last 3 Encounters:  04/10/20 124 lb 6.4 oz (56.4 kg)  02/03/20 124 lb 6.4 oz (56.4 kg)  10/11/19 121 lb (54.9 kg)      Other studies Reviewed: Additional studies/ records that were reviewed today include:  None  Review of the above records demonstrates:    Please see elsewhere in the note.     ASSESSMENT AND PLAN:  Aortic Stenosis:    She symptomatically has had no change.  She had a stable gradient.  I probably am going to follow this clinically with periodic echocardiograms.  We reviewed the anatomy again today.  I will see her back in 6 months given the significance of the gradient.    Current medicines are reviewed at length with the patient today.  The patient does not have concerns regarding medicines.  The following changes have been made:  None  Labs/ tests ordered today include:  None  Orders Placed This Encounter  Procedures  . EKG 12-Lead     Disposition:   FU with me in six months.    Signed, Minus Breeding, MD  04/10/2020 2:25 PM    [facility]

## 2020-04-10 ENCOUNTER — Other Ambulatory Visit: Payer: Self-pay

## 2020-04-10 ENCOUNTER — Ambulatory Visit (INDEPENDENT_AMBULATORY_CARE_PROVIDER_SITE_OTHER): Payer: PPO | Admitting: Cardiology

## 2020-04-10 ENCOUNTER — Encounter: Payer: Self-pay | Admitting: Cardiology

## 2020-04-10 VITALS — BP 122/60 | HR 78 | Ht 60.0 in | Wt 124.4 lb

## 2020-04-10 DIAGNOSIS — I350 Nonrheumatic aortic (valve) stenosis: Secondary | ICD-10-CM

## 2020-04-10 NOTE — Patient Instructions (Signed)
Medication Instructions:  Continue current medications  *If you need a refill on your cardiac medications before your next appointment, please call your pharmacy*   Lab Work: None Ordered   Testing/Procedures: None ordered   Follow-Up: At [facility], you and your health needs are our priority.  As part of our continuing mission to provide you with exceptional heart care, we have created designated Provider Care Teams.  These Care Teams include your primary Cardiologist (physician) and Advanced Practice Providers (APPs -  Physician Assistants and Nurse Practitioners) who all work together to provide you with the care you need, when you need it.  We recommend signing up for the patient portal called "MyChart".  Sign up information is provided on this After Visit Summary.  MyChart is used to connect with patients for Virtual Visits (Telemedicine).  Patients are able to view lab/test results, encounter notes, upcoming appointments, etc.  Non-urgent messages can be sent to your provider as well.   To learn more about what you can do with MyChart, go to [email or web address].    Your next appointment:   6 month(s)  The format for your next appointment:   In Person  Provider:   You may see Minus Breeding, MD or one of the following Advanced Practice Providers on your designated Care Team:    Rosaria Ferries, PA-C  Jory Sims, DNP, ANP

## 2020-04-20 ENCOUNTER — Non-Acute Institutional Stay: Payer: PPO | Admitting: Internal Medicine

## 2020-04-20 ENCOUNTER — Other Ambulatory Visit: Payer: Self-pay

## 2020-04-20 ENCOUNTER — Encounter: Payer: Self-pay | Admitting: Internal Medicine

## 2020-04-20 VITALS — BP 144/72 | HR 71 | Temp 97.1°F | Ht 60.0 in | Wt 125.8 lb

## 2020-04-20 DIAGNOSIS — E039 Hypothyroidism, unspecified: Secondary | ICD-10-CM | POA: Diagnosis not present

## 2020-04-20 DIAGNOSIS — R7303 Prediabetes: Secondary | ICD-10-CM

## 2020-04-20 DIAGNOSIS — M79604 Pain in right leg: Secondary | ICD-10-CM

## 2020-04-20 DIAGNOSIS — M8580 Other specified disorders of bone density and structure, unspecified site: Secondary | ICD-10-CM | POA: Diagnosis not present

## 2020-04-20 DIAGNOSIS — E782 Mixed hyperlipidemia: Secondary | ICD-10-CM | POA: Diagnosis not present

## 2020-04-20 DIAGNOSIS — M79645 Pain in left finger(s): Secondary | ICD-10-CM | POA: Diagnosis not present

## 2020-04-20 DIAGNOSIS — N1831 Chronic kidney disease, stage 3a: Secondary | ICD-10-CM | POA: Diagnosis not present

## 2020-04-20 DIAGNOSIS — I350 Nonrheumatic aortic (valve) stenosis: Secondary | ICD-10-CM | POA: Diagnosis not present

## 2020-04-23 NOTE — Progress Notes (Signed)
Location:  [place] of Service:  Clinic (12)  Provider:   Code Status:  Goals of Care:  Advanced Directives 07/27/2019  Does Patient Have a Medical Advance Directive? Yes  Type of Paramedic of [place];Living will  Does patient want to make changes to medical advance directive? -  Copy of [place] in Chart? (No Data)  Pre-existing out of facility DNR order (yellow form or pink MOST form) -     Chief Complaint  Patient presents with  . Medical Management of Chronic Issues    Patient returns to the clinic for follow up.     HPI: Patient is a 87 y.o. female seen today for medical management of chronic diseases.    Patient has a history of hypothyroidism, B12 deficiency, hyperlipidemia, CKD stage III, osteopenia and aortic stenosis and arthritis H/OSBO due to Left Incarcerated Femoral Hernia.Requiring repair with mesh.  Doing well. No Falls. Walks with her walker. Lives in her apartment in [place] No SOB or Chest pain Mood stable Has daughter in [place] who is POA Past Medical History:  Diagnosis Date  . Cerebral atrophy ([place]) 01/21/13  . Corns and callosities   . Hyperlipidemia   . Hypothyroid   . Osteoarthrosis, unspecified whether generalized or localized, unspecified site   . Osteoporosis, unspecified   . Other and unspecified hyperlipidemia   . Other generalized ischemic cerebrovascular disease 01/21/13   small vessel disease  . Syncope and collapse   . Umbilical hernia without mention of obstruction or gangrene   . Unspecified hearing loss   . Unspecified hypothyroidism   . Unspecified vitamin D deficiency   . Ventral hernia, unspecified, without mention of obstruction or gangrene     Past Surgical History:  Procedure Laterality Date  . CATARACT EXTRACTION W/ INTRAOCULAR LENS  IMPLANT, BILATERAL  2012   Dr. Bing Plume  . HERNIA REPAIR  01/2008   laparoscopic Dr. [name]  . INGUINAL  HERNIA REPAIR N/A 05/10/2019   Procedure: LAPAROSCOPIC FEMORAL HERNIA REPAIR WITH MESH;  Surgeon: Ralene Ok, MD;  Location: [facility];  Service: General;  Laterality: N/A;  . KNEE SURGERY  2008   Dr. Alyssa Grove  left  . TOE AMPUTATION  1986   little toe right foot  Dr. Dema Severin; [place]    Allergies  Allergen Reactions  . Mushroom Extract Complex Other (See Comments)    "Allergic," per paperwork from facility  . Penicillins Diarrhea and Other (See Comments)    "Allergic," per paperwork from facility    Outpatient Encounter Medications as of 04/20/2020  Medication Sig  . acetaminophen (TYLENOL) 500 MG tablet Take 2 tablets (1,000 mg total) by mouth every 6 (six) hours as needed.  Marland Kitchen aspirin 81 MG tablet Take 81 mg by mouth daily.  Marland Kitchen atorvastatin (LIPITOR) 10 MG tablet Take 1 tablet (10 mg total) by mouth daily.  . Calcium Carbonate-Vitamin D 600-200 MG-UNIT TABS Take 1 tablet by mouth 2 (two) times daily.  Marland Kitchen levothyroxine (SYNTHROID) 75 MCG tablet TAKE 1 TABLET DAILY 30 MINUTES BEFORE BREAKFAST FOR THYROID (Patient taking differently: Take 75 mcg by mouth daily before breakfast.)  . Multiple Vitamin (MULTIVITAMIN WITH MINERALS) TABS Take 1 tablet by mouth daily.  . Omega-3 Fatty Acids (FISH OIL) 1200 MG CAPS Take 1 capsule by mouth daily.   No facility-administered encounter medications on file as of 04/20/2020.    Review of Systems:  Review of Systems  Review of Systems  Constitutional: Negative for activity change, appetite change, chills, diaphoresis, fatigue and fever.  HENT: Negative for mouth sores, postnasal drip, rhinorrhea, sinus pain and sore throat.   Respiratory: Negative for apnea, cough, chest tightness, shortness of breath and wheezing.   Cardiovascular: Negative for chest pain, palpitations and leg swelling.  Gastrointestinal: Negative for abdominal distention, abdominal pain, constipation, diarrhea, nausea and vomiting.  Genitourinary: Negative for dysuria and  frequency.  Musculoskeletal: Negative for arthralgias, joint swelling and myalgias.  Skin: Negative for rash.  Neurological: Negative for dizziness, syncope, weakness, light-headedness and numbness.  Psychiatric/Behavioral: Negative for behavioral problems, confusion and sleep disturbance.     Health Maintenance  Topic Date Due  . TETANUS/TDAP  06/13/2027  . INFLUENZA VACCINE  Completed  . DEXA SCAN  Completed  . COVID-19 Vaccine  Completed  . PNA vac Low Risk Adult  Completed  . HPV VACCINES  Aged Out    Physical Exam: Vitals:   04/20/20 1058  BP: (!) 144/72  Pulse: 71  Temp: (!) 97.1 F (36.2 C)  SpO2: 96%  Weight: 125 lb 12.8 oz (57.1 kg)  Height: 5' (1.524 m)   Body mass index is 24.57 kg/m. Physical Exam  Constitutional: Oriented to person, place, and time. Well-developed and well-nourished.  HENT:  Head: Normocephalic.  Mouth/Throat: Oropharynx is clear and moist.  Eyes: Pupils are equal, round, and reactive to light.  Neck: Neck supple.  Cardiovascular: Normal rate and normal heart sounds.  Positive for [place] Pulmonary/Chest: Effort normal and breath sounds normal. No respiratory distress. No wheezes. She has no rales.  Abdominal: Soft. Bowel sounds are normal. No distension. There is no tenderness. There is no rebound.  Musculoskeletal: No edema.  Lymphadenopathy: none Neurological: Alert and oriented to person, place, and time.  Skin: Skin is warm and dry.  Psychiatric: Normal mood and affect. Behavior is normal. Thought content normal.    Labs reviewed: Basic Metabolic Panel: Recent Labs    05/11/19 0447 07/27/19 1514 08/16/19 0705 01/31/20 0700  NA 140 142 142 142  K 3.9 4.1 4.4 4.6  CL 109 105 107 107  CO2 22 28 26 26   GLUCOSE 104* 98 100* 98  BUN 12 23 20 24   CREATININE 1.04* 1.14* 1.09* 1.06*  CALCIUM 8.4* 9.0 9.3 9.4  MG 2.0  --   --   --   TSH  --   --   --  0.90   Liver Function Tests: Recent Labs    05/09/19 2345 05/11/19 0447  08/16/19 0705 01/31/20 0700  AST 28 23 21 16   ALT 30 19 18 11   ALKPHOS 53 41  --   --   BILITOT 0.7 0.6 0.4 0.5  PROT 7.0 5.5* 6.4 7.0  ALBUMIN 3.6 2.8*  --   --    Recent Labs    05/09/19 2345  LIPASE 33   No results for input(s): AMMONIA in the last 8760 hours. CBC: Recent Labs    07/27/19 1514 08/16/19 0705 01/31/20 0700  WBC 7.5 5.8 5.9  NEUTROABS 4.5 3,039 3,157  HGB 11.9* 12.2 12.6  HCT 36.3 36.9 38.4  MCV 91.4 91.1 89.1  PLT 302 280 304   Lipid Panel: Recent Labs    01/31/20 0700  CHOL 155  HDL 65  LDLCALC 72  TRIG 98  CHOLHDL 2.4   Lab Results  Component Value Date   HGBA1C 5.9 (H) 01/31/2020    Procedures since last visit: No results found.  Assessment/Plan 1. Aortic valve stenosis,  etiology of cardiac valve disease unspecified Continues to be Asymptomatic Follows with Cardiology  2. Mixed hyperlipidemia Doing well on Statin  3. Prediabetes A1C less then 6  4. Stage 3a chronic kidney disease (HCC) Creat stable 5. Osteopenia, unspecified location DEXA [place] -2.1  6. Acquired hypothyroidism TSH normal Levels  7. Right leg pain Walks with the walker Did well by Therapy  8. Pain of left thumb Wears a brace PRN    Labs/tests ordered:  * No order type specified * Next appt:  09/11/2020   Total time spent in this patient care encounter was  45_  minutes; greater than 50% of the visit spent counseling patient and staff, reviewing records , Labs and coordinating care for problems addressed at this encounter.

## 2020-05-10 ENCOUNTER — Other Ambulatory Visit: Payer: Self-pay

## 2020-05-10 DIAGNOSIS — E039 Hypothyroidism, unspecified: Secondary | ICD-10-CM

## 2020-05-10 MED ORDER — LEVOTHYROXINE SODIUM 75 MCG PO TABS: ORAL_TABLET | ORAL | 1 refills | Status: DC

## 2020-05-10 MED ORDER — ATORVASTATIN CALCIUM 10 MG PO TABS: 10.0000 mg | ORAL_TABLET | Freq: Every day | ORAL | 1 refills | Status: DC

## 2020-06-22 ENCOUNTER — Other Ambulatory Visit: Payer: Self-pay | Admitting: Nurse Practitioner

## 2020-06-22 DIAGNOSIS — E039 Hypothyroidism, unspecified: Secondary | ICD-10-CM

## 2020-06-28 ENCOUNTER — Other Ambulatory Visit: Payer: Self-pay | Admitting: Nurse Practitioner

## 2020-06-28 DIAGNOSIS — E039 Hypothyroidism, unspecified: Secondary | ICD-10-CM

## 2020-07-09 ENCOUNTER — Ambulatory Visit: Payer: Self-pay | Admitting: Family

## 2020-08-20 DIAGNOSIS — H11042 Peripheral pterygium, stationary, left eye: Secondary | ICD-10-CM | POA: Diagnosis not present

## 2020-08-20 DIAGNOSIS — Z961 Presence of intraocular lens: Secondary | ICD-10-CM | POA: Diagnosis not present

## 2020-08-20 DIAGNOSIS — H35373 Puckering of macula, bilateral: Secondary | ICD-10-CM | POA: Diagnosis not present

## 2020-08-20 DIAGNOSIS — H04123 Dry eye syndrome of bilateral lacrimal glands: Secondary | ICD-10-CM | POA: Diagnosis not present

## 2020-08-20 DIAGNOSIS — D23112 Other benign neoplasm of skin of right lower eyelid, including canthus: Secondary | ICD-10-CM | POA: Diagnosis not present

## 2020-09-10 DIAGNOSIS — E039 Hypothyroidism, unspecified: Secondary | ICD-10-CM | POA: Diagnosis not present

## 2020-09-10 DIAGNOSIS — N1831 Chronic kidney disease, stage 3a: Secondary | ICD-10-CM | POA: Diagnosis not present

## 2020-09-10 DIAGNOSIS — E782 Mixed hyperlipidemia: Secondary | ICD-10-CM | POA: Diagnosis not present

## 2020-09-11 ENCOUNTER — Other Ambulatory Visit: Payer: Self-pay

## 2020-09-11 DIAGNOSIS — E039 Hypothyroidism, unspecified: Secondary | ICD-10-CM

## 2020-09-11 DIAGNOSIS — E782 Mixed hyperlipidemia: Secondary | ICD-10-CM

## 2020-09-11 DIAGNOSIS — N1831 Chronic kidney disease, stage 3a: Secondary | ICD-10-CM

## 2020-09-12 LAB — COMPLETE METABOLIC PANEL WITH GFR
AG Ratio: 1.4 (calc) (ref 1.0–2.5)
ALT: 11 U/L (ref 6–29)
AST: 14 U/L (ref 10–35)
Albumin: 3.9 g/dL (ref 3.6–5.1)
Alkaline phosphatase (APISO): 55 U/L (ref 37–153)
BUN/Creatinine Ratio: 17 (calc) (ref 6–22)
BUN: 19 mg/dL (ref 7–25)
CO2: 27 mmol/L (ref 20–32)
Calcium: 9.1 mg/dL (ref 8.6–10.4)
Chloride: 108 mmol/L (ref 98–110)
Creat: 1.09 mg/dL — ABNORMAL HIGH (ref 0.60–0.95)
Globulin: 2.8 g/dL (calc) (ref 1.9–3.7)
Glucose, Bld: 91 mg/dL (ref 65–99)
Potassium: 4.3 mmol/L (ref 3.5–5.3)
Sodium: 143 mmol/L (ref 135–146)
Total Bilirubin: 0.5 mg/dL (ref 0.2–1.2)
Total Protein: 6.7 g/dL (ref 6.1–8.1)
eGFR: 49 mL/min/{1.73_m2} — ABNORMAL LOW (ref >=60)

## 2020-09-12 LAB — CBC WITH DIFFERENTIAL/PLATELET
Absolute Monocytes: 401 cells/uL (ref 200–950)
Basophils Absolute: 18 cells/uL (ref 0–200)
Basophils Relative: 0.3 %
Eosinophils Absolute: 295 cells/uL (ref 15–500)
Eosinophils Relative: 5 %
HCT: 38.5 % (ref 35.0–45.0)
Hemoglobin: 12.5 g/dL (ref 11.7–15.5)
Lymphs Abs: 1906 cells/uL (ref 850–3900)
MCH: 29.8 pg (ref 27.0–33.0)
MCHC: 32.5 g/dL (ref 32.0–36.0)
MCV: 91.7 fL (ref 80.0–100.0)
MPV: 10.3 fL (ref 7.5–12.5)
Monocytes Relative: 6.8 %
Neutro Abs: 3280 cells/uL (ref 1500–7800)
Neutrophils Relative %: 55.6 %
Platelets: 292 10*3/uL (ref 140–400)
RBC: 4.2 10*6/uL (ref 3.80–5.10)
RDW: 13.2 % (ref 11.0–15.0)
Total Lymphocyte: 32.3 %
WBC: 5.9 10*3/uL (ref 3.8–10.8)

## 2020-09-12 LAB — TSH: TSH: 0.71 mIU/L (ref 0.40–4.50)

## 2020-09-12 LAB — LIPID PANEL
Cholesterol: 144 mg/dL (ref <200)
HDL: 62 mg/dL (ref >=50)
LDL Cholesterol (Calc): 60 mg/dL (calc)
Non-HDL Cholesterol (Calc): 82 mg/dL (calc) (ref <130)
Total CHOL/HDL Ratio: 2.3 (calc) (ref <5.0)
Triglycerides: 135 mg/dL (ref <150)

## 2020-09-14 ENCOUNTER — Encounter: Payer: Self-pay | Admitting: Internal Medicine

## 2020-09-19 ENCOUNTER — Encounter: Payer: Self-pay | Admitting: Family Medicine

## 2020-09-19 ENCOUNTER — Other Ambulatory Visit: Payer: Self-pay

## 2020-09-19 ENCOUNTER — Non-Acute Institutional Stay: Payer: PPO | Admitting: Family Medicine

## 2020-09-19 VITALS — BP 118/68 | HR 81 | Temp 98.6°F | Wt 129.4 lb

## 2020-09-19 DIAGNOSIS — E785 Hyperlipidemia, unspecified: Secondary | ICD-10-CM

## 2020-09-19 DIAGNOSIS — R634 Abnormal weight loss: Secondary | ICD-10-CM | POA: Diagnosis not present

## 2020-09-19 DIAGNOSIS — N1831 Chronic kidney disease, stage 3a: Secondary | ICD-10-CM

## 2020-09-19 DIAGNOSIS — I350 Nonrheumatic aortic (valve) stenosis: Secondary | ICD-10-CM

## 2020-09-19 DIAGNOSIS — E782 Mixed hyperlipidemia: Secondary | ICD-10-CM

## 2020-09-19 DIAGNOSIS — E039 Hypothyroidism, unspecified: Secondary | ICD-10-CM | POA: Diagnosis not present

## 2020-09-19 MED ORDER — LEVOTHYROXINE SODIUM 75 MCG PO TABS: ORAL_TABLET | ORAL | 1 refills | Status: DC

## 2020-09-19 NOTE — Progress Notes (Signed)
Provider:  [name], MD  Careteam: Patient Care Team: [name], MD as PCP - General (Family Medicine) [name], [facility] [name], [name], NP as Nurse Practitioner (Nurse Practitioner) [name], MD as Consulting Physician (Ophthalmology) [name], MD as Consulting Physician (Orthopedic Surgery) [name], [name], MD as Consulting Physician (Dermatology) [name], MD (Inactive) as Consulting Physician (Gastroenterology) [name], MD as Consulting Physician (General Surgery)  PLACE OF SERVICE:  [facility]  Advanced Directive information    Allergies  Allergen Reactions   Mushroom Extract Complex Other (See Comments)    "Allergic," per paperwork from facility   Penicillins Diarrhea and Other (See Comments)    "Allergic," per paperwork from facility    No chief complaint on file.    HPI: Patient is a 87 y.o. female.  Patient presents today for 24-monthfollow-up.  She is accompanied by her daughter who has some concerns about her mother's memory.  She had an MMSE today and scored a perfect 30 out of 30.  Daughter relates that she has to tell her mother same thing over and over and she is concerned about her memory loss.  I explained about the fact that there is some cerebral atrophy with age and that it was probably related.  Ms. PBurgooncontinues to works at [employer] Leeand functions well and this environment. She has no other specific complaints today. We did review labs from last week including lipid levels A1c which suggest prediabetes and renal function which shows a chronic kidney disease stage IIIa.  Review of Systems:  Review of Systems  Constitutional: Negative.   Respiratory: Negative.    Cardiovascular: Negative.   Genitourinary: Negative.   Psychiatric/Behavioral:  Positive for memory loss.   All other systems reviewed and are negative.  Past Medical History:  Diagnosis Date   [place]bral [place] 01/21[place]13   Corns and  callosities    Hyperlipidemia    Hypothyroid    Osteoarthrosis, unspecified whether generalized or localized, unspecified site    Osteoporosis, unspecified    Other and unspecified hyperlipidemia    Other generalized ischemic cerebrovascular disease 01/21/13   small vessel disease   Syncope and collapse    Umbilical hernia without mention of obstruction or gangrene    Unspecified hearing loss    Unspecified hypothyroidism    Unspecified vitamin D deficiency    Ventral hernia, unspecified, without mention of obstruction or gangrene    Past Surgical History:  Procedure Laterality Date   CATARACT EXTRACTION W/ INTRAOCULAR LENS  IMPLANT, BILATERAL  2012   Dr. DBing Plume  HERNIA REPAIR  01/2008   laparoscopic Dr. DAlphonsa Overall  INGUINAL HERNIA REPAIR N/A 05/10/2019   Procedure: LAPAROSCOPIC FEMORAL HERNIA REPAIR WITH MESH;  Surgeon: RRalene Ok MD;  [facility]  Service: General;  Laterality: N/A;   KNEE SURGERY  2008   Dr. DAlyssa Grove left   TOE AMPUTATION  1986   little toe right foot  Dr. WDema Severin [place]   Social History:   reports that she has never smoked. She has never used smokeless tobacco. She reports that she does not drink alcohol and does not use drugs.  Family History  Problem Relation Age of Onset   Melanoma Sister    Cancer Sister     Medications: Patient's Medications  New Prescriptions   No medications on file  Previous Medications   ACETAMINOPHEN (TYLENOL) 500 MG TABLET    Take 2 tablets (1,000 mg total)  by mouth every 6 (six) hours as needed.   ASPIRIN 81 MG TABLET    Take 81 mg by mouth daily.   ATORVASTATIN (LIPITOR) 10 MG TABLET    Take 1 tablet (10 mg total) by mouth daily.   CALCIUM CARBONATE-VITAMIN D 600-200 MG-UNIT TABS    Take 1 tablet by mouth 2 (two) times daily.   LEVOTHYROXINE (SYNTHROID) 75 MCG TABLET    TAKE 1 TABLET DAILY 30 MINUTES BEFORE BREAKFAST FOR THYROID   MULTIPLE VITAMIN (MULTIVITAMIN WITH MINERALS) TABS    Take 1 tablet by  mouth daily.   OMEGA-3 FATTY ACIDS (FISH OIL) 1200 MG CAPS    Take 1 capsule by mouth daily.  Modified Medications   No medications on file  Discontinued Medications   No medications on file    Physical Exam:  There were no vitals filed for this visit. There is no height or weight on file to calculate BMI. Wt Readings from Last 3 Encounters:  04/20/20 125 lb 12.8 oz (57.1 kg)  04/10/20 124 lb 6.4 oz (56.4 kg)  02/03/20 124 lb 6.4 oz (56.4 kg)    Physical Exam Vitals and nursing note reviewed.  Constitutional:      Appearance: Normal appearance.  Cardiovascular:     Rate and Rhythm: Normal rate and regular rhythm.     Heart sounds: Murmur heard.  Pulmonary:     Effort: Pulmonary effort is normal.     Breath sounds: Normal breath sounds.  Skin:    General: Skin is warm.     Findings: No rash.  Neurological:     General: No focal deficit present.     Mental Status: She is alert and oriented to person, place, and time.    Labs reviewed: Basic Metabolic Panel: Recent Labs    01/31/20 0700 09/10/20 1046  NA 142 143  K 4.6 4.3  CL 107 108  CO2 26 27  GLUCOSE 98 91  BUN 24 19  CREATININE 1.06* 1.09*  CALCIUM 9.4 9.1  TSH 0.90 0.71   Liver Function Tests: Recent Labs    01/31/20 0700 09/10/20 1046  AST 16 14  ALT 11 11  BILITOT 0.5 0.5  PROT 7.0 6.7   No results for input(s): LIPASE, AMYLASE in the last 8760 hours. No results for input(s): AMMONIA in the last 8760 hours. CBC: Recent Labs    01/31/20 0700 09/10/20 1046  WBC 5.9 5.9  NEUTROABS 3,157 3,280  HGB 12.6 12.5  HCT 38.4 38.5  MCV 89.1 91.7  PLT 304 292   Lipid Panel: Recent Labs    01/31/20 0700 09/10/20 1046  CHOL 155 144  HDL 65 62  LDLCALC 72 60  TRIG 98 135  CHOLHDL 2.4 2.3   TSH: Recent Labs    01/31/20 0700 09/10/20 1046  TSH 0.90 0.71   A1C: Lab Results  Component Value Date   HGBA1C 5.9 (H) 01/31/2020     Assessment/Plan  1. Hypothyroidism, unspecified  type Most recent TSH 0.7.  No change in thyroid supplement recommended - levothyroxine (SYNTHROID) 75 MCG tablet; Take one tablet daily.  Dispense: 90 tablet; Refill: 1  2. Mixed hyperlipidemia Patient continues on 10 mg of atorvastatin.  Both patient and daughter are interested in discontinuing statin.  I think that is a reasonable request will discontinue after current prescription expires and recheck lipids after she has been off drug for 2 to 3 months. - Lipid Panel; Future  3. Aortic valve stenosis, etiology of cardiac  valve disease unspecified Basically she is asymptomatic.  She is followed by cardiology every 6 months  4. Dyslipidemia See discussion under number to above  5. Stage 3a chronic kidney disease ([place]) Stre.  Chronic problem with not drinking enough.  Ssed importance of hydration  6. Weight loss Weight seems stable and is up 3-1/2 pounds since previous visit 4 months ago   [name], MD [place] [phone]

## 2020-09-19 NOTE — Patient Instructions (Signed)
Stop atorvastatin when you run out of prescription and repeat lipids in 2 months

## 2020-09-19 NOTE — Addendum Note (Signed)
Addended by: [name] on: 09/19/2020 05:07 PM   Modules accepted: Level of Service

## 2020-09-25 ENCOUNTER — Encounter: Payer: Self-pay | Admitting: Cardiology

## 2020-09-25 NOTE — Progress Notes (Signed)
Cardiology Office Note   Date:  09/26/2020   ID:  Krista Bowman, DOB [birth year], MRN [id number]  PCP:  [name], MD  Cardiologist:   None   Chief Complaint  Patient presents with   Aortic Stenosis       History of Present Illness: Krista Bowman is a 87 y.o. female who was referred by [name], MD for evaulation of aortic stenosis.  She was noted recently to have a murmur.  She had an echo in Sept. This demonstrated a well-preserved ejection fraction.  The aortic valve mean gradient was 37 in March 2021.  She lives at [facility].  She walks with a walker and lives in independent living.   Since I last saw her she has done okay.  She appears to have some memory issues that she does not remember much of her conversation previously.  She walks to the dining hall once daily by her report denies any cardiovascular symptoms.  The patient denies any new symptoms such as chest discomfort, neck or arm discomfort. There has been no new shortness of breath, PND or orthopnea. There have been no reported palpitations, presyncope or syncope.    Past Medical History:  Diagnosis Date   Cerebral atrophy ([place]) 01/21/2013   Hyperlipidemia    Hypothyroid    Osteoarthrosis, unspecified whether generalized or localized, unspecified site    Osteoporosis, unspecified    Other and unspecified hyperlipidemia    Other generalized ischemic cerebrovascular disease 01/21/2013   small vessel disease   Syncope and collapse    Umbilical hernia without mention of obstruction or gangrene    Unspecified hearing loss    Unspecified hypothyroidism    Unspecified vitamin D deficiency    Ventral hernia, unspecified, without mention of obstruction or gangrene     Past Surgical History:  Procedure Laterality Date   CATARACT EXTRACTION W/ INTRAOCULAR LENS  IMPLANT, BILATERAL  2012   Dr. Bing Plume   HERNIA REPAIR  01/2008   laparoscopic Dr. [name]   INGUINAL HERNIA REPAIR N/A 05/10/2019    Procedure: LAPAROSCOPIC FEMORAL HERNIA REPAIR WITH MESH;  Surgeon: Ralene Ok, MD;  Location: [place];  Service: General;  Laterality: N/A;   KNEE SURGERY  2008   Dr. Alyssa Grove  left   TOE AMPUTATION  1986   little toe right foot  Dr. Dema Severin; [place]     Current Outpatient Medications  Medication Sig Dispense Refill   acetaminophen (TYLENOL) 500 MG tablet Take 2 tablets (1,000 mg total) by mouth every 6 (six) hours as needed. 30 tablet 0   aspirin 81 MG tablet Take 81 mg by mouth daily.     Calcium Carbonate-Vitamin D 600-200 MG-UNIT TABS Take 1 tablet by mouth 2 (two) times daily.     levothyroxine (SYNTHROID) 75 MCG tablet Take one tablet daily. 90 tablet 1   Multiple Vitamin (MULTIVITAMIN WITH MINERALS) TABS Take 1 tablet by mouth daily.     Omega-3 Fatty Acids (FISH OIL) 1200 MG CAPS Take 1 capsule by mouth daily.     No current facility-administered medications for this visit.    Allergies:   Mushroom extract complex and Penicillins    ROS:  Please see the history of present illness.   Otherwise, review of systems are positive for none.   All other systems are reviewed and negative.    PHYSICAL EXAM: VS:  BP 122/60   Pulse 87   Resp 20   Ht '5\' 1"'$  (1.549  m)   Wt 130 lb (59 kg)   SpO2 97%   BMI 24.56 kg/m  , BMI Body mass index is 24.56 kg/m. GEN:   Slightly frail appearing NECK:  No jugular venous distention at 90 degrees, waveform within normal limits, carotid upstroke brisk and symmetric, no bruits, no thyromegaly LYMPHATICS:  No cervical adenopathy LUNGS:  Clear to auscultation bilaterally BACK:  No CVA tenderness, scoliosis CHEST:  Unremarkable HEART:  S1 and S2 within normal limits, no S3, no S4, no clicks, no rubs, 3 out of 6 apical systolic murmur radiating slightly at the aortic outflow tract, no diastolic murmurs ABD:  Positive bowel sounds normal in frequency in pitch, no bruits, no rebound, no guarding, unable to assess midline mass or bruit with the  patient seated. EXT:  2 plus pulses throughout, no edema, no cyanosis no clubbing SKIN:  No rashes no nodules NEURO:  Cranial nerves II through XII grossly intact, motor grossly intact throughout PSYCH:  Cognitively intact, oriented to person place and time  EKG:  EKG is not ordered today.    Recent Labs: 09/10/2020: ALT 11; BUN 19; Creat 1.09; Hemoglobin 12.5; Platelets 292; Potassium 4.3; Sodium 143; TSH 0.71    Lipid Panel    Component Value Date/Time   CHOL 144 09/10/2020 1046   TRIG 135 09/10/2020 1046   HDL 62 09/10/2020 1046   CHOLHDL 2.3 09/10/2020 1046   LDLCALC 60 09/10/2020 1046      Wt Readings from Last 3 Encounters:  09/26/20 130 lb (59 kg)  09/19/20 129 lb 6.4 oz (58.7 kg)  04/20/20 125 lb 12.8 oz (57.1 kg)      Other studies Reviewed: Additional studies/ records that were reviewed today include:  None  Review of the above records demonstrates:    Please see elsewhere in the note.     ASSESSMENT AND PLAN:  Aortic Stenosis:    This was moderate and she is not having any new symptoms.  I am going to follow this up with an echocardiogram in the spring and I will see her after that.   Current medicines are reviewed at length with the patient today.  The patient does not have concerns regarding medicines.  The following changes have been made:  None  Labs/ tests ordered today include:  None  Orders Placed This Encounter  Procedures   ECHOCARDIOGRAM COMPLETE      Disposition:   FU with me in six months.    Signed, Minus Breeding, MD  09/26/2020 11:33 AM    [facility]

## 2020-09-26 ENCOUNTER — Telehealth: Payer: Self-pay | Admitting: *Deleted

## 2020-09-26 ENCOUNTER — Ambulatory Visit: Payer: PPO | Admitting: Cardiology

## 2020-09-26 ENCOUNTER — Other Ambulatory Visit: Payer: Self-pay

## 2020-09-26 ENCOUNTER — Encounter: Payer: Self-pay | Admitting: Cardiology

## 2020-09-26 VITALS — BP 122/60 | HR 87 | Resp 20 | Ht 61.0 in | Wt 130.0 lb

## 2020-09-26 DIAGNOSIS — I350 Nonrheumatic aortic (valve) stenosis: Secondary | ICD-10-CM

## 2020-09-26 NOTE — Patient Instructions (Signed)
Medication Instructions:  No changes  *If you need a refill on your cardiac medications before your next appointment, please call your pharmacy*   Lab Work: Not needed    Testing/Procedures: Will be schedule in March 2023 at [place] has requested that you have an echocardiogram. Echocardiography is a painless test that uses sound waves to create images of your heart. It provides your doctor with information about the size and shape of your heart and how well your heart's chambers and valves are working. This procedure takes approximately one hour. There are no restrictions for this procedure.    Follow-Up: At [facility], you and your health needs are our priority.  As part of our continuing mission to provide you with exceptional heart care, we have created designated Provider Care Teams.  These Care Teams include your primary Cardiologist (physician) and Advanced Practice Providers (APPs -  Physician Assistants and Nurse Practitioners) who all work together to provide you with the care you need, when you need it.  We recommend signing up for the patient portal called "MyChart".  Sign up information is provided on this After Visit Summary.  MyChart is used to connect with patients for Virtual Visits (Telemedicine).  Patients are able to view lab/test results, encounter notes, upcoming appointments, etc.  Non-urgent messages can be sent to your provider as well.   To learn more about what you can do with MyChart, go to [email or web address].    Your next appointment:   7 to 8 month(s)  The format for your next appointment:   In Person  Provider:   Minus Breeding, MD

## 2020-09-26 NOTE — Telephone Encounter (Signed)
Krista Bowman with [facility] called wanting to know if they can use a different mgf. For patient's Levothyroxine. Stated that she was previously using [facility] and now they have [facility].  Is this ok to switch.  Please Advise.    Direct #:[phone]  [id number]

## 2020-09-27 NOTE — Telephone Encounter (Signed)
Yes okay to switch, lets have her follow up TSH in 8 weeks

## 2020-09-27 NOTE — Telephone Encounter (Signed)
Krista Bowman with [facility] notified and agreed.  Patient has an upcoming appointment.

## 2020-10-11 ENCOUNTER — Ambulatory Visit: Payer: PPO | Admitting: Cardiology

## 2020-11-22 ENCOUNTER — Other Ambulatory Visit: Payer: Self-pay

## 2020-11-22 ENCOUNTER — Other Ambulatory Visit: Payer: PPO

## 2020-11-22 DIAGNOSIS — E782 Mixed hyperlipidemia: Secondary | ICD-10-CM | POA: Diagnosis not present

## 2020-11-23 LAB — LIPID PANEL
Cholesterol: 205 mg/dL — ABNORMAL HIGH (ref <200)
HDL: 65 mg/dL (ref >=50)
LDL Cholesterol (Calc): 110 mg/dL (calc) — ABNORMAL HIGH
Non-HDL Cholesterol (Calc): 140 mg/dL (calc) — ABNORMAL HIGH (ref <130)
Total CHOL/HDL Ratio: 3.2 (calc) (ref <5.0)
Triglycerides: 180 mg/dL — ABNORMAL HIGH (ref <150)

## 2020-11-28 ENCOUNTER — Encounter: Payer: Self-pay | Admitting: Family Medicine

## 2020-11-28 ENCOUNTER — Non-Acute Institutional Stay: Payer: PPO | Admitting: Family Medicine

## 2020-11-28 ENCOUNTER — Other Ambulatory Visit: Payer: Self-pay

## 2020-11-28 VITALS — BP 130/80 | HR 82 | Temp 97.5°F | Ht 61.0 in | Wt 131.2 lb

## 2020-11-28 DIAGNOSIS — R600 Localized edema: Secondary | ICD-10-CM

## 2020-11-28 DIAGNOSIS — N1831 Chronic kidney disease, stage 3a: Secondary | ICD-10-CM | POA: Diagnosis not present

## 2020-11-28 DIAGNOSIS — I350 Nonrheumatic aortic (valve) stenosis: Secondary | ICD-10-CM

## 2020-11-28 DIAGNOSIS — E039 Hypothyroidism, unspecified: Secondary | ICD-10-CM

## 2020-11-30 NOTE — Progress Notes (Signed)
Provider:  [name], MD  Careteam: Patient Care Team: [name], MD as PCP - General (Family Medicine) [name] [name], [name], NP as Nurse Practitioner (Nurse Practitioner) [name], MD as Consulting Physician (Ophthalmology) [name], MD as Consulting Physician (Orthopedic Surgery) [name], [name], MD as Consulting Physician (Dermatology) [name], MD (Inactive) as Consulting Physician (Gastroenterology) [name], MD as Consulting Physician (General Surgery)  PLACE OF SERVICE:  [place]  Advanced Directive information    Allergies  Allergen Reactions   Mushroom Extract Complex Other (See Comments)    "Allergic," per paperwork from facility   Penicillins Diarrhea and Other (See Comments)    "Allergic," per paperwork from facility    Chief Complaint  Patient presents with   Medical Management of Chronic Issues    Patient presents today for a 2 month follow-up.     HPI: Patient is a 87 y.o. female .  Patient is here for routine 64-month follow-up.  She did not denies any new complaints or symptoms since her last visit.  She is trying to eat better.  Weight is stable having gained little over 1 pound since last visit.  She had some pain in her right thigh that was relieved by Tylenol. Memory is stable.  There have been no falls. She does have chronic kidney disease and we stressed the importance of hydration and she appreciates that.  She does have a hard time drinking water and has to supplement water with other liquids such as tea and ginger ale  Review of Systems:  Review of Systems  Constitutional: Negative.   HENT: Negative.    Respiratory: Negative.    Cardiovascular: Negative.   Gastrointestinal: Negative.   Genitourinary: Negative.   Musculoskeletal: Negative.   All other systems reviewed and are negative.  Past Medical History:  Diagnosis Date   Cerebral atrophy ([place]) 01/21/2013   Hyperlipidemia     Hypothyroid    Osteoarthrosis, unspecified whether generalized or localized, unspecified site    Osteoporosis, unspecified    Other and unspecified hyperlipidemia    Other generalized ischemic cerebrovascular disease 01/21/2013   small vessel disease   Syncope and collapse    Umbilical hernia without mention of obstruction or gangrene    Unspecified hearing loss    Unspecified hypothyroidism    Unspecified vitamin D deficiency    Ventral hernia, unspecified, without mention of obstruction or gangrene    Past Surgical History:  Procedure Laterality Date   CATARACT EXTRACTION W/ INTRAOCULAR LENS  IMPLANT, BILATERAL  2012   Dr. Bing Plume   HERNIA REPAIR  01/2008   laparoscopic Dr. [name]   INGUINAL HERNIA REPAIR N/A 05/10/2019   Procedure: LAPAROSCOPIC FEMORAL HERNIA REPAIR WITH MESH;  Surgeon: Ralene Ok, MD;  Location: [place];  Service: General;  Laterality: N/A;   KNEE SURGERY  2008   Dr. Alyssa Grove  left   TOE AMPUTATION  1986   little toe right foot  Dr. Dema Severin; [place]   Social History:   reports that she has never smoked. She has never used smokeless tobacco. She reports that she does not drink alcohol and does not use drugs.  Family History  Problem Relation Age of Onset   Melanoma Sister    Cancer Sister     Medications: Patient's Medications  New Prescriptions   No medications on file  Previous Medications   ACETAMINOPHEN (TYLENOL) 500 MG TABLET    Take 2 tablets (1,000 mg total) by  mouth every 6 (six) hours as needed.   ASPIRIN 81 MG TABLET    Take 81 mg by mouth daily.   CALCIUM CARBONATE-VITAMIN D 600-200 MG-UNIT TABS    Take 1 tablet by mouth 2 (two) times daily.   LEVOTHYROXINE (SYNTHROID) 75 MCG TABLET    Take one tablet daily.   MULTIPLE VITAMIN (MULTIVITAMIN WITH MINERALS) TABS    Take 1 tablet by mouth daily.   OMEGA-3 FATTY ACIDS (FISH OIL) 1200 MG CAPS    Take 1 capsule by mouth daily.  Modified Medications   No medications on file  Discontinued  Medications   No medications on file    Physical Exam:  Vitals:   11/28/20 1627  BP: 130/80  Pulse: 82  Temp: (!) 97.5 F (36.4 C)  SpO2: 98%  Weight: 131 lb 3.2 oz (59.5 kg)  Height: 5\' 1"  (1.549 m)   Body mass index is 24.79 kg/m. Wt Readings from Last 3 Encounters:  11/28/20 131 lb 3.2 oz (59.5 kg)  09/26/20 130 lb (59 kg)  09/19/20 129 lb 6.4 oz (58.7 kg)    Physical Exam Vitals and nursing note reviewed.  Constitutional:      Appearance: Normal appearance. She is normal weight.  Cardiovascular:     Rate and Rhythm: Normal rate and regular rhythm.     Heart sounds: Murmur heard.  Pulmonary:     Effort: Pulmonary effort is normal.     Breath sounds: Normal breath sounds.  Musculoskeletal:        General: Normal range of motion.  Neurological:     General: No focal deficit present.     Mental Status: She is alert and oriented to person, place, and time.    Labs reviewed: Basic Metabolic Panel: Recent Labs    01/31/20 0700 09/10/20 1046  NA 142 143  K 4.6 4.3  CL 107 108  CO2 26 27  GLUCOSE 98 91  BUN 24 19  CREATININE 1.06* 1.09*  CALCIUM 9.4 9.1  TSH 0.90 0.71   Liver Function Tests: Recent Labs    01/31/20 0700 09/10/20 1046  AST 16 14  ALT 11 11  BILITOT 0.5 0.5  PROT 7.0 6.7   No results for input(s): LIPASE, AMYLASE in the last 8760 hours. No results for input(s): AMMONIA in the last 8760 hours. CBC: Recent Labs    01/31/20 0700 09/10/20 1046  WBC 5.9 5.9  NEUTROABS 3,157 3,280  HGB 12.6 12.5  HCT 38.4 38.5  MCV 89.1 91.7  PLT 304 292   Lipid Panel: Recent Labs    01/31/20 0700 09/10/20 1046 11/22/20 0700  CHOL 155 144 205*  HDL 65 62 65  LDLCALC 72 60 110*  TRIG 98 135 180*  CHOLHDL 2.4 2.3 3.2   TSH: Recent Labs    01/31/20 0700 09/10/20 1046  TSH 0.90 0.71   A1C: Lab Results  Component Value Date   HGBA1C 5.9 (H) 01/31/2020     Assessment/Plan  1. Aortic valve stenosis, etiology of cardiac valve  disease unspecified Asymptomatic, as far as I can tell.  No history of failure syncope palpitations.  2. Bilateral leg edema There is no edema today.  3. Stage 3a chronic kidney disease (HCC) BUN and creatinine are stable.  Continue hydrating   4. Hypothyroidism, unspecified type TSH from 2 months ago was in the therapeutic range.  Continue same dose of thyroid supplement   [name], MD [place] [phone]

## 2021-01-30 ENCOUNTER — Encounter: Payer: Self-pay | Admitting: Family Medicine

## 2021-01-30 ENCOUNTER — Other Ambulatory Visit: Payer: Self-pay

## 2021-01-30 ENCOUNTER — Non-Acute Institutional Stay: Payer: PPO | Admitting: Family Medicine

## 2021-01-30 VITALS — BP 124/80 | HR 87 | Temp 98.2°F | Ht 61.0 in | Wt 135.6 lb

## 2021-01-30 DIAGNOSIS — M545 Low back pain, unspecified: Secondary | ICD-10-CM

## 2021-01-30 MED ORDER — TRAMADOL HCL 50 MG PO TABS: 50.0000 mg | ORAL_TABLET | Freq: Three times a day (TID) | ORAL | 0 refills | Status: AC | PRN

## 2021-01-30 NOTE — Progress Notes (Signed)
Provider:  [name], MD  Careteam: Patient Care Team: [name], MD as PCP - General (Family Medicine) [name], [name], NP as Nurse Practitioner (Nurse Practitioner) [name], MD as Consulting Physician (Ophthalmology) [name], MD as Consulting Physician (Orthopedic Surgery) [name], [name], MD as Consulting Physician (Dermatology) [name], MD (Inactive) as Consulting Physician (Gastroenterology) [name], MD as Consulting Physician (General Surgery)  PLACE OF SERVICE:  [place]  Advanced Directive information    Allergies  Allergen Reactions   Mushroom Extract Complex Other (See Comments)    "Allergic," per paperwork from facility   Penicillins Diarrhea and Other (See Comments)    "Allergic," per paperwork from facility    Chief Complaint  Patient presents with   Acute Visit    Patient presents today for back pain and left hip/ leg pain since Monday,01/28/21. She reports taking tylenol every 7 hours for the pain.     HPI: Patient is a 88 y.o. Bowman presents with acute for 3-4 days left low back pain, hip pain radiating to her knee.No injury. Worse laying down and upon arising. Taking Tylenol without relief Past hx shows compression and degenerative disease, but she does not recall.  Review of Systems:  Review of Systems  Constitutional: Negative.   Cardiovascular: Negative.   Gastrointestinal: Negative.   Genitourinary: Negative.   Musculoskeletal:  Positive for back pain.  All other systems reviewed and are negative.  Past Medical History:  Diagnosis Date   Cerebral atrophy ([place]) 01/21/2013   Hyperlipidemia    Hypothyroid    Osteoarthrosis, unspecified whether generalized or localized, unspecified site    Osteoporosis, unspecified    Other and unspecified hyperlipidemia    Other generalized ischemic cerebrovascular disease 01/21/2013   small vessel disease   Syncope and collapse    Umbilical hernia  without mention of obstruction or gangrene    Unspecified hearing loss    Unspecified hypothyroidism    Unspecified vitamin D deficiency    Ventral hernia, unspecified, without mention of obstruction or gangrene    Past Surgical History:  Procedure Laterality Date   CATARACT EXTRACTION W/ INTRAOCULAR LENS  IMPLANT, BILATERAL  2012   Dr. Bing Plume   HERNIA REPAIR  01/2008   laparoscopic Dr. [name]   INGUINAL HERNIA REPAIR N/A 05/10/2019   Procedure: LAPAROSCOPIC FEMORAL HERNIA REPAIR WITH MESH;  Surgeon: Ralene Ok, MD;  Location: [place];  Service: General;  Laterality: N/A;   KNEE SURGERY  2008   Dr. Alyssa Grove  left   TOE AMPUTATION  1986   little toe right foot  Dr. Dema Severin; [place]   Social History:   reports that she has never smoked. She has never used smokeless tobacco. She reports that she does not drink alcohol and does not use drugs.  Family History  Problem Relation Age of Onset   Melanoma Sister    Cancer Sister     Medications: Patient's Medications  New Prescriptions   TRAMADOL (ULTRAM) 50 MG TABLET    Take 1 tablet (50 mg total) by mouth every 8 (eight) hours as needed for up to 5 days for moderate pain (Take at bedtime as needed).  Previous Medications   ACETAMINOPHEN (TYLENOL) 500 MG TABLET    Take 2 tablets (1,000 mg total) by mouth every 6 (six) hours as needed.   ASPIRIN 81 MG TABLET    Take 81 mg by mouth daily.   CALCIUM CARBONATE-VITAMIN D 600-200 MG-UNIT TABS  Take 1 tablet by mouth 2 (two) times daily.   LEVOTHYROXINE (SYNTHROID) 75 MCG TABLET    Take one tablet daily.   MULTIPLE VITAMIN (MULTIVITAMIN WITH MINERALS) TABS    Take 1 tablet by mouth daily.   OMEGA-3 FATTY ACIDS (FISH OIL) 1200 MG CAPS    Take 1 capsule by mouth daily.  Modified Medications   No medications on file  Discontinued Medications   No medications on file    Physical Exam:  Vitals:   01/30/21 1341  BP: 124/80  Pulse: 87  Temp: 98.2 F (36.8 C)  SpO2: 98%   Weight: 135 lb 9.6 oz (61.5 kg)  Height: 5\' 1"  (1.549 m)   Body mass index is 25.62 kg/m. Wt Readings from Last 3 Encounters:  01/30/21 135 lb 9.6 oz (61.5 kg)  11/28/20 131 lb 3.2 oz (59.5 kg)  09/26/20 130 lb (59 kg)    Physical Exam Vitals and nursing note reviewed.  Constitutional:      Appearance: Normal appearance.  Musculoskeletal:     Comments: Back: decreased ROM in all planes due to pain SLR negative DTR's symmetric There is tenderness at greater trochanter  Neurological:     Mental Status: She is alert.    Labs reviewed: Basic Metabolic Panel: Recent Labs    09/10/20 1046  NA 143  K 4.3  CL 108  CO2 27  GLUCOSE 91  BUN 19  CREATININE 1.09*  CALCIUM 9.1  TSH 0.71   Liver Function Tests: Recent Labs    09/10/20 1046  AST 14  ALT 11  BILITOT 0.5  PROT 6.7   No results for input(s): LIPASE, AMYLASE in the last 8760 hours. No results for input(s): AMMONIA in the last 8760 hours. CBC: Recent Labs    09/10/20 1046  WBC 5.9  NEUTROABS 3,280  HGB 12.5  HCT Krista.5  MCV 91.7  PLT 292   Lipid Panel: Recent Labs    09/10/20 1046 11/22/20 0700  CHOL 144 205*  HDL 62 65  LDLCALC 60 110*  TRIG 135 180*  CHOLHDL 2.3 3.2   TSH: Recent Labs    09/10/20 1046  TSH 0.71   A1C: Lab Results  Component Value Date   HGBA1C 5.9 (H) 01/31/2020     Assessment/Plan  1. Acute left-sided low back pain without sciatica Review of history finds that back discomfort has been present intermittently for some years. Also histiry of compression fx in 2018.  With acute onset and pain unrelieved by tylenol, will get LS spine film to R/O another fracture and give small Rx for ultram with caution to not take if it causes too much drowsiness   [name], MD [place] [phone]

## 2021-02-05 DIAGNOSIS — L602 Onychogryphosis: Secondary | ICD-10-CM | POA: Diagnosis not present

## 2021-02-05 DIAGNOSIS — M216X1 Other acquired deformities of right foot: Secondary | ICD-10-CM | POA: Diagnosis not present

## 2021-02-05 DIAGNOSIS — L84 Corns and callosities: Secondary | ICD-10-CM | POA: Diagnosis not present

## 2021-02-08 ENCOUNTER — Other Ambulatory Visit: Payer: Self-pay

## 2021-02-08 MED ORDER — TRAMADOL HCL 50 MG PO TABS: 50.0000 mg | ORAL_TABLET | Freq: Three times a day (TID) | ORAL | 0 refills | Status: AC | PRN

## 2021-02-08 NOTE — Telephone Encounter (Signed)
Helene Kelp at [facility] called stating that patient needs refill on Tramadol 50mg  Take one tablet every 8 hours as needed. Dr. Sabra Heck filled prescription 01/30/2021 for 15 tablets for patient to take medication for up to 5 days.    Medication pended and sent to Dinah Ngetich,NP approval/refusal.

## 2021-02-13 ENCOUNTER — Other Ambulatory Visit: Payer: Self-pay | Admitting: Family Medicine

## 2021-02-15 ENCOUNTER — Other Ambulatory Visit: Payer: Self-pay

## 2021-02-15 ENCOUNTER — Emergency Department (HOSPITAL_COMMUNITY): Payer: PPO

## 2021-02-15 ENCOUNTER — Emergency Department (HOSPITAL_COMMUNITY)
Admission: EM | Admit: 2021-02-15 | Discharge: 2021-02-16 | Disposition: A | Discharge status: Home / Self Care | Payer: PPO | Attending: Emergency Medicine | Admitting: Emergency Medicine

## 2021-02-15 DIAGNOSIS — W010XXA Fall on same level from slipping, tripping and stumbling without subsequent striking against object, initial encounter: Secondary | ICD-10-CM | POA: Diagnosis not present

## 2021-02-15 DIAGNOSIS — M549 Dorsalgia, unspecified: Secondary | ICD-10-CM | POA: Diagnosis not present

## 2021-02-15 DIAGNOSIS — R4182 Altered mental status, unspecified: Secondary | ICD-10-CM | POA: Insufficient documentation

## 2021-02-15 DIAGNOSIS — S0990XA Unspecified injury of head, initial encounter: Secondary | ICD-10-CM | POA: Diagnosis present

## 2021-02-15 DIAGNOSIS — I10 Essential (primary) hypertension: Secondary | ICD-10-CM | POA: Diagnosis not present

## 2021-02-15 DIAGNOSIS — Z7982 Long term (current) use of aspirin: Secondary | ICD-10-CM | POA: Diagnosis not present

## 2021-02-15 DIAGNOSIS — S0003XA Contusion of scalp, initial encounter: Secondary | ICD-10-CM | POA: Diagnosis not present

## 2021-02-15 DIAGNOSIS — R749 Abnormal serum enzyme level, unspecified: Secondary | ICD-10-CM | POA: Insufficient documentation

## 2021-02-15 DIAGNOSIS — R748 Abnormal levels of other serum enzymes: Secondary | ICD-10-CM | POA: Diagnosis not present

## 2021-02-15 DIAGNOSIS — Z79899 Other long term (current) drug therapy: Secondary | ICD-10-CM | POA: Insufficient documentation

## 2021-02-15 DIAGNOSIS — S4991XA Unspecified injury of right shoulder and upper arm, initial encounter: Secondary | ICD-10-CM | POA: Diagnosis not present

## 2021-02-15 DIAGNOSIS — S0093XA Contusion of unspecified part of head, initial encounter: Secondary | ICD-10-CM | POA: Diagnosis not present

## 2021-02-15 DIAGNOSIS — R2681 Unsteadiness on feet: Secondary | ICD-10-CM | POA: Insufficient documentation

## 2021-02-15 DIAGNOSIS — R9431 Abnormal electrocardiogram [ECG] [EKG]: Secondary | ICD-10-CM | POA: Diagnosis not present

## 2021-02-15 DIAGNOSIS — M1611 Unilateral primary osteoarthritis, right hip: Secondary | ICD-10-CM | POA: Diagnosis not present

## 2021-02-15 DIAGNOSIS — R410 Disorientation, unspecified: Secondary | ICD-10-CM | POA: Diagnosis not present

## 2021-02-15 DIAGNOSIS — S79911A Unspecified injury of right hip, initial encounter: Secondary | ICD-10-CM | POA: Diagnosis not present

## 2021-02-15 LAB — CBC
HCT: 39.3 % (ref 36.0–46.0)
Hemoglobin: 13.4 g/dL (ref 12.0–15.0)
MCH: 30.7 pg (ref 26.0–34.0)
MCHC: 34.1 g/dL (ref 30.0–36.0)
MCV: 89.9 fL (ref 80.0–100.0)
Platelets: 408 10*3/uL — ABNORMAL HIGH (ref 150–400)
RBC: 4.37 MIL/uL (ref 3.87–5.11)
RDW: 13.4 % (ref 11.5–15.5)
WBC: 10.4 10*3/uL (ref 4.0–10.5)
nRBC: 0 % (ref 0.0–0.2)

## 2021-02-15 LAB — URINALYSIS, ROUTINE W REFLEX MICROSCOPIC
Bilirubin Urine: NEGATIVE
Glucose, UA: NEGATIVE mg/dL
Ketones, ur: 80 mg/dL — AB
Leukocytes,Ua: NEGATIVE
Nitrite: NEGATIVE
Protein, ur: 100 mg/dL — AB
Specific Gravity, Urine: 1.015 (ref 1.005–1.030)
pH: 5 (ref 5.0–8.0)

## 2021-02-15 LAB — COMPREHENSIVE METABOLIC PANEL
ALT: 29 U/L (ref 0–44)
AST: 74 U/L — ABNORMAL HIGH (ref 15–41)
Albumin: 3.9 g/dL (ref 3.5–5.0)
Alkaline Phosphatase: 84 U/L (ref 38–126)
Anion gap: 11 (ref 5–15)
BUN: 19 mg/dL (ref 8–23)
CO2: 24 mmol/L (ref 22–32)
Calcium: 9.1 mg/dL (ref 8.9–10.3)
Chloride: 102 mmol/L (ref 98–111)
Creatinine, Ser: 1.07 mg/dL — ABNORMAL HIGH (ref 0.44–1.00)
GFR, Estimated: 50 mL/min — ABNORMAL LOW (ref >60)
Glucose, Bld: 122 mg/dL — ABNORMAL HIGH (ref 70–99)
Potassium: 3.7 mmol/L (ref 3.5–5.1)
Sodium: 137 mmol/L (ref 135–145)
Total Bilirubin: 0.7 mg/dL (ref 0.3–1.2)
Total Protein: 8.3 g/dL — ABNORMAL HIGH (ref 6.5–8.1)

## 2021-02-15 LAB — CK
Total CK: 3051 U/L — ABNORMAL HIGH (ref 38–234)
Total CK: 2795 U/L — ABNORMAL HIGH (ref 38–234)

## 2021-02-15 MED ORDER — OMEGA-3-ACID ETHYL ESTERS 1 G PO CAPS
1.0000 g | ORAL_CAPSULE | Freq: Every day | ORAL | Status: DC
  Filled 2021-02-15: qty 1

## 2021-02-15 MED ORDER — LACTATED RINGERS IV BOLUS
1000.0000 mL | Freq: Once | INTRAVENOUS | Status: AC
  Administered 2021-02-15: 1000 mL via INTRAVENOUS

## 2021-02-15 MED ORDER — ASPIRIN EC 81 MG PO TBEC: 81.0000 mg | DELAYED_RELEASE_TABLET | Freq: Every day | ORAL | Status: DC

## 2021-02-15 MED ORDER — ADULT MULTIVITAMIN W/MINERALS CH: 1.0000 | ORAL_TABLET | Freq: Every day | ORAL | Status: DC

## 2021-02-15 MED ORDER — LEVOTHYROXINE SODIUM 50 MCG PO TABS
75.0000 ug | ORAL_TABLET | Freq: Every day | ORAL | Status: DC
  Administered 2021-02-16: 75 ug via ORAL
  Filled 2021-02-15: qty 1

## 2021-02-15 NOTE — Discharge Instructions (Addendum)
It was our pleasure to provide your ER care today - we hope that you feel better.  Drink plenty of fluids/stay well hydrated.   Fall precautions.  Return to ER if worse, new symptoms, fevers, chest pain, trouble breathing, new/severe pain, or other concern.

## 2021-02-15 NOTE — ED Notes (Signed)
Patient weak and unable to ambulate safely. Patient will need higher level of care. MD made aware. [facility] called to see if we can place her in the assisted living side.

## 2021-02-15 NOTE — ED Triage Notes (Signed)
Patient BIBA from [facility]. EMS reports AMS for 1 day. Was found on floor this AM, unknown time down, no obvious injuries or complaints of pain. Decreased intake for several days.  AOx3 (not oriented to time)  Hx alzheimer's.  BP: 158/98 HR: 84 RR: 22 SPO2: 96 RA Temp: 98.65f CBG: 135

## 2021-02-15 NOTE — Progress Notes (Signed)
CSW spoke with Harle Stanford, LPN who states there are no beds available in the skilled side at [facility] at this time.  Patient has [employer] which will require prior authorization for skilled services and transportation. HTA offices are closed for the night and TOC staff will have to contact the agency tomorrow to obtain authorizations.  Patient will need to be evaluated by PT prior to authorizations being submitted - MD made aware.   Madilyn Fireman, MSW, LCSW Transitions of Care   Clinical Social Worker II [phone]

## 2021-02-15 NOTE — ED Provider Notes (Signed)
[facility] DEPT Provider Note   CSN: [id number] Arrival date & time: 02/15/21  1223     History  Chief Complaint  Patient presents with   Altered Mental Status    Krista Bowman is a 88 y.o. female.  Patient presents via EMS s/p fall at SNF. Unknown down time. Patient doesn't recall fall, reported hx mild dementia. Pt denies pain/injury - is noted with some pain w rom right shoulder and right hip. No antioag use. Denies headache. No neck or back pain. No chest pain or sob. No abd pain or nv. No dysuria or gu c/o. Denies fever, chills, sweats.   The history is provided by the patient, medical records and the EMS personnel. The history is limited by the condition of the patient.  Altered Mental Status Associated symptoms: no abdominal pain, no agitation, no fever, no headaches and no vomiting       Home Medications Prior to Admission medications   Medication Sig Start Date End Date Taking? Authorizing Provider  acetaminophen (TYLENOL) 500 MG tablet Take 2 tablets (1,000 mg total) by mouth every 6 (six) hours as needed. 05/11/19   Saverio Danker, PA-C  aspirin 81 MG tablet Take 81 mg by mouth daily.    [provider]  Calcium Carbonate-Vitamin D 600-200 MG-UNIT TABS Take 1 tablet by mouth 2 (two) times daily.    [provider]  levothyroxine (SYNTHROID) 75 MCG tablet Take one tablet daily. 09/19/20   [name], MD  Multiple Vitamin (MULTIVITAMIN WITH MINERALS) TABS Take 1 tablet by mouth daily.    [provider]  Omega-3 Fatty Acids (FISH OIL) 1200 MG CAPS Take 1 capsule by mouth daily.    [provider]      Allergies    Mushroom extract complex and Penicillins    Review of Systems   Review of Systems  Constitutional:  Negative for chills and fever.  HENT:  Negative for sore throat.   Eyes:  Negative for pain, redness and visual disturbance.  Respiratory:  Negative for cough and shortness of breath.    Cardiovascular:  Negative for chest pain.  Gastrointestinal:  Negative for abdominal pain, diarrhea and vomiting.  Genitourinary:  Negative for dysuria and flank pain.  Musculoskeletal:  Negative for back pain and neck pain.  Skin:  Negative for wound.  Neurological:  Negative for speech difficulty and headaches.  Hematological:  Does not bruise/bleed easily.  Psychiatric/Behavioral:  Negative for agitation.    Physical Exam Updated Vital Signs BP (!) 150/77 (BP Location: Right Arm)    Pulse 85    Temp 97.7 F (36.5 C) (Oral)    Resp 20    SpO2 98%  Physical Exam Vitals and nursing note reviewed.  Constitutional:      Appearance: Normal appearance. She is well-developed.  HENT:     Head:     Comments: Minimal contusion forehead/scalp.    Nose: Nose normal.     Mouth/Throat:     Mouth: Mucous membranes are moist.  Eyes:     General: No scleral icterus.    Conjunctiva/sclera: Conjunctivae normal.     Pupils: Pupils are equal, round, and reactive to light.  Neck:     Vascular: No carotid bruit.     Trachea: No tracheal deviation.  Cardiovascular:     Rate and Rhythm: Normal rate and regular rhythm.     Pulses: Normal pulses.     Heart sounds: Murmur heard.    No  friction rub. No gallop.  Pulmonary:     Effort: Pulmonary effort is normal. No respiratory distress.     Breath sounds: Normal breath sounds.  Chest:     Chest wall: No tenderness.  Abdominal:     General: Bowel sounds are normal. There is no distension.     Palpations: Abdomen is soft.     Tenderness: There is no abdominal tenderness. There is no guarding.  Genitourinary:    Comments: No cva tenderness.  Musculoskeletal:        General: No swelling or tenderness.     Cervical back: Normal range of motion and neck supple. No rigidity or tenderness. No muscular tenderness.     Comments: Mild tenderness right shoulder and right hip, otherwise, good rom bil extremities without pain or focal bony tenderness.   CTLS spine, non tender, aligned, no step off.   Skin:    General: Skin is warm and dry.     Findings: No rash.  Neurological:     Mental Status: She is alert.     Comments: Alert, speech normal. Motor/sens grossly intact bil. Oriented to person/place.  GCS 15.  Psychiatric:        Mood and Affect: Mood normal.    ED Results / Procedures / Treatments   Labs (all labs ordered are listed, but only abnormal results are displayed) Results for orders placed or performed during the hospital encounter of 02/15/21  CBC  Result Value Ref Range   WBC 10.4 4.0 - 10.5 K/uL   RBC 4.37 3.87 - 5.11 MIL/uL   Hemoglobin 13.4 12.0 - 15.0 g/dL   HCT 39.3 36.0 - 46.0 %   MCV 89.9 80.0 - 100.0 fL   MCH 30.7 26.0 - 34.0 pg   MCHC 34.1 30.0 - 36.0 g/dL   RDW 13.4 11.5 - 15.5 %   Platelets 408 (H) 150 - 400 K/uL   nRBC 0.0 0.0 - 0.2 %  Comprehensive metabolic panel  Result Value Ref Range   Sodium 137 135 - 145 mmol/L   Potassium 3.7 3.5 - 5.1 mmol/L   Chloride 102 98 - 111 mmol/L   CO2 24 22 - 32 mmol/L   Glucose, Bld 122 (H) 70 - 99 mg/dL   BUN 19 8 - 23 mg/dL   Creatinine, Ser 1.07 (H) 0.44 - 1.00 mg/dL   Calcium 9.1 8.9 - 10.3 mg/dL   Total Protein 8.3 (H) 6.5 - 8.1 g/dL   Albumin 3.9 3.5 - 5.0 g/dL   AST 74 (H) 15 - 41 U/L   ALT 29 0 - 44 U/L   Alkaline Phosphatase 84 38 - 126 U/L   Total Bilirubin 0.7 0.3 - 1.2 mg/dL   GFR, Estimated 50 (L) >60 mL/min   Anion gap 11 5 - 15  CK  Result Value Ref Range   Total CK 3,051 (H) 38 - 234 U/L     EKG None  Radiology DG Shoulder Right  Result Date: 02/15/2021 CLINICAL DATA:  Trauma, fall, pain EXAM: RIGHT SHOULDER - 2+ VIEW COMPARISON:  None. FINDINGS: No recent fracture or dislocation is seen. Bony spurs seen in the right AC joint. IMPRESSION: No recent fracture or dislocation is seen in the right shoulder. Electronically Signed   By: Elmer Picker M.D.   On: 02/15/2021 13:52   CT Head Wo Contrast  Result Date:  02/15/2021 CLINICAL DATA:  Altered mental status, found down EXAM: CT HEAD WITHOUT CONTRAST TECHNIQUE: Contiguous axial images were obtained  from the base of the skull through the vertex without intravenous contrast. RADIATION DOSE REDUCTION: This exam was performed according to the departmental dose-optimization program which includes automated exposure control, adjustment of the mA and/or kV according to patient size and/or use of iterative reconstruction technique. COMPARISON:  07/27/2019 FINDINGS: Brain: Mild atrophy. No evidence of acute infarction, hemorrhage, hydrocephalus, extra-axial collection or mass lesion/mass effect. Vascular: Atherosclerotic and physiologic intracranial calcifications. Skull: Normal. Negative for fracture or focal lesion. Sinuses/Orbits: No acute finding. Other: Bilateral TMJ DJD, right worse than left. Multiple dental restorations. IMPRESSION: Negative for bleed or other acute intracranial process. Electronically Signed   By: Lucrezia Europe M.D.   On: 02/15/2021 14:02   DG HIP UNILAT W OR W/O PELVIS 2-3 VIEWS RIGHT  Result Date: 02/15/2021 CLINICAL DATA:  Trauma, fall, pain EXAM: DG HIP (WITH OR WITHOUT PELVIS) 2-3V RIGHT COMPARISON:  07/27/2019 FINDINGS: No recent fracture or dislocation is seen. Degenerative changes are noted in both hips, more severe on the right side. There is evidence of previous ventral hernia repair. IMPRESSION: No recent fracture or dislocation is seen. Degenerative changes are noted in both hips, more severe on the right side. Electronically Signed   By: Elmer Picker M.D.   On: 02/15/2021 13:53    Procedures Procedures    Medications Ordered in ED Medications - No data to display  ED Course/ Medical Decision Making/ A&P                           Medical Decision Making Problems Addressed: Contusion of head, initial encounter: acute illness or injury Elevated CK: acute illness or injury that poses a threat to life or bodily  functions Fall from slip, trip, or stumble, initial encounter: acute illness or injury  Amount and/or Complexity of Data Reviewed Independent Historian: EMS    Details: additional hx from facility and ems External Data Reviewed: labs, radiology and notes. Labs: ordered. Decision-making details documented in ED Course. Radiology: ordered and independent interpretation performed. Decision-making details documented in ED Course. ECG/medicine tests: ordered and independent interpretation performed. Decision-making details documented in ED Course.  Risk OTC drugs. Decision regarding hospitalization.   Iv ns. Continuous pulse ox and cardiac monitoring. Stat labs. Imaging.  Given fall/weakness, considered admission to hospital as potential disposition - will get labs, imaging, and reassess patient.   Reviewed nursing notes and prior charts for additional history. External reports reviewed. Additional hx by EMS.    Labs reviewed/interpreted by me - ck elevated. Lr bolus. Po fluids.   CT reviewed/interpreted by me - no hem.   Xrays reviewed/interpreted by me - no fx.   2337, pt alert, content, no distress. UA pending. Signed out to Dr Zenia Resides to check labs/UA, and dispo appropriately.            Final Clinical Impression(s) / ED Diagnoses Final diagnoses:  None    Rx / DC Orders ED Discharge Orders     None         Lajean Saver, MD 02/15/21 1538

## 2021-02-15 NOTE — ED Notes (Signed)
Pt states she needs to have a BM, pt placed on bedpan

## 2021-02-15 NOTE — ED Notes (Signed)
Patient given dinner tray.

## 2021-02-15 NOTE — ED Notes (Signed)
Patient c/o pain in right shoulder

## 2021-02-15 NOTE — ED Provider Notes (Addendum)
Patient signed to me by Dr. Ashok Cordia.  Urinalysis negative for infection does show hematuria.  Repeat CK is trending downwards.  She is well-appearing and eating a meal and will be discharged home  7:53 PM Patient was unsteady on her feet at discharge.  Patient will require higher level of care.  TOC involved and they are working on placement   Krista Leigh, MD 02/15/21 1805    Krista Leigh, MD 02/15/21 1954

## 2021-02-16 NOTE — Evaluation (Signed)
Physical Therapy Evaluation Patient Details Name: Krista Bowman MRN: [id number] DOB: 07-Oct-1931 Today's Date: 02/16/2021  History of Present Illness  88 y.o from [place] was found on the floor. Pt has complaints of soreness in L hip and leg as well as Right shoulder, and noted in chart pt had back pain radiating down her leg reported back earlier this month.  Clinical Impression  Pleasant 88 year old, hard of hearing  but understands better with clear loud words and without a mask. Pt aware she has been declining a little and is aware she needs more help. Pt presents with weakness and balance issues requiring assistance for mobility at this time. [place] pt will benefit and progress with PT and OT in a rehab setting with long term goal of either returning back to her independent apartment or assisted living level. Will continue to follow while her in hospital.        Recommendations for follow up therapy are one component of a multi-disciplinary discharge planning process, led by the attending physician.  Recommendations may be updated based on patient status, additional functional criteria and insurance authorization.  Follow Up Recommendations Skilled nursing-short term rehab (<3 hours/day)    Assistance Recommended at Discharge Frequent or constant Supervision/Assistance  Patient can return home with the following  A little help with walking and/or transfers;A little help with bathing/dressing/bathroom    Equipment Recommendations None recommended by PT  Recommendations for Other Services       Functional Status Assessment Patient has had a recent decline in their functional status and demonstrates the ability to make significant improvements in function in a reasonable and predictable amount of time.     Precautions / Restrictions Precautions Precautions: Fall      Mobility  Bed Mobility Overal bed mobility: Needs Assistance Bed Mobility: Supine  to Sit, Sit to Supine     Supine to sit: Mod assist Sit to supine: Mod assist   General bed mobility comments: difficult due to stretcher, however pt still having difficulties independently moving her LEs off and back on teh bed without full assistance today    Transfers Overall transfer level: Needs assistance Equipment used: Rolling walker (2 wheels) Transfers: Sit to/from Stand Sit to Stand: Min assist           General transfer comment: cues for safety and use of RW, as well as steady assistance upon rising for balance to turn and transfer    Ambulation/Gait Ambulation/Gait assistance: Min assist Gait Distance (Feet): 10 Feet Assistive device: Rolling walker (2 wheels) Gait Pattern/deviations: Step-to pattern       General Gait Details: small steps but limited inside of ED room due to in ability to go far from [facility] becaseu pt was having constant need to sit on toilet. After standing to completed toileting , pt had to sit back on the [facility] 4 more times due to diarrhea. Depends reaplied once back in bed due to pt request.  Stairs            Wheelchair Mobility    Modified Rankin (Stroke Patients Only)       Balance Overall balance assessment: Needs assistance Sitting-balance support: Feet supported Sitting balance-Leahy Scale: Good     Standing balance support: Bilateral upper extremity supported, During functional activity Standing balance-Leahy Scale: Fair  Pertinent Vitals/Pain Pain Assessment Pain Assessment: Faces Faces Pain Scale: Hurts little more Pain Location: R hip with skin tear and Left hip and leg when trying to perfomr hip flexion (applied meplix to R hip skin tear) Pain Descriptors / Indicators: Sore Pain Intervention(s): Monitored during session, Limited activity within patient's tolerance    Home Living Family/patient expects to be discharged to:: Skilled nursing facility                         Prior Function Prior Level of Function : Independent/Modified Independent             Mobility Comments: per pt she stated she was from [facility] and did "most" of things by herself and used a walker but can't find it (?) pt is poor historian at this time       Hand Dominance        Extremity/Trunk Assessment        Lower Extremity Assessment Lower Extremity Assessment: Generalized weakness (pt with less than 3/5 for movment out of bed, however was a able to stand without lifting her to stand and hold her own weight during trasnfers and gait. She was shaking UEs while on BSC due to fatigue)    Cervical / Trunk Assessment Cervical / Trunk Assessment: Kyphotic  Communication   Communication: HOH (wears hearing aides)  Cognition Arousal/Alertness: Awake/alert Behavior During Therapy: WFL for tasks assessed/performed Overall Cognitive Status: No family/caregiver present to determine baseline cognitive functioning                                 General Comments: Pt was able to state things from where she lived and that she was thinking she needed a higher level of care at [facility], however confused about where she was currently and how she got her and why.        General Comments      Exercises     Assessment/Plan    PT Assessment Patient needs continued PT services  PT Problem List Decreased strength;Decreased range of motion;Decreased activity tolerance;Decreased balance;Decreased mobility       PT Treatment Interventions Gait training;Functional mobility training;Therapeutic activities;Therapeutic exercise;Balance training;Patient/family education    PT Goals (Current goals can be found in the Care Plan section)  Acute Rehab PT Goals Patient Stated Goal: I want to feel better and I need more help PT Goal Formulation: With patient Time For Goal Achievement: 03/01/21 Potential to Achieve Goals: Good    Frequency Min  3X/week     Co-evaluation               AM-PAC PT "6 Clicks" Mobility  Outcome Measure Help needed turning from your back to your side while in a flat bed without using bedrails?: A Little Help needed moving from lying on your back to sitting on the side of a flat bed without using bedrails?: A Little Help needed moving to and from a bed to a chair (including a wheelchair)?: A Lot Help needed standing up from a chair using your arms (e.g., wheelchair or bedside chair)?: A Little Help needed to walk in hospital room?: A Lot Help needed climbing 3-5 steps with a railing? : A Lot 6 Click Score: 15    End of Session   Activity Tolerance: Patient limited by fatigue Patient left: in bed;with call bell/phone within reach Nurse Communication: Mobility status  PT Visit Diagnosis: Unsteadiness on feet (R26.81);Muscle weakness (generalized) (M62.81)    Time: 1000-1038 PT Time Calculation (min) (ACUTE ONLY): 38 min   Charges:   PT Evaluation $PT Eval Low Complexity: 1 Low PT Treatments $Therapeutic Activity: 8-22 mins        Aoife Bold, PT, MPT Acute Rehabilitation Services Office: [phone] Pager: [phone] 02/16/2021   Clide Dales 02/16/2021, 11:41 AM

## 2021-02-16 NOTE — Progress Notes (Addendum)
TOC CSW attempted to call [employer] to start Auth no response , Left HIPPA VM requesting a return call.   Adden 9:47am Pt is still waiting on PT eval at this time.   Arlie Solomons.Kardell Virgil, MSW, [place]   Transitions of Care Clinical Social Worker I Direct Dial: [phone]   Fax: [phone] [email or web address]

## 2021-02-16 NOTE — ED Provider Notes (Signed)
Emergency Medicine Observation Re-evaluation Note  Krista Bowman is a 88 y.o. female, seen on rounds today.  Pt initially presented to the ED for complaints of Altered Mental Status Currently, the patient is sleeping.  Physical Exam  BP (!) 124/54    Pulse 80    Temp 98.1 F (36.7 C) (Oral)    Resp 18    SpO2 94%  Physical Exam General: Calm, nondistressed Cardiac: Regular rate and rhythm Lungs: Breathing even and unlabored Psych: Deferred  ED Course / MDM  EKG:EKG Interpretation  Date/Time:  Friday February 15 2021 14:56:44 EST Ventricular Rate:  89 PR Interval:  228 QRS Duration: 92 QT Interval:  387 QTC Calculation: 471 R Axis:   40 Text Interpretation: Sinus rhythm Prolonged PR interval Confirmed by Lacretia Leigh ([id number]) on 02/15/2021 5:06:59 PM  I have reviewed the labs performed to date as well as medications administered while in observation.  Recent changes in the last 24 hours include none.  Plan  Current plan is for social placement in skilled nursing facility.  Krista Bowman is not under involuntary commitment.     Godfrey Pick, MD 02/16/21 [phone]

## 2021-02-16 NOTE — ED Notes (Signed)
Pt is alert this am, found trying to climb out of bed; pt confused and does not know where she is or how she got here; this transcriber tried multiple times to tell pt where she but pt does not understand; pt had incontinence of bowel and pt cleaned and new chuck and depend placed on pt;

## 2021-02-18 ENCOUNTER — Encounter: Payer: Self-pay | Admitting: Nurse Practitioner

## 2021-02-18 ENCOUNTER — Non-Acute Institutional Stay: Payer: PPO | Admitting: Nurse Practitioner

## 2021-02-18 DIAGNOSIS — E785 Hyperlipidemia, unspecified: Secondary | ICD-10-CM | POA: Diagnosis not present

## 2021-02-18 DIAGNOSIS — R269 Unspecified abnormalities of gait and mobility: Secondary | ICD-10-CM

## 2021-02-18 DIAGNOSIS — F0394 Unspecified dementia, unspecified severity, with anxiety: Secondary | ICD-10-CM | POA: Insufficient documentation

## 2021-02-18 DIAGNOSIS — F03A0 Unspecified dementia, mild, without behavioral disturbance, psychotic disturbance, mood disturbance, and anxiety: Secondary | ICD-10-CM

## 2021-02-18 DIAGNOSIS — E039 Hypothyroidism, unspecified: Secondary | ICD-10-CM

## 2021-02-18 DIAGNOSIS — R197 Diarrhea, unspecified: Secondary | ICD-10-CM | POA: Insufficient documentation

## 2021-02-18 DIAGNOSIS — M159 Polyosteoarthritis, unspecified: Secondary | ICD-10-CM

## 2021-02-18 DIAGNOSIS — F0390 Unspecified dementia without behavioral disturbance: Secondary | ICD-10-CM | POA: Insufficient documentation

## 2021-02-18 NOTE — Assessment & Plan Note (Signed)
takes Levothyroxine, TSH 0.71 09/10/20

## 2021-02-18 NOTE — Progress Notes (Signed)
Location:   [facility] Room Number: 993 Place of Service:  ALF (13) Provider: Lennie Odor Takeira Yanes NP  [name], MD  Patient Care Team: [name], MD as PCP - General (Family Medicine) [name] Natalina Wieting X, NP as Nurse Practitioner (Nurse Practitioner) [name], MD as Consulting Physician (Ophthalmology) [name], MD as Consulting Physician (Orthopedic Surgery) [name], [name], MD as Consulting Physician (Dermatology) [name], MD (Inactive) as Consulting Physician (Gastroenterology) [name], MD as Consulting Physician (General Surgery)  Extended Emergency Contact Information Primary Emergency Contact: Vicenta Dunning, [place] [facility] [employer] Phone: [phone] Relation: Daughter  Code Status: DNR Goals of care: Advanced Directive information Advanced Directives 02/15/2021  Does Patient Have a Medical Advance Directive? Yes  Type of Advance Directive -  Does patient want to make changes to medical advance directive? -  Copy of [place] in Chart? -  Pre-existing out of facility DNR order (yellow form or pink MOST form) -     Chief Complaint  Patient presents with   Acute Visit    Diarrhea    HPI:  Pt is a 88 y.o. female seen today for an acute visit for f/u ED eval 02/15/21 for fall, CT head negative for bleeding or acute intracranial process. The patient ambulates with walker.   Diarrhea, about a few days, denied nausea, vomiting, abd pain. She is afebrile.   Chronic pain, R+L hips, had prn Tramadol since 02/08/21, X-ray hip/pelvis negative for acute fxs, OA present R>L Hx of syncope,  aortic valve stenosis CKD Bun/creat 19/1.07 eGFR 50 02/15/21 Hyperlipidemia, LDL 110 11/22/20 Hypothyroidism, takes Levothyroxine, TSH 0.71 09/10/20 Dementia, mild Past Medical History:  Diagnosis Date   Cerebral atrophy ([place]) 01/21/2013   Hyperlipidemia    Hypothyroid    Osteoarthrosis,  unspecified whether generalized or localized, unspecified site    Osteoporosis, unspecified    Other and unspecified hyperlipidemia    Other generalized ischemic cerebrovascular disease 01/21/2013   small vessel disease   Syncope and collapse    Umbilical hernia without mention of obstruction or gangrene    Unspecified hearing loss    Unspecified hypothyroidism    Unspecified vitamin D deficiency    Ventral hernia, unspecified, without mention of obstruction or gangrene    Past Surgical History:  Procedure Laterality Date   CATARACT EXTRACTION W/ INTRAOCULAR LENS  IMPLANT, BILATERAL  2012   Dr. Bing Plume   HERNIA REPAIR  01/2008   laparoscopic Dr. [name]   INGUINAL HERNIA REPAIR N/A 05/10/2019   Procedure: LAPAROSCOPIC FEMORAL HERNIA REPAIR WITH MESH;  Surgeon: Ralene Ok, MD;  Location: [facility];  Service: General;  Laterality: N/A;   KNEE SURGERY  2008   Dr. Alyssa Grove  left   TOE AMPUTATION  1986   little toe right foot  Dr. Dema Severin; [place]    Allergies  Allergen Reactions   Mushroom Extract Complex Other (See Comments)    "Allergic," per paperwork from facility   Penicillins Diarrhea and Other (See Comments)    "Allergic," per paperwork from facility    Allergies as of 02/18/2021       Reactions   Mushroom Extract Complex Other (See Comments)   "Allergic," per paperwork from facility   Penicillins Diarrhea, Other (See Comments)   "Allergic," per paperwork from facility        Medication List        Accurate as  of February 18, 2021  2:27 PM. If you have any questions, ask your nurse or doctor.          acetaminophen 500 MG tablet Commonly known as: TYLENOL Take 2 tablets (1,000 mg total) by mouth every 6 (six) hours as needed.   aspirin 81 MG tablet Take 81 mg by mouth daily.   Calcium Carbonate-Vitamin D 600-200 MG-UNIT Tabs Take 1 tablet by mouth 2 (two) times daily.   Fish Oil 1200 MG Caps Take 1 capsule by mouth daily.   levothyroxine 75 MCG  tablet Commonly known as: SYNTHROID Take one tablet daily. What changed:  how much to take how to take this when to take this additional instructions   multivitamin with minerals Tabs tablet Take 1 tablet by mouth daily.        Review of Systems  Constitutional:  Positive for fatigue. Negative for appetite change and fever.  HENT:  Positive for hearing loss. Negative for congestion and voice change.   Respiratory:  Negative for cough and shortness of breath.   Cardiovascular:  Negative for leg swelling.  Gastrointestinal:  Positive for diarrhea. Negative for abdominal pain, nausea and vomiting.       Had a good BM today since surgery.   Genitourinary:  Positive for frequency. Negative for dysuria and urgency.  Musculoskeletal:  Positive for arthralgias and gait problem.       Right femur pain after fall, X-ray negative for fx, better.   Skin:  Negative for color change.  Neurological:  Negative for speech difficulty, weakness and light-headedness.       Memory lapses.   Psychiatric/Behavioral:  Negative for behavioral problems and sleep disturbance. The patient is not nervous/anxious.    Immunization History  Administered Date(s) Administered   Influenza Whole 10/28/2011, 11/10/2012, 10/29/2017   Influenza, High Dose Seasonal PF 11/05/2016, 11/11/2018, 11/12/2020   Influenza-Unspecified 11/14/2013, 10/26/2014, 11/08/2015, 11/09/2019   Moderna Sars-Covid-2 Vaccination 01/31/2019, 02/28/2019, 12/06/2019   Pneumococcal Conjugate-13 02/05/2017   Pneumococcal Polysaccharide-23 02/08/2007   Td 01/28/1991, 06/12/2017   Tdap 06/12/2017   Zoster Recombinat (Shingrix) 07/09/2017, 09/15/2017   Zoster, Live 12/27/2012   Pertinent  Health Maintenance Due  Topic Date Due   INFLUENZA VACCINE  Completed   DEXA SCAN  Completed   Fall Risk 08/04/2019 09/02/2019 04/20/2020 02/15/2021 02/16/2021  Falls in the past year? 1 1 0 - -  Was there an injury with Fall? 1 1 - - -  Fall Risk Category  Calculator 2 2 - - -  Fall Risk Category Moderate Moderate - - -  Patient Fall Risk Level - - - Low fall risk High fall risk   Functional Status Survey:    Vitals:   02/18/21 0954  BP: 120/68  Pulse: 90  Resp: 18  Temp: 97.6 F (36.4 C)  SpO2: 98%   There is no height or weight on file to calculate BMI. Physical Exam Constitutional:      Comments: Fatigue.   HENT:     Head: Normocephalic and atraumatic.     Mouth/Throat:     Mouth: Mucous membranes are moist.  Eyes:     Extraocular Movements: Extraocular movements intact.     Conjunctiva/sclera: Conjunctivae normal.     Pupils: Pupils are equal, round, and reactive to light.  Cardiovascular:     Rate and Rhythm: Normal rate and regular rhythm.     Heart sounds: Murmur heard.  Pulmonary:     Effort: Pulmonary effort is normal.  Breath sounds: Normal breath sounds. No rales.  Abdominal:     General: Bowel sounds are normal.     Palpations: Abdomen is soft.     Tenderness: There is no abdominal tenderness. There is no right CVA tenderness, left CVA tenderness, guarding or rebound.  Musculoskeletal:     Cervical back: Normal range of motion and neck supple.     Right lower leg: No edema.     Left lower leg: No edema.     Comments: Unsteady gait. C/o R+L hip pain when she try to stand up  Skin:    General: Skin is warm and dry.  Neurological:     General: No focal deficit present.     Mental Status: She is alert and oriented to person, place, and time. Mental status is at baseline.     Gait: Gait abnormal.  Psychiatric:        Mood and Affect: Mood normal.        Behavior: Behavior normal.        Thought Content: Thought content normal.    Labs reviewed: Recent Labs    09/10/20 1046 02/15/21 1308  NA 143 137  K 4.3 3.7  CL 108 102  CO2 27 24  GLUCOSE 91 122*  BUN 19 19  CREATININE 1.09* 1.07*  CALCIUM 9.1 9.1   Recent Labs    09/10/20 1046 02/15/21 1308  AST 14 74*  ALT 11 29  ALKPHOS  --  84   BILITOT 0.5 0.7  PROT 6.7 8.3*  ALBUMIN  --  3.9   Recent Labs    09/10/20 1046 02/15/21 1308  WBC 5.9 10.4  NEUTROABS 3,280  --   HGB 12.5 13.4  HCT 38.5 39.3  MCV 91.7 89.9  PLT 292 408*   Lab Results  Component Value Date   TSH 0.71 09/10/2020   Lab Results  Component Value Date   HGBA1C 5.9 (H) 01/31/2020   Lab Results  Component Value Date   CHOL 205 (H) 11/22/2020   HDL 65 11/22/2020   LDLCALC 110 (H) 11/22/2020   TRIG 180 (H) 11/22/2020   CHOLHDL 3.2 11/22/2020    Significant Diagnostic Results in last 30 days:  DG Shoulder Right  Result Date: 02/15/2021 CLINICAL DATA:  Trauma, fall, pain EXAM: RIGHT SHOULDER - 2+ VIEW COMPARISON:  None. FINDINGS: No recent fracture or dislocation is seen. Bony spurs seen in the right AC joint. IMPRESSION: No recent fracture or dislocation is seen in the right shoulder. Electronically Signed   By: Elmer Picker M.D.   On: 02/15/2021 13:52   CT Head Wo Contrast  Result Date: 02/15/2021 CLINICAL DATA:  Altered mental status, found down EXAM: CT HEAD WITHOUT CONTRAST TECHNIQUE: Contiguous axial images were obtained from the base of the skull through the vertex without intravenous contrast. RADIATION DOSE REDUCTION: This exam was performed according to the departmental dose-optimization program which includes automated exposure control, adjustment of the mA and/or kV according to patient size and/or use of iterative reconstruction technique. COMPARISON:  07/27/2019 FINDINGS: Brain: Mild atrophy. No evidence of acute infarction, hemorrhage, hydrocephalus, extra-axial collection or mass lesion/mass effect. Vascular: Atherosclerotic and physiologic intracranial calcifications. Skull: Normal. Negative for fracture or focal lesion. Sinuses/Orbits: No acute finding. Other: Bilateral TMJ DJD, right worse than left. Multiple dental restorations. IMPRESSION: Negative for bleed or other acute intracranial process. Electronically Signed   By: Lucrezia Europe M.D.   On: 02/15/2021 14:02   DG HIP UNILAT W OR  W/O PELVIS 2-3 VIEWS RIGHT  Result Date: 02/15/2021 CLINICAL DATA:  Trauma, fall, pain EXAM: DG HIP (WITH OR WITHOUT PELVIS) 2-3V RIGHT COMPARISON:  07/27/2019 FINDINGS: No recent fracture or dislocation is seen. Degenerative changes are noted in both hips, more severe on the right side. There is evidence of previous ventral hernia repair. IMPRESSION: No recent fracture or dislocation is seen. Degenerative changes are noted in both hips, more severe on the right side. Electronically Signed   By: Elmer Picker M.D.   On: 02/15/2021 13:53    Assessment/Plan: Abnormality of gait ED eval 02/15/21 for fall, CT head negative for bleeding or acute intracranial process. The patient ambulates with walker. Therapy to evl and tx.   Diarrhea about a few days, denied nausea, vomiting, abd pain. She is afebrile. Will avoid Tramadol, update CBC/diff, CMP/eGFR, C-diff toxin A/B x3, Amylase, Lipase.  Encourage oral fluid intake.   Osteoarthritis, multiple sites Chronic pain, R+L hips, had prn Tramadol since 02/08/21, X-ray hip/pelvis negative for acute fxs, OA present R>L. Prn Tylenol for now.   Dyslipidemia LDL 110 11/22/20, diet management.   Hypothyroidism  takes Levothyroxine, TSH 0.71 09/10/20  Mild dementia Will update MMSE    Family/ staff Communication: plan of care reviewed with the patient and charge nurse.   Labs/tests ordered:  C-diff toxin A/B x3, CBC/diff, CMP/eGFR, Amylase, Lipase  Time spend 40 minutes.

## 2021-02-18 NOTE — Assessment & Plan Note (Signed)
Will update MMSE

## 2021-02-18 NOTE — Assessment & Plan Note (Signed)
LDL 110 11/22/20, diet management.

## 2021-02-18 NOTE — Assessment & Plan Note (Signed)
ED eval 02/15/21 for fall, CT head negative for bleeding or acute intracranial process. The patient ambulates with walker. Therapy to evl and tx.

## 2021-02-18 NOTE — Assessment & Plan Note (Signed)
about a few days, denied nausea, vomiting, abd pain. She is afebrile. Will avoid Tramadol, update CBC/diff, CMP/eGFR, C-diff toxin A/B x3, Amylase, Lipase.  Encourage oral fluid intake.

## 2021-02-18 NOTE — Assessment & Plan Note (Signed)
Chronic pain, R+L hips, had prn Tramadol since 02/08/21, X-ray hip/pelvis negative for acute fxs, OA present R>L. Prn Tylenol for now.

## 2021-02-19 DIAGNOSIS — M6281 Muscle weakness (generalized): Secondary | ICD-10-CM | POA: Diagnosis not present

## 2021-02-19 DIAGNOSIS — R197 Diarrhea, unspecified: Secondary | ICD-10-CM | POA: Diagnosis not present

## 2021-02-19 LAB — BASIC METABOLIC PANEL
BUN: 24 — AB (ref 4–21)
CO2: 32 — AB (ref 13–22)
Chloride: 105 (ref 99–108)
Creatinine: 1 (ref 0.5–1.1)
Glucose: 95
Potassium: 4.1 (ref 3.4–5.3)
Sodium: 140 (ref 137–147)

## 2021-02-19 LAB — HEPATIC FUNCTION PANEL
ALT: 22 (ref 7–35)
AST: 20 (ref 13–35)
Alkaline Phosphatase: 66 (ref 25–125)
Bilirubin, Total: 0.3

## 2021-02-19 LAB — COMPREHENSIVE METABOLIC PANEL
Albumin: 3.2 — AB (ref 3.5–5.0)
Calcium: 8.7 (ref 8.7–10.7)
Globulin: 2.6

## 2021-02-19 LAB — CBC AND DIFFERENTIAL
HCT: 33 — AB (ref 36–46)
Hemoglobin: 10.9 — AB (ref 12.0–16.0)
Neutrophils Absolute: 5464
Platelets: 387 (ref 150–399)
WBC: 7.6

## 2021-02-19 LAB — CBC: RBC: 3.65 — AB (ref 3.87–5.11)

## 2021-02-20 ENCOUNTER — Non-Acute Institutional Stay: Payer: PPO | Admitting: Orthopedic Surgery

## 2021-02-20 ENCOUNTER — Encounter: Payer: Self-pay | Admitting: Orthopedic Surgery

## 2021-02-20 DIAGNOSIS — M1611 Unilateral primary osteoarthritis, right hip: Secondary | ICD-10-CM | POA: Diagnosis not present

## 2021-02-20 DIAGNOSIS — R296 Repeated falls: Secondary | ICD-10-CM

## 2021-02-20 DIAGNOSIS — M79604 Pain in right leg: Secondary | ICD-10-CM | POA: Diagnosis not present

## 2021-02-20 DIAGNOSIS — M79651 Pain in right thigh: Secondary | ICD-10-CM | POA: Diagnosis not present

## 2021-02-20 DIAGNOSIS — M546 Pain in thoracic spine: Secondary | ICD-10-CM | POA: Diagnosis not present

## 2021-02-20 DIAGNOSIS — M545 Low back pain, unspecified: Secondary | ICD-10-CM

## 2021-02-20 DIAGNOSIS — M79661 Pain in right lower leg: Secondary | ICD-10-CM | POA: Diagnosis not present

## 2021-02-20 DIAGNOSIS — M542 Cervicalgia: Secondary | ICD-10-CM | POA: Diagnosis not present

## 2021-02-20 NOTE — Progress Notes (Signed)
Location:  [facility] Room Number: [place] of Service:  ALF [phone]) Provider:  Windell Moulding, AGNP-C  [name], MD  Patient Care Team: [name], MD as PCP - General (Family Medicine) [name] [name], [name], NP as Nurse Practitioner (Nurse Practitioner) [name], MD as Consulting Physician (Ophthalmology) [name], MD as Consulting Physician (Orthopedic Surgery) [name], Faron Whitelock, MD as Consulting Physician (Dermatology) [name], MD (Inactive) as Consulting Physician (Gastroenterology) [name], MD as Consulting Physician (General Surgery)  Extended Emergency Contact Information Primary Emergency Contact: Vicenta Dunning, [place] of [employer] Phone: [phone] Relation: Daughter  Code Status:  Full code Goals of care: Advanced Directive information Advanced Directives 02/15/2021  Does Patient Have a Medical Advance Directive? Yes  Type of Advance Directive -  Does patient want to make changes to medical advance directive? -  Copy of [place] in Chart? -  Pre-existing out of facility DNR order (yellow form or pink MOST form) -     Chief Complaint  Patient presents with   Acute Visit    Fall/ back pain    HPI:  Pt is a 88 y.o. female seen today for acute visit due to fall.   Nursing reports she fell this afternoon. No apparent injury. Vitals stable. She is a poor historian and cannot provide details of fall. Reports mild pain to lower back and right leg/hip. She is able to bear weight on both legs. When I press the side of her right thigh she yells. She was on tramadol for pain, but recently ran out. 01/20-01/21 she presented to the ED after falling at home. She had a small contusion to scalp. Xray right shoulder unremarkable. Xray right hip unremarkable, degenerative changes seen in both hips R>L. CT head unremarkable. 01/04 she was seen by Dr. Sabra Heck for  acute left sided back pain, xray ordered but not completed. Denies numbness/tingling down legs, denies loss of bowel/bladder function.    Past Medical History:  Diagnosis Date   Cerebral atrophy ([place]) 01/21/2013   Hyperlipidemia    Hypothyroid    Osteoarthrosis, unspecified whether generalized or localized, unspecified site    Osteoporosis, unspecified    Other and unspecified hyperlipidemia    Other generalized ischemic cerebrovascular disease 01/21/2013   small vessel disease   Syncope and collapse    Umbilical hernia without mention of obstruction or gangrene    Unspecified hearing loss    Unspecified hypothyroidism    Unspecified vitamin D deficiency    Ventral hernia, unspecified, without mention of obstruction or gangrene    Past Surgical History:  Procedure Laterality Date   CATARACT EXTRACTION W/ INTRAOCULAR LENS  IMPLANT, BILATERAL  2012   Dr. Bing Plume   HERNIA REPAIR  01/2008   laparoscopic Dr. [name]   INGUINAL HERNIA REPAIR N/A 05/10/2019   Procedure: LAPAROSCOPIC FEMORAL HERNIA REPAIR WITH MESH;  Surgeon: Ralene Ok, MD;  Location: [facility];  Service: General;  Laterality: N/A;   KNEE SURGERY  2008   Dr. Alyssa Grove  left   TOE AMPUTATION  1986   little toe right foot  Dr. Dema Severin; [place]    Allergies  Allergen Reactions   Mushroom Extract Complex Other (See Comments)    "Allergic," per paperwork from facility   Penicillins Diarrhea and Other (See Comments)    "Allergic," per paperwork from facility    Outpatient Encounter Medications as  of 02/20/2021  Medication Sig   acetaminophen (TYLENOL) 500 MG tablet Take 2 tablets (1,000 mg total) by mouth every 6 (six) hours as needed.   aspirin 81 MG tablet Take 81 mg by mouth daily.   Calcium Carbonate-Vitamin D 600-200 MG-UNIT TABS Take 1 tablet by mouth 2 (two) times daily.   levothyroxine (SYNTHROID) 75 MCG tablet Take one tablet daily. (Patient taking differently: Take 75 mcg by mouth daily before breakfast.)    Multiple Vitamin (MULTIVITAMIN WITH MINERALS) TABS Take 1 tablet by mouth daily.   Omega-3 Fatty Acids (FISH OIL) 1200 MG CAPS Take 1 capsule by mouth daily.   No facility-administered encounter medications on file as of 02/20/2021.    Review of Systems  Unable to perform ROS: Dementia   Immunization History  Administered Date(s) Administered   Influenza Whole 10/28/2011, 11/10/2012, 10/29/2017   Influenza, High Dose Seasonal PF 11/05/2016, 11/11/2018, 11/12/2020   Influenza-Unspecified 11/14/2013, 10/26/2014, 11/08/2015, 11/09/2019   Moderna Sars-Covid-2 Vaccination 01/31/2019, 02/28/2019, 12/06/2019   Pneumococcal Conjugate-13 02/05/2017   Pneumococcal Polysaccharide-23 02/08/2007   Td 01/28/1991, 06/12/2017   Tdap 06/12/2017   Zoster Recombinat (Shingrix) 07/09/2017, 09/15/2017   Zoster, Live 12/27/2012   Pertinent  Health Maintenance Due  Topic Date Due   INFLUENZA VACCINE  Completed   DEXA SCAN  Completed   Fall Risk 08/04/2019 09/02/2019 04/20/2020 02/15/2021 02/16/2021  Falls in the past year? 1 1 0 - -  Was there an injury with Fall? 1 1 - - -  Fall Risk Category Calculator 2 2 - - -  Fall Risk Category Moderate Moderate - - -  Patient Fall Risk Level - - - Low fall risk High fall risk   Functional Status Survey:    Vitals:   02/20/21 2034  BP: 118/70  Pulse: 78  Resp: 20  Temp: 97.7 F (36.5 C)  SpO2: 97%  Weight: 135 lb (61.2 kg)   Body mass index is 25.51 kg/m. Physical Exam Vitals reviewed.  Constitutional:      General: She is not in acute distress. HENT:     Head: Normocephalic.  Eyes:     General:        Right eye: No discharge.        Left eye: No discharge.  Cardiovascular:     Rate and Rhythm: Normal rate and regular rhythm.     Pulses: Normal pulses.     Heart sounds: Murmur heard.  Pulmonary:     Effort: Pulmonary effort is normal. No respiratory distress.     Breath sounds: Normal breath sounds. No wheezing.  Abdominal:     General:  Bowel sounds are normal. There is no distension.     Palpations: Abdomen is soft.     Tenderness: There is no abdominal tenderness.  Musculoskeletal:        General: Tenderness present.     Cervical back: Normal and neck supple.     Thoracic back: Normal.     Lumbar back: No swelling or tenderness. Normal range of motion.     Right hip: Tenderness present. No deformity or crepitus. Normal range of motion. Normal strength.     Right lower leg: No edema.     Left lower leg: No edema.     Comments: No internal/external rotation, yells out loud with palpation over lateral side of femur, no apparent deformity  Skin:    General: Skin is warm and dry.     Capillary Refill: Capillary refill takes less than 2  seconds.  Neurological:     General: No focal deficit present.     Mental Status: She is alert. Mental status is at baseline.  Psychiatric:        Mood and Affect: Mood normal.        Behavior: Behavior normal.    Labs reviewed: Recent Labs    09/10/20 1046 02/15/21 1308  NA 143 137  K 4.3 3.7  CL 108 102  CO2 27 24  GLUCOSE 91 122*  BUN 19 19  CREATININE 1.09* 1.07*  CALCIUM 9.1 9.1   Recent Labs    09/10/20 1046 02/15/21 1308  AST 14 74*  ALT 11 29  ALKPHOS  --  84  BILITOT 0.5 0.7  PROT 6.7 8.3*  ALBUMIN  --  3.9   Recent Labs    09/10/20 1046 02/15/21 1308  WBC 5.9 10.4  NEUTROABS 3,280  --   HGB 12.5 13.4  HCT 38.5 39.3  MCV 91.7 89.9  PLT 292 408*   Lab Results  Component Value Date   TSH 0.71 09/10/2020   Lab Results  Component Value Date   HGBA1C 5.9 (H) 01/31/2020   Lab Results  Component Value Date   CHOL 205 (H) 11/22/2020   HDL 65 11/22/2020   LDLCALC 110 (H) 11/22/2020   TRIG 180 (H) 11/22/2020   CHOLHDL 3.2 11/22/2020    Significant Diagnostic Results in last 30 days:  DG Shoulder Right  Result Date: 02/15/2021 CLINICAL DATA:  Trauma, fall, pain EXAM: RIGHT SHOULDER - 2+ VIEW COMPARISON:  None. FINDINGS: No recent fracture or  dislocation is seen. Bony spurs seen in the right AC joint. IMPRESSION: No recent fracture or dislocation is seen in the right shoulder. Electronically Signed   By: Elmer Picker M.D.   On: 02/15/2021 13:52   CT Head Wo Contrast  Result Date: 02/15/2021 CLINICAL DATA:  Altered mental status, found down EXAM: CT HEAD WITHOUT CONTRAST TECHNIQUE: Contiguous axial images were obtained from the base of the skull through the vertex without intravenous contrast. RADIATION DOSE REDUCTION: This exam was performed according to the departmental dose-optimization program which includes automated exposure control, adjustment of the mA and/or kV according to patient size and/or use of iterative reconstruction technique. COMPARISON:  07/27/2019 FINDINGS: Brain: Mild atrophy. No evidence of acute infarction, hemorrhage, hydrocephalus, extra-axial collection or mass lesion/mass effect. Vascular: Atherosclerotic and physiologic intracranial calcifications. Skull: Normal. Negative for fracture or focal lesion. Sinuses/Orbits: No acute finding. Other: Bilateral TMJ DJD, right worse than left. Multiple dental restorations. IMPRESSION: Negative for bleed or other acute intracranial process. Electronically Signed   By: Lucrezia Europe M.D.   On: 02/15/2021 14:02   DG HIP UNILAT W OR W/O PELVIS 2-3 VIEWS RIGHT  Result Date: 02/15/2021 CLINICAL DATA:  Trauma, fall, pain EXAM: DG HIP (WITH OR WITHOUT PELVIS) 2-3V RIGHT COMPARISON:  07/27/2019 FINDINGS: No recent fracture or dislocation is seen. Degenerative changes are noted in both hips, more severe on the right side. There is evidence of previous ventral hernia repair. IMPRESSION: No recent fracture or dislocation is seen. Degenerative changes are noted in both hips, more severe on the right side. Electronically Signed   By: Elmer Picker M.D.   On: 02/15/2021 13:53    Assessment/Plan 1. Recurrent falls - poor historian due to dementia - second episode in one week -  cont using walker - PT ordered  2. Acute midline low back pain without sciatica - exam unremarkable - xray to spine -  xray to right leg - start tramadol 50 mg po bid prn x 5 days  3. Right leg pain - increased pain over lateral aspect of femur/hip - see above    Family/ staff Communication: plan discussed with patient and nurse  Labs/tests ordered:  xray spine/ right leg

## 2021-02-21 DIAGNOSIS — Z9181 History of falling: Secondary | ICD-10-CM | POA: Diagnosis not present

## 2021-02-21 DIAGNOSIS — R2689 Other abnormalities of gait and mobility: Secondary | ICD-10-CM | POA: Diagnosis not present

## 2021-02-21 DIAGNOSIS — R197 Diarrhea, unspecified: Secondary | ICD-10-CM | POA: Diagnosis not present

## 2021-02-21 DIAGNOSIS — R41841 Cognitive communication deficit: Secondary | ICD-10-CM | POA: Diagnosis not present

## 2021-02-21 DIAGNOSIS — M6281 Muscle weakness (generalized): Secondary | ICD-10-CM | POA: Diagnosis not present

## 2021-02-21 DIAGNOSIS — M5459 Other low back pain: Secondary | ICD-10-CM | POA: Diagnosis not present

## 2021-02-21 MED ORDER — TRAMADOL HCL 50 MG PO TABS: 50.0000 mg | ORAL_TABLET | Freq: Two times a day (BID) | ORAL | 0 refills | Status: AC | PRN

## 2021-02-27 DIAGNOSIS — M25561 Pain in right knee: Secondary | ICD-10-CM | POA: Diagnosis not present

## 2021-02-27 DIAGNOSIS — M6281 Muscle weakness (generalized): Secondary | ICD-10-CM | POA: Diagnosis not present

## 2021-02-27 DIAGNOSIS — M25562 Pain in left knee: Secondary | ICD-10-CM | POA: Diagnosis not present

## 2021-02-27 DIAGNOSIS — Z9181 History of falling: Secondary | ICD-10-CM | POA: Diagnosis not present

## 2021-02-27 DIAGNOSIS — R41841 Cognitive communication deficit: Secondary | ICD-10-CM | POA: Diagnosis not present

## 2021-03-04 ENCOUNTER — Encounter: Payer: Self-pay | Admitting: Nurse Practitioner

## 2021-03-04 ENCOUNTER — Non-Acute Institutional Stay: Payer: PPO | Admitting: Nurse Practitioner

## 2021-03-04 DIAGNOSIS — K625 Hemorrhage of anus and rectum: Secondary | ICD-10-CM | POA: Diagnosis not present

## 2021-03-04 DIAGNOSIS — D638 Anemia in other chronic diseases classified elsewhere: Secondary | ICD-10-CM

## 2021-03-04 DIAGNOSIS — R634 Abnormal weight loss: Secondary | ICD-10-CM

## 2021-03-04 DIAGNOSIS — N1831 Chronic kidney disease, stage 3a: Secondary | ICD-10-CM

## 2021-03-04 DIAGNOSIS — E538 Deficiency of other specified B group vitamins: Secondary | ICD-10-CM

## 2021-03-04 NOTE — Assessment & Plan Note (Signed)
Hx of syncope.

## 2021-03-04 NOTE — Assessment & Plan Note (Signed)
,   LDL 110 11/22/20

## 2021-03-04 NOTE — Assessment & Plan Note (Addendum)
Bun/creat 19/1.07 eGFR 50 02/15/21 03/05/21 Bun/creat 19/1.13 03/06/21

## 2021-03-04 NOTE — Assessment & Plan Note (Signed)
R+L hips, 02/20/21 X-ray C-spine, no acute abnormality. X-ray R femur no acute abnormality. X-ray R hip OA. X-ray T spine no acute abnormality. X-ray L spine: degenerative changes. Tylenol for pain.

## 2021-03-04 NOTE — Assessment & Plan Note (Signed)
takes Levothyroxine, TSH 0.71 09/10/20

## 2021-03-04 NOTE — Assessment & Plan Note (Signed)
reported having blood in toilet after BM for 2 days, stated her rectal area felt irritated, hard stools felt upon digital anal rectal exam, no hemorrhoids noted.  Apply 2% Hydrocortisone cream to anal rectal area daily, adding Senokot S II qhs, Bisacodyl suppository x1, update CBC/diff, CMP in am

## 2021-03-04 NOTE — Progress Notes (Addendum)
Location:  [place] Room Number: [place] of Service:  ALF [phone]) Provider:  Lasya Vetter Otho Darner, NP  Patient Care Team: [name], MD as PCP - General (Family Medicine) [name] Deandria Klute X, NP as Nurse Practitioner (Nurse Practitioner) [name], MD as Consulting Physician (Ophthalmology) [name], MD as Consulting Physician (Orthopedic Surgery) [name], [name], MD as Consulting Physician (Dermatology) [name], MD (Inactive) as Consulting Physician (Gastroenterology) [name], MD as Consulting Physician (General Surgery)  Extended Emergency Contact Information Primary Emergency Contact: Vicenta Dunning, [place] of [employer] Phone: [phone] Relation: Daughter  Code Status:  Full code Goals of care: Advanced Directive information Advanced Directives 03/04/2021  Does Patient Have a Medical Advance Directive? Yes  Type of Paramedic of [place];Living will  Does patient want to make changes to medical advance directive? No - Patient declined  Copy of [place] in Chart? Yes - validated most recent copy scanned in chart (See row information)  Pre-existing out of facility DNR order (yellow form or pink MOST form) Pink MOST form placed in chart (order not valid for inpatient use)     Chief Complaint  Patient presents with   Acute Visit    Acute visit for rectal bleed.    HPI:  Pt is a 88 y.o. female seen today for an acute visit for reported having blood in toilet after BM for 2 days, stated her rectal area felt irritated, hard stools felt upon digital anal rectal exam, no hemorrhoids noted.      Chronic pain, R+L hips, 02/20/21 X-ray C-spine, no acute abnormality. X-ray R femur no acute abnormality. X-ray R hip OA. X-ray T spine no acute abnormality. X-ray L spine: degenerative changes.  Hx of syncope,  aortic valve stenosis CKD Bun/creat 19/1.07  eGFR 50 02/15/21 Hyperlipidemia, LDL 110 11/22/20 Hypothyroidism, takes Levothyroxine, TSH 0.71 09/10/20 Dementia, mild  Anemia, Hgb 10.9 02/19/21, does takes ASA 45m qd.  Past Medical History:  Diagnosis Date   Cerebral atrophy ([place] 01/21/2013   Hyperlipidemia    Hypothyroid    Osteoarthrosis, unspecified whether generalized or localized, unspecified site    Osteoporosis, unspecified    Other and unspecified hyperlipidemia    Other generalized ischemic cerebrovascular disease 01/21/2013   small vessel disease   Syncope and collapse    Umbilical hernia without mention of obstruction or gangrene    Unspecified hearing loss    Unspecified hypothyroidism    Unspecified vitamin D deficiency    Ventral hernia, unspecified, without mention of obstruction or gangrene    Past Surgical History:  Procedure Laterality Date   CATARACT EXTRACTION W/ INTRAOCULAR LENS  IMPLANT, BILATERAL  2012   Dr. DBing Plume  HERNIA REPAIR  01/2008   laparoscopic Dr. DAlphonsa Overall  INGUINAL HERNIA REPAIR N/A 05/10/2019   Procedure: LAPAROSCOPIC FEMORAL HERNIA REPAIR WITH MESH;  Surgeon: RRalene Ok MD;  Location: [facility]  Service: General;  Laterality: N/A;   KNEE SURGERY  2008   Dr. DAlyssa Grove left   TOE AMPUTATION  1986   little toe right foot  Dr. WDema Severin [place]    Allergies  Allergen Reactions   Dust Mite Extract    Mushroom Extract Complex Other (See Comments)    "Allergic," per paperwork from facility   Penicillins Diarrhea and Other (See Comments)    "Allergic," per paperwork from facility  Outpatient Encounter Medications as of 03/04/2021  Medication Sig   acetaminophen (TYLENOL) 500 MG tablet Take 2 tablets (1,000 mg total) by mouth every 6 (six) hours as needed.   aspirin 81 MG tablet Take 81 mg by mouth daily.   Calcium Carbonate-Vitamin D 600-200 MG-UNIT TABS Take 1 tablet by mouth 2 (two) times daily.   HYDROCORTISONE, TOPICAL, 2 % LOTN Apply topically daily.   levothyroxine  (SYNTHROID) 75 MCG tablet Take one tablet daily.   Multiple Vitamin (MULTIVITAMIN WITH MINERALS) TABS Take 1 tablet by mouth daily.   Omega-3 Fatty Acids (FISH OIL) 1200 MG CAPS Take 1 capsule by mouth daily.   sennosides-docusate sodium (SENOKOT-S) 8.6-50 MG tablet Take 2 tablets by mouth daily.   No facility-administered encounter medications on file as of 03/04/2021.    Review of Systems  Constitutional:  Positive for unexpected weight change. Negative for appetite change, fatigue and fever.       ? Lost #8Ibs in 2 weeks.   HENT:  Positive for hearing loss. Negative for congestion and voice change.   Respiratory:  Negative for cough.   Cardiovascular:  Negative for leg swelling.  Gastrointestinal:  Positive for constipation. Negative for abdominal pain, nausea and vomiting.       Reported right red blood in toilet after BM, c/o irritation when passing BM  Genitourinary:  Positive for frequency. Negative for dysuria and urgency.  Musculoskeletal:  Positive for arthralgias and gait problem.       Right femur pain after fall, X-ray negative for fx, better.   Skin:  Negative for color change.  Neurological:  Negative for speech difficulty, weakness and light-headedness.       Memory lapses.   Psychiatric/Behavioral:  Negative for behavioral problems and sleep disturbance. The patient is not nervous/anxious.    Immunization History  Administered Date(s) Administered   Influenza Whole 10/28/2011, 11/10/2012, 10/29/2017   Influenza, High Dose Seasonal PF 11/05/2016, 11/11/2018, 11/12/2020   Influenza-Unspecified 11/14/2013, 10/26/2014, 11/08/2015, 11/09/2019   Moderna Sars-Covid-2 Vaccination 01/31/2019, 02/28/2019, 12/06/2019   Pneumococcal Conjugate-13 02/05/2017   Pneumococcal Polysaccharide-23 02/08/2007   Td 01/28/1991, 06/12/2017   Tdap 06/12/2017   Zoster Recombinat (Shingrix) 07/09/2017, 09/15/2017   Zoster, Live 12/27/2012   Pertinent  Health Maintenance Due  Topic Date Due    INFLUENZA VACCINE  Completed   DEXA SCAN  Completed   Fall Risk 08/04/2019 09/02/2019 04/20/2020 02/15/2021 02/16/2021  Falls in the past year? 1 1 0 - -  Was there an injury with Fall? 1 1 - - -  Fall Risk Category Calculator 2 2 - - -  Fall Risk Category Moderate Moderate - - -  Patient Fall Risk Level - - - Low fall risk High fall risk   Functional Status Survey:    Vitals:   03/04/21 1057  BP: 126/70  Pulse: 76  Resp: 20  Temp: 98.2 F (36.8 C)  SpO2: 96%  Weight: 127 lb 9.6 oz (57.9 kg)  Height: $Remove'4\' 10"'yxpmJfW$  (1.473 m)   Body mass index is 26.67 kg/m. Physical Exam Constitutional:      Comments: Fatigue.   HENT:     Head: Normocephalic and atraumatic.     Mouth/Throat:     Mouth: Mucous membranes are moist.  Eyes:     Extraocular Movements: Extraocular movements intact.     Conjunctiva/sclera: Conjunctivae normal.     Pupils: Pupils are equal, round, and reactive to light.  Cardiovascular:     Rate and Rhythm: Normal rate and  regular rhythm.     Heart sounds: Murmur heard.  Pulmonary:     Effort: Pulmonary effort is normal.     Breath sounds: Normal breath sounds. No rales.  Abdominal:     General: Bowel sounds are normal.     Palpations: Abdomen is soft.     Tenderness: There is no abdominal tenderness. There is no right CVA tenderness, left CVA tenderness, guarding or rebound.  Genitourinary:    Rectum: Normal.     Comments: Bright blood in anal rectal area, no hemorrhoids, hard stools felt on my digital rectal exam.  Musculoskeletal:     Cervical back: Normal range of motion and neck supple.     Right lower leg: No edema.     Left lower leg: No edema.     Comments: Unsteady gait. C/o R+L hip pain when she try to stand up  Skin:    General: Skin is warm and dry.  Neurological:     General: No focal deficit present.     Mental Status: She is alert and oriented to person, place, and time. Mental status is at baseline.     Gait: Gait abnormal.  Psychiatric:         Mood and Affect: Mood normal.        Behavior: Behavior normal.        Thought Content: Thought content normal.    Labs reviewed: Recent Labs    09/10/20 1046 02/15/21 1308 02/19/21 0000  NA 143 137 140  K 4.3 3.7 4.1  CL 108 102 105  CO2 27 24 32*  GLUCOSE 91 122*  --   BUN 19 19 24*  CREATININE 1.09* 1.07* 1.0  CALCIUM 9.1 9.1 8.7   Recent Labs    09/10/20 1046 02/15/21 1308 02/19/21 0000  AST 14 74* 20  ALT _0 ALKPHOS  --  84 66  BILITOT 0.5 0.7  --   PROT 6.7 8.3*  --   ALBUMIN  --  3.9 3.2*   Recent Labs    09/10/20 1046 02/15/21 1308 02/19/21 0000  WBC 5.9 10.4 7.6  NEUTROABS 3,280  --  5,464.00  HGB 12.5 13.4 10.9*  HCT 38.5 39.3 33*  MCV 91.7 89.9  --   PLT 292 408* 387   Lab Results  Component Value Date   TSH 0.71 09/10/2020   Lab Results  Component Value Date   HGBA1C 5.9 (H) 01/31/2020   Lab Results  Component Value Date   CHOL 205 (H) 11/22/2020   HDL 65 11/22/2020   LDLCALC 110 (H) 11/22/2020   TRIG 180 (H) 11/22/2020   CHOLHDL 3.2 11/22/2020    Significant Diagnostic Results in last 30 days:  DG Shoulder Right  Result Date: 02/15/2021 CLINICAL DATA:  Trauma, fall, pain EXAM: RIGHT SHOULDER - 2+ VIEW COMPARISON:  None. FINDINGS: No recent fracture or dislocation is seen. Bony spurs seen in the right AC joint. IMPRESSION: No recent fracture or dislocation is seen in the right shoulder. Electronically Signed   By: Elmer Picker M.D.   On: 02/15/2021 13:52   CT Head Wo Contrast  Result Date: 02/15/2021 CLINICAL DATA:  Altered mental status, found down EXAM: CT HEAD WITHOUT CONTRAST TECHNIQUE: Contiguous axial images were obtained from the base of the skull through the vertex without intravenous contrast. RADIATION DOSE REDUCTION: This exam was performed according to the departmental dose-optimization program which includes automated exposure control, adjustment of the mA and/or kV according to patient  size and/or use of  iterative reconstruction technique. COMPARISON:  07/27/2019 FINDINGS: Brain: Mild atrophy. No evidence of acute infarction, hemorrhage, hydrocephalus, extra-axial collection or mass lesion/mass effect. Vascular: Atherosclerotic and physiologic intracranial calcifications. Skull: Normal. Negative for fracture or focal lesion. Sinuses/Orbits: No acute finding. Other: Bilateral TMJ DJD, right worse than left. Multiple dental restorations. IMPRESSION: Negative for bleed or other acute intracranial process. Electronically Signed   By: Lucrezia Europe M.D.   On: 02/15/2021 14:02   DG HIP UNILAT W OR W/O PELVIS 2-3 VIEWS RIGHT  Result Date: 02/15/2021 CLINICAL DATA:  Trauma, fall, pain EXAM: DG HIP (WITH OR WITHOUT PELVIS) 2-3V RIGHT COMPARISON:  07/27/2019 FINDINGS: No recent fracture or dislocation is seen. Degenerative changes are noted in both hips, more severe on the right side. There is evidence of previous ventral hernia repair. IMPRESSION: No recent fracture or dislocation is seen. Degenerative changes are noted in both hips, more severe on the right side. Electronically Signed   By: Elmer Picker M.D.   On: 02/15/2021 13:53    Assessment/Plan Bright red rectal bleeding reported having blood in toilet after BM for 2 days, stated her rectal area felt irritated, hard stools felt upon digital anal rectal exam, no hemorrhoids noted.  Apply 2% Hydrocortisone cream to anal rectal area daily, adding Senokot S II qhs, Bisacodyl suppository x1, update CBC/diff, CMP in am   Osteoarthritis, multiple sites R+L hips, 02/20/21 X-ray C-spine, no acute abnormality. X-ray R femur no acute abnormality. X-ray R hip OA. X-ray T spine no acute abnormality. X-ray L spine: degenerative changes. Tylenol for pain.   Hypothyroidism takes Levothyroxine, TSH 0.71 09/10/20  Mixed hyperlipidemia , LDL 110 11/22/20  Stage 3a chronic kidney disease ([place]) Bun/creat 19/1.07 eGFR 50 02/15/21 03/05/21 Bun/creat 19/1.13  03/06/21  Aortic valve stenosis Hx of syncope.   B12 deficiency Hgb 10.9 02/19/21, does takes ASA 66m qd, not taking Vit B12 presently. Repeat CBC  Weight loss #8Ibs in 2 weeks? F/u dietary recommendation. TSH 0.71 09/10/20  Anemia, chronic disease 03/05/21 wbc 5.0, Hgb 10.8, plt 409, neutrophils 60.1     Family/ staff Communication: plan of care reviewed with the patient and charge nurse.   Labs/tests ordered: CBC, CMP  Time spend 40 minutes.

## 2021-03-05 ENCOUNTER — Encounter: Payer: Self-pay | Admitting: Nurse Practitioner

## 2021-03-05 DIAGNOSIS — N189 Chronic kidney disease, unspecified: Secondary | ICD-10-CM | POA: Diagnosis not present

## 2021-03-05 DIAGNOSIS — K625 Hemorrhage of anus and rectum: Secondary | ICD-10-CM | POA: Diagnosis not present

## 2021-03-05 LAB — COMPREHENSIVE METABOLIC PANEL
Albumin: 3.3 — AB (ref 3.5–5.0)
Calcium: 8.9 (ref 8.7–10.7)
Globulin: 2.6

## 2021-03-05 LAB — BASIC METABOLIC PANEL
BUN: 19 (ref 4–21)
CO2: 26 — AB (ref 13–22)
Chloride: 106 (ref 99–108)
Creatinine: 1.1 (ref 0.5–1.1)
Glucose: 93
Potassium: 4.4 mEq/L (ref 3.5–5.1)
Sodium: 140 (ref 137–147)

## 2021-03-05 LAB — CBC AND DIFFERENTIAL
HCT: 32 — AB (ref 36–46)
Hemoglobin: 10.8 — AB (ref 12.0–16.0)
Neutrophils Absolute: 3005
Platelets: 409 10*3/uL — AB (ref 150–400)
WBC: 5

## 2021-03-05 LAB — CBC: RBC: 3.58 — AB (ref 3.87–5.11)

## 2021-03-05 LAB — HEPATIC FUNCTION PANEL
ALT: 9 U/L (ref 7–35)
AST: 13 (ref 13–35)
Alkaline Phosphatase: 76 (ref 25–125)
Bilirubin, Total: 0.3

## 2021-03-05 NOTE — Assessment & Plan Note (Signed)
#  8Ibs in 2 weeks? F/u dietary recommendation. TSH 0.71 09/10/20

## 2021-03-05 NOTE — Assessment & Plan Note (Signed)
Hgb 10.9 02/19/21, does takes ASA 81mg  qd, not taking Vit B12 presently. Repeat CBC

## 2021-03-07 DIAGNOSIS — D638 Anemia in other chronic diseases classified elsewhere: Secondary | ICD-10-CM | POA: Insufficient documentation

## 2021-03-07 NOTE — Assessment & Plan Note (Signed)
03/05/21 wbc 5.0, Hgb 10.8, plt 409, neutrophils 60.1

## 2021-03-27 ENCOUNTER — Encounter (HOSPITAL_COMMUNITY): Payer: Self-pay | Admitting: Cardiology

## 2021-03-27 ENCOUNTER — Encounter: Payer: PPO | Admitting: Family Medicine

## 2021-03-27 ENCOUNTER — Encounter: Payer: Self-pay | Admitting: Cardiology

## 2021-03-27 ENCOUNTER — Other Ambulatory Visit: Payer: Self-pay

## 2021-03-27 ENCOUNTER — Ambulatory Visit (HOSPITAL_COMMUNITY): Payer: PPO | Attending: Cardiology

## 2021-03-27 DIAGNOSIS — Z9181 History of falling: Secondary | ICD-10-CM | POA: Diagnosis not present

## 2021-03-27 DIAGNOSIS — M6281 Muscle weakness (generalized): Secondary | ICD-10-CM | POA: Diagnosis not present

## 2021-03-27 DIAGNOSIS — M25562 Pain in left knee: Secondary | ICD-10-CM | POA: Diagnosis not present

## 2021-03-27 DIAGNOSIS — M25561 Pain in right knee: Secondary | ICD-10-CM | POA: Diagnosis not present

## 2021-03-27 DIAGNOSIS — R41841 Cognitive communication deficit: Secondary | ICD-10-CM | POA: Diagnosis not present

## 2021-03-27 NOTE — Progress Notes (Unsigned)
Patient ID: Krista Bowman, female   DOB: 1931-12-08, 88 y.o.   MRN: [id number] ? ?Verified appointment "no show" status with Molly at 9:44am.  ?

## 2021-04-02 ENCOUNTER — Encounter: Payer: Self-pay | Admitting: Internal Medicine

## 2021-04-02 ENCOUNTER — Non-Acute Institutional Stay: Payer: PPO | Admitting: Internal Medicine

## 2021-04-02 DIAGNOSIS — G8929 Other chronic pain: Secondary | ICD-10-CM | POA: Diagnosis not present

## 2021-04-02 DIAGNOSIS — I350 Nonrheumatic aortic (valve) stenosis: Secondary | ICD-10-CM

## 2021-04-02 DIAGNOSIS — E039 Hypothyroidism, unspecified: Secondary | ICD-10-CM | POA: Diagnosis not present

## 2021-04-02 DIAGNOSIS — R4189 Other symptoms and signs involving cognitive functions and awareness: Secondary | ICD-10-CM

## 2021-04-02 DIAGNOSIS — M25561 Pain in right knee: Secondary | ICD-10-CM

## 2021-04-02 DIAGNOSIS — E785 Hyperlipidemia, unspecified: Secondary | ICD-10-CM | POA: Diagnosis not present

## 2021-04-02 DIAGNOSIS — R296 Repeated falls: Secondary | ICD-10-CM

## 2021-04-02 DIAGNOSIS — M25562 Pain in left knee: Secondary | ICD-10-CM | POA: Diagnosis not present

## 2021-04-02 DIAGNOSIS — D638 Anemia in other chronic diseases classified elsewhere: Secondary | ICD-10-CM | POA: Diagnosis not present

## 2021-04-02 NOTE — Progress Notes (Signed)
Location:   [place] Room Number: [place] of Service:  ALF [phone]) Provider:  Veleta Miners MD  [name], MD  Patient Care Team: [name], MD as PCP - General (Family Medicine) [name] [name], [name], NP as Nurse Practitioner (Nurse Practitioner) [name], MD as Consulting Physician (Ophthalmology) [name], MD as Consulting Physician (Orthopedic Surgery) [name], [name], MD as Consulting Physician (Dermatology) [name], MD (Inactive) as Consulting Physician (Gastroenterology) [name], MD as Consulting Physician (General Surgery)  Extended Emergency Contact Information Primary Emergency Contact: Vicenta Dunning, [place] of [employer] Phone: [phone] Relation: Daughter  Code Status:  Full Code Managed Care Goals of care: Advanced Directive information Advanced Directives 04/02/2021  Does Patient Have a Medical Advance Directive? Yes  Type of Paramedic of [place];Living will  Does patient want to make changes to medical advance directive? No - Patient declined  Copy of [place] in Chart? Yes - validated most recent copy scanned in chart (See row information)  Pre-existing out of facility DNR order (yellow form or pink MOST form) Pink MOST form placed in chart (order not valid for inpatient use)     Chief Complaint  Patient presents with   Medical Management of Chronic Issues    HPI:  Pt is a 88 y.o. female seen today for medical management of chronic diseases.    Patient has a history of hypothyroidism, B12 deficiency, hyperlipidemia, CKD stage III, osteopenia and  aortic stenosis and arthritis H/O SBO due to Left Incarcerated Femoral Hernia.  Requiring repair with mesh.  Patient seen in her room in assisted living.for Routine visit    [place] recently [place] moved [place].  Patient was very upset she said that she  was tricked into coming into AL.  I discussed this with the nurses and therapy and the social worker Per them patient had a fall in 02/15/21 and was having some cognitive issues and her daughter and her agreed that they have to move to need more assistance.  Patient also complained of pain in her knees.  She said nobody has ever told her that she has arthritis.  She continues to walk with her walker. No shortness of breath or chest pain.  Or any other acute issues  Past Medical History:  Diagnosis Date   Cerebral atrophy ([place]) 01/21/2013   Hyperlipidemia    Hypothyroid    Osteoarthrosis, unspecified whether generalized or localized, unspecified site    Osteoporosis, unspecified    Other and unspecified hyperlipidemia    Other generalized ischemic cerebrovascular disease 01/21/2013   small vessel disease   Syncope and collapse    Umbilical hernia without mention of obstruction or gangrene    Unspecified hearing loss    Unspecified hypothyroidism    Unspecified vitamin D deficiency    Ventral hernia, unspecified, without mention of obstruction or gangrene    Past Surgical History:  Procedure Laterality Date   CATARACT EXTRACTION W/ INTRAOCULAR LENS  IMPLANT, BILATERAL  2012   Dr. Bing Plume   HERNIA REPAIR  01/2008   laparoscopic Dr. [name]   INGUINAL HERNIA REPAIR N/A 05/10/2019   Procedure: LAPAROSCOPIC FEMORAL HERNIA REPAIR WITH MESH;  Surgeon: Ralene Ok, MD;  Location: [facility];  Service: General;  Laterality: N/A;   KNEE SURGERY  2008   Dr. Alyssa Grove  left   TOE AMPUTATION  1986   little toe right foot  Dr. Dema Severin; [place]    Allergies  Allergen Reactions   Dust Mite Extract    Mushroom Extract Complex Other (See Comments)    "Allergic," per paperwork from facility   Penicillins Diarrhea and Other (See Comments)    "Allergic," per paperwork from facility    Allergies as of 04/02/2021       Reactions   Dust Mite Extract    Mushroom Extract Complex Other (See  Comments)   "Allergic," per paperwork from facility   Penicillins Diarrhea, Other (See Comments)   "Allergic," per paperwork from facility        Medication List        Accurate as of April 02, 2021  3:42 PM. If you have any questions, ask your nurse or doctor.          acetaminophen 500 MG tablet Commonly known as: TYLENOL Take 2 tablets (1,000 mg total) by mouth every 6 (six) hours as needed.   aspirin 81 MG tablet Take 81 mg by mouth daily.   Calcium Carbonate-Vitamin D 600-200 MG-UNIT Tabs Take 1 tablet by mouth 2 (two) times daily.   Fish Oil 1200 MG Caps Take 1 capsule by mouth daily.   HYDROCORTISONE (TOPICAL) 2 % Lotn Apply topically daily.   levothyroxine 75 MCG tablet Commonly known as: SYNTHROID Take one tablet daily.   multivitamin with minerals Tabs tablet Take 1 tablet by mouth daily.   sennosides-docusate sodium 8.6-50 MG tablet Commonly known as: SENOKOT-S Take 2 tablets by mouth daily.        Review of Systems  Constitutional:  Negative for activity change and appetite change.  HENT: Negative.    Respiratory:  Negative for cough and shortness of breath.   Cardiovascular:  Negative for leg swelling.  Gastrointestinal:  Negative for constipation.  Genitourinary: Negative.   Musculoskeletal:  Positive for arthralgias, gait problem and myalgias.  Skin: Negative.   Neurological:  Negative for dizziness and weakness.  Psychiatric/Behavioral:  Positive for confusion and dysphoric mood. Negative for sleep disturbance. The patient is nervous/anxious.    Immunization History  Administered Date(s) Administered   Influenza Whole 10/28/2011, 11/10/2012, 10/29/2017   Influenza, High Dose Seasonal PF 11/05/2016, 11/11/2018, 11/12/2020   Influenza-Unspecified 11/14/2013, 10/26/2014, 11/08/2015, 11/09/2019   Moderna Sars-Covid-2 Vaccination 01/31/2019, 02/28/2019, 12/06/2019, 06/26/2020   PFIZER(Purple Top)SARS-COV-2 Vaccination 10/16/2020    Pneumococcal Conjugate-13 02/05/2017   Pneumococcal Polysaccharide-23 02/08/2007   Td 01/28/1991, 06/12/2017   Tdap 06/12/2017   Zoster Recombinat (Shingrix) 07/09/2017, 09/15/2017   Zoster, Live 12/27/2012   Pertinent  Health Maintenance Due  Topic Date Due   INFLUENZA VACCINE  Completed   DEXA SCAN  Completed   Fall Risk 08/04/2019 09/02/2019 04/20/2020 02/15/2021 02/16/2021  Falls in the past year? 1 1 0 - -  Was there an injury with Fall? 1 1 - - -  Fall Risk Category Calculator 2 2 - - -  Fall Risk Category Moderate Moderate - - -  Patient Fall Risk Level - - - Low fall risk High fall risk   Functional Status Survey:    Vitals:   04/02/21 1530  BP: (!) 142/80  Pulse: 88  Resp: 17  Temp: (!) 97.5 F (36.4 C)  SpO2: 96%  Weight: 126 lb 9.6 oz (57.4 kg)  Height: '4\' 10"'$  (1.473 m)   Body mass index is 26.46 kg/m. Physical Exam Vitals reviewed.  Constitutional:      Appearance: Normal appearance.  HENT:  Head: Normocephalic.     Nose: Nose normal.     Mouth/Throat:     Mouth: Mucous membranes are moist.     Pharynx: Oropharynx is clear.  Eyes:     Pupils: Pupils are equal, round, and reactive to light.  Cardiovascular:     Rate and Rhythm: Normal rate and regular rhythm.     Pulses: Normal pulses.     Heart sounds: Murmur heard.  Pulmonary:     Effort: Pulmonary effort is normal.     Breath sounds: Normal breath sounds.  Abdominal:     General: Abdomen is flat. Bowel sounds are normal.     Palpations: Abdomen is soft.  Musculoskeletal:        General: No swelling.     Cervical back: Neck supple.     Comments: Both Knee examined No Swelling Or redness or Warm  Skin:    General: Skin is warm.  Neurological:     General: No focal deficit present.     Mental Status: She is alert and oriented to person, place, and time.     Comments: But gets Confused more now of the Events  Psychiatric:        Mood and Affect: Mood normal.        Thought Content: Thought  content normal.    Labs reviewed: Recent Labs    09/10/20 1046 02/15/21 1308 02/19/21 0000 03/05/21 0000  NA 143 137 140 140  K 4.3 3.7 4.1 4.4  CL 108 102 105 106  CO2 27 24 32* 26*  GLUCOSE 91 122*  --   --   BUN 19 19 24* 19  CREATININE 1.09* 1.07* 1.0 1.1  CALCIUM 9.1 9.1 8.7 8.9   Recent Labs    09/10/20 1046 02/15/21 1308 02/19/21 0000 03/05/21 0000  AST 14 74* 20 13  ALT '11 29 22 9  '$ ALKPHOS  --  84 66 76  BILITOT 0.5 0.7  --   --   PROT 6.7 8.3*  --   --   ALBUMIN  --  3.9 3.2* 3.3*   Recent Labs    09/10/20 1046 02/15/21 1308 02/19/21 0000 03/05/21 0000  WBC 5.9 10.4 7.6 5.0  NEUTROABS 3,280  --  5,464.00 3,005.00  HGB 12.5 13.4 10.9* 10.8*  HCT 38.5 39.3 33* 32*  MCV 91.7 89.9  --   --   PLT 292 408* 387 409*   Lab Results  Component Value Date   TSH 0.71 09/10/2020   Lab Results  Component Value Date   HGBA1C 5.9 (H) 01/31/2020   Lab Results  Component Value Date   CHOL 205 (H) 11/22/2020   HDL 65 11/22/2020   LDLCALC 110 (H) 11/22/2020   TRIG 180 (H) 11/22/2020   CHOLHDL 3.2 11/22/2020    Significant Diagnostic Results in last 30 days:  No results found.  Assessment/Plan 1. Chronic pain of both knees Xray of Knee Exam is Benign Most likely Arthritis  2. Recurrent falls Working with therapy Now in AL for more support  3. Anemia, chronic disease Did have some Rectal Bleeding Few weeks ago Will repeat CBC in few weeks  4. Hypothyroidism, unspecified type TSH normal in 8/22 Need to rpeat  5. Cognitive impairment Do MMSE  6. Dyslipidemia Off statin  7. Aortic valve stenosis, etiology of cardiac valve disease unspecified Follows with Cardiology No Symptoms    Family/ staff Communication:   Labs/tests ordered:  CBC,TSH  BMP in 4 weeks Also  Needs MMSE

## 2021-04-03 DIAGNOSIS — M25562 Pain in left knee: Secondary | ICD-10-CM | POA: Diagnosis not present

## 2021-04-03 DIAGNOSIS — M25561 Pain in right knee: Secondary | ICD-10-CM | POA: Diagnosis not present

## 2021-04-04 NOTE — Progress Notes (Deleted)
?  ?Cardiology Office Note ? ? ?Date:  04/04/2021  ? ?ID:  Krista Bowman, DOB July 10, 1931, MRN [id number] ? ?PCP:  [name], MD  ?Cardiologist:   None ? ? ?No chief complaint on file. ? ? ? ?  ?History of Present Illness: ?Krista Bowman is a 88 y.o. female who was referred by [name], MD for evaulation of aortic stenosis.  She was noted recently to have a murmur.  She had an echo in Sept. This demonstrated a well-preserved ejection fraction.  The aortic valve mean gradient was 37 in March 2021.  She lives at [facility]  She walks with a walker and lives in independent living.  ? ?Since I last saw her *** ? ? ?***  she has done okay.  She appears to have some memory issues that she does not remember much of her conversation previously.  She walks to the dining hall once daily by her report denies any cardiovascular symptoms.  The patient denies any new symptoms such as chest discomfort, neck or arm discomfort. There has been no new shortness of breath, PND or orthopnea. There have been no reported palpitations, presyncope or syncope.  ? ? ?Past Medical History:  ?Diagnosis Date  ? Cerebral atrophy ([place]) 01/21/2013  ? Hyperlipidemia   ? Hypothyroid   ? Osteoarthrosis, unspecified whether generalized or localized, unspecified site   ? Osteoporosis, unspecified   ? Other and unspecified hyperlipidemia   ? Other generalized ischemic cerebrovascular disease 01/21/2013  ? small vessel disease  ? Syncope and collapse   ? Umbilical hernia without mention of obstruction or gangrene   ? Unspecified hearing loss   ? Unspecified hypothyroidism   ? Unspecified vitamin D deficiency   ? Ventral hernia, unspecified, without mention of obstruction or gangrene   ? ? ?Past Surgical History:  ?Procedure Laterality Date  ? CATARACT EXTRACTION W/ INTRAOCULAR LENS  IMPLANT, BILATERAL  2012  ? Dr. Bing Plume  ? HERNIA REPAIR  01/2008  ? laparoscopic Dr. [name]  ? INGUINAL HERNIA REPAIR N/A 05/10/2019  ? Procedure:  LAPAROSCOPIC FEMORAL HERNIA REPAIR WITH MESH;  Surgeon: Ralene Ok, MD;  Location: [place];  Service: General;  Laterality: N/A;  ? KNEE SURGERY  2008  ? Dr. Alyssa Grove  left  ? TOE AMPUTATION  1986  ? little toe right foot  Dr. Dema Severin; [place]  ? ? ? ?Current Outpatient Medications  ?Medication Sig Dispense Refill  ? acetaminophen (TYLENOL) 500 MG tablet Take 2 tablets (1,000 mg total) by mouth every 6 (six) hours as needed. 30 tablet 0  ? aspirin 81 MG tablet Take 81 mg by mouth daily.    ? Calcium Carbonate-Vitamin D 600-200 MG-UNIT TABS Take 1 tablet by mouth 2 (two) times daily.    ? HYDROCORTISONE, TOPICAL, 2 % LOTN Apply topically daily.    ? levothyroxine (SYNTHROID) 75 MCG tablet Take one tablet daily. 90 tablet 1  ? Multiple Vitamin (MULTIVITAMIN WITH MINERALS) TABS Take 1 tablet by mouth daily.    ? Omega-3 Fatty Acids (FISH OIL) 1200 MG CAPS Take 1 capsule by mouth daily.    ? sennosides-docusate sodium (SENOKOT-S) 8.6-50 MG tablet Take 2 tablets by mouth daily.    ? ?No current facility-administered medications for this visit.  ? ? ?Allergies:   Dust mite extract, Mushroom extract complex, and Penicillins  ? ? ?ROS:  Please see the history of present illness.   Otherwise, review of systems are positive for ***.  All other systems are reviewed and negative.  ? ? ?PHYSICAL EXAM: ?VS:  There were no vitals taken for this visit. , BMI There is no height or weight on file to calculate BMI. ?GENERAL:  Well appearing ?NECK:  No jugular venous distention, waveform within normal limits, carotid upstroke brisk and symmetric, no bruits, no thyromegaly ?LUNGS:  Clear to auscultation bilaterally ?CHEST:  Unremarkable ?HEART:  PMI not displaced or sustained,S1 and S2 within normal limits, no S3, no S4, no clicks, no rubs, *** murmurs ?ABD:  Flat, positive bowel sounds normal in frequency in pitch, no bruits, no rebound, no guarding, no midline pulsatile mass, no hepatomegaly, no splenomegaly ?EXT:  2 plus pulses  throughout, no edema, no cyanosis no clubbing' ? ? ?***GEN:   Slightly frail appearing ?NECK:  No jugular venous distention at 90 degrees, waveform within normal limits, carotid upstroke brisk and symmetric, no bruits, no thyromegaly ?LYMPHATICS:  No cervical adenopathy ?LUNGS:  Clear to auscultation bilaterally ?BACK:  No CVA tenderness, scoliosis ?CHEST:  Unremarkable ?HEART:  S1 and S2 within normal limits, no S3, no S4, no clicks, no rubs, 3 out of 6 apical systolic murmur radiating slightly at the aortic outflow tract, no diastolic murmurs ?ABD:  Positive bowel sounds normal in frequency in pitch, no bruits, no rebound, no guarding, unable to assess midline mass or bruit with the patient seated. ?EXT:  2 plus pulses throughout, no edema, no cyanosis no clubbing ?SKIN:  No rashes no nodules ?NEURO:  Cranial nerves II through XII grossly intact, motor grossly intact throughout ?PSYCH:  Cognitively intact, oriented to person place and time ? ?EKG:  EKG is *** ordered today. ?*** ? ? ?Recent Labs: ?09/10/2020: TSH 0.71 ?03/05/2021: ALT 9; BUN 19; Creatinine 1.1; Hemoglobin 10.8; Platelets 409; Potassium 4.4; Sodium 140  ? ? ?Lipid Panel ?   ?Component Value Date/Time  ? CHOL 205 (H) 11/22/2020 0700  ? TRIG 180 (H) 11/22/2020 0700  ? HDL 65 11/22/2020 0700  ? CHOLHDL 3.2 11/22/2020 0700  ? LDLCALC 110 (H) 11/22/2020 0700  ? ?  ? ?Wt Readings from Last 3 Encounters:  ?04/02/21 126 lb 9.6 oz (57.4 kg)  ?03/04/21 127 lb 9.6 oz (57.9 kg)  ?02/20/21 135 lb (61.2 kg)  ?  ? ? ?Other studies Reviewed: ?Additional studies/ records that were reviewed today include:  ***  ?Review of the above records demonstrates:    Please see elsewhere in the note.   ? ? ?ASSESSMENT AND PLAN: ? ?Aortic Stenosis:   ***   This was moderate and she is not having any new symptoms.  I am going to follow this up with an echocardiogram in the spring and I will see her after that.  ? ?Current medicines are reviewed at length with the patient today.   The patient does not have concerns regarding medicines. ? ?The following changes have been made:  *** ? ?Labs/ tests ordered today include:  *** ? ?No orders of the defined types were placed in this encounter. ? ? ?Disposition:   FU with me in *** months.   ? ? ? ?Signed, ?Minus Breeding, MD  ?04/04/2021 7:24 PM    ?[place] ?

## 2021-04-05 ENCOUNTER — Telehealth: Payer: Self-pay | Admitting: *Deleted

## 2021-04-05 ENCOUNTER — Ambulatory Visit: Payer: PPO | Admitting: Cardiology

## 2021-04-05 DIAGNOSIS — I350 Nonrheumatic aortic (valve) stenosis: Secondary | ICD-10-CM

## 2021-04-05 NOTE — Telephone Encounter (Signed)
Patient did not show for her appointment today ?Spoke with [facility] and she is now in the Assisted Living, they did not have her appointments down ?Rescheduled Echo and OV  ?

## 2021-04-10 ENCOUNTER — Encounter: Payer: Self-pay | Admitting: Cardiology

## 2021-04-17 ENCOUNTER — Ambulatory Visit (HOSPITAL_COMMUNITY): Payer: PPO | Attending: Internal Medicine

## 2021-04-17 ENCOUNTER — Other Ambulatory Visit: Payer: Self-pay

## 2021-04-17 DIAGNOSIS — I350 Nonrheumatic aortic (valve) stenosis: Secondary | ICD-10-CM | POA: Insufficient documentation

## 2021-04-17 LAB — ECHOCARDIOGRAM COMPLETE
AR max vel: 0.65 cm2
AV Area VTI: 0.64 cm2
AV Area mean vel: 0.62 cm2
AV Mean grad: 43 mmHg
AV Peak grad: 70.2 mmHg
Ao pk vel: 4.19 m/s
Area-P 1/2: 3.3 cm2
P 1/2 time: 387 msec
S' Lateral: 2.8 cm

## 2021-04-18 DIAGNOSIS — E039 Hypothyroidism, unspecified: Secondary | ICD-10-CM | POA: Diagnosis not present

## 2021-04-18 DIAGNOSIS — N189 Chronic kidney disease, unspecified: Secondary | ICD-10-CM | POA: Diagnosis not present

## 2021-04-18 LAB — BASIC METABOLIC PANEL
BUN: 20 (ref 4–21)
CO2: 25 — AB (ref 13–22)
Chloride: 105 (ref 99–108)
Creatinine: 1 (ref 0.5–1.1)
Glucose: 96
Potassium: 4.3 mEq/L (ref 3.5–5.1)
Sodium: 142 (ref 137–147)

## 2021-04-18 LAB — COMPREHENSIVE METABOLIC PANEL
Albumin: 3.9 (ref 3.5–5.0)
Calcium: 9.3 (ref 8.7–10.7)
Globulin: 2.8
eGFR: 53

## 2021-04-18 LAB — CBC AND DIFFERENTIAL
HCT: 37 (ref 36–46)
Hemoglobin: 12.5 (ref 12.0–16.0)
Platelets: 330 10*3/uL (ref 150–400)
WBC: 5.5

## 2021-04-18 LAB — HEPATIC FUNCTION PANEL
ALT: 8 U/L (ref 7–35)
AST: 15 (ref 13–35)
Alkaline Phosphatase: 81 (ref 25–125)
Bilirubin, Total: 0.4

## 2021-04-18 LAB — TSH: TSH: 10.38 — AB (ref 0.41–5.90)

## 2021-04-18 LAB — CBC: RBC: 4.07 (ref 3.87–5.11)

## 2021-04-19 ENCOUNTER — Non-Acute Institutional Stay: Payer: PPO | Admitting: Nurse Practitioner

## 2021-04-19 ENCOUNTER — Encounter: Payer: Self-pay | Admitting: Nurse Practitioner

## 2021-04-19 DIAGNOSIS — N1831 Chronic kidney disease, stage 3a: Secondary | ICD-10-CM

## 2021-04-19 DIAGNOSIS — F03A0 Unspecified dementia, mild, without behavioral disturbance, psychotic disturbance, mood disturbance, and anxiety: Secondary | ICD-10-CM | POA: Diagnosis not present

## 2021-04-19 DIAGNOSIS — I350 Nonrheumatic aortic (valve) stenosis: Secondary | ICD-10-CM

## 2021-04-19 DIAGNOSIS — R350 Frequency of micturition: Secondary | ICD-10-CM

## 2021-04-19 DIAGNOSIS — E039 Hypothyroidism, unspecified: Secondary | ICD-10-CM | POA: Diagnosis not present

## 2021-04-19 DIAGNOSIS — M159 Polyosteoarthritis, unspecified: Secondary | ICD-10-CM

## 2021-04-19 DIAGNOSIS — D638 Anemia in other chronic diseases classified elsewhere: Secondary | ICD-10-CM

## 2021-04-19 DIAGNOSIS — E782 Mixed hyperlipidemia: Secondary | ICD-10-CM

## 2021-04-19 NOTE — Progress Notes (Signed)
?Location:   [facility] [name] ?Nursing Home Room Number: 765 [id number] ?Place of Service:  ALF (13) ?Provider:  Maritsa Hunsucker X, NP ? ?[name], MD ? ?Patient Care Team: ?[name], MD as PCP - General (Family Medicine) ?[name] ?Tayten Bergdoll X, NP as Nurse Practitioner (Nurse Practitioner) ?[name], MD as Consulting Physician (Ophthalmology) ?[name], MD as Consulting Physician (Orthopedic Surgery) ?[name], [name], MD as Consulting Physician (Dermatology) ?[name], MD (Inactive) as Consulting Physician (Gastroenterology) ?[name], MD as Consulting Physician (General Surgery) ? ?Extended Emergency Contact Information ?Primary Emergency Contact: Lamke,Debra ?         [place], [place] ?Mobile Phone: [phone] ?Relation: Daughter ? ?Code Status:  FULL CODE ?Goals of care: Advanced Directive information ? ?  04/19/2021  ?  2:42 PM  ?Advanced Directives  ?Does Patient Have a Medical Advance Directive? Yes  ?Type of Advance Directive Living will;Out of facility DNR (pink MOST or yellow form);Healthcare Power of Attorney  ?Does patient want to make changes to medical advance directive? No - Patient declined  ?Copy of [place] in Chart? No - copy requested  ?Pre-existing out of facility DNR order (yellow form or pink MOST form) Pink MOST form placed in chart (order not valid for inpatient use)  ? ? ? ?Chief Complaint  ?Patient presents with  ? Acute Visit  ?  Elevated TSH  ? ? ?HPI:  ?Pt is a 88 y.o. female seen today for an acute visit for elevated TSH.  ? Chronic pain, improved R+L hips, 02/20/21 X-ray C-spine, no acute abnormality. X-ray R femur no acute abnormality. X-ray R hip OA. X-ray T spine no acute abnormality. X-ray L spine: degenerative changes. 04/03/21 X-ray R+L  knee no acute bone abnormality. Tylenol is adequate. Ambulates with walker.  ?Hx of syncope,  aortic valve stenosis ?CKD Bun/creat 19/1.1 03/05/21 ?Hyperlipidemia,  LDL 110 11/22/20 ?Hypothyroidism, increased Levothyroxine 04/18/21, TSH 0.71 09/10/20<< 10.38 04/18/21 ?Dementia, mild, no behavioral issues.  ?            Anemia, Hgb 10.8 03/05/21,  takes ASA '81mg'$  qd.  ? Urinary frequency, OAB, not new, up to 3-4x per night, declined urology consultation, Myrbetriq '25mg'$  qd if descries.  ? ? ?Past Medical History:  ?Diagnosis Date  ? Cerebral atrophy ([place]) 01/21/2013  ? Hyperlipidemia   ? Hypothyroid   ? Osteoarthrosis, unspecified whether generalized or localized, unspecified site   ? Osteoporosis, unspecified   ? Other and unspecified hyperlipidemia   ? Other generalized ischemic cerebrovascular disease 01/21/2013  ? small vessel disease  ? Syncope and collapse   ? Umbilical hernia without mention of obstruction or gangrene   ? Unspecified hearing loss   ? Unspecified hypothyroidism   ? Unspecified vitamin D deficiency   ? Ventral hernia, unspecified, without mention of obstruction or gangrene   ? ?Past Surgical History:  ?Procedure Laterality Date  ? CATARACT EXTRACTION W/ INTRAOCULAR LENS  IMPLANT, BILATERAL  2012  ? Dr. Bing Plume  ? HERNIA REPAIR  01/2008  ? laparoscopic Dr. [name]  ? INGUINAL HERNIA REPAIR N/A 05/10/2019  ? Procedure: LAPAROSCOPIC FEMORAL HERNIA REPAIR WITH MESH;  Surgeon: Ralene Ok, MD;  Location: [facility];  Service: General;  Laterality: N/A;  ? KNEE SURGERY  2008  ? Dr. Alyssa Grove  left  ? TOE AMPUTATION  1986  ? little toe right foot  Dr. Dema Severin; [place]  ? ? ?Allergies  ?Allergen Reactions  ?  Dust Mite Extract   ? Mushroom Extract Complex Other (See Comments)  ?  "Allergic," per paperwork from facility  ? Penicillins Diarrhea and Other (See Comments)  ?  "Allergic," per paperwork from facility  ? ? ?Allergies as of 04/19/2021   ? ?   Reactions  ? Dust Mite Extract   ? Mushroom Extract Complex Other (See Comments)  ? "Allergic," per paperwork from facility  ? Penicillins Diarrhea, Other (See Comments)  ? "Allergic," per paperwork from facility  ? ?  ? ?   ?Medication List  ?  ? ?  ? Accurate as of April 19, 2021 11:59 PM. If you have any questions, ask your nurse or doctor.  ?  ?  ? ?  ? ?acetaminophen 500 MG tablet ?Commonly known as: TYLENOL ?Take 2 tablets (1,000 mg total) by mouth every 6 (six) hours as needed. ?  ?aspirin 81 MG tablet ?Take 81 mg by mouth daily. ?  ?Calcium Carbonate-Vitamin D 600-200 MG-UNIT Tabs ?Take 1 tablet by mouth 2 (two) times daily. ?  ?Fish Oil 1200 MG Caps ?Take 1 capsule by mouth daily. ?  ?HYDROCORTISONE (TOPICAL) 2 % Lotn ?Apply topically daily. ?  ?levothyroxine 75 MCG tablet ?Commonly known as: SYNTHROID ?Take one tablet daily. ?  ?multivitamin with minerals Tabs tablet ?Take 1 tablet by mouth daily. ?  ?sennosides-docusate sodium 8.6-50 MG tablet ?Commonly known as: SENOKOT-S ?Take 2 tablets by mouth daily. ?  ? ?  ? ? ?Review of Systems  ?Constitutional:  Positive for unexpected weight change. Negative for appetite change, fatigue and fever.  ?     #4-5Ibs weight loss in the past 5-6 months.   ?HENT:  Positive for hearing loss. Negative for congestion and voice change.   ?Respiratory:  Negative for cough.   ?Cardiovascular:  Negative for leg swelling.  ?Gastrointestinal:  Negative for abdominal pain and constipation.  ?Genitourinary:  Positive for frequency. Negative for dysuria and urgency.  ?Musculoskeletal:  Positive for arthralgias and gait problem.  ?     Right femur pain after fall, X-ray negative for fx, better.   ?Skin:  Negative for color change.  ?Neurological:  Negative for speech difficulty, weakness and light-headedness.  ?     Memory lapses.   ?Psychiatric/Behavioral:  Negative for behavioral problems and sleep disturbance. The patient is not nervous/anxious.   ? ?Immunization History  ?Administered Date(s) Administered  ? Influenza Whole 10/28/2011, 11/10/2012, 10/29/2017  ? Influenza, High Dose Seasonal PF 11/05/2016, 11/11/2018, 11/12/2020  ? Influenza-Unspecified 11/14/2013, 10/26/2014, 11/08/2015, 11/09/2019   ? Moderna Sars-Covid-2 Vaccination 01/31/2019, 02/28/2019, 12/06/2019, 06/26/2020  ? PFIZER(Purple Top)SARS-COV-2 Vaccination 10/16/2020  ? Pneumococcal Conjugate-13 02/05/2017  ? Pneumococcal Polysaccharide-23 02/08/2007  ? Td 01/28/1991, 06/12/2017  ? Tdap 06/12/2017  ? Zoster Recombinat (Shingrix) 07/09/2017, 09/15/2017  ? Zoster, Live 12/27/2012  ? ?Pertinent  Health Maintenance Due  ?Topic Date Due  ? INFLUENZA VACCINE  Completed  ? DEXA SCAN  Completed  ? ? ?  08/04/2019  ?  1:10 PM 09/02/2019  ? 12:05 PM 04/20/2020  ? 10:57 AM 02/15/2021  ? 12:46 PM 02/16/2021  ?  2:41 AM  ?Fall Risk  ?Falls in the past year? 1 1 0    ?Was there an injury with Fall? 1 1     ?Fall Risk Category Calculator 2 2     ?Fall Risk Category Moderate Moderate     ?Patient Fall Risk Level    Low fall risk High fall risk  ? ?Functional Status  Survey: ?  ? ?Vitals:  ? 04/19/21 1439  ?BP: 112/60  ?Pulse: 85  ?Resp: 18  ?Temp: 98.1 ?F (36.7 ?C)  ?SpO2: 95%  ?Weight: 126 lb 9.6 oz (57.4 kg)  ?Height: '4\' 10"'$  (1.473 m)  ? ?Body mass index is 26.46 kg/m?Marland Kitchen ?Physical Exam ?Constitutional:   ?   Appearance: Normal appearance.  ?HENT:  ?   Head: Normocephalic and atraumatic.  ?   Mouth/Throat:  ?   Mouth: Mucous membranes are moist.  ?Eyes:  ?   Extraocular Movements: Extraocular movements intact.  ?   Conjunctiva/sclera: Conjunctivae normal.  ?   Pupils: Pupils are equal, round, and reactive to light.  ?Cardiovascular:  ?   Rate and Rhythm: Normal rate and regular rhythm.  ?   Heart sounds: Murmur heard.  ?Pulmonary:  ?   Effort: Pulmonary effort is normal.  ?   Breath sounds: Normal breath sounds. No rales.  ?Abdominal:  ?   General: Bowel sounds are normal.  ?   Palpations: Abdomen is soft.  ?   Tenderness: There is no abdominal tenderness. There is no right CVA tenderness, left CVA tenderness, guarding or rebound.  ?Genitourinary: ?   Rectum: Normal.  ?   Comments: Bright blood in anal rectal area, no hemorrhoids, hard stools felt on my digital  rectal exam.  ?Musculoskeletal:  ?   Cervical back: Normal range of motion and neck supple.  ?   Right lower leg: No edema.  ?   Left lower leg: No edema.  ?   Comments: Unsteady gait. C/o R+L hip pain when she

## 2021-04-20 IMAGING — CR DG CHEST 2V
2 series · 2 of 2 positions shown · non-contrast
Comparison: Chest x-ray dated May 10, 2019.

CLINICAL DATA: Fall.

EXAM:
CHEST - 2 VIEW

[w chest lat]
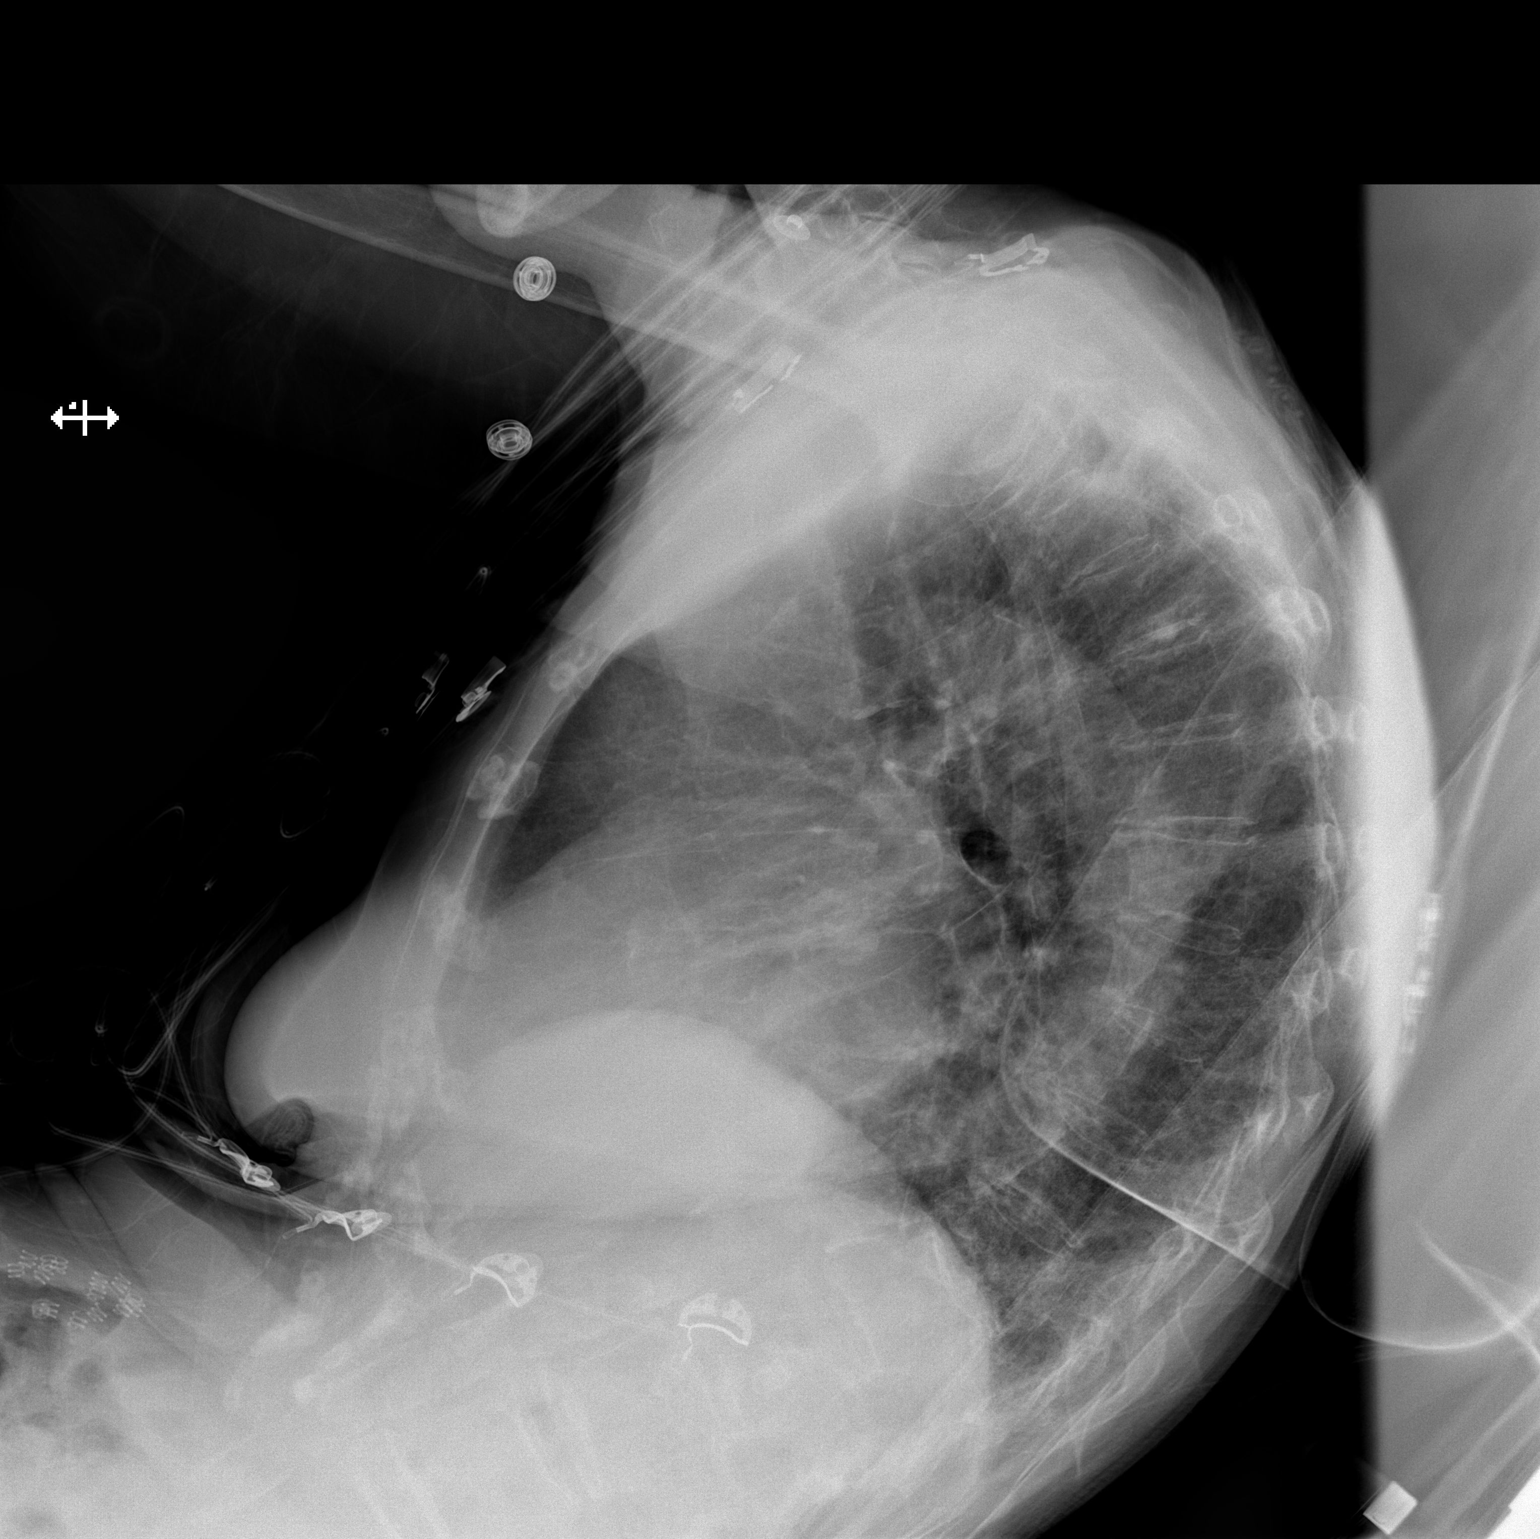

[x chest ap]
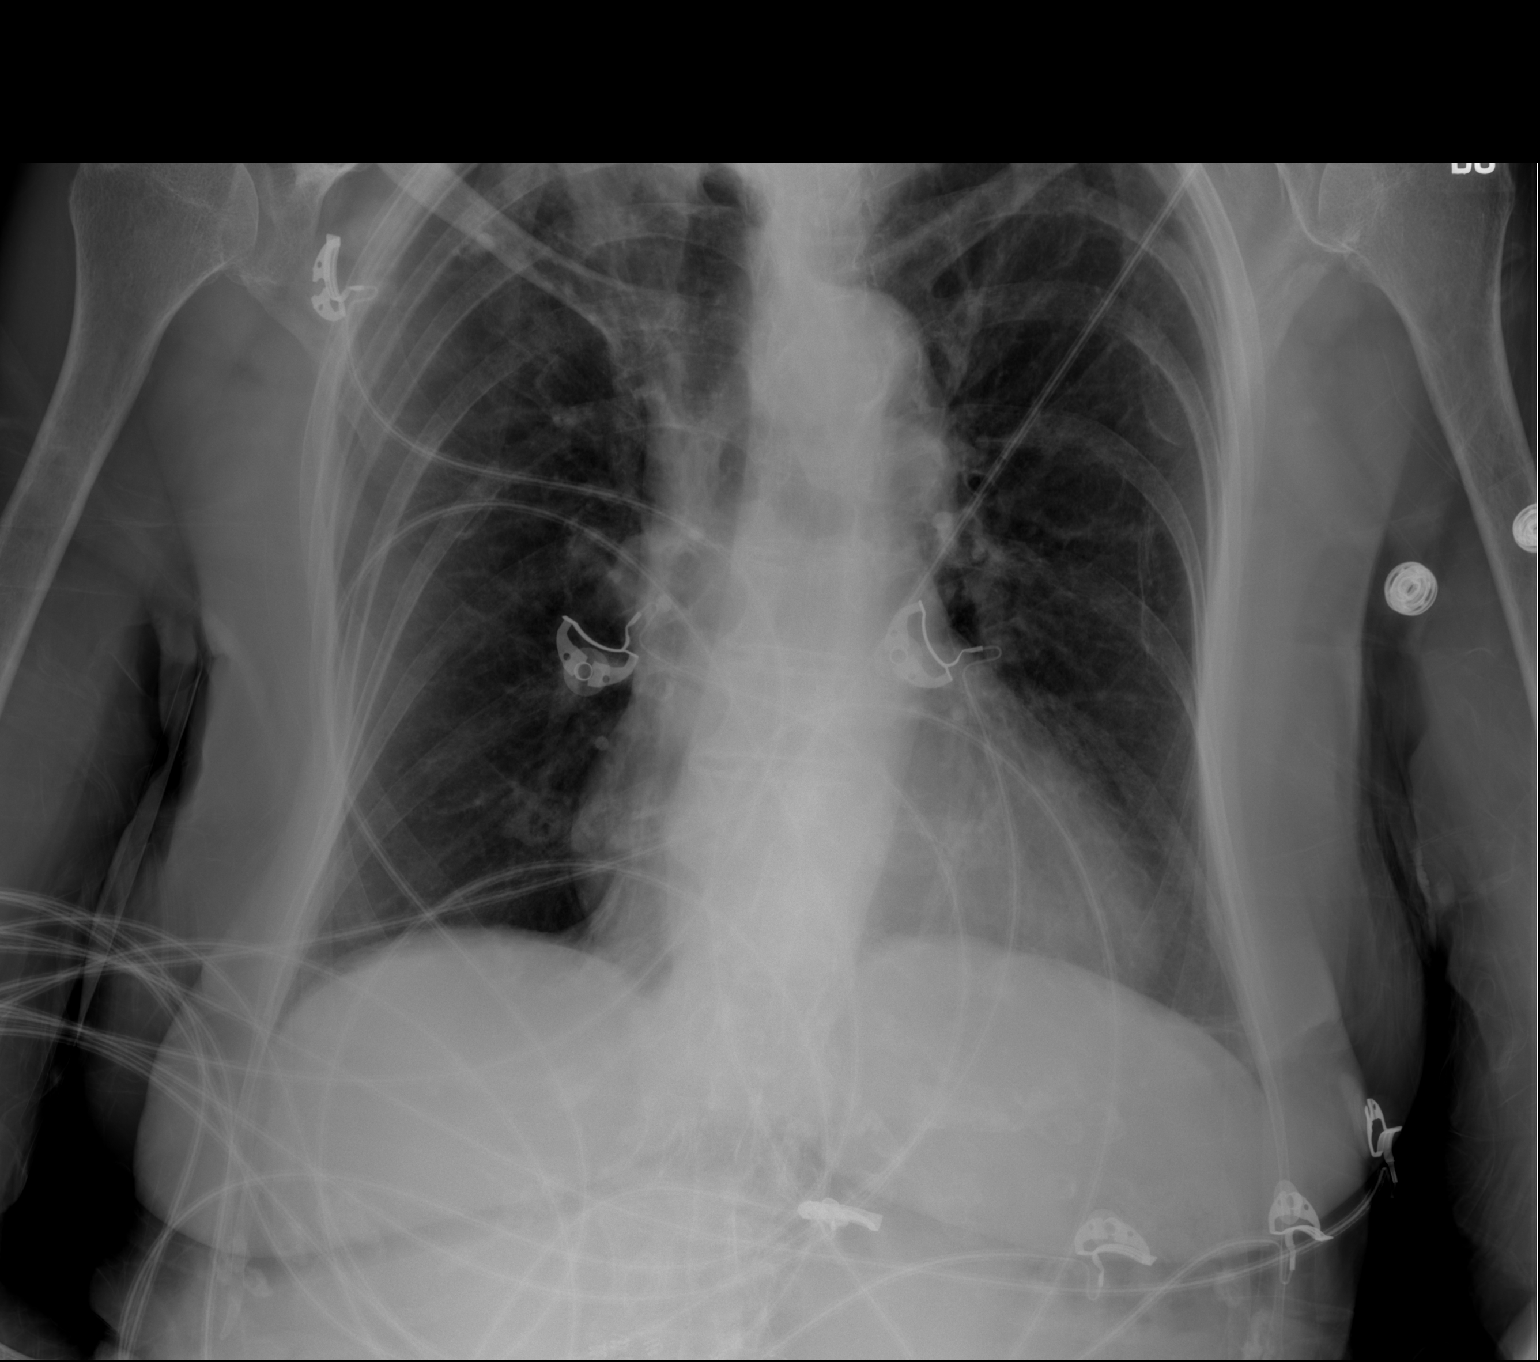

[2 of 2 positions shown; findings below may reference images not displayed]

FINDINGS: Unchanged mild cardiomegaly. Atherosclerotic calcification of the
aortic arch. Normal pulmonary vascularity. No focal consolidation,
pleural effusion, or pneumothorax. Unchanged small calcified
granuloma in the right upper lobe. No acute osseous abnormality.
Chronic T10 compression deformity.
IMPRESSION: No active cardiopulmonary disease.

## 2021-04-22 ENCOUNTER — Encounter: Payer: Self-pay | Admitting: Nurse Practitioner

## 2021-04-22 DIAGNOSIS — R350 Frequency of micturition: Secondary | ICD-10-CM | POA: Insufficient documentation

## 2021-04-22 NOTE — Assessment & Plan Note (Signed)
Bun/creat 19/1.1 03/05/21 ?

## 2021-04-22 NOTE — Assessment & Plan Note (Signed)
Hgb 10.8 03/05/21,  takes ASA '81mg'$  qd.  ?

## 2021-04-22 NOTE — Assessment & Plan Note (Signed)
Hx of syncope,  aortic valve stenosis 

## 2021-04-22 NOTE — Assessment & Plan Note (Addendum)
not new, OAB, up to 3-4x per night, declined urology consultation, Myrbetriq '25mg'$  qd if descries. Denied dysuria, urinary urgency, suprapubic discomfort.  ? ?

## 2021-04-22 NOTE — Assessment & Plan Note (Signed)
LDL 110 11/22/20 ?

## 2021-04-22 NOTE — Assessment & Plan Note (Signed)
mild, no behavioral issues.  ?

## 2021-04-22 NOTE — Assessment & Plan Note (Signed)
Chronic pain, improved R+L hips, 02/20/21 X-ray C-spine, no acute abnormality. X-ray R femur no acute abnormality. X-ray R hip OA. X-ray T spine no acute abnormality. X-ray L spine: degenerative changes. Tylenol is adequate. Ambulates with walker.  ?

## 2021-04-22 NOTE — Assessment & Plan Note (Signed)
04/18/21 TSH 10.38, increase Levothyroxine 157mg qd, TSH 6 weeks.   ?

## 2021-04-24 NOTE — Progress Notes (Signed)
?  ?Cardiology Office Note ? ? ?Date:  04/25/2021  ? ?ID:  Krista Bowman, DOB 12/23/1931, MRN [id number] ? ?PCP:  [name], MD  ?Cardiologist:   None ? ? ?Chief Complaint  ?Patient presents with  ? Aortic Stenosis  ? ? ? ?  ?History of Present Illness: ?Krista Bowman is a 88 y.o. female who was referred by [name], MD for evaulation of aortic stenosis.  She was noted recently to have a murmur.  She had an echo in Sept. This demonstrated a well-preserved ejection fraction.  The aortic valve mean gradient was 37 in March 2021.  She lives at [facility].  She walks with a walker and lives in independent living.  ? ?Since I last saw her she has had no acute problems that I can see from the accompanying notes.  She states she is doing well in independent living to more skilled care.  She gets around slowly with a walker and has some chronic knee pain.  I do see that she has had her thyroid medicine adjusted recently.  She does not describe however any acute cardiovascular complaints. The patient denies any new symptoms such as chest discomfort, neck or arm discomfort. There has been no new shortness of breath, PND or orthopnea. There have been no reported palpitations, presyncope or syncope.  He is very clear that she has some memory problems and she does not recall our conversation previously about her aortic valve.  I did try to call her daughter was not with her today but could not get her on the phone. ? ? ?Past Medical History:  ?Diagnosis Date  ? Cerebral atrophy ([place]) 01/21/2013  ? Hyperlipidemia   ? Hypothyroid   ? Osteoarthrosis, unspecified whether generalized or localized, unspecified site   ? Osteoporosis, unspecified   ? Other and unspecified hyperlipidemia   ? Other generalized ischemic cerebrovascular disease 01/21/2013  ? small vessel disease  ? Syncope and collapse   ? Umbilical hernia without mention of obstruction or gangrene   ? Unspecified hearing loss   ? Unspecified  hypothyroidism   ? Unspecified vitamin D deficiency   ? Ventral hernia, unspecified, without mention of obstruction or gangrene   ? ? ?Past Surgical History:  ?Procedure Laterality Date  ? CATARACT EXTRACTION W/ INTRAOCULAR LENS  IMPLANT, BILATERAL  2012  ? Dr. Bing Plume  ? HERNIA REPAIR  01/2008  ? laparoscopic Dr. [name]  ? INGUINAL HERNIA REPAIR N/A 05/10/2019  ? Procedure: LAPAROSCOPIC FEMORAL HERNIA REPAIR WITH MESH;  Surgeon: Ralene Ok, MD;  Location: [place];  Service: General;  Laterality: N/A;  ? KNEE SURGERY  2008  ? Dr. Alyssa Grove  left  ? TOE AMPUTATION  1986  ? little toe right foot  Dr. Dema Severin; [place]  ? ? ? ?Current Outpatient Medications  ?Medication Sig Dispense Refill  ? acetaminophen (TYLENOL) 500 MG tablet Take 2 tablets (1,000 mg total) by mouth every 6 (six) hours as needed. 30 tablet 0  ? aspirin 81 MG tablet Take 81 mg by mouth daily.    ? Calcium Carbonate-Vitamin D 600-200 MG-UNIT TABS Take 1 tablet by mouth 2 (two) times daily.    ? levothyroxine (SYNTHROID) 100 MCG tablet Take 100 mcg by mouth daily.    ? levothyroxine (SYNTHROID) 75 MCG tablet Take one tablet daily. 90 tablet 1  ? Multiple Vitamin (MULTIVITAMIN WITH MINERALS) TABS Take 1 tablet by mouth daily.    ? Omega-3 Fatty Acids (FISH OIL) 1200  MG CAPS Take 1 capsule by mouth daily.    ? HYDROCORTISONE, TOPICAL, 2 % LOTN Apply topically daily. (Patient not taking: Reported on 04/25/2021)    ? sennosides-docusate sodium (SENOKOT-S) 8.6-50 MG tablet Take 2 tablets by mouth daily. (Patient not taking: Reported on 04/25/2021)    ? ?No current facility-administered medications for this visit.  ? ? ?Allergies:   Dust mite extract, Mushroom extract complex, and Penicillins  ? ? ?ROS:  Please see the history of present illness.   Otherwise, review of systems are positive for none.   All other systems are reviewed and negative.  ? ? ?PHYSICAL EXAM: ?VS:  BP 122/60   Pulse 78   Ht '4\' 10"'$  (1.473 m)   Wt 129 lb 9.6 oz (58.8 kg)   SpO2  98%   BMI 27.09 kg/m?  , BMI Body mass index is 27.09 kg/m?. ?GEN:  No distress, frail ?NECK:  No jugular venous distention at 90 degrees, waveform within normal limits, carotid upstroke brisk and symmetric, no bruits, no thyromegaly ?LYMPHATICS:  No cervical adenopathy ?LUNGS:  Clear to auscultation bilaterally ?BACK:  No CVA tenderness ?CHEST:  Unremarkable ?HEART:  S1 and S2 within normal limits, no S3, no S4, no clicks, no rubs, 3 out of 6 late peaking systolic murmur radiating at aortic outflow tract, no diastolic murmurs ?ABD:  Positive bowel sounds normal in frequency in pitch, no bruits, no rebound, no guarding, unable to assess midline mass or bruit with the patient seated. ?EXT:  2 plus pulses upper, diminished dorsalis pedis and posterior tibialis bilateral lower, no edema, no cyanosis no clubbing ?SKIN:  No rashes no nodules ?NEURO:  Cranial nerves II through XII grossly intact, motor grossly intact throughout ?PSYCH:  Cognitively intact, oriented to person place and time, clearly some memory deficits ? ? ?EKG:  EKG is  ordered today. ?Regular rhythm with an unusual P wave axis, possibly ectopic atrial rhythm, no acute ST-T wave changes. ? ? ?Recent Labs: ?09/10/2020: TSH 0.71 ?03/05/2021: ALT 9; BUN 19; Creatinine 1.1; Hemoglobin 10.8; Platelets 409; Potassium 4.4; Sodium 140  ? ? ?Lipid Panel ?   ?Component Value Date/Time  ? CHOL 205 (H) 11/22/2020 0700  ? TRIG 180 (H) 11/22/2020 0700  ? HDL 65 11/22/2020 0700  ? CHOLHDL 3.2 11/22/2020 0700  ? LDLCALC 110 (H) 11/22/2020 0700  ? ?  ? ?Wt Readings from Last 3 Encounters:  ?04/25/21 129 lb 9.6 oz (58.8 kg)  ?04/19/21 126 lb 9.6 oz (57.4 kg)  ?04/02/21 126 lb 9.6 oz (57.4 kg)  ?  ? ? ?Other studies Reviewed: ?Additional studies/ records that were reviewed today include:  None  ?Review of the above records demonstrates:    Please see elsewhere in the note.   ? ? ?ASSESSMENT AND PLAN: ? ?Aortic Stenosis:   Her aortic stenosis is severe.  She does not report  any symptoms.  I do think there is a memory deficit.  I think she is very frail.  My suggestion would be not to consider TAVR in this patient but I would like to try to talk to her daughter.  I left her a message. ? ?Hypothyroidism: I see that she just had this dose increased recently.  Her TSH was 10.38. ?Current medicines are reviewed at length with the patient today.  The patient does not have concerns regarding medicines. ? ?The following changes have been made: None ? ?Labs/ tests ordered today include: None ? ?Orders Placed This Encounter  ?Procedures  ?  EKG 12-Lead  ? ? ?Disposition:   FU with me in 12 months.   ? ? ? ?Signed, ?Minus Breeding, MD  ?04/25/2021 11:07 AM    ?[place] ?

## 2021-04-25 ENCOUNTER — Encounter: Payer: Self-pay | Admitting: Cardiology

## 2021-04-25 ENCOUNTER — Ambulatory Visit (INDEPENDENT_AMBULATORY_CARE_PROVIDER_SITE_OTHER): Payer: PPO | Admitting: Cardiology

## 2021-04-25 VITALS — BP 122/60 | HR 78 | Ht <= 58 in | Wt 129.6 lb

## 2021-04-25 DIAGNOSIS — I350 Nonrheumatic aortic (valve) stenosis: Secondary | ICD-10-CM

## 2021-04-25 NOTE — Patient Instructions (Signed)
Medication Instructions:  ?No changes ?*If you need a refill on your cardiac medications before your next appointment, please call your pharmacy* ? ? ?Lab Work: ?None ordered ?If you have labs (blood work) drawn today and your tests are completely normal, you will receive your results only by: ?MyChart Message (if you have MyChart) OR ?A paper copy in the mail ?If you have any lab test that is abnormal or we need to change your treatment, we will call you to review the results. ? ? ?Testing/Procedures: ?None ordered ? ? ?Follow-Up: ?At [facility], you and your health needs are our priority.  As part of our continuing mission to provide you with exceptional heart care, we have created designated Provider Care Teams.  These Care Teams include your primary Cardiologist (physician) and Advanced Practice Providers (APPs -  Physician Assistants and Nurse Practitioners) who all work together to provide you with the care you need, when you need it. ? ?We recommend signing up for the patient portal called "MyChart".  Sign up information is provided on this After Visit Summary.  MyChart is used to connect with patients for Virtual Visits (Telemedicine).  Patients are able to view lab/test results, encounter notes, upcoming appointments, etc.  Non-urgent messages can be sent to your provider as well.   ?To learn more about what you can do with MyChart, go to [email or web address].   ? ?Your next appointment:   ?12 month(s) ? ?The format for your next appointment:   ?In Person ? ?Provider:   ?Dr. Percival Spanish ?

## 2021-04-30 DIAGNOSIS — Z9181 History of falling: Secondary | ICD-10-CM | POA: Diagnosis not present

## 2021-04-30 DIAGNOSIS — M6281 Muscle weakness (generalized): Secondary | ICD-10-CM | POA: Diagnosis not present

## 2021-04-30 DIAGNOSIS — M25562 Pain in left knee: Secondary | ICD-10-CM | POA: Diagnosis not present

## 2021-04-30 DIAGNOSIS — M25561 Pain in right knee: Secondary | ICD-10-CM | POA: Diagnosis not present

## 2021-05-27 DIAGNOSIS — M6281 Muscle weakness (generalized): Secondary | ICD-10-CM | POA: Diagnosis not present

## 2021-05-27 DIAGNOSIS — M25561 Pain in right knee: Secondary | ICD-10-CM | POA: Diagnosis not present

## 2021-05-27 DIAGNOSIS — M25562 Pain in left knee: Secondary | ICD-10-CM | POA: Diagnosis not present

## 2021-05-27 DIAGNOSIS — Z9181 History of falling: Secondary | ICD-10-CM | POA: Diagnosis not present

## 2021-05-30 DIAGNOSIS — E039 Hypothyroidism, unspecified: Secondary | ICD-10-CM | POA: Diagnosis not present

## 2021-05-30 LAB — TSH: TSH: 0.64 (ref 0.41–5.90)

## 2021-06-22 IMAGING — MG DIGITAL SCREENING BILAT W/ CAD
4 series · 4 of 4 positions shown · non-contrast
Comparison: Previous exam(s).

CLINICAL DATA: Screening.

EXAM:
DIGITAL SCREENING BILATERAL MAMMOGRAM WITH CAD

[L MLO]
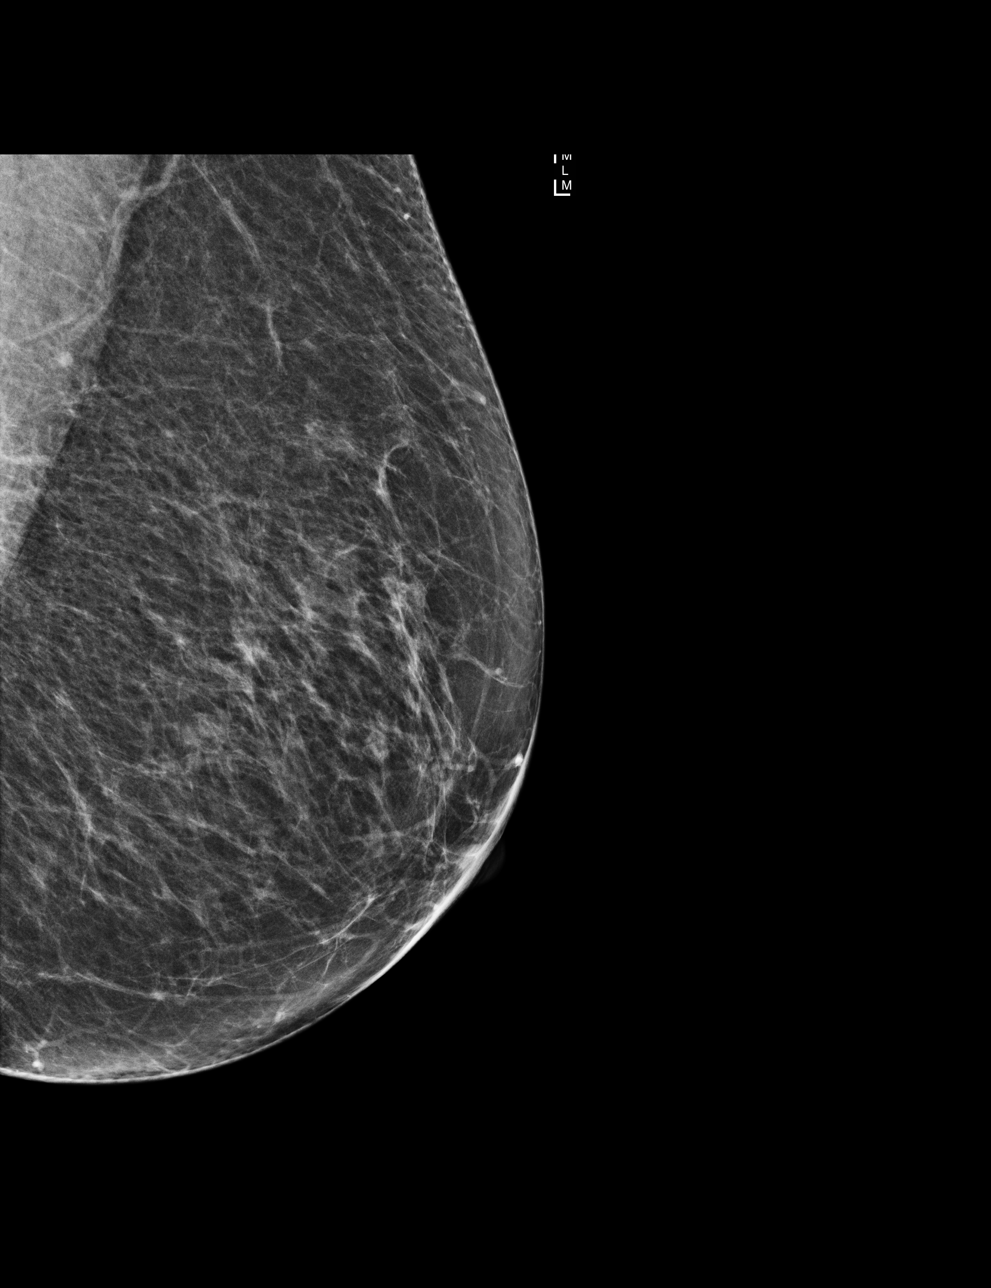

[R CC]
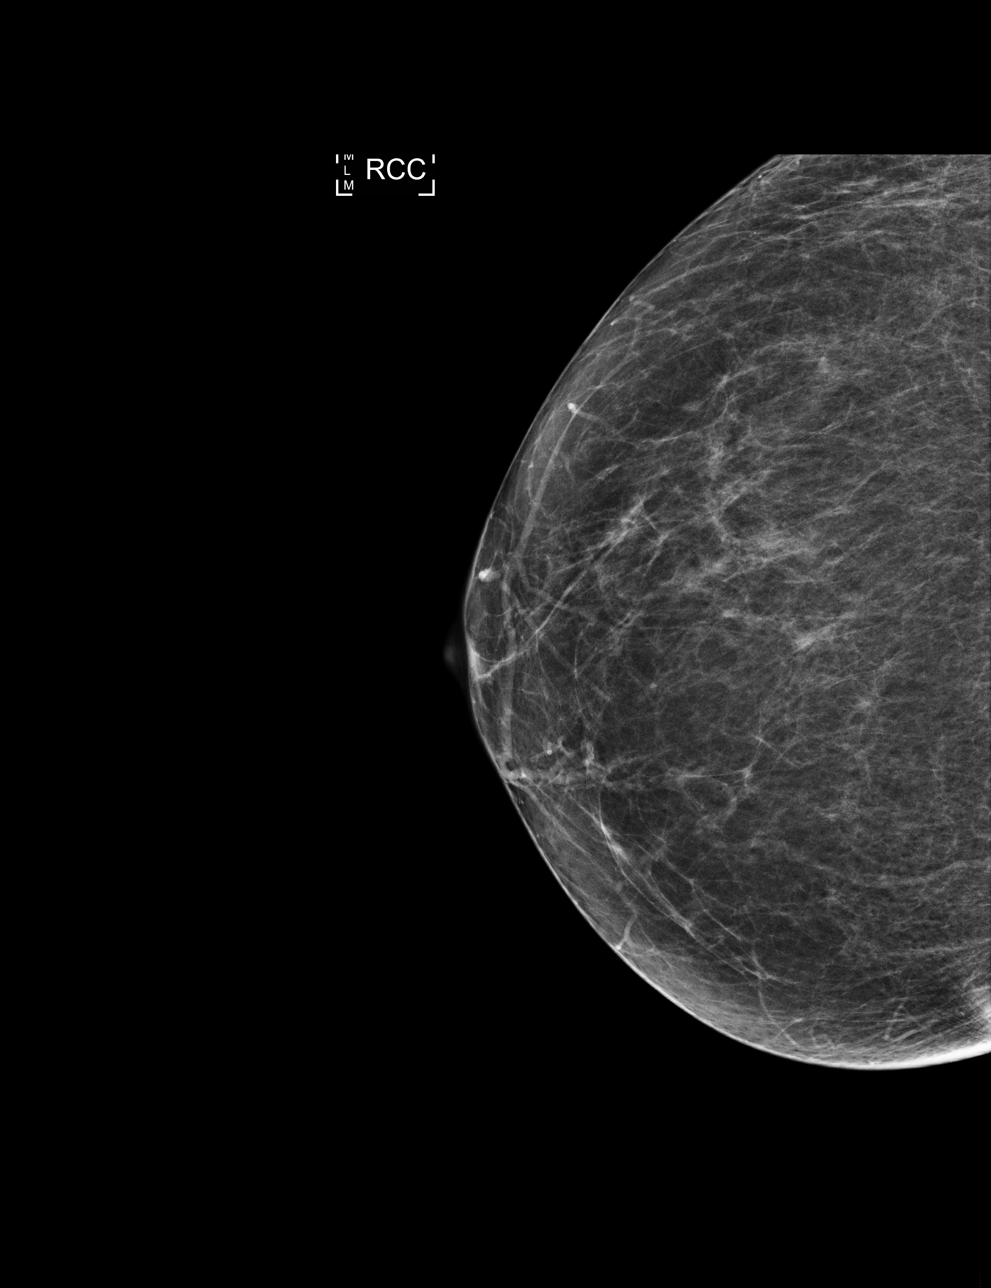

[L CC]
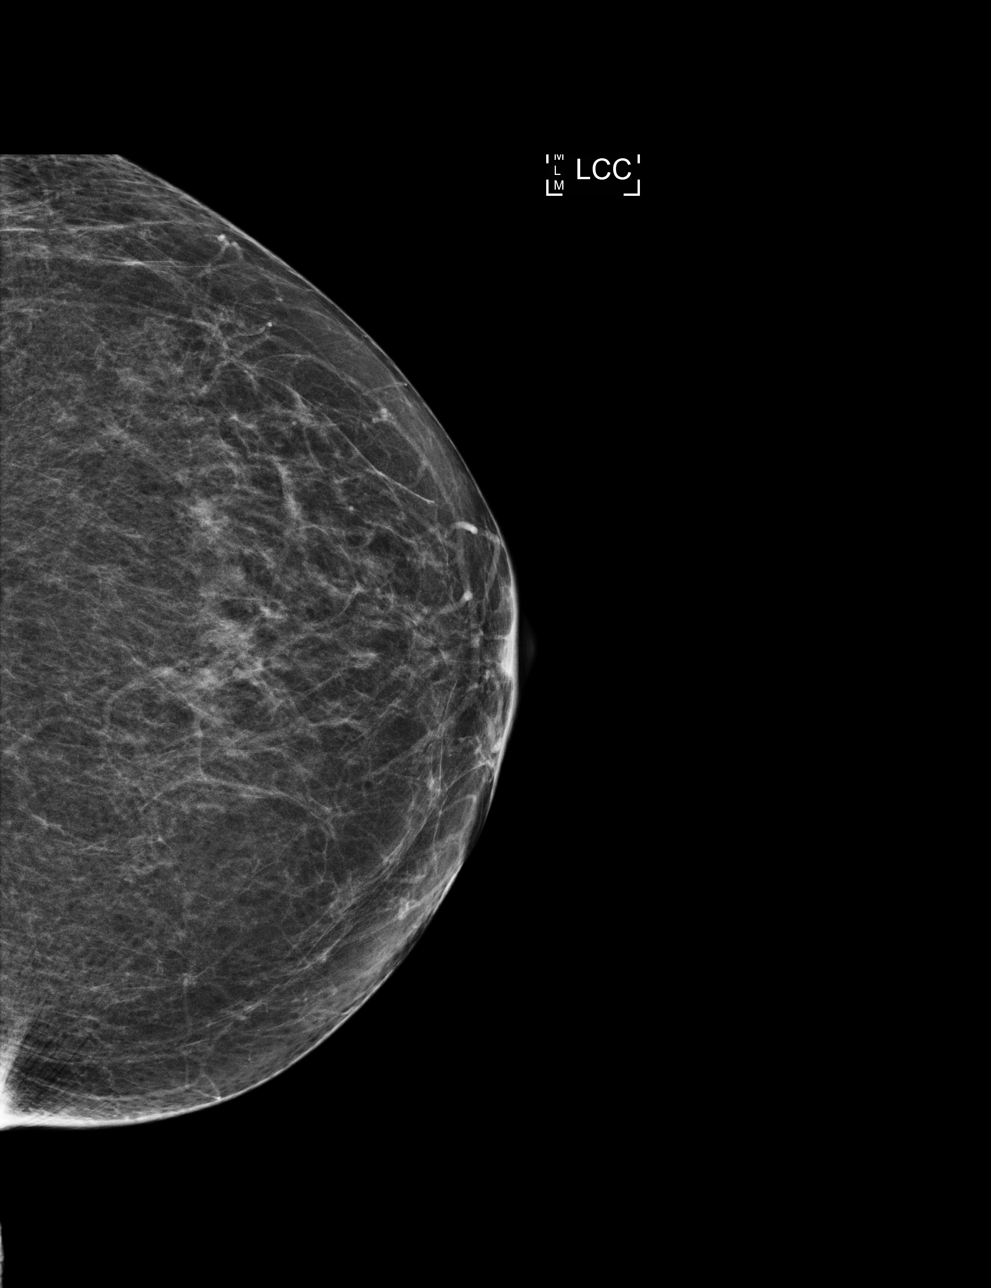

[R MLO]
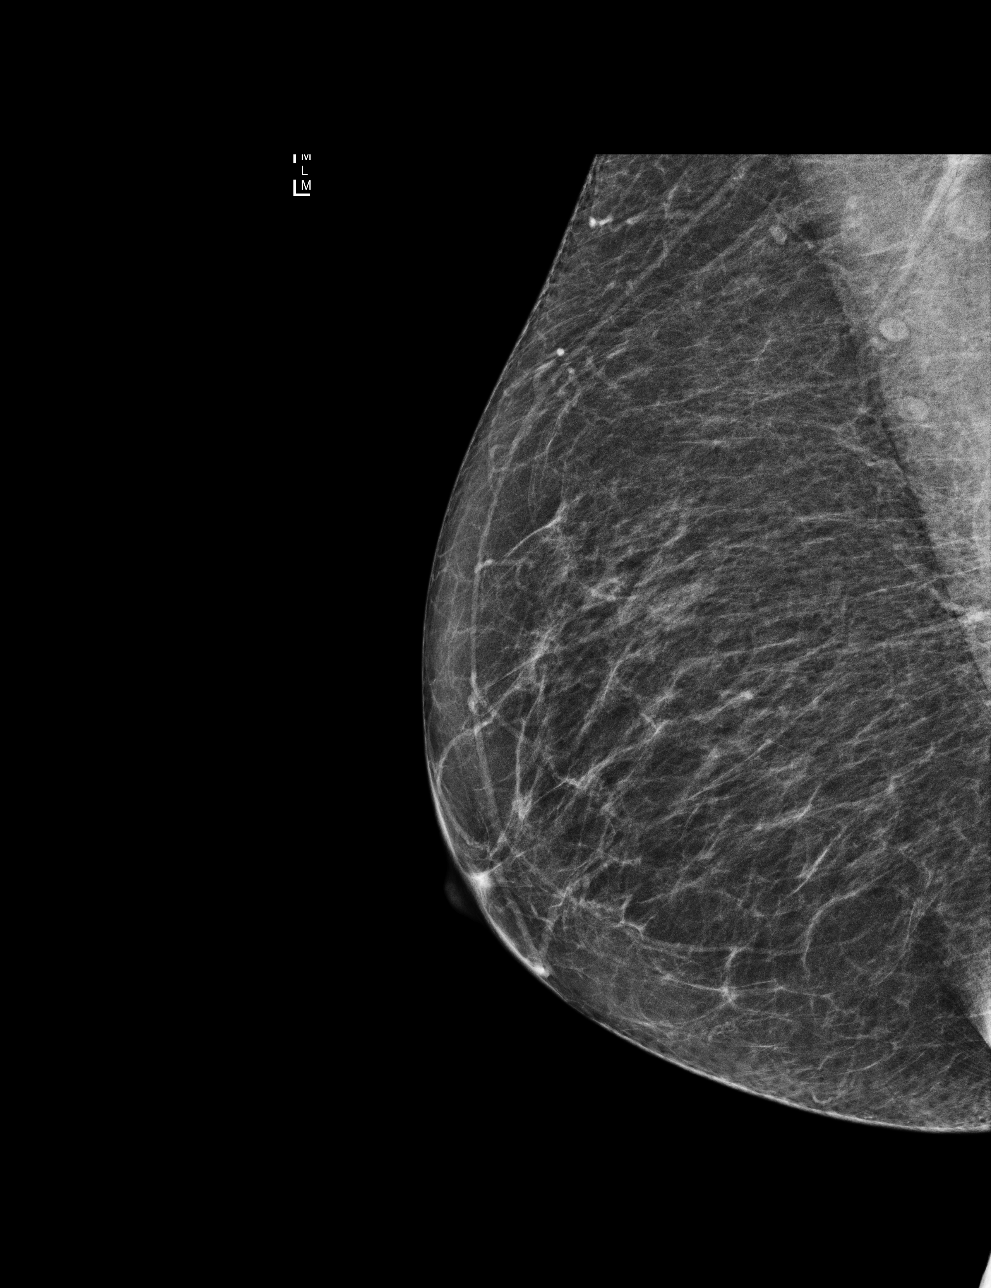

[4 of 4 positions shown; findings below may reference images not displayed]

ACR Breast Density Category b: There are scattered areas of
fibroglandular density.
FINDINGS: There are no findings suspicious for malignancy. Images were
processed with CAD.
IMPRESSION: No mammographic evidence of malignancy. A result letter of this
screening mammogram will be mailed directly to the patient.

RECOMMENDATION:
Screening mammogram in one year. (Code:AS-G-LCT)

BI-RADS CATEGORY  1: Negative.

## 2021-07-22 ENCOUNTER — Encounter: Payer: Self-pay | Admitting: Adult Health

## 2021-07-22 ENCOUNTER — Non-Acute Institutional Stay: Payer: PPO | Admitting: Adult Health

## 2021-07-22 DIAGNOSIS — F03A0 Unspecified dementia, mild, without behavioral disturbance, psychotic disturbance, mood disturbance, and anxiety: Secondary | ICD-10-CM | POA: Diagnosis not present

## 2021-07-22 DIAGNOSIS — M79671 Pain in right foot: Secondary | ICD-10-CM | POA: Diagnosis not present

## 2021-07-22 DIAGNOSIS — E039 Hypothyroidism, unspecified: Secondary | ICD-10-CM | POA: Diagnosis not present

## 2021-08-26 DIAGNOSIS — D23112 Other benign neoplasm of skin of right lower eyelid, including canthus: Secondary | ICD-10-CM | POA: Diagnosis not present

## 2021-08-26 DIAGNOSIS — Z961 Presence of intraocular lens: Secondary | ICD-10-CM | POA: Diagnosis not present

## 2021-08-26 DIAGNOSIS — H11042 Peripheral pterygium, stationary, left eye: Secondary | ICD-10-CM | POA: Diagnosis not present

## 2021-08-26 DIAGNOSIS — H35373 Puckering of macula, bilateral: Secondary | ICD-10-CM | POA: Diagnosis not present

## 2021-08-26 DIAGNOSIS — H04123 Dry eye syndrome of bilateral lacrimal glands: Secondary | ICD-10-CM | POA: Diagnosis not present

## 2021-08-29 DIAGNOSIS — H90A31 Mixed conductive and sensorineural hearing loss, unilateral, right ear with restricted hearing on the contralateral side: Secondary | ICD-10-CM | POA: Diagnosis not present

## 2021-08-29 DIAGNOSIS — H90A22 Sensorineural hearing loss, unilateral, left ear, with restricted hearing on the contralateral side: Secondary | ICD-10-CM | POA: Diagnosis not present

## 2021-09-23 ENCOUNTER — Non-Acute Institutional Stay: Payer: PPO | Admitting: Nurse Practitioner

## 2021-09-23 ENCOUNTER — Encounter: Payer: Self-pay | Admitting: Nurse Practitioner

## 2021-09-23 DIAGNOSIS — R350 Frequency of micturition: Secondary | ICD-10-CM | POA: Diagnosis not present

## 2021-09-23 DIAGNOSIS — E039 Hypothyroidism, unspecified: Secondary | ICD-10-CM | POA: Diagnosis not present

## 2021-09-23 DIAGNOSIS — M159 Polyosteoarthritis, unspecified: Secondary | ICD-10-CM | POA: Diagnosis not present

## 2021-09-23 DIAGNOSIS — K5900 Constipation, unspecified: Secondary | ICD-10-CM | POA: Diagnosis not present

## 2021-09-23 DIAGNOSIS — D638 Anemia in other chronic diseases classified elsewhere: Secondary | ICD-10-CM | POA: Diagnosis not present

## 2021-09-23 DIAGNOSIS — M150 Primary generalized (osteo)arthritis: Secondary | ICD-10-CM

## 2021-09-23 DIAGNOSIS — N1831 Chronic kidney disease, stage 3a: Secondary | ICD-10-CM | POA: Diagnosis not present

## 2021-09-23 DIAGNOSIS — E785 Hyperlipidemia, unspecified: Secondary | ICD-10-CM | POA: Diagnosis not present

## 2021-09-23 DIAGNOSIS — F03A0 Unspecified dementia, mild, without behavioral disturbance, psychotic disturbance, mood disturbance, and anxiety: Secondary | ICD-10-CM

## 2021-09-23 DIAGNOSIS — I350 Nonrheumatic aortic (valve) stenosis: Secondary | ICD-10-CM

## 2021-09-23 NOTE — Assessment & Plan Note (Signed)
LDL 110 11/22/20, taking Omega 3 

## 2021-09-23 NOTE — Assessment & Plan Note (Signed)
Hgb 12.5 09/18/21,   takes ASA '81mg'$  qd.

## 2021-09-23 NOTE — Assessment & Plan Note (Signed)
OAB, not new, up to 3-4x per night, declined urology consultation, Myrbetriq '25mg'$  qd if descries.

## 2021-09-23 NOTE — Assessment & Plan Note (Signed)
Bun/creat 20/1.0 04/18/21

## 2021-09-23 NOTE — Progress Notes (Unsigned)
Location:  [place] Room Number: AL/812/A Place of Service:  ALF (13) Provider:  Geary Rufo X, NP  [name], MD  Patient Care Team: [name], MD as PCP - General (Family Medicine) [name] Dorcas Melito X, NP as Nurse Practitioner (Nurse Practitioner) [name], MD as Consulting Physician (Ophthalmology) [name], MD as Consulting Physician (Orthopedic Surgery) [name], [name], MD as Consulting Physician (Dermatology) [name], MD (Inactive) as Consulting Physician (Gastroenterology) [name], MD as Consulting Physician (General Surgery)  Extended Emergency Contact Information Primary Emergency Contact: Vicenta Dunning, [place] [facility] [employer] Phone: [phone] Relation: Daughter  Code Status:  FULL Goals of care: Advanced Directive information    09/23/2021    9:43 AM  Advanced Directives  Does Patient Have a Medical Advance Directive? Yes  Type of Advance Directive Living will     Chief Complaint  Patient presents with   Medical Management of Chronic Issues    Patient is here for a follow up for chronic conditions, patient due for fall screening    HPI:  Pt is a 88 y.o. female seen today for managing chronic medical conditions     Chronic pain, multiple sites, Tylenol prn,  Ambulates with walker.  Hx of syncope,  aortic valve stenosis CKD Bun/creat 20/1.0 04/18/21 Hyperlipidemia, LDL 110 11/22/20, taking Omega 3 Hypothyroidism, taking Levothyroxine, TSH 0.64 05/30/21 Dementia, mild, CT head 02/15/21 mild trophy of brain,  no behavioral issues.              Anemia, Hgb 12.5 09/18/21,   takes ASA '81mg'$  qd.              Urinary frequency, OAB, not new, up to 3-4x per night, declined urology consultation, Myrbetriq '25mg'$  qd if descries.   Constipation, taking Senokot S.    Past Medical History:  Diagnosis Date   Cerebral atrophy ([place]) 01/21/2013   Hyperlipidemia     Hypothyroid    Osteoarthrosis, unspecified whether generalized or localized, unspecified site    Osteoporosis, unspecified    Other and unspecified hyperlipidemia    Other generalized ischemic cerebrovascular disease 01/21/2013   small vessel disease   Syncope and collapse    Umbilical hernia without mention of obstruction or gangrene    Unspecified hearing loss    Unspecified hypothyroidism    Unspecified vitamin D deficiency    Ventral hernia, unspecified, without mention of obstruction or gangrene    Past Surgical History:  Procedure Laterality Date   CATARACT EXTRACTION W/ INTRAOCULAR LENS  IMPLANT, BILATERAL  2012   Dr. Bing Plume   HERNIA REPAIR  01/2008   laparoscopic Dr. [name]   INGUINAL HERNIA REPAIR N/A 05/10/2019   Procedure: LAPAROSCOPIC FEMORAL HERNIA REPAIR WITH MESH;  Surgeon: Ralene Ok, MD;  Location: [facility];  Service: General;  Laterality: N/A;   KNEE SURGERY  2008   Dr. Alyssa Grove  left   TOE AMPUTATION  1986   little toe right foot  Dr. Dema Severin; [place]    Allergies  Allergen Reactions   Dust Mite Extract    Mushroom Extract Complex Other (See Comments)    "Allergic," per paperwork from facility   Penicillins Diarrhea and Other (See Comments)    "Allergic," per paperwork from facility    Outpatient Encounter Medications as of 09/23/2021  Medication Sig   acetaminophen (TYLENOL) 500 MG tablet Take 2 tablets (1,000 mg total) by  mouth every 6 (six) hours as needed.   aspirin 81 MG tablet Take 81 mg by mouth daily.   Calcium Carbonate-Vitamin D 600-200 MG-UNIT TABS Take 1 tablet by mouth 2 (two) times daily.   HYDROCORTISONE, TOPICAL, 2 % LOTN Apply topically daily.   levothyroxine (SYNTHROID) 100 MCG tablet Take 100 mcg by mouth daily.   Multiple Vitamin (MULTIVITAMIN WITH MINERALS) TABS Take 1 tablet by mouth daily.   Omega-3 Fatty Acids (FISH OIL) 1200 MG CAPS Take 1 capsule by mouth daily.   sennosides-docusate sodium (SENOKOT-S) 8.6-50 MG tablet  Take 2 tablets by mouth daily.   No facility-administered encounter medications on file as of 09/23/2021.    Review of Systems  Constitutional:  Negative for appetite change, fatigue and fever.  HENT:  Positive for hearing loss. Negative for congestion and voice change.   Respiratory:  Negative for cough.   Cardiovascular:  Positive for leg swelling.  Gastrointestinal:  Negative for abdominal pain and constipation.  Genitourinary:  Positive for frequency. Negative for dysuria and urgency.  Musculoskeletal:  Positive for arthralgias and gait problem.       Right femur pain after fall, X-ray negative for fx, better.   Skin:  Negative for color change.  Neurological:  Negative for speech difficulty, weakness and light-headedness.       Memory lapses.   Psychiatric/Behavioral:  Negative for behavioral problems and sleep disturbance. The patient is not nervous/anxious.     Immunization History  Administered Date(s) Administered   Influenza Whole 10/28/2011, 11/10/2012, 10/29/2017   Influenza, High Dose Seasonal PF 11/05/2016, 11/11/2018, 11/12/2020   Influenza-Unspecified 11/14/2013, 10/26/2014, 11/08/2015, 11/09/2019   Moderna Sars-Covid-2 Vaccination 01/31/2019, 02/28/2019, 12/06/2019, 06/26/2020   PFIZER(Purple Top)SARS-COV-2 Vaccination 10/16/2020   Pneumococcal Conjugate-13 02/05/2017   Pneumococcal Polysaccharide-23 02/08/2007   Td 01/28/1991, 06/12/2017   Tdap 06/12/2017   Zoster Recombinat (Shingrix) 07/09/2017, 09/15/2017   Zoster, Live 12/27/2012   Pertinent  Health Maintenance Due  Topic Date Due   INFLUENZA VACCINE  08/27/2021   DEXA SCAN  Completed      08/04/2019    1:10 PM 09/02/2019   12:05 PM 04/20/2020   10:57 AM 02/15/2021   12:46 PM 02/16/2021    2:41 AM  Fall Risk  Falls in the past year? 1 1 0    Was there an injury with Fall? 1 1     Fall Risk Category Calculator 2 2     Fall Risk Category Moderate Moderate     Patient Fall Risk Level    Low fall risk High  fall risk   Functional Status Survey:    Vitals:   09/23/21 0947  BP: 118/68  Pulse: 81  Resp: 18  Temp: 97.6 F (36.4 C)  SpO2: 94%  Weight: 133 lb (60.3 kg)  Height: '4\' 10"'$  (1.473 m)   Body mass index is 27.8 kg/m. Physical Exam Constitutional:      Appearance: Normal appearance.  HENT:     Head: Normocephalic and atraumatic.     Mouth/Throat:     Mouth: Mucous membranes are moist.  Eyes:     Extraocular Movements: Extraocular movements intact.     Conjunctiva/sclera: Conjunctivae normal.     Pupils: Pupils are equal, round, and reactive to light.  Cardiovascular:     Rate and Rhythm: Normal rate and regular rhythm.     Heart sounds: Murmur heard.  Pulmonary:     Effort: Pulmonary effort is normal.     Breath sounds: Normal breath sounds.  No rales.  Abdominal:     General: Bowel sounds are normal.     Palpations: Abdomen is soft.     Tenderness: There is no abdominal tenderness.  Musculoskeletal:        General: Tenderness present.     Cervical back: Normal range of motion and neck supple.     Right lower leg: Edema present.     Left lower leg: Edema present.     Comments: Trace edema BLE. Right knee pain if walks too much.   Skin:    General: Skin is warm and dry.  Neurological:     General: No focal deficit present.     Mental Status: She is alert and oriented to person, place, and time. Mental status is at baseline.     Gait: Gait abnormal.  Psychiatric:        Mood and Affect: Mood normal.        Behavior: Behavior normal.        Thought Content: Thought content normal.     Labs reviewed: Recent Labs    02/15/21 1308 02/19/21 0000 03/05/21 0000 04/18/21 0000  NA 137 140 140 142  K 3.7 4.1 4.4 4.3  CL 102 105 106 105  CO2 24 32* 26* 25*  GLUCOSE 122*  --   --   --   BUN 19 24* 19 20  CREATININE 1.07* 1.0 1.1 1.0  CALCIUM 9.1 8.7 8.9 9.3   Recent Labs    02/15/21 1308 02/19/21 0000 03/05/21 0000 04/18/21 0000  AST 74* '20 13 15  '$ ALT  '29 22 9 8  '$ ALKPHOS 84 66 76 81  BILITOT 0.7  --   --   --   PROT 8.3*  --   --   --   ALBUMIN 3.9 3.2* 3.3* 3.9   Recent Labs    02/15/21 1308 02/19/21 0000 03/05/21 0000 04/18/21 0000  WBC 10.4 7.6 5.0 5.5  NEUTROABS  --  5,464.00 3,005.00  --   HGB 13.4 10.9* 10.8* 12.5  HCT 39.3 33* 32* 37  MCV 89.9  --   --   --   PLT 408* 387 409* 330   Lab Results  Component Value Date   TSH 0.64 05/30/2021   Lab Results  Component Value Date   HGBA1C 5.9 (H) 01/31/2020   Lab Results  Component Value Date   CHOL 205 (H) 11/22/2020   HDL 65 11/22/2020   LDLCALC 110 (H) 11/22/2020   TRIG 180 (H) 11/22/2020   CHOLHDL 3.2 11/22/2020    Significant Diagnostic Results in last 30 days:  No results found.  Assessment/Plan Hypothyroidism  taking Levothyroxine, TSH 0.64 05/30/21  Mild dementia no behavioral issues.   Anemia, chronic disease Hgb 12.5 09/18/21,   takes ASA '81mg'$  qd.   Urinary frequency OAB, not new, up to 3-4x per night, declined urology consultation, Myrbetriq '25mg'$  qd if descries.   Constipation Stable, taking Senokot S.     Dyslipidemia LDL 110 11/22/20, taking Omega 3  Stage 3a chronic kidney disease ([place]) Bun/creat 20/1.0 04/18/21  Osteoarthritis, multiple sites Chronic pain, multiple sites, Tylenol prn,  Ambulates with walker.   Aortic valve stenosis Hx of syncope,  aortic valve stenosis     Family/ staff Communication: plan of care reviewed with the patient and charge nurse.   Labs/tests ordered:  none  Time spend 40 minutes.

## 2021-09-23 NOTE — Assessment & Plan Note (Signed)
taking Levothyroxine, TSH 0.64 05/30/21 

## 2021-09-23 NOTE — Assessment & Plan Note (Signed)
Stable, taking Senokot S.

## 2021-09-23 NOTE — Assessment & Plan Note (Signed)
no behavioral issues.

## 2021-09-23 NOTE — Assessment & Plan Note (Signed)
Hx of syncope,  aortic valve stenosis

## 2021-09-23 NOTE — Assessment & Plan Note (Signed)
Chronic pain, multiple sites, Tylenol prn,  Ambulates with walker.

## 2021-12-17 ENCOUNTER — Non-Acute Institutional Stay: Payer: PPO | Admitting: Family Medicine

## 2021-12-17 DIAGNOSIS — N1831 Chronic kidney disease, stage 3a: Secondary | ICD-10-CM

## 2021-12-17 DIAGNOSIS — F03A0 Unspecified dementia, mild, without behavioral disturbance, psychotic disturbance, mood disturbance, and anxiety: Secondary | ICD-10-CM | POA: Diagnosis not present

## 2021-12-17 DIAGNOSIS — I350 Nonrheumatic aortic (valve) stenosis: Secondary | ICD-10-CM | POA: Diagnosis not present

## 2021-12-17 DIAGNOSIS — R269 Unspecified abnormalities of gait and mobility: Secondary | ICD-10-CM

## 2021-12-17 DIAGNOSIS — E039 Hypothyroidism, unspecified: Secondary | ICD-10-CM | POA: Diagnosis not present

## 2021-12-17 NOTE — Progress Notes (Signed)
Provider:  [name], MD Location:      Place of Service:     PCP: [name], MD Patient Care Team: [name], MD as PCP - General (Family Medicine) [name] [name], [name], NP as Nurse Practitioner (Nurse Practitioner) [name], MD as Consulting Physician (Ophthalmology) [name], MD as Consulting Physician (Orthopedic Surgery) [name], [name], MD as Consulting Physician (Dermatology) [name], MD (Inactive) as Consulting Physician (Gastroenterology) [name], MD as Consulting Physician (General Surgery)  Extended Emergency Contact Information Primary Emergency Contact: Vicenta Dunning, [place] [facility] [employer] Phone: [phone] Relation: Daughter  Code Status:  Goals of Care: Advanced Directive information    09/23/2021    9:43 AM  Advanced Directives  Does Patient Have a Medical Advance Directive? Yes  Type of Advance Directive Living will      No chief complaint on file.   HPI: Patient is a 88 y.o. female seen today for medical management of chronic diseases including generalized ischemic cerebrovascular disease, hearing loss, hypothyroidism, I found her today in [facility] [name].  She had been needing.  She took the occasion to show me several of her sweaters that she is needed.  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  She repeated this several times. And showing me about her needing she explained that she had to remember the number of stitches per row according to the design which tells me her cognitive abilities are still fairly good. She has had some low back pain as well as right knee pain recently.  Past Medical History:  Diagnosis Date   Cerebral atrophy ([place]) 01/21/2013   Hyperlipidemia    Hypothyroid    Osteoarthrosis, unspecified whether generalized or localized, unspecified site    Osteoporosis,  unspecified    Other and unspecified hyperlipidemia    Other generalized ischemic cerebrovascular disease 01/21/2013   small vessel disease   Syncope and collapse    Umbilical hernia without mention of obstruction or gangrene    Unspecified hearing loss    Unspecified hypothyroidism    Unspecified vitamin D deficiency    Ventral hernia, unspecified, without mention of obstruction or gangrene    Past Surgical History:  Procedure Laterality Date   CATARACT EXTRACTION W/ INTRAOCULAR LENS  IMPLANT, BILATERAL  2012   Dr. Bing Plume   HERNIA REPAIR  01/2008   laparoscopic Dr. [name]   INGUINAL HERNIA REPAIR N/A 05/10/2019   Procedure: LAPAROSCOPIC FEMORAL HERNIA REPAIR WITH MESH;  Surgeon: Ralene Ok, MD;  Location: [facility];  Service: General;  Laterality: N/A;   KNEE SURGERY  2008   Dr. Alyssa Grove  left   TOE AMPUTATION  1986   little toe right foot  Dr. Dema Severin; [facility]    reports that she has never smoked. She has never used smokeless tobacco. She reports that she does not drink alcohol and does not use drugs. Social History   Socioeconomic History   Marital status: Widowed    Spouse name: Not on file   Number of children: Not on file   Years of education: Not on file   Highest education level: Not on file  Occupational History   Occupation: retired Psychologist, counselling: RETIRED  Tobacco Use   Smoking status: Never   Smokeless tobacco: Never  Vaping Use   Vaping  Use: Never used  Substance and Sexual Activity   Alcohol use: No   Drug use: No   Sexual activity: Never  Other Topics Concern   Not on file  Social History Narrative   Lives at [facility] 03/2005   Widow   Living Will   Never smoked   No alcohol    Social Determinants of Health   Financial Resource Strain: Low Risk  (01/08/2017)   Overall Financial Resource Strain (CARDIA)    Difficulty of Paying Living Expenses: Not hard at all  Food Insecurity: No Food Insecurity (01/08/2017)    Hunger Vital Sign    Worried About Running Out of Food in the Last Year: Never true    Ran Out of Food in the Last Year: Never true  Transportation Needs: No Transportation Needs (01/08/2017)   PRAPARE - Hydrologist (Medical): No    Lack of Transportation (Non-Medical): No  Physical Activity: Insufficiently Active (01/08/2017)   Exercise Vital Sign    Days of Exercise per Week: 6 days    Minutes of Exercise per Session: 20 min  Stress: No Stress Concern Present (01/08/2017)   [place]    Feeling of Stress : Not at all  Social Connections: Somewhat Isolated (01/08/2017)   Social Connection and Isolation Panel [NHANES]    Frequency of Communication with Friends and Family: Twice a week    Frequency of Social Gatherings with Friends and Family: Three times a week    Attends Religious Services: More than 4 times per year    Active Member of Clubs or Organizations: No    Attends Archivist Meetings: Never    Marital Status: Widowed  Intimate Partner Violence: Not At Risk (01/08/2017)   Humiliation, Afraid, Rape, and Kick questionnaire    Fear of Current or Ex-Partner: No    Emotionally Abused: No    Physically Abused: No    Sexually Abused: No    Functional Status Survey:    Family History  Problem Relation Age of Onset   Melanoma Sister    Cancer Sister     Health Maintenance  Topic Date Due   Medicare Annual Wellness (AWV)  01/08/2018   INFLUENZA VACCINE  08/27/2021   COVID-19 Vaccine (6 - 2023-24 season) 09/27/2021   Pneumonia Vaccine 27+ Years old  Completed   DEXA SCAN  Completed   Zoster Vaccines- Shingrix  Completed   HPV VACCINES  Aged Out    Allergies  Allergen Reactions   Dust Mite Extract    Mushroom Extract Complex Other (See Comments)    "Allergic," per paperwork from facility   Penicillins Diarrhea and Other (See Comments)    "Allergic," per  paperwork from facility    Outpatient Encounter Medications as of 12/17/2021  Medication Sig   acetaminophen (TYLENOL) 500 MG tablet Take 2 tablets (1,000 mg total) by mouth every 6 (six) hours as needed.   aspirin 81 MG tablet Take 81 mg by mouth daily.   Calcium Carbonate-Vitamin D 600-200 MG-UNIT TABS Take 1 tablet by mouth 2 (two) times daily.   HYDROCORTISONE, TOPICAL, 2 % LOTN Apply topically daily.   levothyroxine (SYNTHROID) 100 MCG tablet Take 100 mcg by mouth daily.   Multiple Vitamin (MULTIVITAMIN WITH MINERALS) TABS Take 1 tablet by mouth daily.   Omega-3 Fatty Acids (FISH OIL) 1200 MG CAPS Take 1 capsule by mouth daily.   sennosides-docusate sodium (SENOKOT-S) 8.6-50 MG  tablet Take 2 tablets by mouth daily.   No facility-administered encounter medications on file as of 12/17/2021.    Review of Systems  Constitutional: Negative.   HENT: Negative.    Respiratory: Negative.    Cardiovascular: Negative.   Genitourinary: Negative.   Musculoskeletal:  Positive for arthralgias and gait problem.  Psychiatric/Behavioral: Negative.    All other systems reviewed and are negative.   There were no vitals filed for this visit. There is no height or weight on file to calculate BMI. Physical Exam Vitals and nursing note reviewed.  Constitutional:      Appearance: Normal appearance.  HENT:     Mouth/Throat:     Mouth: Mucous membranes are moist.     Pharynx: Oropharynx is clear.  Eyes:     Extraocular Movements: Extraocular movements intact.     Pupils: Pupils are equal, round, and reactive to light.  Cardiovascular:     Rate and Rhythm: Normal rate and regular rhythm.     Heart sounds: Murmur heard.  Pulmonary:     Effort: Pulmonary effort is normal.     Breath sounds: Normal breath sounds.  Abdominal:     General: Bowel sounds are normal.     Palpations: Abdomen is soft.  Musculoskeletal:        General: Tenderness present.     Comments: Right knee is a little tender  to palpation but there is no crepitus appreciated Does use a walker to help in ambulation  Neurological:     General: No focal deficit present.     Mental Status: She is alert and oriented to person, place, and time.     Labs reviewed: Basic Metabolic Panel: Recent Labs    02/15/21 1308 02/19/21 0000 03/05/21 0000 04/18/21 0000  NA 137 140 140 142  K 3.7 4.1 4.4 4.3  CL 102 105 106 105  CO2 24 32* 26* 25*  GLUCOSE 122*  --   --   --   BUN 19 24* 19 20  CREATININE 1.07* 1.0 1.1 1.0  CALCIUM 9.1 8.7 8.9 9.3   Liver Function Tests: Recent Labs    02/15/21 1308 02/19/21 0000 03/05/21 0000 04/18/21 0000  AST 74* '20 13 15  '$ ALT '29 22 9 8  '$ ALKPHOS 84 66 76 81  BILITOT 0.7  --   --   --   PROT 8.3*  --   --   --   ALBUMIN 3.9 3.2* 3.3* 3.9   No results for input(s): "LIPASE", "AMYLASE" in the last 8760 hours. No results for input(s): "AMMONIA" in the last 8760 hours. CBC: Recent Labs    02/15/21 1308 02/19/21 0000 03/05/21 0000 04/18/21 0000  WBC 10.4 7.6 5.0 5.5  NEUTROABS  --  5,464.00 3,005.00  --   HGB 13.4 10.9* 10.8* 12.5  HCT 39.3 33* 32* 37  MCV 89.9  --   --   --   PLT 408* 387 409* 330   Cardiac Enzymes: Recent Labs    02/15/21 1308 02/15/21 1708  CKTOTAL 3,051* 2,795*   BNP: Invalid input(s): "POCBNP" Lab Results  Component Value Date   HGBA1C 5.9 (H) 01/31/2020   Lab Results  Component Value Date   TSH 0.64 05/30/2021   Lab Results  Component Value Date   [id number] 338 08/16/2019   No results found for: "FOLATE" No results found for: "IRON", "TIBC", "FERRITIN"  Imaging and Procedures obtained prior to SNF admission: CT Head Wo Contrast  Result Date: 02/15/2021 CLINICAL  DATA:  Altered mental status, found down EXAM: CT HEAD WITHOUT CONTRAST TECHNIQUE: Contiguous axial images were obtained from the base of the skull through the vertex without intravenous contrast. RADIATION DOSE REDUCTION: This exam was performed according to the  departmental dose-optimization program which includes automated exposure control, adjustment of the mA and/or kV according to patient size and/or use of iterative reconstruction technique. COMPARISON:  07/27/2019 FINDINGS: Brain: Mild atrophy. No evidence of acute infarction, hemorrhage, hydrocephalus, extra-axial collection or mass lesion/mass effect. Vascular: Atherosclerotic and physiologic intracranial calcifications. Skull: Normal. Negative for fracture or focal lesion. Sinuses/Orbits: No acute finding. Other: Bilateral TMJ DJD, right worse than left. Multiple dental restorations. IMPRESSION: Negative for bleed or other acute intracranial process. Electronically Signed   By: Lucrezia Europe M.D.   On: 02/15/2021 14:02   DG HIP UNILAT W OR W/O PELVIS 2-3 VIEWS RIGHT  Result Date: 02/15/2021 CLINICAL DATA:  Trauma, fall, pain EXAM: DG HIP (WITH OR WITHOUT PELVIS) 2-3V RIGHT COMPARISON:  07/27/2019 FINDINGS: No recent fracture or dislocation is seen. Degenerative changes are noted in both hips, more severe on the right side. There is evidence of previous ventral hernia repair. IMPRESSION: No recent fracture or dislocation is seen. Degenerative changes are noted in both hips, more severe on the right side. Electronically Signed   By: Elmer Picker M.D.   On: 02/15/2021 13:53   DG Shoulder Right  Result Date: 02/15/2021 CLINICAL DATA:  Trauma, fall, pain EXAM: RIGHT SHOULDER - 2+ VIEW COMPARISON:  None. FINDINGS: No recent fracture or dislocation is seen. Bony spurs seen in the right AC joint. IMPRESSION: No recent fracture or dislocation is seen in the right shoulder. Electronically Signed   By: Elmer Picker M.D.   On: 02/15/2021 13:52    Assessment/Plan 1. Abnormality of gait With some discomfort in her back and right leg.  Uses a gait to help with ambulation.  No recent falls  2. Aortic valve stenosis, etiology of cardiac valve disease unspecified There is a faint murmur.  Patient has no  recollection or knowledge of same  3. Hypothyroidism, unspecified type TSH in therapeutic range on levothyroxine  4. Mild dementia without behavioral disturbance, psychotic disturbance, mood disturbance, or anxiety, unspecified dementia type ([place]) There is some mild confusion CT from earlier this year shows mild atrophy  5. Stage 3a chronic kidney disease ([place]) Seen by nephrology.  Renal function stable.  Advised against use of NSAIDs    Family/ staff Communication:   Labs/tests ordered:  .smmsig

## 2022-01-15 ENCOUNTER — Telehealth: Payer: Self-pay | Admitting: *Deleted

## 2022-01-15 ENCOUNTER — Non-Acute Institutional Stay (INDEPENDENT_AMBULATORY_CARE_PROVIDER_SITE_OTHER): Payer: PPO | Admitting: Family Medicine

## 2022-01-15 ENCOUNTER — Encounter: Payer: Self-pay | Admitting: Family Medicine

## 2022-01-15 VITALS — BP 136/84 | HR 80 | Temp 96.8°F | Ht <= 58 in | Wt 143.0 lb

## 2022-01-15 DIAGNOSIS — M159 Polyosteoarthritis, unspecified: Secondary | ICD-10-CM | POA: Diagnosis not present

## 2022-01-15 DIAGNOSIS — H9193 Unspecified hearing loss, bilateral: Secondary | ICD-10-CM | POA: Diagnosis not present

## 2022-01-15 DIAGNOSIS — H9190 Unspecified hearing loss, unspecified ear: Secondary | ICD-10-CM | POA: Insufficient documentation

## 2022-01-15 NOTE — Progress Notes (Signed)
Provider:  [name], MD  Careteam: Patient Care Team: [name], MD as PCP - General (Family Medicine) [name] [name], [name], NP as Nurse Practitioner (Nurse Practitioner) [name], MD as Consulting Physician (Ophthalmology) [name], MD as Consulting Physician (Orthopedic Surgery) [name], [name], MD as Consulting Physician (Dermatology) [name], MD (Inactive) as Consulting Physician (Gastroenterology) [name], MD as Consulting Physician (General Surgery)  PLACE OF SERVICE:  [place]  Advanced Directive information    Allergies  Allergen Reactions   Dust Mite Extract    Mushroom Extract Complex Other (See Comments)    "Allergic," per paperwork from facility   Penicillins Diarrhea and Other (See Comments)    "Allergic," per paperwork from facility    Chief Complaint  Patient presents with   Acute Visit    Requesting referral to [facility] to replace Hearing Aid she stepped on.      HPI: Patient is a 88 y.o. female this nice lady [facility]  I had visited her recently in her apartment and she seems to be doing well but recently her hearing aid fell out of her right ear and she stepped on it and needs a new hearing aid and she come today to see if we can help her get an appointment with the company that provided her hearing aid.  She has tried but not had success.  She does have a hearing aid in place in the left ear but I think that does not work as well as the right ear She also is complaining of some occasional pain in her right knee.  Does not take anything for the pain.  She ambulates with a walker and has not had any recent falls.  Review of Systems:  Review of Systems  HENT:  Positive for hearing loss.   All other systems reviewed and are negative.   Past Medical History:  Diagnosis Date   Cerebral atrophy ([place]) 01/21/2013   Hyperlipidemia    Hypothyroid    Osteoarthrosis,  unspecified whether generalized or localized, unspecified site    Osteoporosis, unspecified    Other and unspecified hyperlipidemia    Other generalized ischemic cerebrovascular disease 01/21/2013   small vessel disease   Syncope and collapse    Umbilical hernia without mention of obstruction or gangrene    Unspecified hearing loss    Unspecified hypothyroidism    Unspecified vitamin D deficiency    Ventral hernia, unspecified, without mention of obstruction or gangrene    Past Surgical History:  Procedure Laterality Date   CATARACT EXTRACTION W/ INTRAOCULAR LENS  IMPLANT, BILATERAL  2012   Dr. Bing Plume   HERNIA REPAIR  01/2008   laparoscopic Dr. [name]   INGUINAL HERNIA REPAIR N/A 05/10/2019   Procedure: LAPAROSCOPIC FEMORAL HERNIA REPAIR WITH MESH;  Surgeon: Ralene Ok, MD;  Location: [facility];  Service: General;  Laterality: N/A;   KNEE SURGERY  2008   Dr. Alyssa Grove  left   TOE AMPUTATION  1986   little toe right foot  Dr. Dema Severin; [facility]   Social History:   reports that she has never smoked. She has never used smokeless tobacco. She reports that she does not drink alcohol and does not use drugs.  Family History  Problem Relation Age of Onset   Melanoma Sister    Cancer Sister     Medications: Patient's Medications  New Prescriptions   No medications on file  Previous  Medications   ACETAMINOPHEN (TYLENOL) 500 MG TABLET    Take 2 tablets (1,000 mg total) by mouth every 6 (six) hours as needed.   ASPIRIN 81 MG TABLET    Take 81 mg by mouth daily.   CALCIUM CARBONATE-VITAMIN D 600-200 MG-UNIT TABS    Take 1 tablet by mouth 2 (two) times daily.   HYDROCORTISONE, TOPICAL, 2 % LOTN    Apply topically daily.   LEVOTHYROXINE (SYNTHROID) 100 MCG TABLET    Take 100 mcg by mouth daily.   MULTIPLE VITAMIN (MULTIVITAMIN WITH MINERALS) TABS    Take 1 tablet by mouth daily.   OMEGA-3 FATTY ACIDS (FISH OIL) 1200 MG CAPS    Take 1 capsule by mouth daily.   SENNOSIDES-DOCUSATE  SODIUM (SENOKOT-S) 8.6-50 MG TABLET    Take 2 tablets by mouth daily.  Modified Medications   No medications on file  Discontinued Medications   No medications on file    Physical Exam:  Vitals:   01/15/22 1301  BP: 136/84  Pulse: 80  Temp: (!) 96.8 F (36 C)  SpO2: 97%  Weight: 143 lb (64.9 kg)  Height: '4\' 10"'$  (1.473 m)   Body mass index is 29.89 kg/m. Wt Readings from Last 3 Encounters:  01/15/22 143 lb (64.9 kg)  09/23/21 133 lb (60.3 kg)  07/22/21 129 lb 9.6 oz (58.8 kg)    Physical Exam Vitals and nursing note reviewed.  Constitutional:      Appearance: Normal appearance.  HENT:     Right Ear: Tympanic membrane normal.     Ears:     Comments: Left hearing aid in place.  Right external canal is free of cerumen. Musculoskeletal:        General: Normal range of motion.     Comments: There is some tenderness around the right knee at the joint lines.  Crepitance is also present.  I suspect she has some degenerative osteoarthritis.  I encouraged her to continue to keep walking and moving and using her walker and use Tylenol arthritis strength as needed for pain  Neurological:     Mental Status: She is alert.     Labs reviewed: Basic Metabolic Panel: Recent Labs    02/15/21 1308 02/19/21 0000 03/05/21 0000 04/18/21 0000 05/30/21 0000  NA 137 140 140 142  --   K 3.7 4.1 4.4 4.3  --   CL 102 105 106 105  --   CO2 24 32* 26* 25*  --   GLUCOSE 122*  --   --   --   --   BUN 19 24* 19 20  --   CREATININE 1.07* 1.0 1.1 1.0  --   CALCIUM 9.1 8.7 8.9 9.3  --   TSH  --   --   --  10.38* 0.64   Liver Function Tests: Recent Labs    02/15/21 1308 02/19/21 0000 03/05/21 0000 04/18/21 0000  AST 74* '20 13 15  '$ ALT '29 22 9 8  '$ ALKPHOS 84 66 76 81  BILITOT 0.7  --   --   --   PROT 8.3*  --   --   --   ALBUMIN 3.9 3.2* 3.3* 3.9   No results for input(s): "LIPASE", "AMYLASE" in the last 8760 hours. No results for input(s): "AMMONIA" in the last 8760  hours. CBC: Recent Labs    02/15/21 1308 02/19/21 0000 03/05/21 0000 04/18/21 0000  WBC 10.4 7.6 5.0 5.5  NEUTROABS  --  5,464.00 3,005.00  --  HGB 13.4 10.9* 10.8* 12.5  HCT 39.3 33* 32* 37  MCV 89.9  --   --   --   PLT 408* 387 409* 330   Lipid Panel: No results for input(s): "CHOL", "HDL", "LDLCALC", "TRIG", "CHOLHDL", "LDLDIRECT" in the last 8760 hours. TSH: Recent Labs    04/18/21 0000 05/30/21 0000  TSH 10.38* 0.64   A1C: Lab Results  Component Value Date   HGBA1C 5.9 (H) 01/31/2020     Assessment/Plan  1. Primary osteoarthritis involving multiple joints Suspect patient's knee pain is related to the degenerative changes.  Just encourage walker and Tylenol extra strength as needed  2. Bilateral hearing loss, unspecified hearing loss type Referral to audiology.  Will also try to call today to see if we can help arrange an appointment for her to get a new hearing aid    [name], MD [place] [phone]

## 2022-01-15 NOTE — Telephone Encounter (Signed)
Patient came into Clinic to be seen today at [facility] due to [place] and requesting an appointment with [facility] to get replaced.   Cecelia Byars [phone] and spoke with Margretta Ditty and she stated that this incident occurred in August and the Process was started to get patient free hearing aides through the [place] [employer] [place]. Patient can receive free ones from the state every 6 years.   [facility] stated that they have seen and tested patient on 12/11/2021 and submitted paperwork to Dept of Services of Death and Hard of Hearing [phone].    I called Dept of Services of Death and Hard of Hearing and spoke with Ria Comment and she stated that they have received the application but they are waiting for the additional Information from the Son of the Patient, Hassell Done. They cannot proceed without the paperwork from family. Stated that it is up to the patient and Family for the next steps of getting the Hearing Aides Processed.

## 2022-01-16 NOTE — Telephone Encounter (Signed)
Dr. Sabra Heck gave message to Rockey Situ, RN at [facility] to discuss with patient.

## 2022-01-22 ENCOUNTER — Telehealth: Payer: Self-pay

## 2022-01-22 NOTE — Telephone Encounter (Signed)
Levada Dy from [facility] hearing dept of Hard of hearing is waiting to hear back from Haigler( Patient son) for additional information to proceed.

## 2022-01-22 NOTE — Telephone Encounter (Signed)
Information also given to Helene Kelp, Nurse with [facility].

## 2022-02-10 ENCOUNTER — Non-Acute Institutional Stay: Payer: PPO | Admitting: Nurse Practitioner

## 2022-02-10 ENCOUNTER — Encounter: Payer: Self-pay | Admitting: Nurse Practitioner

## 2022-02-10 DIAGNOSIS — M545 Low back pain, unspecified: Secondary | ICD-10-CM | POA: Diagnosis not present

## 2022-02-10 DIAGNOSIS — M25851 Other specified joint disorders, right hip: Secondary | ICD-10-CM | POA: Diagnosis not present

## 2022-02-10 DIAGNOSIS — M533 Sacrococcygeal disorders, not elsewhere classified: Secondary | ICD-10-CM | POA: Diagnosis not present

## 2022-02-10 DIAGNOSIS — M25559 Pain in unspecified hip: Secondary | ICD-10-CM | POA: Diagnosis not present

## 2022-02-10 DIAGNOSIS — M47816 Spondylosis without myelopathy or radiculopathy, lumbar region: Secondary | ICD-10-CM | POA: Diagnosis not present

## 2022-02-10 DIAGNOSIS — M47814 Spondylosis without myelopathy or radiculopathy, thoracic region: Secondary | ICD-10-CM | POA: Diagnosis not present

## 2022-02-10 DIAGNOSIS — G8929 Other chronic pain: Secondary | ICD-10-CM

## 2022-02-10 DIAGNOSIS — M159 Polyosteoarthritis, unspecified: Secondary | ICD-10-CM | POA: Diagnosis not present

## 2022-02-10 DIAGNOSIS — E871 Hypo-osmolality and hyponatremia: Secondary | ICD-10-CM | POA: Diagnosis not present

## 2022-02-10 DIAGNOSIS — N1831 Chronic kidney disease, stage 3a: Secondary | ICD-10-CM | POA: Diagnosis not present

## 2022-02-10 DIAGNOSIS — M25852 Other specified joint disorders, left hip: Secondary | ICD-10-CM | POA: Diagnosis not present

## 2022-02-10 LAB — COMPREHENSIVE METABOLIC PANEL
Albumin: 3.9 (ref 3.5–5.0)
Calcium: 9.1 (ref 8.7–10.7)
Globulin: 3.1
eGFR: 54

## 2022-02-10 LAB — BASIC METABOLIC PANEL
BUN: 18 (ref 4–21)
CO2: 23 — AB (ref 13–22)
Chloride: 101 (ref 99–108)
Creatinine: 1 (ref 0.5–1.1)
Glucose: 123
Potassium: 4.7 mEq/L (ref 3.5–5.1)
Sodium: 133 — AB (ref 137–147)

## 2022-02-10 LAB — CBC AND DIFFERENTIAL
HCT: 36 (ref 36–46)
Hemoglobin: 11.7 — AB (ref 12.0–16.0)
Neutrophils Absolute: 6586
Platelets: 375 10*3/uL (ref 150–400)
WBC: 8.7

## 2022-02-10 LAB — CBC: RBC: 3.91 (ref 3.87–5.11)

## 2022-02-10 LAB — HEPATIC FUNCTION PANEL
ALT: 13 U/L (ref 7–35)
AST: 21 (ref 13–35)
Alkaline Phosphatase: 70 (ref 25–125)
Bilirubin, Total: 0.4

## 2022-02-10 NOTE — Assessment & Plan Note (Addendum)
acute lower back pain, spinal spinous process tenderness palpated, not radiating to legs, failed Tylenol and Tramadol. Denied injury, dysuria, or abd pain. Obtain X-ray thoracic spine, lumbar spine, sacrum, pelvis, hips. Schedule Norco 5/'325mg'$  1/2 tab bid x 5 days. Therapy to eval and tx.  02/10/22 Na 133, K 4.7, Bun 18, creat 1.00, wbc 8.7, Hgb 11.7, plt 375, neutrophils 75.5  02/20/21 X-ray T spine, T11 compression fx uncertain age, L spine: L4 compression uncertain age, Sacrum/pelvis/hips no fx.      Chronic pain, multiple sites, Tylenol prn,  Ambulates with walker.

## 2022-02-10 NOTE — Assessment & Plan Note (Signed)
Bun/creat 20/1.0 04/18/21, update CBC/diff, CMP/eGFR

## 2022-02-10 NOTE — Assessment & Plan Note (Signed)
taking Levothyroxine, TSH 0.64 05/30/21

## 2022-02-10 NOTE — Assessment & Plan Note (Signed)
02/10/22 Na 133, K 4.7, Bun 18, creat 1.00, observe.

## 2022-02-10 NOTE — Progress Notes (Unsigned)
Location:  [facility]ing Home Room Number: [id number] Place of Service:  SNF (31) Provider: Lennie Odor Tradarius Reinwald NP  [name], MD  Patient Care Team: [name], MD as PCP - General (Family Medicine) [name] Kari Montero X, NP as Nurse Practitioner (Nurse Practitioner) [name], MD as Consulting Physician (Ophthalmology) [name], MD as Consulting Physician (Orthopedic Surgery) [name], [name], MD as Consulting Physician (Dermatology) [name], MD (Inactive) as Consulting Physician (Gastroenterology) [name], MD as Consulting Physician (General Surgery)  Extended Emergency Contact Information Primary Emergency Contact: Vicenta Dunning, [place] of [employer] Phone: [phone] Relation: Daughter  Code Status: DNR Goals of care: Advanced Directive information    09/23/2021    9:43 AM  Advanced Directives  Does Patient Have a Medical Advance Directive? Yes  Type of Advance Directive Living will     Chief Complaint  Patient presents with   Acute Visit    Lower back pain    HPI:  Pt is a 89 y.o. female seen today for an acute visit for acute lower back pain, spinal spinous process tenderness palpated, not radiating to legs, failed Tylenol and Tramadol. Denied injury, dysuria, or abd pain.      Chronic pain, multiple sites, Tylenol prn,  Ambulates with walker.  Hx of syncope,  aortic valve stenosis CKD Bun/creat 20/1.0 04/18/21 Hyperlipidemia, LDL 110 11/22/20, taking Omega 3 Hypothyroidism, taking Levothyroxine, TSH 0.64 05/30/21 Dementia, mild, CT head 02/15/21 mild trophy of brain,  no behavioral issues.              Anemia, Hgb 12.5 09/18/21,   takes ASA '81mg'$  qd.              Urinary frequency, OAB, not new, up to 3-4x per night, declined urology consultation, Myrbetriq '25mg'$  qd if descries.              Constipation, taking Senokot S.   Past Medical History:  Diagnosis Date   Cerebral atrophy ([place])  01/21/2013   Hyperlipidemia    Hypothyroid    Osteoarthrosis, unspecified whether generalized or localized, unspecified site    Osteoporosis, unspecified    Other and unspecified hyperlipidemia    Other generalized ischemic cerebrovascular disease 01/21/2013   small vessel disease   Syncope and collapse    Umbilical hernia without mention of obstruction or gangrene    Unspecified hearing loss    Unspecified hypothyroidism    Unspecified vitamin D deficiency    Ventral hernia, unspecified, without mention of obstruction or gangrene    Past Surgical History:  Procedure Laterality Date   CATARACT EXTRACTION W/ INTRAOCULAR LENS  IMPLANT, BILATERAL  2012   Dr. Bing Plume   HERNIA REPAIR  01/2008   laparoscopic Dr. [name]   INGUINAL HERNIA REPAIR N/A 05/10/2019   Procedure: LAPAROSCOPIC FEMORAL HERNIA REPAIR WITH MESH;  Surgeon: Ralene Ok, MD;  Location: [place];  Service: General;  Laterality: N/A;   KNEE SURGERY  2008   Dr. Alyssa Grove  left   TOE AMPUTATION  1986   little toe right foot  Dr. Dema Severin; [place]    Allergies  Allergen Reactions   Dust Mite Extract    Mushroom Extract Complex Other (See Comments)    "Allergic," per paperwork from facility   Penicillins Diarrhea and Other (See Comments)    "Allergic," per paperwork from facility    Allergies as of 02/10/2022  Reactions   Dust Mite Extract    Mushroom Extract Complex Other (See Comments)   "Allergic," per paperwork from facility   Penicillins Diarrhea, Other (See Comments)   "Allergic," per paperwork from facility        Medication List        Accurate as of February 10, 2022 11:59 PM. If you have any questions, ask your nurse or doctor.          STOP taking these medications    HYDROCORTISONE (TOPICAL) 2 % Lotn Stopped by: Alba Perillo X Charman Blasco, NP       TAKE these medications    acetaminophen 500 MG tablet Commonly known as: TYLENOL Take 2 tablets (1,000 mg total) by mouth every 6 (six) hours  as needed.   Aspercreme Lidocaine 4 % Generic drug: lidocaine Place 1 patch onto the skin daily. Apply to SOLE OF RIGHT FOOT   aspirin 81 MG tablet Take 81 mg by mouth daily.   Calcium Carbonate-Vitamin D 600-200 MG-UNIT Tabs Take 1 tablet by mouth 2 (two) times daily.   Fish Oil 1200 MG Caps Take 1 capsule by mouth daily.   HYDROcodone-acetaminophen 5-325 MG tablet Commonly known as: NORCO/VICODIN Take 1 tablet by mouth in the morning and at bedtime.   levothyroxine 100 MCG tablet Commonly known as: SYNTHROID Take 100 mcg by mouth daily.   multivitamin with minerals Tabs tablet Take 1 tablet by mouth daily.   Procto-Med HC 2.5 % rectal cream Generic drug: hydrocortisone Place 1 Application rectally as needed for hemorrhoids or anal itching.   sennosides-docusate sodium 8.6-50 MG tablet Commonly known as: SENOKOT-S Take 2 tablets by mouth daily.   traMADol 50 MG tablet Commonly known as: ULTRAM Take 50 mg by mouth every 6 (six) hours as needed.        Review of Systems  Constitutional:  Negative for appetite change, fatigue and fever.  HENT:  Positive for hearing loss. Negative for congestion and voice change.   Respiratory:  Negative for cough.   Cardiovascular:  Negative for leg swelling.  Gastrointestinal:  Negative for abdominal pain and constipation.  Genitourinary:  Positive for frequency. Negative for dysuria and urgency.  Musculoskeletal:  Positive for arthralgias, back pain and gait problem.       Lower back pain, spinal spinous process tenderness palpated, pain worsens with movement.   Skin:  Negative for color change.  Neurological:  Negative for speech difficulty, weakness and light-headedness.       Memory lapses.   Psychiatric/Behavioral:  Negative for behavioral problems and sleep disturbance. The patient is not nervous/anxious.     Immunization History  Administered Date(s) Administered   Influenza Whole 10/28/2011, 11/10/2012, 10/29/2017    Influenza, High Dose Seasonal PF 11/05/2016, 11/11/2018, 11/12/2020   Influenza-Unspecified 11/14/2013, 10/26/2014, 11/08/2015, 11/09/2019, 11/18/2021   Moderna Sars-Covid-2 Vaccination 01/31/2019, 02/28/2019, 12/06/2019, 06/26/2020   PFIZER(Purple Top)SARS-COV-2 Vaccination 10/16/2020   PPD Test 02/27/2021   Pfizer Covid-19 Vaccine Bivalent Booster 65yr & up 11/28/2021   Pneumococcal Conjugate-13 02/05/2017   Pneumococcal Polysaccharide-23 02/08/2007   Td 01/28/1991, 06/12/2017   Tdap 06/12/2017   Zoster Recombinat (Shingrix) 07/09/2017, 09/15/2017   Zoster, Live 12/27/2012   Pertinent  Health Maintenance Due  Topic Date Due   INFLUENZA VACCINE  Completed   DEXA SCAN  Completed      08/04/2019    1:10 PM 09/02/2019   12:05 PM 04/20/2020   10:57 AM 02/15/2021   12:46 PM 02/16/2021    2:41 AM  Fall  Risk  Falls in the past year? 1 1 0    Was there an injury with Fall? 1 1     Fall Risk Category Calculator 2 2     Fall Risk Category (Retired) Moderate Moderate     (RETIRED) Patient Fall Risk Level    Low fall risk High fall risk   Functional Status Survey:    Vitals:   02/10/22 1621  BP: 136/77  Pulse: 67  Resp: 18  Temp: 97.8 F (36.6 C)  SpO2: 95%  Weight: 142 lb (64.4 kg)  Height: '4\' 10"'$  (1.473 m)   Body mass index is 29.68 kg/m. Physical Exam Constitutional:      Appearance: Normal appearance.  HENT:     Head: Normocephalic and atraumatic.     Mouth/Throat:     Mouth: Mucous membranes are moist.  Eyes:     Extraocular Movements: Extraocular movements intact.     Conjunctiva/sclera: Conjunctivae normal.     Pupils: Pupils are equal, round, and reactive to light.  Cardiovascular:     Rate and Rhythm: Normal rate and regular rhythm.     Heart sounds: Murmur heard.  Pulmonary:     Effort: Pulmonary effort is normal.     Breath sounds: Normal breath sounds. No rales.  Abdominal:     General: Bowel sounds are normal.     Palpations: Abdomen is soft.      Tenderness: There is no abdominal tenderness.  Musculoskeletal:        General: Tenderness present.     Cervical back: Normal range of motion and neck supple.     Right lower leg: No edema.     Left lower leg: No edema.     Comments: Lower back pain, positional and with movement spinal spinous process ?L1, L2, tenderness palpated.   Skin:    General: Skin is warm and dry.  Neurological:     General: No focal deficit present.     Mental Status: She is alert and oriented to person, place, and time. Mental status is at baseline.     Gait: Gait abnormal.  Psychiatric:        Mood and Affect: Mood normal.        Behavior: Behavior normal.        Thought Content: Thought content normal.     Labs reviewed: Recent Labs    02/15/21 1308 02/19/21 0000 03/05/21 0000 04/18/21 0000 02/10/22 0000  NA 137   < > 140 142 133*  K 3.7   < > 4.4 4.3 4.7  CL 102   < > 106 105 101  CO2 24   < > 26* 25* 23*  GLUCOSE 122*  --   --   --   --   BUN 19   < > '19 20 18  '$ CREATININE 1.07*   < > 1.1 1.0 1.0  CALCIUM 9.1   < > 8.9 9.3 9.1   < > = values in this interval not displayed.   Recent Labs    02/15/21 1308 02/19/21 0000 03/05/21 0000 04/18/21 0000 02/10/22 0000  AST 74*   < > '13 15 21  '$ ALT 29   < > '9 8 13  '$ ALKPHOS 84   < > 76 81 70  BILITOT 0.7  --   --   --   --   PROT 8.3*  --   --   --   --   ALBUMIN 3.9   < >  3.3* 3.9 3.9   < > = values in this interval not displayed.   Recent Labs    02/15/21 1308 02/15/21 1308 02/19/21 0000 03/05/21 0000 04/18/21 0000 02/10/22 0000  WBC 10.4   < > 7.6 5.0 5.5 8.7  NEUTROABS  --   --  5,464.00 3,005.00  --  6,586.00  HGB 13.4  --  10.9* 10.8* 12.5 11.7*  HCT 39.3  --  33* 32* 37 36  MCV 89.9  --   --   --   --   --   PLT 408*  --  387 409* 330 375   < > = values in this interval not displayed.   Lab Results  Component Value Date   TSH 0.64 05/30/2021   Lab Results  Component Value Date   HGBA1C 5.9 (H) 01/31/2020   Lab  Results  Component Value Date   CHOL 205 (H) 11/22/2020   HDL 65 11/22/2020   LDLCALC 110 (H) 11/22/2020   TRIG 180 (H) 11/22/2020   CHOLHDL 3.2 11/22/2020    Significant Diagnostic Results in last 30 days:  No results found.  Assessment/Plan: Low back pain acute lower back pain, spinal spinous process tenderness palpated, not radiating to legs, failed Tylenol and Tramadol. Denied injury, dysuria, or abd pain. Obtain X-ray thoracic spine, lumbar spine, sacrum, pelvis, hips. Schedule Norco 5/'325mg'$  1/2 tab bid x 5 days. Therapy to eval and tx.  02/10/22 Na 133, K 4.7, Bun 18, creat 1.00, wbc 8.7, Hgb 11.7, plt 375, neutrophils 75.5  02/20/21 X-ray T spine, T11 compression fx uncertain age, L spine: L4 compression uncertain age, Sacrum/pelvis/hips no fx.      Chronic pain, multiple sites, Tylenol prn,  Ambulates with walker.   Stage 3a chronic kidney disease ([place]) Bun/creat 20/1.0 04/18/21, update CBC/diff, CMP/eGFR  Hypothyroidism  taking Levothyroxine, TSH 0.64 05/30/21  Hyponatremia 02/10/22 Na 133, K 4.7, Bun 18, creat 1.00, observe.     Family/ staff Communication: plan of care reviewed with the patient and charge nurse.   Labs/tests ordered:  X-ray thoracic spine, lumbar spine, sacrum, pelvis, hips  Time spend 40 minutes.

## 2022-02-11 ENCOUNTER — Encounter: Payer: Self-pay | Admitting: Nurse Practitioner

## 2022-02-13 DIAGNOSIS — M6281 Muscle weakness (generalized): Secondary | ICD-10-CM | POA: Diagnosis not present

## 2022-02-13 DIAGNOSIS — M5459 Other low back pain: Secondary | ICD-10-CM | POA: Diagnosis not present

## 2022-02-13 DIAGNOSIS — R2689 Other abnormalities of gait and mobility: Secondary | ICD-10-CM | POA: Diagnosis not present

## 2022-02-17 ENCOUNTER — Encounter: Payer: Self-pay | Admitting: Nurse Practitioner

## 2022-02-17 ENCOUNTER — Non-Acute Institutional Stay: Payer: PPO | Admitting: Nurse Practitioner

## 2022-02-17 DIAGNOSIS — R350 Frequency of micturition: Secondary | ICD-10-CM

## 2022-02-17 DIAGNOSIS — F03A0 Unspecified dementia, mild, without behavioral disturbance, psychotic disturbance, mood disturbance, and anxiety: Secondary | ICD-10-CM

## 2022-02-17 DIAGNOSIS — E782 Mixed hyperlipidemia: Secondary | ICD-10-CM | POA: Diagnosis not present

## 2022-02-17 DIAGNOSIS — M159 Polyosteoarthritis, unspecified: Secondary | ICD-10-CM | POA: Diagnosis not present

## 2022-02-17 DIAGNOSIS — N1831 Chronic kidney disease, stage 3a: Secondary | ICD-10-CM

## 2022-02-17 DIAGNOSIS — M150 Primary generalized (osteo)arthritis: Secondary | ICD-10-CM

## 2022-02-17 DIAGNOSIS — K5900 Constipation, unspecified: Secondary | ICD-10-CM | POA: Diagnosis not present

## 2022-02-17 DIAGNOSIS — E039 Hypothyroidism, unspecified: Secondary | ICD-10-CM

## 2022-02-17 NOTE — Assessment & Plan Note (Signed)
Bun/creat 18/1.0 02/10/22

## 2022-02-17 NOTE — Assessment & Plan Note (Signed)
stable, on Mirlax, Senokot S

## 2022-02-17 NOTE — Assessment & Plan Note (Addendum)
lower back pain, spinal spinous process tenderness palpated, not radiating to legs, failed Tylenol and Tramadol, Norco is more effective, will increase to 5/'325mg'$  1/2 tid/bid,  02/10/22 T11, L4 compression fx uncertain age.

## 2022-02-17 NOTE — Assessment & Plan Note (Signed)
Hgb 11.7 02/10/22,  takes ASA '81mg'$  qd.

## 2022-02-17 NOTE — Assessment & Plan Note (Signed)
LDL 110 11/22/20, taking Omega 3

## 2022-02-17 NOTE — Assessment & Plan Note (Signed)
taking Levothyroxine, TSH 0.64 05/30/21

## 2022-02-17 NOTE — Progress Notes (Signed)
Location:   [place] Room Number: [place] of Service:  [id number]) Provider:  Marlana Latus, NP  [name], MD  Patient Care Team: [name], MD as PCP - General (Family Medicine) [name] Vaughan Garfinkle X, NP as Nurse Practitioner (Nurse Practitioner) [name], MD as Consulting Physician (Ophthalmology) [name], MD as Consulting Physician (Orthopedic Surgery) [name], [name], MD as Consulting Physician (Dermatology) [name], MD (Inactive) as Consulting Physician (Gastroenterology) [name], MD as Consulting Physician (General Surgery)  Extended Emergency Contact Information Primary Emergency Contact: Vicenta Dunning, [place] [employer] Phone: [phone] Relation: Daughter  Code Status:  FULL CODE Goals of care: Advanced Directive information    02/18/2022   10:35 AM  Advanced Directives  Does Patient Have a Medical Advance Directive? Yes  Type of Paramedic of [place];Living will;Out of facility DNR (pink MOST or yellow form)  Copy of [place] in Chart? Yes - validated most recent copy scanned in chart (See row information)  Pre-existing out of facility DNR order (yellow form or pink MOST form) Pink MOST form placed in chart (order not valid for inpatient use)     Chief Complaint  Patient presents with   Medical Management of Chronic Issues    Routine follow up    Immunizations    COVID vaccine due   Quality Metric Gaps    Medicare annual wellness visit    HPI:  Pt is a 89 y.o. female seen today for medical management of chronic diseases.     lower back pain, spinal spinous process tenderness palpated, not radiating to legs, failed Tylenol and Tramadol, Norco is more effective, 02/10/22 T11, L4 compression fx uncertain age.  Constipation, stable, on Mirlax, Senokot S              Chronic pain, multiple sites, Tylenol  prn,  Ambulates with walker.  Hx of syncope,  aortic valve stenosis CKD Bun/creat 18/1.0 02/10/22 Hyperlipidemia, LDL 110 11/22/20, taking Omega 3 Hypothyroidism, taking Levothyroxine, TSH 0.64 05/30/21 Dementia, mild, CT head 02/15/21 mild trophy of brain,  no behavioral issues.              Anemia, Hgb 11.7 02/10/22,  takes ASA '81mg'$  qd.              Urinary frequency, OAB, not new, up to 3-4x per night, declined urology consultation, Myrbetriq '25mg'$  qd if descries.               Past Medical History:  Diagnosis Date   Cerebral atrophy ([place]) 01/21/2013   Hyperlipidemia    Hypothyroid    Osteoarthrosis, unspecified whether generalized or localized, unspecified site    Osteoporosis, unspecified    Other and unspecified hyperlipidemia    Other generalized ischemic cerebrovascular disease 01/21/2013   small vessel disease   Syncope and collapse    Umbilical hernia without mention of obstruction or gangrene    Unspecified hearing loss    Unspecified hypothyroidism    Unspecified vitamin D deficiency    Ventral hernia, unspecified, without mention of obstruction or gangrene    Past Surgical History:  Procedure Laterality Date   CATARACT EXTRACTION W/ INTRAOCULAR LENS  IMPLANT, BILATERAL  2012   Dr. Bing Plume   HERNIA REPAIR  01/2008   laparoscopic Dr. [name]   INGUINAL HERNIA REPAIR N/A 05/10/2019   Procedure: LAPAROSCOPIC FEMORAL  HERNIA REPAIR WITH MESH;  Surgeon: Ralene Ok, MD;  Location: [place];  Service: General;  Laterality: N/A;   KNEE SURGERY  2008   Dr. Alyssa Grove  left   TOE AMPUTATION  1986   little toe right foot  Dr. Dema Severin; [place]    Allergies  Allergen Reactions   Dust Mite Extract    Mushroom Extract Complex Other (See Comments)    "Allergic," per paperwork from facility   Penicillins Diarrhea and Other (See Comments)    "Allergic," per paperwork from facility    Allergies as of 02/17/2022       Reactions   Dust Mite Extract    Mushroom Extract Complex  Other (See Comments)   "Allergic," per paperwork from facility   Penicillins Diarrhea, Other (See Comments)   "Allergic," per paperwork from facility        Medication List        Accurate as of February 17, 2022 11:59 PM. If you have any questions, ask your nurse or doctor.          acetaminophen 500 MG tablet Commonly known as: TYLENOL Take 2 tablets (1,000 mg total) by mouth every 6 (six) hours as needed.   Aspercreme Lidocaine 4 % Generic drug: lidocaine Place 1 patch onto the skin daily. Apply to SOLE OF RIGHT FOOT   aspirin 81 MG tablet Take 81 mg by mouth daily.   Calcium Carbonate-Vitamin D 600-200 MG-UNIT Tabs Take 1 tablet by mouth 2 (two) times daily.   Fish Oil 1200 MG Caps Take 1 capsule by mouth daily.   HYDROcodone-acetaminophen 5-325 MG tablet Commonly known as: NORCO/VICODIN Take 0.5 tablets by mouth in the morning and at bedtime.   levothyroxine 100 MCG tablet Commonly known as: SYNTHROID Take 100 mcg by mouth daily.   multivitamin with minerals Tabs tablet Take 1 tablet by mouth daily.   polyethylene glycol 17 g packet Commonly known as: MIRALAX / GLYCOLAX Take 17 g by mouth daily.   Procto-Med HC 2.5 % rectal cream Generic drug: hydrocortisone Place 1 Application rectally as needed for hemorrhoids or anal itching.   sennosides-docusate sodium 8.6-50 MG tablet Commonly known as: SENOKOT-S Take 2 tablets by mouth daily.   traMADol 50 MG tablet Commonly known as: ULTRAM Take 50 mg by mouth every 6 (six) hours as needed.        Review of Systems  Constitutional:  Negative for appetite change, fatigue and fever.  HENT:  Positive for hearing loss. Negative for congestion and voice change.   Respiratory:  Negative for cough.   Cardiovascular:  Negative for leg swelling.  Gastrointestinal:  Negative for abdominal pain and constipation.  Genitourinary:  Positive for frequency. Negative for dysuria and urgency.  Musculoskeletal:   Positive for arthralgias, back pain and gait problem.       Lower back pain, spinal spinous process tenderness palpated, pain worsens with movement.   Skin:  Negative for color change.  Neurological:  Negative for speech difficulty, weakness and light-headedness.       Memory lapses.   Psychiatric/Behavioral:  Negative for behavioral problems and sleep disturbance. The patient is not nervous/anxious.     Immunization History  Administered Date(s) Administered   Influenza Whole 10/28/2011, 11/10/2012, 10/29/2017   Influenza, High Dose Seasonal PF 11/05/2016, 11/11/2018, 11/12/2020   Influenza-Unspecified 11/14/2013, 10/26/2014, 11/08/2015, 11/09/2019, 11/18/2021   Moderna Sars-Covid-2 Vaccination 01/31/2019, 02/28/2019, 12/06/2019, 06/26/2020   PFIZER(Purple Top)SARS-COV-2 Vaccination 10/16/2020   PPD Test 02/27/2021   Pfizer Covid-19  Vaccine Bivalent Booster 57yr & up 11/28/2021   Pneumococcal Conjugate-13 02/05/2017   Pneumococcal Polysaccharide-23 02/08/2007   Td 01/28/1991, 06/12/2017   Tdap 06/12/2017   Zoster Recombinat (Shingrix) 07/09/2017, 09/15/2017   Zoster, Live 12/27/2012   Pertinent  Health Maintenance Due  Topic Date Due   INFLUENZA VACCINE  Completed   DEXA SCAN  Completed      09/02/2019   12:05 PM 04/20/2020   10:57 AM 02/15/2021   12:46 PM 02/16/2021    2:41 AM 02/18/2022   10:36 AM  Fall Risk  Falls in the past year? 1 0   1  Was there an injury with Fall? 1    1  Fall Risk Category Calculator 2    3  Fall Risk Category (Retired) Moderate      (RETIRED) Patient Fall Risk Level   Low fall risk High fall risk   Patient at Risk for Falls Due to     History of fall(s);Impaired balance/gait;Impaired mobility  Fall risk Follow up     Education provided   Functional Status Survey:    Vitals:   02/17/22 1434  BP: (!) 140/75  Pulse: 70  Resp: 18  Temp: 98.1 F (36.7 C)  Weight: 142 lb (64.4 kg)  Height: '4\' 10"'$  (1.473 m)   Body mass index is 29.68  kg/m. Physical Exam Constitutional:      Appearance: Normal appearance.  HENT:     Head: Normocephalic and atraumatic.     Mouth/Throat:     Mouth: Mucous membranes are moist.  Eyes:     Extraocular Movements: Extraocular movements intact.     Conjunctiva/sclera: Conjunctivae normal.     Pupils: Pupils are equal, round, and reactive to light.  Cardiovascular:     Rate and Rhythm: Normal rate and regular rhythm.     Heart sounds: Murmur heard.  Pulmonary:     Effort: Pulmonary effort is normal.     Breath sounds: Normal breath sounds. No rales.  Abdominal:     General: Bowel sounds are normal.     Palpations: Abdomen is soft.     Tenderness: There is no abdominal tenderness.  Musculoskeletal:        General: Tenderness present.     Cervical back: Normal range of motion and neck supple.     Right lower leg: No edema.     Left lower leg: No edema.     Comments: Lower back pain, positional and with movement spinal spinous process ?L1, L2, tenderness palpated.   Skin:    General: Skin is warm and dry.  Neurological:     General: No focal deficit present.     Mental Status: She is alert and oriented to person, place, and time. Mental status is at baseline.     Gait: Gait abnormal.  Psychiatric:        Mood and Affect: Mood normal.        Behavior: Behavior normal.        Thought Content: Thought content normal.     Labs reviewed: Recent Labs    03/05/21 0000 04/18/21 0000 02/10/22 0000  NA 140 142 133*  K 4.4 4.3 4.7  CL 106 105 101  CO2 26* 25* 23*  BUN '19 20 18  '$ CREATININE 1.1 1.0 1.0  CALCIUM 8.9 9.3 9.1   Recent Labs    03/05/21 0000 04/18/21 0000 02/10/22 0000  AST '13 15 21  '$ ALT '9 8 13  '$ ALKPHOS 76 81 70  ALBUMIN 3.3* 3.9 3.9   Recent Labs    02/19/21 0000 03/05/21 0000 04/18/21 0000 02/10/22 0000  WBC 7.6 5.0 5.5 8.7  NEUTROABS 5,464.00 3,005.00  --  6,586.00  HGB 10.9* 10.8* 12.5 11.7*  HCT 33* 32* 37 36  PLT 387 409* 330 375   Lab Results   Component Value Date   TSH 0.64 05/30/2021   Lab Results  Component Value Date   HGBA1C 5.9 (H) 01/31/2020   Lab Results  Component Value Date   CHOL 205 (H) 11/22/2020   HDL 65 11/22/2020   LDLCALC 110 (H) 11/22/2020   TRIG 180 (H) 11/22/2020   CHOLHDL 3.2 11/22/2020    Significant Diagnostic Results in last 30 days:  No results found.  Assessment/Plan Constipation stable, on Mirlax, Senokot S  Osteoarthritis, multiple sites lower back pain, spinal spinous process tenderness palpated, not radiating to legs, failed Tylenol and Tramadol, Norco is more effective, will increase to 5/'325mg'$  1/2 tid/bid,  02/10/22 T11, L4 compression fx uncertain age.   Stage 3a chronic kidney disease ([place]) Bun/creat 18/1.0 02/10/22  Mixed hyperlipidemia LDL 110 11/22/20, taking Omega 3  Hypothyroidism  taking Levothyroxine, TSH 0.64 05/30/21  Mild dementia mild, CT head 02/15/21 mild trophy of brain,  no behavioral issues. update MMSE  Urinary frequency Hgb 11.7 02/10/22,  takes ASA '81mg'$  qd.      Family/ staff Communication: plan of care reviewed with the patient and charge nurse.   Labs/tests ordered:  none  Time spend 40 minutes.

## 2022-02-17 NOTE — Assessment & Plan Note (Signed)
mild, CT head 02/15/21 mild trophy of brain,  no behavioral issues. update MMSE

## 2022-02-18 ENCOUNTER — Non-Acute Institutional Stay: Payer: PPO | Admitting: Nurse Practitioner

## 2022-02-18 ENCOUNTER — Encounter: Payer: Self-pay | Admitting: Nurse Practitioner

## 2022-02-18 DIAGNOSIS — Z0000 Encounter for general adult medical examination without abnormal findings: Secondary | ICD-10-CM

## 2022-02-18 DIAGNOSIS — F03A0 Unspecified dementia, mild, without behavioral disturbance, psychotic disturbance, mood disturbance, and anxiety: Secondary | ICD-10-CM | POA: Diagnosis not present

## 2022-02-18 NOTE — Progress Notes (Addendum)
Subjective:   Krista Bowman is a 89 y.o. female who presents for Medicare Annual (Subsequent) preventive examination @ [facility].  Cardiac Risk Factors include: advanced age (>34mn, >>2women);dyslipidemia     Objective:    Today's Vitals   02/18/22 1034 02/18/22 1305  BP: (!) 140/75   Pulse: 70   Temp: 98.1 F (36.7 C)   Weight: 142 lb (64.4 kg)   Height: '4\' 10"'$  (1.473 m)   PainSc:  5    Body mass index is 29.68 kg/m.     02/18/2022   10:35 AM 02/17/2022    2:38 PM 09/23/2021    9:43 AM 07/22/2021   10:51 AM 04/19/2021    2:42 PM 04/02/2021    3:41 PM 03/04/2021   11:02 AM  Advanced Directives  Does Patient Have a Medical Advance Directive? Yes Yes Yes Yes Yes Yes Yes  Type of AParamed[place]of [place] will;Out of facility DNR (pink MOST or yellow form) [place] will;Out of facility DNR (pink MOST or yellow form) Living will Healthcare Power of Attorney Living will;Out of facility DNR (pink MOST or yellow form);Healthcare Power of [place]op[place]Living will [place] S[place]Living will  Does patient want to make changes to medical advance directive?  No - Patient declined  No - Patient declined No - Patient declined No - Patient declined No - Patient declined  Copy of [place]in Chart? Yes - validated most recent copy scanned in chart (See row information) Yes - validated most recent copy scanned in chart (See row information)  Yes - validated most recent copy scanned in chart (See row information) No - copy requested Yes - validated most recent copy scanned in chart (See row information) Yes - validated most recent copy scanned in chart (See row information)  Pre-existing out of facility DNR order (yellow form or pink MOST form) Pink MOST form placed in chart (order not valid for inpatient use) Pink MOST form placed in chart (order not valid for inpatient  use)  Pink MOST form placed in chart (order not valid for inpatient use) Pink MOST form placed in chart (order not valid for inpatient use) Pink MOST form placed in chart (order not valid for inpatient use) Pink MOST form placed in chart (order not valid for inpatient use)    Current Medications (verified) Outpatient Encounter Medications as of 02/18/2022  Medication Sig   acetaminophen (TYLENOL) 500 MG tablet Take 2 tablets (1,000 mg total) by mouth every 6 (six) hours as needed.   aspirin 81 MG tablet Take 81 mg by mouth daily.   Calcium Carbonate-Vitamin D 600-200 MG-UNIT TABS Take 1 tablet by mouth 2 (two) times daily.   HYDROcodone-acetaminophen (NORCO/VICODIN) 5-325 MG tablet Take 0.5 tablets by mouth in the morning and at bedtime.   hydrocortisone (PROCTO-MED HC) 2.5 % rectal cream Place 1 Application rectally as needed for hemorrhoids or anal itching.   levothyroxine (SYNTHROID) 100 MCG tablet Take 100 mcg by mouth daily.   lidocaine (ASPERCREME LIDOCAINE) 4 % Place 1 patch onto the skin daily. Apply to SOLE OF RIGHT FOOT   Multiple Vitamin (MULTIVITAMIN WITH MINERALS) TABS Take 1 tablet by mouth daily.   Omega-3 Fatty Acids (FISH OIL) 1200 MG CAPS Take 1 capsule by mouth daily.   polyethylene glycol (MIRALAX / GLYCOLAX) 17 g packet Take 17 g by mouth daily.   sennosides-docusate sodium (SENOKOT-S) 8.6-50 MG tablet Take 2  tablets by mouth daily.   traMADol (ULTRAM) 50 MG tablet Take 50 mg by mouth every 6 (six) hours as needed.   No facility-administered encounter medications on file as of 02/18/2022.    Allergies (verified) Dust mite extract, Mushroom extract complex, and Penicillins   History: Past Medical History:  Diagnosis Date   Cerebral atrophy ([place]) 01/21/2013   Hyperlipidemia    Hypothyroid    Osteoarthrosis, unspecified whether generalized or localized, unspecified site    Osteoporosis, unspecified    Other and unspecified hyperlipidemia    Other generalized ischemic  cerebrovascular disease 01/21/2013   small vessel disease   Syncope and collapse    Umbilical hernia without mention of obstruction or gangrene    Unspecified hearing loss    Unspecified hypothyroidism    Unspecified vitamin D deficiency    Ventral hernia, unspecified, without mention of obstruction or gangrene    Past Surgical History:  Procedure Laterality Date   CATARACT EXTRACTION W/ INTRAOCULAR LENS  IMPLANT, BILATERAL  2012   Dr. Bing Plume   HERNIA REPAIR  01/2008   laparoscopic Dr. [name]   INGUINAL HERNIA REPAIR N/A 05/10/2019   Procedure: LAPAROSCOPIC FEMORAL HERNIA REPAIR WITH MESH;  Surgeon: Ralene Ok, MD;  Location: [facility];  Service: General;  Laterality: N/A;   KNEE SURGERY  2008   Dr. Alyssa Grove  left   TOE AMPUTATION  1986   little toe right foot  Dr. Dema Severin; [facility]   Family History  Problem Relation Age of Onset   Melanoma Sister    Cancer Sister    Social History   Socioeconomic History   Marital status: Widowed    Spouse name: Not on file   Number of children: Not on file   Years of education: Not on file   Highest education level: Not on file  Occupational History   Occupation: retired Psychologist, counselling: RETIRED  Tobacco Use   Smoking status: Never   Smokeless tobacco: Never  Vaping Use   Vaping Use: Never used  Substance and Sexual Activity   Alcohol use: No   Drug use: No   Sexual activity: Never  Other Topics Concern   Not on file  Social History Narrative   Lives at [facility] 03/2005   Widow   Living Will   Never smoked   No alcohol    Social Determinants of Health   Financial Resource Strain: Low Risk  (01/08/2017)   Overall Financial Resource Strain (CARDIA)    Difficulty of Paying Living Expenses: Not hard at all  Food Insecurity: No Food Insecurity (01/08/2017)   Hunger Vital Sign    Worried About Running Out of Food in the Last Year: Never true    [place] in the Last Year: Never true   Transportation Needs: No Transportation Needs (01/08/2017)   PRAPARE - Hydrologist (Medical): No    Lack of Transportation (Non-Medical): No  Physical Activity: Insufficiently Active (01/08/2017)   Exercise Vital Sign    Days of Exercise per Week: 6 days    Minutes of Exercise per Session: 20 min  Stress: No Stress Concern Present (01/08/2017)   [place]    Feeling of Stress : Not at all  Social Connections: Somewhat Isolated (01/08/2017)   Social Connection and Isolation Panel [NHANES]    Frequency of Communication with Friends and Family: Twice a week  Frequency of Social Gatherings with Friends and Family: Three times a week    Attends Religious Services: More than 4 times per year    Active Member of Clubs or Organizations: No    Attends Archivist Meetings: Never    Marital Status: Widowed    Tobacco Counseling Counseling given: Not Answered   Clinical Intake:  Pre-visit preparation completed: Yes  Pain : 0-10 Pain Score: 5  Pain Type: Chronic pain, Neuropathic pain Pain Location: Back Pain Orientation: Mid Pain Descriptors / Indicators: Aching, Constant, Discomfort, Dull, Nagging, Tender Pain Onset: 1 to 4 weeks ago Pain Frequency: Intermittent Pain Relieving Factors: rest, positioning, [place] Effect of Pain on Daily Activities: less walking, needs more assistance with ADLs  Pain Relieving Factors: rest, positioning, Nor[place]  BMI - recorded: 29.68 Nutritional Status: BMI 25 -29 Overweight Nutritional Risks: None Diabetes: No  How often do you need to have someone help you when you read instructions, pamphlets, or other written materials from your doctor or pharmacy?: 4 - Often What is the last grade level you completed in school?: college  Diabetic?no  Interpreter Needed?: No  Information entered by :: Paublo Warshawsky Bretta Bang NP   Activities of Daily  Living    02/18/2022    1:12 PM  In your present state of health, do you have any difficulty performing the following activities:  Hearing? 1  Comment hearing aids  Vision? 0  Difficulty concentrating or making decisions? 1  Walking or climbing stairs? 1  Dressing or bathing? 1  Doing errands, shopping? 1  Preparing Food and eating ? N  Using the Toilet? N  In the past six months, have you accidently leaked urine? Y  Do you have problems with loss of bowel control? N  Managing your Medications? Y  Managing your Finances? Y    Patient Care Team: [name], MD as PCP - General (Family Medicine) [name], [facility] Desere Gwin X, NP as Nurse Practitioner (Nurse Practitioner) [name], MD as Consulting Physician (Ophthalmology) [name], MD as Consulting Physician (Orthopedic Surgery) [name], [name], MD as Consulting Physician (Dermatology) [name], MD (Inactive) as Consulting Physician (Gastroenterology) [name], MD as Consulting Physician (General Surgery)  Indicate any recent Medical Services you may have received from other than [facility] providers in the past year (date may be approximate).     Assessment:   This is a routine wellness examination for [facility].  Hearing/Vision screen No results found.  Dietary issues and exercise activities discussed: Current Exercise Habits: The patient does not participate in regular exercise at present, Exercise limited by: neurologic condition(s);orthopedic condition(s)   Goals Addressed             This Visit's Progress    Maintain Mobility and Function       Evidence-based guidance:  Emphasize the importance of physical activity and aerobic exercise as included in treatment plan; assess barriers to adherence; consider patient's abilities and preferences.  Encourage gradual increase in activity or exercise instead of stopping if pain occurs.  Reinforce individual therapy exercise prescription, such as  strengthening, stabilization and stretching programs.  Promote optimal body mechanics to stabilize the spine with lifting and functional activity.  Encourage activity and mobility modifications to facilitate optimal function, such as using a log roll for bed mobility or dressing from a seated position.  Reinforce individual adaptive equipment recommendations to limit excessive spinal movements, such as a Systems analyst.  Assess adequacy of sleep; encourage use of sleep  hygiene techniques, such as bedtime routine; use of white noise; dark, cool bedroom; avoiding daytime naps, heavy meals or exercise before bedtime.  Promote positions and modification to optimize sleep and sexual activity; consider pillows or positioning devices to assist in maintaining neutral spine.  Explore options for applying ergonomic principles at work and home, such as frequent position changes, using ergonomically designed equipment and working at optimal height.  Promote modifications to increase comfort with driving such as lumbar support, optimizing seat and steering wheel position, using cruise control and taking frequent rest stops to stretch and walk.   Notes:        Depression Screen    05/21/2018    9:52 AM 01/08/2017   11:36 AM 10/28/2016    8:32 AM 04/12/2015    2:57 PM 06/08/2014    3:23 PM 06/23/2013    3:02 PM  PHQ 2/9 Scores  PHQ - 2 Score 0 0 0 0 0 0  PHQ- 9 Score   0       Fall Risk    02/18/2022   10:36 AM 04/20/2020   10:57 AM 09/02/2019   12:05 PM 08/04/2019    1:10 PM 04/15/2019   11:38 AM  Fall Risk   Falls in the past year? 1 0 '1 1 1  '$ Number falls in past yr: 1 0 0 0 0  Injury with Fall? '1  1 1 '$ 0  Risk for fall due to : History of fall(s);Impaired balance/gait;Impaired mobility      Follow up Education provided        FALL RISK PREVENTION PERTAINING TO THE HOME:  Any stairs in or around the home? Yes  If so, are there any without handrails? No  Home free of loose throw rugs in  walkways, pet beds, electrical cords, etc? Yes  Adequate lighting in your home to reduce risk of falls? Yes   ASSISTIVE DEVICES UTILIZED TO PREVENT FALLS:  Life alert? No  Use of a cane, walker or w/c? Yes  Grab bars in the bathroom? Yes  Shower chair or bench in shower? Yes  Elevated toilet seat or a handicapped toilet? Yes   TIMED UP AND GO:  Was the test performed? Yes .  Length of time to ambulate 10 feet: 20 sec.   Gait unsteady with use of assistive device, provider informed and education provided.   Cognitive Function:    02/18/2022    2:30 PM 09/19/2020    1:35 PM 02/03/2017    8:29 AM 01/08/2017   11:42 AM 04/12/2015    3:14 PM  MMSE - Mini Mental State Exam  Orientation to time '3 5 5 5 5  '$ Orientation to Place '3 5 5 5 5  '$ Registration '3 3 3 3 3  '$ Attention/ Calculation '5 5 5 5 5  '$ Recall '1 3 3 3 3  '$ Language- name 2 objects '2 2 2 2 2  '$ Language- repeat '1 1 1 1 1  '$ Language- follow 3 step command '3 3 3 3 3  '$ Language- read & follow direction '1 1 1 1 1  '$ Write a sentence '1 1 1 1 1  '$ Copy design '1 1 1 1 1  '$ Total score '24 30 30 30 30        '$ Immunizations Immunization History  Administered Date(s) Administered   Influenza Whole 10/28/2011, 11/10/2012, 10/29/2017   Influenza, High Dose Seasonal PF 11/05/2016, 11/11/2018, 11/12/2020   Influenza-Unspecified 11/14/2013, 10/26/2014, 11/08/2015, 11/09/2019, 11/18/2021   Moderna Sars-Covid-2 Vaccination 01/31/2019, 02/28/2019,  12/06/2019, 06/26/2020   PFIZER(Purple Top)SARS-COV-2 Vaccination 10/16/2020   PPD Test 02/27/2021   Pfizer Covid-19 Vaccine Bivalent Booster 50yr & up 11/28/2021   Pneumococcal Conjugate-13 02/05/2017   Pneumococcal Polysaccharide-23 02/08/2007   Td 01/28/1991, 06/12/2017   Tdap 06/12/2017   Zoster Recombinat (Shingrix) 07/09/2017, 09/15/2017   Zoster, Live 12/27/2012    TDAP status: Up to date  Flu Vaccine status: Up to date  Pneumococcal vaccine status: Up to date  Covid-19 vaccine status:  Information provided on how to obtain vaccines.   Qualifies for Shingles Vaccine? Yes   Zostavax completed Yes   Shingrix Completed?: Yes  Screening Tests Health Maintenance  Topic Date Due   COVID-19 Vaccine (7 - 2023-24 season) 04/28/2022 (Originally 01/23/2022)   Medicare Annual Wellness (AWV)  02/19/2023   DTaP/Tdap/Td (4 - Td or Tdap) 06/13/2027   Pneumonia Vaccine 89 Years old  Completed   INFLUENZA VACCINE  Completed   DEXA SCAN  Completed   Zoster Vaccines- Shingrix  Completed   HPV VACCINES  Aged Out    Health Maintenance  There are no preventive care reminders to display for this patient.  Colorectal cancer screening: No longer required.   Mammogram status: No longer required due to aged out.  Bone Density status: Ordered 02/18/22. Pt provided with contact info and advised to call to schedule appt.  Lung Cancer Screening: (Low Dose CT Chest recommended if Age 87-80years, 30 pack-year currently smoking OR have quit w/in 15years.) does not qualify.    Additional Screening:  Hepatitis C Screening: does not qualify;   Vision Screening: Recommended annual ophthalmology exams for early detection of glaucoma and other disorders of the eye. Is the patient up to date with their annual eye exam?  No  Who is the provider or what is the name of the office in which the patient attends annual eye exams? [facility] will provide If pt is not established with a provider, would they like to be referred to a provider to establish care? No .   Dental Screening: Recommended annual dental exams for proper oral hygiene  Community Resource Referral / Chronic Care Management: CRR required this visit?  No   CCM required this visit?  No      Plan:    Pending MMSE Due Eye exam, DEXA I have personally reviewed and noted the following in the patient's chart:   Medical and social history Use of alcohol, tobacco or illicit drugs  Current medications and supplements including opioid  prescriptions. Patient is currently taking opioid prescriptions. Information provided to patient regarding non-opioid alternatives. Patient advised to discuss non-opioid treatment plan with their provider. Functional ability and status Nutritional status Physical activity Advanced directives List of other physicians Hospitalizations, surgeries, and ER visits in previous 12 months Vitals Screenings to include cognitive, depression, and falls Referrals and appointments  In addition, I have reviewed and discussed with patient certain preventive protocols, quality metrics, and best practice recommendations. A written personalized care plan for preventive services as well as general preventive health recommendations were provided to patient.     Trinaty Bundrick X Tupac Jeffus, NP   02/18/2022

## 2022-02-28 DIAGNOSIS — M5459 Other low back pain: Secondary | ICD-10-CM | POA: Diagnosis not present

## 2022-02-28 DIAGNOSIS — M6281 Muscle weakness (generalized): Secondary | ICD-10-CM | POA: Diagnosis not present

## 2022-02-28 DIAGNOSIS — R2689 Other abnormalities of gait and mobility: Secondary | ICD-10-CM | POA: Diagnosis not present

## 2022-03-06 ENCOUNTER — Non-Acute Institutional Stay: Payer: PPO | Admitting: Adult Health

## 2022-03-06 ENCOUNTER — Encounter: Payer: Self-pay | Admitting: Adult Health

## 2022-03-06 DIAGNOSIS — F03A0 Unspecified dementia, mild, without behavioral disturbance, psychotic disturbance, mood disturbance, and anxiety: Secondary | ICD-10-CM | POA: Diagnosis not present

## 2022-03-06 DIAGNOSIS — M545 Low back pain, unspecified: Secondary | ICD-10-CM | POA: Diagnosis not present

## 2022-03-06 DIAGNOSIS — M8000XS Age-related osteoporosis with current pathological fracture, unspecified site, sequela: Secondary | ICD-10-CM | POA: Diagnosis not present

## 2022-03-06 NOTE — Progress Notes (Signed)
Location:  [place] Room Number: 812-A Place of Service:  ALF [phone]) Provider:  Durenda Age, DNP, FNP-BC  Patient Care Team: [name], MD as PCP - General (Family Medicine) [name] [name], [name], NP as Nurse Practitioner (Nurse Practitioner) [name], MD as Consulting Physician (Ophthalmology) [name], MD as Consulting Physician (Orthopedic Surgery) [name], [name], MD as Consulting Physician (Dermatology) [name], MD (Inactive) as Consulting Physician (Gastroenterology) [name], MD as Consulting Physician (General Surgery)  Extended Emergency Contact Information Primary Emergency Contact: Vicenta Dunning, [place] of [employer] Phone: [phone] Relation: Daughter  Code Status:  Full Code  Goals of care: Advanced Directive information    03/06/2022   10:32 AM  Advanced Directives  Does Patient Have a Medical Advance Directive? Yes  Type of Paramedic of [place];Living will;Out of facility DNR (pink MOST or yellow form)  Does patient want to make changes to medical advance directive? No - Patient declined  Copy of [place] in Chart? Yes - validated most recent copy scanned in chart (See row information)  Pre-existing out of facility DNR order (yellow form or pink MOST form) Pink MOST form placed in chart (order not valid for inpatient use)     Chief Complaint  Patient presents with   Acute Visit    Low back pain    HPI:  Pt is a 89 y.o. female seen today for an acute visit for low back pain. She has a PMH of cerebral atrophy, hyperlipidemia, syncope and aortic valve stenosis. She complains of bilateral lower back pain, 7-8/10. She stated that walking exacerbates her back pain. Imaging done on 02/10/22 showed no acute bone abnormality on her sacrum and coccyx. Lumbar x-ray showed compression fracture of uncertain age.  Thoracic imaging showed moderate degenerative changes and compression fracture of thoracic of uncertain age. Bilateral hips imaging showed no acute abnormality. She currently takes Fosamax for  osteoporosis.   Past Medical History:  Diagnosis Date   Cerebral atrophy ([place]) 01/21/2013   Hyperlipidemia    Hypothyroid    Osteoarthrosis, unspecified whether generalized or localized, unspecified site    Osteoporosis, unspecified    Other and unspecified hyperlipidemia    Other generalized ischemic cerebrovascular disease 01/21/2013   small vessel disease   Syncope and collapse    Umbilical hernia without mention of obstruction or gangrene    Unspecified hearing loss    Unspecified hypothyroidism    Unspecified vitamin D deficiency    Ventral hernia, unspecified, without mention of obstruction or gangrene    Past Surgical History:  Procedure Laterality Date   CATARACT EXTRACTION W/ INTRAOCULAR LENS  IMPLANT, BILATERAL  2012   Dr. Bing Plume   HERNIA REPAIR  01/2008   laparoscopic Dr. [name]   INGUINAL HERNIA REPAIR N/A 05/10/2019   Procedure: LAPAROSCOPIC FEMORAL HERNIA REPAIR WITH MESH;  Surgeon: Ralene Ok, MD;  Location: [facility];  Service: General;  Laterality: N/A;   KNEE SURGERY  2008   Dr. Alyssa Grove  left   TOE AMPUTATION  1986   little toe right foot  Dr. Dema Severin; [place]    Allergies  Allergen Reactions   Dust Mite Extract    Mushroom Extract Complex Other (See Comments)    "Allergic," per paperwork from facility   Penicillins Diarrhea and Other (See Comments)    "Allergic," per paperwork from facility  Outpatient Encounter Medications as of 03/06/2022  Medication Sig   acetaminophen (TYLENOL) 500 MG tablet Take 2 tablets (1,000 mg total) by mouth every 6 (six) hours as needed.   alendronate (FOSAMAX) 70 MG tablet Take 70 mg by mouth once a week. related to AGE-RELATED OSTEOPOROSIS WITHOUT CURRENT PATHOLOGICAL FRACTURE (M81.0)   aspirin 81 MG tablet Take 81 mg by  mouth daily.   Calcium Carbonate-Vitamin D 600-200 MG-UNIT TABS Take 1 tablet by mouth 2 (two) times daily.   hydrocortisone (PROCTO-MED HC) 2.5 % rectal cream Place 1 Application rectally as needed for hemorrhoids or anal itching.   levothyroxine (SYNTHROID) 100 MCG tablet Take 100 mcg by mouth daily.   lidocaine (ASPERCREME LIDOCAINE) 4 % Place 1 patch onto the skin daily. Apply to SOLE OF RIGHT FOOT   Multiple Vitamin (MULTIVITAMIN WITH MINERALS) TABS Take 1 tablet by mouth daily.   Omega-3 Fatty Acids (FISH OIL) 1200 MG CAPS Take 1 capsule by mouth daily.   polyethylene glycol (MIRALAX / GLYCOLAX) 17 g packet Take 17 g by mouth daily.   sennosides-docusate sodium (SENOKOT-S) 8.6-50 MG tablet Take 2 tablets by mouth daily.   Sodium Phosphates (FLEET ENEMA RE) Insert 1 unit rectally as needed for constipation repeat until positive results   traMADol (ULTRAM) 50 MG tablet Take 50 mg by mouth every 6 (six) hours as needed.   [DISCONTINUED] HYDROcodone-acetaminophen (NORCO/VICODIN) 5-325 MG tablet Take 0.5 tablets by mouth in the morning and at bedtime.   No facility-administered encounter medications on file as of 03/06/2022.    Review of Systems  Constitutional:  Negative for appetite change, chills, fatigue and fever.  HENT:  Negative for congestion, hearing loss, rhinorrhea and sore throat.   Eyes: Negative.   Respiratory:  Negative for cough, shortness of breath and wheezing.   Cardiovascular:  Negative for chest pain, palpitations and leg swelling.  Gastrointestinal:  Negative for abdominal pain, constipation, diarrhea, nausea and vomiting.  Genitourinary:  Negative for dysuria.  Musculoskeletal:  Positive for back pain. Negative for arthralgias and myalgias.  Skin:  Negative for color change, rash and wound.  Neurological:  Negative for dizziness, weakness and headaches.  Psychiatric/Behavioral:  Negative for behavioral problems. The patient is not nervous/anxious.        Immunization History  Administered Date(s) Administered   Influenza Whole 10/28/2011, 11/10/2012, 10/29/2017   Influenza, High Dose Seasonal PF 11/05/2016, 11/11/2018, 11/12/2020   Influenza-Unspecified 11/14/2013, 10/26/2014, 11/08/2015, 11/09/2019, 11/18/2021   Moderna Sars-Covid-2 Vaccination 01/31/2019, 02/28/2019, 12/06/2019, 06/26/2020   PFIZER(Purple Top)SARS-COV-2 Vaccination 10/16/2020   PPD Test 02/27/2021   Pfizer Covid-19 Vaccine Bivalent Booster 60yr & up 11/28/2021   Pneumococcal Conjugate-13 02/05/2017   Pneumococcal Polysaccharide-23 02/08/2007   Td 01/28/1991, 06/12/2017   Tdap 06/12/2017   Zoster Recombinat (Shingrix) 07/09/2017, 09/15/2017   Zoster, Live 12/27/2012   Pertinent  Health Maintenance Due  Topic Date Due   INFLUENZA VACCINE  Completed   DEXA SCAN  Completed      09/02/2019   12:05 PM 04/20/2020   10:57 AM 02/15/2021   12:46 PM 02/16/2021    2:41 AM 02/18/2022   10:36 AM  Fall Risk  Falls in the past year? 1 0   1  Was there an injury with Fall? 1    1  Fall Risk Category Calculator 2    3  Fall Risk Category (Retired) Moderate      (RETIRED) Patient Fall Risk Level   Low fall risk High fall risk   Patient at  Risk for Falls Due to     History of fall(s);Impaired balance/gait;Impaired mobility  Fall risk Follow up     Education provided     Vitals:   03/06/22 1022  BP: 125/75  Pulse: 98  Resp: 16  Temp: (!) 97.4 F (36.3 C)  SpO2: 96%  Weight: 136 lb 3.2 oz (61.8 kg)  Height: 4' 10"$  (1.473 m)   Body mass index is 28.47 kg/m.  Physical Exam Constitutional:      Appearance: Normal appearance.  HENT:     Head: Normocephalic and atraumatic.     Nose: Nose normal.     Mouth/Throat:     Mouth: Mucous membranes are moist.  Eyes:     Conjunctiva/sclera: Conjunctivae normal.  Cardiovascular:     Rate and Rhythm: Normal rate and regular rhythm.     Heart sounds: Murmur heard.  Pulmonary:     Effort: Pulmonary effort is normal.      Breath sounds: Normal breath sounds.  Abdominal:     General: Bowel sounds are normal.     Palpations: Abdomen is soft.  Musculoskeletal:        General: Normal range of motion.     Cervical back: Normal range of motion.  Skin:    General: Skin is warm and dry.  Neurological:     General: No focal deficit present.     Mental Status: She is alert.     Comments: Alert to self and place, disoriented to time.  Psychiatric:        Mood and Affect: Mood normal.        Behavior: Behavior normal.        Thought Content: Thought content normal.        Judgment: Judgment normal.       Labs reviewed: Recent Labs    04/18/21 0000 02/10/22 0000  NA 142 133*  K 4.3 4.7  CL 105 101  CO2 25* 23*  BUN 20 18  CREATININE 1.0 1.0  CALCIUM 9.3 9.1   Recent Labs    04/18/21 0000 02/10/22 0000  AST 15 21  ALT 8 13  ALKPHOS 81 70  ALBUMIN 3.9 3.9   Recent Labs    04/18/21 0000 02/10/22 0000  WBC 5.5 8.7  NEUTROABS  --  6,586.00  HGB 12.5 11.7*  HCT 37 36  PLT 330 375   Lab Results  Component Value Date   TSH 0.64 05/30/2021   Lab Results  Component Value Date   HGBA1C 5.9 (H) 01/31/2020   Lab Results  Component Value Date   CHOL 205 (H) 11/22/2020   HDL 65 11/22/2020   LDLCALC 110 (H) 11/22/2020   TRIG 180 (H) 11/22/2020   CHOLHDL 3.2 11/22/2020    Significant Diagnostic Results in last 30 days:  No results found.  Assessment/Plan  1. Acute bilateral low back pain without sciatica - thought to be due to compression fracture and  moderate degenerative changes on her lumbar and and thoracic spine -  continue Norco 5-325 mg 0.5 tab PO TID for pain and Tramadol PRN -  will need neurosurgery consult   2. Age-related osteoporosis with current pathological fracture, sequela -  continue Fosmax -  fall precautions  3. Mild dementia without behavioral disturbance, psychotic disturbance, mood disturbance, or anxiety, unspecified dementia type ([place]) -  MMSE score  24/30 -  fall precautions   Family/ staff Communication: Discussed plan of care with resident and charge nurse.  Labs/tests ordered:  None    Durenda Age, DNP, MSN, FNP-BC [facility] [phone] (Monday-Friday 8:00 a.m. - 5:00 p.m.) [phone] (after hours)

## 2022-03-07 ENCOUNTER — Other Ambulatory Visit: Payer: Self-pay | Admitting: Adult Health

## 2022-03-07 MED ORDER — HYDROCODONE-ACETAMINOPHEN 5-325 MG PO TABS: 0.5000 | ORAL_TABLET | Freq: Three times a day (TID) | ORAL | 0 refills | Status: DC

## 2022-03-19 ENCOUNTER — Non-Acute Institutional Stay: Payer: PPO | Admitting: Adult Health

## 2022-03-19 ENCOUNTER — Encounter: Payer: Self-pay | Admitting: Adult Health

## 2022-03-19 DIAGNOSIS — R509 Fever, unspecified: Secondary | ICD-10-CM | POA: Diagnosis not present

## 2022-03-19 DIAGNOSIS — E039 Hypothyroidism, unspecified: Secondary | ICD-10-CM

## 2022-03-19 NOTE — Progress Notes (Signed)
Location:  [place] Room Number: AL/812/A Place of Service:  ALF [phone]) Provider:  Durenda Age, DNP, FNP-BC  Patient Care Team: [name], MD as PCP - General (Family Medicine) [name], [name], NP as Nurse Practitioner (Nurse Practitioner) [name], MD as Consulting Physician (Ophthalmology) [name], MD as Consulting Physician (Orthopedic Surgery) [name], [name], MD as Consulting Physician (Dermatology) [name], MD (Inactive) as Consulting Physician (Gastroenterology) [name], MD as Consulting Physician (General Surgery)  Extended Emergency Contact Information Primary Emergency Contact: Vicenta Dunning, [place] of [employer] Phone: [phone] Relation: Daughter  Code Status:  FULL  Goals of care: Advanced Directive information    03/06/2022   10:32 AM  Advanced Directives  Does Patient Have a Medical Advance Directive? Yes  Type of Paramedic of [place];Living will;Out of facility DNR (pink MOST or yellow form)  Does patient want to make changes to medical advance directive? No - Patient declined  Copy of [place] in Chart? Yes - validated most recent copy scanned in chart (See row information)  Pre-existing out of facility DNR order (yellow form or pink MOST form) Pink MOST form placed in chart (order not valid for inpatient use)     Chief Complaint  Patient presents with   Acute Visit    Patient is being seen for feeling hot    HPI:  Pt is a 89 y.o. female seen today for an acute visit for "feeling hot" this morning. Temperature today was 97.8. She stated that she feels cold most of the time. No reported fever. She said that "it seems her head feels hot but her hand feels cold." She takes Levothyroxine for hypothyroidism.   Past Medical History:  Diagnosis Date   Cerebral atrophy ([place]) 01/21/2013    Hyperlipidemia    Hypothyroid    Osteoarthrosis, unspecified whether generalized or localized, unspecified site    Osteoporosis, unspecified    Other and unspecified hyperlipidemia    Other generalized ischemic cerebrovascular disease 01/21/2013   small vessel disease   Syncope and collapse    Umbilical hernia without mention of obstruction or gangrene    Unspecified hearing loss    Unspecified hypothyroidism    Unspecified vitamin D deficiency    Ventral hernia, unspecified, without mention of obstruction or gangrene    Past Surgical History:  Procedure Laterality Date   CATARACT EXTRACTION W/ INTRAOCULAR LENS  IMPLANT, BILATERAL  2012   Dr. Bing Plume   HERNIA REPAIR  01/2008   laparoscopic Dr. [name]   INGUINAL HERNIA REPAIR N/A 05/10/2019   Procedure: LAPAROSCOPIC FEMORAL HERNIA REPAIR WITH MESH;  Surgeon: Ralene Ok, MD;  Location: [facility];  Service: General;  Laterality: N/A;   KNEE SURGERY  2008   Dr. Alyssa Grove  left   TOE AMPUTATION  1986   little toe right foot  Dr. Dema Severin; [place]    Allergies  Allergen Reactions   Dust Mite Extract    Mushroom Extract Complex Other (See Comments)    "Allergic," per paperwork from facility   Penicillins Diarrhea and Other (See Comments)    "Allergic," per paperwork from facility    Outpatient Encounter Medications as of 03/19/2022  Medication Sig   acetaminophen (TYLENOL) 500 MG tablet Take 2 tablets (1,000 mg total) by mouth every 6 (six) hours as needed.   alendronate (FOSAMAX) 70 MG tablet Take 70  mg by mouth once a week. related to AGE-RELATED OSTEOPOROSIS WITHOUT CURRENT PATHOLOGICAL FRACTURE (M81.0)   aspirin 81 MG tablet Take 81 mg by mouth daily.   Calcium Carbonate-Vitamin D 600-200 MG-UNIT TABS Take 1 tablet by mouth 2 (two) times daily.   HYDROcodone-acetaminophen (NORCO/VICODIN) 5-325 MG tablet Take 0.5 tablets by mouth 3 (three) times daily.   hydrocortisone (PROCTO-MED HC) 2.5 % rectal cream Place 1 Application  rectally as needed for hemorrhoids or anal itching.   levothyroxine (SYNTHROID) 100 MCG tablet Take 100 mcg by mouth daily.   lidocaine (ASPERCREME LIDOCAINE) 4 % Place 1 patch onto the skin daily. Apply to SOLE OF RIGHT FOOT   Multiple Vitamin (MULTIVITAMIN WITH MINERALS) TABS Take 1 tablet by mouth daily.   Omega-3 Fatty Acids (FISH OIL) 1200 MG CAPS Take 1 capsule by mouth daily.   polyethylene glycol (MIRALAX / GLYCOLAX) 17 g packet Take 17 g by mouth daily.   sennosides-docusate sodium (SENOKOT-S) 8.6-50 MG tablet Take 2 tablets by mouth daily.   Sodium Phosphates (FLEET ENEMA RE) Insert 1 unit rectally as needed for constipation repeat until positive results   traMADol (ULTRAM) 50 MG tablet Take 50 mg by mouth every 6 (six) hours as needed.   No facility-administered encounter medications on file as of 03/19/2022.    Review of Systems  Constitutional:  Positive for chills. Negative for appetite change, fatigue and fever.  HENT:  Negative for congestion, hearing loss, rhinorrhea and sore throat.   Eyes: Negative.   Respiratory:  Negative for cough, shortness of breath and wheezing.   Cardiovascular:  Negative for chest pain, palpitations and leg swelling.  Gastrointestinal:  Negative for abdominal pain, constipation, diarrhea, nausea and vomiting.  Genitourinary:  Negative for dysuria.  Musculoskeletal:  Negative for arthralgias, back pain and myalgias.  Skin:  Negative for color change, rash and wound.  Neurological:  Negative for dizziness, weakness and headaches.  Psychiatric/Behavioral:  Negative for behavioral problems. The patient is not nervous/anxious.        Immunization History  Administered Date(s) Administered   Influenza Whole 10/28/2011, 11/10/2012, 10/29/2017   Influenza, High Dose Seasonal PF 11/05/2016, 11/11/2018, 11/12/2020   Influenza-Unspecified 11/14/2013, 10/26/2014, 11/08/2015, 11/09/2019, 11/18/2021   Moderna Sars-Covid-2 Vaccination 01/31/2019,  02/28/2019, 12/06/2019, 06/26/2020   PFIZER(Purple Top)SARS-COV-2 Vaccination 10/16/2020   PPD Test 02/27/2021   Pfizer Covid-19 Vaccine Bivalent Booster 63yr & up 11/28/2021   Pneumococcal Conjugate-13 02/05/2017   Pneumococcal Polysaccharide-23 02/08/2007   Td 01/28/1991, 06/12/2017   Tdap 06/12/2017   Zoster Recombinat (Shingrix) 07/09/2017, 09/15/2017   Zoster, Live 12/27/2012   Pertinent  Health Maintenance Due  Topic Date Due   INFLUENZA VACCINE  Completed   DEXA SCAN  Completed      09/02/2019   12:05 PM 04/20/2020   10:57 AM 02/15/2021   12:46 PM 02/16/2021    2:41 AM 02/18/2022   10:36 AM  Fall Risk  Falls in the past year? 1 0   1  Was there an injury with Fall? 1    1  Fall Risk Category Calculator 2    3  Fall Risk Category (Retired) Moderate      (RETIRED) Patient Fall Risk Level   Low fall risk High fall risk   Patient at Risk for Falls Due to     History of fall(s);Impaired balance/gait;Impaired mobility  Fall risk Follow up     Education provided     Vitals:   03/19/22 1054  BP: 138/68  Pulse: 86  Resp: 18  Temp: 97.8 F (36.6 C)  SpO2: 94%  Weight: 136 lb 3.2 oz (61.8 kg)  Height: '4\' 10"'$  (1.473 m)   Body mass index is 28.47 kg/m.  Physical Exam Constitutional:      Appearance: Normal appearance.  HENT:     Head: Normocephalic and atraumatic.     Nose: Nose normal.     Mouth/Throat:     Mouth: Mucous membranes are moist.  Eyes:     Conjunctiva/sclera: Conjunctivae normal.  Cardiovascular:     Rate and Rhythm: Normal rate and regular rhythm.  Pulmonary:     Effort: Pulmonary effort is normal.     Breath sounds: Normal breath sounds.  Abdominal:     General: Bowel sounds are normal.     Palpations: Abdomen is soft.  Musculoskeletal:        General: Normal range of motion.     Cervical back: Normal range of motion.  Skin:    General: Skin is warm and dry.  Neurological:     General: No focal deficit present.     Mental Status: She is  alert.     Comments: Alert to self and place, disoriented to time.  Psychiatric:        Mood and Affect: Mood normal.        Behavior: Behavior normal.        Labs reviewed: Recent Labs    04/18/21 0000 02/10/22 0000  NA 142 133*  K 4.3 4.7  CL 105 101  CO2 25* 23*  BUN 20 18  CREATININE 1.0 1.0  CALCIUM 9.3 9.1   Recent Labs    04/18/21 0000 02/10/22 0000  AST 15 21  ALT 8 13  ALKPHOS 81 70  ALBUMIN 3.9 3.9   Recent Labs    04/18/21 0000 02/10/22 0000  WBC 5.5 8.7  NEUTROABS  --  6,586.00  HGB 12.5 11.7*  HCT 37 36  PLT 330 375   Lab Results  Component Value Date   TSH 0.64 05/30/2021   Lab Results  Component Value Date   HGBA1C 5.9 (H) 01/31/2020   Lab Results  Component Value Date   CHOL 205 (H) 11/22/2020   HDL 65 11/22/2020   LDLCALC 110 (H) 11/22/2020   TRIG 180 (H) 11/22/2020   CHOLHDL 3.2 11/22/2020    Significant Diagnostic Results in last 30 days:  No results found.  Assessment/Plan  1. Feels feverish -  temp 97.8, no reported fever -  no cough nor shortness of breath -  will check CBC, BMP   2. Hypothyroidism, unspecified type Lab Results  Component Value Date   TSH 0.64 05/30/2021    -  continue Levothyroxine -  repeat tsh  Family/ staff Communication: Discussed plan of care with resident and charge nurse.  Labs/tests ordered: tsh, CBC and BMP with GFR    Durenda Age, DNP, MSN, FNP-BC [place] [phone] (Monday-Friday 8:00 a.m. - 5:00 p.m.) [phone] (after hours)

## 2022-03-20 DIAGNOSIS — E039 Hypothyroidism, unspecified: Secondary | ICD-10-CM | POA: Diagnosis not present

## 2022-03-20 DIAGNOSIS — N189 Chronic kidney disease, unspecified: Secondary | ICD-10-CM | POA: Diagnosis not present

## 2022-03-20 DIAGNOSIS — I10 Essential (primary) hypertension: Secondary | ICD-10-CM | POA: Diagnosis not present

## 2022-03-20 LAB — CBC AND DIFFERENTIAL
HCT: 37 (ref 36–46)
Hemoglobin: 12.5 (ref 12.0–16.0)
Neutrophils Absolute: 3805
Platelets: 397 10*3/uL (ref 150–400)
WBC: 6.3

## 2022-03-20 LAB — BASIC METABOLIC PANEL
BUN: 12 (ref 4–21)
CO2: 23 — AB (ref 13–22)
Chloride: 106 (ref 99–108)
Creatinine: 1.1 (ref 0.5–1.1)
Glucose: 116
Potassium: 4.4 mEq/L (ref 3.5–5.1)
Sodium: 139 (ref 137–147)

## 2022-03-20 LAB — TSH: TSH: 2.25 (ref 0.41–5.90)

## 2022-03-20 LAB — CBC: RBC: 4.19 (ref 3.87–5.11)

## 2022-04-02 DIAGNOSIS — H524 Presbyopia: Secondary | ICD-10-CM | POA: Diagnosis not present

## 2022-04-02 DIAGNOSIS — H349 Unspecified retinal vascular occlusion: Secondary | ICD-10-CM | POA: Diagnosis not present

## 2022-04-02 DIAGNOSIS — H52203 Unspecified astigmatism, bilateral: Secondary | ICD-10-CM | POA: Diagnosis not present

## 2022-04-02 DIAGNOSIS — Z961 Presence of intraocular lens: Secondary | ICD-10-CM | POA: Diagnosis not present

## 2022-04-04 ENCOUNTER — Other Ambulatory Visit: Payer: Self-pay

## 2022-04-04 NOTE — Telephone Encounter (Signed)
Refill request for hydrocodone 5/325 mg 1/2 tablet three times a day  Medication pended and sent to Marlowe Sax, NP (covering provider)

## 2022-04-07 DIAGNOSIS — S32000A Wedge compression fracture of unspecified lumbar vertebra, initial encounter for closed fracture: Secondary | ICD-10-CM | POA: Diagnosis not present

## 2022-04-07 DIAGNOSIS — S22000A Wedge compression fracture of unspecified thoracic vertebra, initial encounter for closed fracture: Secondary | ICD-10-CM | POA: Diagnosis not present

## 2022-04-07 DIAGNOSIS — Z6828 Body mass index (BMI) 28.0-28.9, adult: Secondary | ICD-10-CM | POA: Diagnosis not present

## 2022-04-07 MED ORDER — HYDROCORTISONE (PERIANAL) 2.5 % EX CREA: 1.0000 | TOPICAL_CREAM | CUTANEOUS | 0 refills | Status: DC | PRN

## 2022-04-08 DIAGNOSIS — M6281 Muscle weakness (generalized): Secondary | ICD-10-CM | POA: Diagnosis not present

## 2022-04-08 DIAGNOSIS — R2689 Other abnormalities of gait and mobility: Secondary | ICD-10-CM | POA: Diagnosis not present

## 2022-04-08 DIAGNOSIS — M5459 Other low back pain: Secondary | ICD-10-CM | POA: Diagnosis not present

## 2022-04-16 ENCOUNTER — Non-Acute Institutional Stay: Payer: PPO | Admitting: Adult Health

## 2022-04-16 ENCOUNTER — Encounter: Payer: Self-pay | Admitting: Adult Health

## 2022-04-16 DIAGNOSIS — M85852 Other specified disorders of bone density and structure, left thigh: Secondary | ICD-10-CM | POA: Diagnosis not present

## 2022-04-16 DIAGNOSIS — R52 Pain, unspecified: Secondary | ICD-10-CM | POA: Diagnosis not present

## 2022-04-16 NOTE — Progress Notes (Signed)
5  Location:  [facility] Room Number: 812-A Place of Service:  ALF [phone]) Provider:  Durenda Age, DNP, FNP-BC  Patient Care Team: [name], MD as PCP - General (Family Medicine) [name], [name], NP as Nurse Practitioner (Nurse Practitioner) [name], MD as Consulting Physician (Ophthalmology) [name], MD as Consulting Physician (Orthopedic Surgery) [name], [name], MD as Consulting Physician (Dermatology) [name], MD (Inactive) as Consulting Physician (Gastroenterology) [name], MD as Consulting Physician (General Surgery)  Extended Emergency Contact Information Primary Emergency Contact: Vicenta Dunning, [place] of [employer] Phone: [phone] Relation: Daughter  Code Status:  Full Code   Goals of care: Advanced Directive information    04/16/2022    3:41 PM  Advanced Directives  Does Patient Have a Medical Advance Directive? Yes  Type of Paramedic of [place];Living will;Out of facility DNR (pink MOST or yellow form)  Does patient want to make changes to medical advance directive? No - Patient declined  Copy of [place] in Chart? Yes - validated most recent copy scanned in chart (See row information)  Pre-existing out of facility DNR order (yellow form or pink MOST form) Pink MOST form placed in chart (order not valid for inpatient use)     Chief Complaint  Patient presents with   Acute Visit    Pain management     HPI:  Pt is a 89 y.o. female seen today for an acute visit for pain management. She has chronic low back pain and takes  Norco 5-325 mg  0.5 tab PO TID and Tramadol 50 mg Q 6 hours PRN. Resident's pain has improved. She has been ambulating more. Daughter has requested pain medication to be decreased. Imaging of lumbar spine on 02/10/22 showed compression fracture of uncertain age and thoracic imaging showed  moderate degenerative changes and compression fracture of thoracic of uncertain age. She takes Fosamax for osteopenia.   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 Bing Plume   HERNIA REPAIR  01/2008   laparoscopic Dr. [name]   INGUINAL HERNIA REPAIR N/A 05/10/2019   Procedure: LAPAROSCOPIC FEMORAL HERNIA REPAIR WITH MESH;  Surgeon: Ralene Ok, MD;  Location: [facility];  Service: General;  Laterality: N/A;   KNEE SURGERY  2008   Dr. Alyssa Grove  left   TOE AMPUTATION  1986   little toe right foot  Dr. Dema Severin; [place]    Allergies  Allergen Reactions   Dust Mite Extract    Mushroom Extract Complex Other (See Comments)    "Allergic," per paperwork from facility   Penicillins Diarrhea and Other (See Comments)    "Allergic," per paperwork from facility    Outpatient Encounter Medications  as of 04/16/2022  Medication Sig   acetaminophen (TYLENOL) 500 MG tablet Take 2 tablets (1,000 mg total) by mouth every 6 (six) hours as needed.   alendronate (FOSAMAX) 70 MG tablet Take 70 mg by mouth once a week. related to AGE-RELATED OSTEOPOROSIS WITHOUT CURRENT PATHOLOGICAL FRACTURE (M81.0)   aspirin 81 MG tablet Take 81 mg by mouth daily.   Calcium Carbonate-Vitamin D 600-200 MG-UNIT TABS Take 1 tablet by mouth 2  (two) times daily.   HYDROcodone-acetaminophen (NORCO/VICODIN) 5-325 MG tablet Take 0.5 tablets by mouth 3 (three) times daily.   HYDROcodone-acetaminophen (NORCO/VICODIN) 5-325 MG tablet Take 0.5 tablets by mouth 2 (two) times daily. Two listings per [employer] at [facility]   hydrocortisone (PROCTO-MED [facility]) 2.5 % rectal cream Place 1 Application rectally as needed for hemorrhoids or anal itching.   levothyroxine (SYNTHROID) 100 MCG tablet Take 100 mcg by mouth daily.   lidocaine (ASPERCREME LIDOCAINE) 4 % Place 1 patch onto the skin daily. Apply to SOLE OF RIGHT FOOT   Multiple Vitamin (MULTIVITAMIN WITH MINERALS) TABS Take 1 tablet by mouth daily.   Omega-3 Fatty Acids (FISH OIL) 1200 MG CAPS Take 1 capsule by mouth daily.   polyethylene glycol (MIRALAX / GLYCOLAX) 17 g packet Take 17 g by mouth daily.   sennosides-docusate sodium (SENOKOT-S) 8.6-50 MG tablet Take 2 tablets by mouth 2 (two) times daily.   Sodium Phosphates (FLEET ENEMA RE) Insert 1 unit rectally as needed for constipation repeat until positive results   traMADol (ULTRAM) 50 MG tablet Take 50 mg by mouth every 6 (six) hours as needed.   No facility-administered encounter medications on file as of 04/16/2022.    Review of Systems  Constitutional:  Negative for appetite change, chills, fatigue and fever.  HENT:  Negative for congestion, hearing loss, rhinorrhea and sore throat.   Eyes: Negative.   Respiratory:  Negative for cough, shortness of breath and wheezing.   Cardiovascular:  Negative for chest pain, palpitations and leg swelling.  Gastrointestinal:  Negative for abdominal pain, constipation, diarrhea, nausea and vomiting.  Genitourinary:  Negative for dysuria.  Musculoskeletal:  Negative for arthralgias, back pain and myalgias.  Skin:  Negative for color change, rash and wound.  Neurological:  Negative for dizziness, weakness and headaches.  Psychiatric/Behavioral:  Negative for behavioral problems.  The patient is not nervous/anxious.        Immunization History  Administered Date(s) Administered   Influenza Whole 10/28/2011, 11/10/2012, 10/29/2017   Influenza, High Dose Seasonal PF 11/05/2016, 11/11/2018, 11/12/2020   Influenza-Unspecified 11/14/2013, 10/26/2014, 11/08/2015, 11/09/2019, 11/18/2021   Moderna Sars-Covid-2 Vaccination 01/31/2019, 02/28/2019, 12/06/2019, 06/26/2020   PFIZER(Purple Top)SARS-COV-2 Vaccination 10/16/2020   PPD Test 02/27/2021   Pfizer Covid-19 Vaccine Bivalent Booster 58yrs & up 11/28/2021   Pneumococcal Conjugate-13 02/05/2017   Pneumococcal Polysaccharide-23 02/08/2007   Td 01/28/1991, 06/12/2017   Tdap 06/12/2017   Zoster Recombinat (Shingrix) 07/09/2017, 09/15/2017   Zoster, Live 12/27/2012   Pertinent  Health Maintenance Due  Topic Date Due   INFLUENZA VACCINE  Completed   DEXA SCAN  Completed      09/02/2019   12:05 PM 04/20/2020   10:57 AM 02/15/2021   12:46 PM 02/16/2021    2:41 AM 02/18/2022   10:36 AM  Fall Risk  Falls in the past year? 1 0   1  Was there an injury with Fall? 1    1  Fall Risk Category Calculator 2    3  Fall Risk Category (Retired) Moderate      (  RETIRED) Patient Fall Risk Level   Low fall risk High fall risk   Patient at Risk for Falls Due to     History of fall(s);Impaired balance/gait;Impaired mobility  Fall risk Follow up     Education provided     Vitals:   04/16/22 1540  BP: 134/62  Pulse: 81  Weight: 132 lb (59.9 kg)  Height: 4\' 10"  (1.473 m)   Body mass index is 27.59 kg/m.  Physical Exam Constitutional:      Appearance: Normal appearance.  HENT:     Head: Normocephalic and atraumatic.     Nose: Nose normal.     Mouth/Throat:     Mouth: Mucous membranes are moist.  Eyes:     Conjunctiva/sclera: Conjunctivae normal.  Cardiovascular:     Rate and Rhythm: Normal rate and regular rhythm.  Pulmonary:     Effort: Pulmonary effort is normal.     Breath sounds: Normal breath sounds.  Abdominal:      General: Bowel sounds are normal.     Palpations: Abdomen is soft.  Musculoskeletal:        General: Normal range of motion.     Cervical back: Normal range of motion.  Skin:    General: Skin is warm and dry.  Neurological:     Mental Status: She is alert. Mental status is at baseline.     Comments: Alert to self and place, disoriented to time  Psychiatric:        Mood and Affect: Mood normal.        Behavior: Behavior normal.        Labs reviewed: Recent Labs    04/18/21 0000 02/10/22 0000 03/20/22 0000  NA 142 133* 139  K 4.3 4.7 4.4  CL 105 101 106  CO2 25* 23* 23*  BUN 20 18 12   CREATININE 1.0 1.0 1.1  CALCIUM 9.3 9.1  --    Recent Labs    04/18/21 0000 02/10/22 0000  AST 15 21  ALT 8 13  ALKPHOS 81 70  ALBUMIN 3.9 3.9   Recent Labs    04/18/21 0000 02/10/22 0000 03/20/22 0000  WBC 5.5 8.7 6.3  NEUTROABS  --  6,586.00 3,805.00  HGB 12.5 11.7* 12.5  HCT 37 36 37  PLT 330 375 397   Lab Results  Component Value Date   TSH 2.25 03/20/2022   Lab Results  Component Value Date   HGBA1C 5.9 (H) 01/31/2020   Lab Results  Component Value Date   CHOL 205 (H) 11/22/2020   HDL 65 11/22/2020   LDLCALC 110 (H) 11/22/2020   TRIG 180 (H) 11/22/2020   CHOLHDL 3.2 11/22/2020    Significant Diagnostic Results in last 30 days:  No results found.  Assessment/Plan  1. Pain management -  back pain improved -  decrease Norco 5-325 mg 0.5 tab TID to BID and continue Tramadol PRN  2. Osteopenia of neck of left femur -  continue Fosamax -  fall precautions   Family/ staff Communication: Discussed plan of care with resident and charge nurse  Labs/tests ordered: None    Durenda Age, DNP, MSN, FNP-BC [facility] [phone] (Monday-Friday 8:00 a.m. - 5:00 p.m.) [phone] (after hours)

## 2022-04-28 ENCOUNTER — Encounter: Payer: Self-pay | Admitting: Nurse Practitioner

## 2022-04-28 DIAGNOSIS — W19XXXA Unspecified fall, initial encounter: Secondary | ICD-10-CM | POA: Insufficient documentation

## 2022-05-16 ENCOUNTER — Other Ambulatory Visit: Payer: Self-pay | Admitting: Adult Health

## 2022-05-16 MED ORDER — HYDROCODONE-ACETAMINOPHEN 5-325 MG PO TABS: 0.5000 | ORAL_TABLET | Freq: Two times a day (BID) | ORAL | 0 refills | Status: DC

## 2022-05-19 ENCOUNTER — Non-Acute Institutional Stay: Payer: PPO | Admitting: Adult Health

## 2022-05-19 ENCOUNTER — Encounter: Payer: Self-pay | Admitting: Adult Health

## 2022-05-19 DIAGNOSIS — E039 Hypothyroidism, unspecified: Secondary | ICD-10-CM | POA: Diagnosis not present

## 2022-05-19 DIAGNOSIS — R600 Localized edema: Secondary | ICD-10-CM | POA: Diagnosis not present

## 2022-05-19 NOTE — Progress Notes (Signed)
Location:  [name] Room Number: [id number] Place of Service:  ALF [phone]) Provider:  Margit Banda.Grayce Sessions, NP   Patient Care Team: Frederica Kuster, MD as PCP - General (Family Medicine) [name], [name], NP as Nurse Practitioner (Nurse Practitioner) Nelson Chimes, MD as Consulting Physician (Ophthalmology) Marcene Corning, MD as Consulting Physician (Orthopedic Surgery) [place], [name], MD as Consulting Physician (Dermatology) Hart Carwin, MD (Inactive) as Consulting Physician (Gastroenterology) Ovidio Kin, MD as Consulting Physician (General Surgery)  Extended Emergency Contact Information Primary Emergency Contact: Dallas Schimke, [place] of [place] Phone: [phone] Relation: Daughter  Code Status:  DNR Goals of care: Advanced Directive information    05/19/2022   11:19 AM  Advanced Directives  Does Patient Have a Medical Advance Directive? Yes  Type of Estate agent of [place];Living will;Out of facility DNR (pink MOST or yellow form)  Does patient want to make changes to medical advance directive? No - Patient declined  Copy of Healthcare Power of Attorney in Chart? Yes - validated most recent copy scanned in chart (See row information)  Pre-existing out of facility DNR order (yellow form or pink MOST form) Pink MOST form placed in chart (order not valid for inpatient use)     Chief Complaint  Patient presents with   Acute Visit    Leg edema    HPI:  Pt is a 89 y.o. female seen today for an acute visit for leg edema. She is a resident of [name]. She walks with a walker. She has trace edema of BLE. She denies pain when walking and +for pedal pulse.  She takes Levothyroxine for hypothyroidism.    Past Medical History:  Diagnosis Date   Cerebral atrophy 01/21/2013   Hyperlipidemia    Hypothyroid    Osteoarthrosis, unspecified whether generalized or  localized, unspecified site    Osteoporosis, unspecified    Other and unspecified hyperlipidemia    Other generalized ischemic cerebrovascular disease 01/21/2013   small vessel disease   Syncope and collapse    Umbilical hernia without mention of obstruction or gangrene    Unspecified hearing loss    Unspecified hypothyroidism    Unspecified vitamin D deficiency    Ventral hernia, unspecified, without mention of obstruction or gangrene    Past Surgical History:  Procedure Laterality Date   CATARACT EXTRACTION W/ INTRAOCULAR LENS  IMPLANT, BILATERAL  2012   Dr. Hazle Quant   HERNIA REPAIR  01/2008   laparoscopic Dr. Ovidio Kin   INGUINAL HERNIA REPAIR N/A 05/10/2019   Procedure: LAPAROSCOPIC FEMORAL HERNIA REPAIR WITH MESH;  Surgeon: Axel Filler, MD;  Location: [facility] OR;  Service: General;  Laterality: N/A;   KNEE SURGERY  2008   Dr. Berneice Heinrich  left   TOE AMPUTATION  1986   little toe right foot  Dr. Cliffton Asters; [place]    Allergies  Allergen Reactions   Dust Mite Extract    Mushroom Extract Complex Other (See Comments)    "Allergic," per paperwork from facility   Penicillins Diarrhea and Other (See Comments)    "Allergic," per paperwork from facility    Outpatient Encounter Medications as of 05/19/2022  Medication Sig   acetaminophen (TYLENOL) 500 MG tablet Take 2 tablets (1,000 mg total) by mouth every 6 (six) hours as needed.   alendronate (FOSAMAX) 70 MG tablet Take 70 mg by mouth once a week. related to AGE-RELATED  OSTEOPOROSIS WITHOUT CURRENT PATHOLOGICAL FRACTURE (M81.0)   aspirin 81 MG tablet Take 81 mg by mouth daily.   Calcium Carbonate-Vitamin D 600-200 MG-UNIT TABS Take 1 tablet by mouth 2 (two) times daily.   HYDROcodone-acetaminophen (NORCO/VICODIN) 5-325 MG tablet Take 0.5 tablets by mouth 3 (three) times daily.   HYDROcodone-acetaminophen (NORCO/VICODIN) 5-325 MG tablet Take 0.5 tablets by mouth 2 (two) times daily. Two listings per [employer] at [facility]   hydrocortisone (PROCTO-MED [facility]) 2.5 % rectal cream Place 1 Application rectally as needed for hemorrhoids or anal itching.   levothyroxine (SYNTHROID) 100 MCG tablet Take 100 mcg by mouth daily.   lidocaine (ASPERCREME LIDOCAINE) 4 % Place 1 patch onto the skin daily. Apply to SOLE OF RIGHT FOOT   Multiple Vitamin (MULTIVITAMIN WITH MINERALS) TABS Take 1 tablet by mouth daily.   Omega-3 Fatty Acids (FISH OIL) 1200 MG CAPS Take 1 capsule by mouth daily.   polyethylene glycol (MIRALAX / GLYCOLAX) 17 g packet Take 17 g by mouth daily.   sennosides-docusate sodium (SENOKOT-S) 8.6-50 MG tablet Take 2 tablets by mouth 2 (two) times daily.   Sodium Phosphates (FLEET ENEMA RE) Insert 1 unit rectally as needed for constipation repeat until positive results   traMADol (ULTRAM) 50 MG tablet Take 50 mg by mouth every 6 (six) hours as needed.   No facility-administered encounter medications on file as of 05/19/2022.    Review of Systems  Constitutional:  Negative for appetite change, chills, fatigue and fever.  HENT:  Negative for congestion, hearing loss, rhinorrhea and sore throat.   Eyes: Negative.   Respiratory:  Negative for cough, shortness of breath and wheezing.   Cardiovascular:  Positive for leg swelling. Negative for chest pain and palpitations.  Gastrointestinal:  Negative for abdominal pain, constipation, diarrhea, nausea and vomiting.  Genitourinary:  Negative for dysuria.  Musculoskeletal:  Negative for arthralgias, back pain and myalgias.  Skin:  Negative for color change, rash and wound.  Neurological:  Negative for dizziness, weakness and headaches.  Psychiatric/Behavioral:  Negative for behavioral problems. The patient is not nervous/anxious.     Immunization History  Administered Date(s) Administered   Influenza Whole 10/28/2011, 11/10/2012, 10/29/2017   Influenza, High Dose Seasonal PF 11/05/2016, 11/11/2018, 11/12/2020   Influenza-Unspecified 11/14/2013, 10/26/2014,  11/08/2015, 11/09/2019, 11/18/2021   Moderna Sars-Covid-2 Vaccination 01/31/2019, 02/28/2019, 12/06/2019, 06/26/2020   PFIZER(Purple Top)SARS-COV-2 Vaccination 10/16/2020   PPD Test 02/27/2021   Pfizer Covid-19 Vaccine Bivalent Booster 17yrs & up 11/28/2021   Pneumococcal Conjugate-13 02/05/2017   Pneumococcal Polysaccharide-23 02/08/2007   Td 01/28/1991, 06/12/2017   Tdap 06/12/2017   Zoster Recombinat (Shingrix) 07/09/2017, 09/15/2017   Zoster, Live 12/27/2012   Pertinent  Health Maintenance Due  Topic Date Due   INFLUENZA VACCINE  08/28/2022   DEXA SCAN  Completed      09/02/2019   12:05 PM 04/20/2020   10:57 AM 02/15/2021   12:46 PM 02/16/2021    2:41 AM 02/18/2022   10:36 AM  Fall Risk  Falls in the past year? 1 0   1  Was there an injury with Fall? 1    1  Fall Risk Category Calculator 2    3  Fall Risk Category (Retired) Moderate      (RETIRED) Patient Fall Risk Level   Low fall risk High fall risk   Patient at Risk for Falls Due to     History of fall(s);Impaired balance/gait;Impaired mobility  Fall risk Follow up     Education  provided   Functional Status Survey:    Vitals:   05/19/22 1104  BP: 127/70  Pulse: 78  Resp: 18  Temp: (!) 97.3 F (36.3 C)  SpO2: 96%  Weight: 126 lb (57.2 kg)  Height: 4\' 10"  (1.473 m)   Body mass index is 26.33 kg/m. Physical Exam Constitutional:      Appearance: Normal appearance.  HENT:     Head: Normocephalic and atraumatic.     Nose: Nose normal.     Mouth/Throat:     Mouth: Mucous membranes are moist.  Eyes:     Conjunctiva/sclera: Conjunctivae normal.  Cardiovascular:     Rate and Rhythm: Normal rate.     Heart sounds: Murmur heard.  Pulmonary:     Effort: Pulmonary effort is normal.     Breath sounds: Normal breath sounds.  Abdominal:     General: Bowel sounds are normal.     Palpations: Abdomen is soft.  Musculoskeletal:        General: Normal range of motion.     Cervical back: Normal range of motion.      Right lower leg: Edema present.     Left lower leg: Edema present.     Comments: Trace edema of BLE  Skin:    General: Skin is warm and dry.  Neurological:     Mental Status: She is alert. Mental status is at baseline.     Comments: Alert to self and place, disoriented to time  Psychiatric:        Mood and Affect: Mood normal.        Behavior: Behavior normal.     Labs reviewed: Recent Labs    02/10/22 0000 03/20/22 0000  NA 133* 139  K 4.7 4.4  CL 101 106  CO2 23* 23*  BUN 18 12  CREATININE 1.0 1.1  CALCIUM 9.1  --    Recent Labs    02/10/22 0000  AST 21  ALT 13  ALKPHOS 70  ALBUMIN 3.9   Recent Labs    02/10/22 0000 03/20/22 0000  WBC 8.7 6.3  NEUTROABS 6,586.00 3,805.00  HGB 11.7* 12.5  HCT 36 37  PLT 375 397   Lab Results  Component Value Date   TSH 2.25 03/20/2022   Lab Results  Component Value Date   HGBA1C 5.9 (H) 01/31/2020   Lab Results  Component Value Date   CHOL 205 (H) 11/22/2020   HDL 65 11/22/2020   LDLCALC 110 (H) 11/22/2020   TRIG 180 (H) 11/22/2020   CHOLHDL 3.2 11/22/2020    Significant Diagnostic Results in last 30 days:  No results found.  Assessment/Plan  1. Lower extremity edema -  apply TED stockings, on in am and off at bedtime -  elevate legs at night  2. Hypothyroidism, unspecified type Lab Results  Component Value Date   TSH 2.25 03/20/2022   -  stable -  continue Levothyroxine   Family/ staff Communication:  Discussed plan of care with resident and charge nurse.  Labs/tests ordered:   None

## 2022-05-26 DIAGNOSIS — Z6828 Body mass index (BMI) 28.0-28.9, adult: Secondary | ICD-10-CM | POA: Diagnosis not present

## 2022-05-26 DIAGNOSIS — S32000D Wedge compression fracture of unspecified lumbar vertebra, subsequent encounter for fracture with routine healing: Secondary | ICD-10-CM | POA: Diagnosis not present

## 2022-05-26 DIAGNOSIS — S32000A Wedge compression fracture of unspecified lumbar vertebra, initial encounter for closed fracture: Secondary | ICD-10-CM | POA: Diagnosis not present

## 2022-05-27 DIAGNOSIS — R1311 Dysphagia, oral phase: Secondary | ICD-10-CM | POA: Diagnosis not present

## 2022-05-27 DIAGNOSIS — H349 Unspecified retinal vascular occlusion: Secondary | ICD-10-CM | POA: Diagnosis not present

## 2022-05-30 DIAGNOSIS — H35371 Puckering of macula, right eye: Secondary | ICD-10-CM | POA: Diagnosis not present

## 2022-05-30 DIAGNOSIS — H43813 Vitreous degeneration, bilateral: Secondary | ICD-10-CM | POA: Diagnosis not present

## 2022-05-30 DIAGNOSIS — H34811 Central retinal vein occlusion, right eye, with macular edema: Secondary | ICD-10-CM | POA: Diagnosis not present

## 2022-05-30 DIAGNOSIS — Z961 Presence of intraocular lens: Secondary | ICD-10-CM | POA: Diagnosis not present

## 2022-06-05 ENCOUNTER — Emergency Department (HOSPITAL_COMMUNITY): Payer: PPO

## 2022-06-05 ENCOUNTER — Emergency Department (HOSPITAL_COMMUNITY)
Admission: EM | Admit: 2022-06-05 | Discharge: 2022-06-05 | Disposition: A | Discharge status: Home / Self Care | Payer: PPO | Attending: Student | Admitting: Student

## 2022-06-05 ENCOUNTER — Encounter (HOSPITAL_COMMUNITY): Payer: Self-pay

## 2022-06-05 DIAGNOSIS — S0003XA Contusion of scalp, initial encounter: Secondary | ICD-10-CM | POA: Insufficient documentation

## 2022-06-05 DIAGNOSIS — W19XXXA Unspecified fall, initial encounter: Secondary | ICD-10-CM | POA: Insufficient documentation

## 2022-06-05 DIAGNOSIS — Y92009 Unspecified place in unspecified non-institutional (private) residence as the place of occurrence of the external cause: Secondary | ICD-10-CM | POA: Insufficient documentation

## 2022-06-05 DIAGNOSIS — R531 Weakness: Secondary | ICD-10-CM | POA: Diagnosis not present

## 2022-06-05 DIAGNOSIS — S199XXA Unspecified injury of neck, initial encounter: Secondary | ICD-10-CM | POA: Diagnosis not present

## 2022-06-05 DIAGNOSIS — M79605 Pain in left leg: Secondary | ICD-10-CM | POA: Insufficient documentation

## 2022-06-05 DIAGNOSIS — E039 Hypothyroidism, unspecified: Secondary | ICD-10-CM | POA: Diagnosis not present

## 2022-06-05 DIAGNOSIS — M25551 Pain in right hip: Secondary | ICD-10-CM | POA: Diagnosis not present

## 2022-06-05 DIAGNOSIS — Z7982 Long term (current) use of aspirin: Secondary | ICD-10-CM | POA: Diagnosis not present

## 2022-06-05 DIAGNOSIS — N1831 Chronic kidney disease, stage 3a: Secondary | ICD-10-CM | POA: Diagnosis not present

## 2022-06-05 DIAGNOSIS — M25512 Pain in left shoulder: Secondary | ICD-10-CM | POA: Diagnosis not present

## 2022-06-05 DIAGNOSIS — Z043 Encounter for examination and observation following other accident: Secondary | ICD-10-CM | POA: Diagnosis not present

## 2022-06-05 DIAGNOSIS — F0390 Unspecified dementia without behavioral disturbance: Secondary | ICD-10-CM | POA: Insufficient documentation

## 2022-06-05 DIAGNOSIS — S0990XA Unspecified injury of head, initial encounter: Secondary | ICD-10-CM | POA: Diagnosis present

## 2022-06-05 DIAGNOSIS — M25519 Pain in unspecified shoulder: Secondary | ICD-10-CM | POA: Diagnosis not present

## 2022-06-05 LAB — CBC WITH DIFFERENTIAL/PLATELET
Abs Immature Granulocytes: 0.04 10*3/uL (ref 0.00–0.07)
Basophils Absolute: 0 10*3/uL (ref 0.0–0.1)
Basophils Relative: 0 %
Eosinophils Absolute: 0.3 10*3/uL (ref 0.0–0.5)
Eosinophils Relative: 3 %
HCT: 37.9 % (ref 36.0–46.0)
Hemoglobin: 11.8 g/dL — ABNORMAL LOW (ref 12.0–15.0)
Immature Granulocytes: 1 %
Lymphocytes Relative: 24 %
Lymphs Abs: 2 10*3/uL (ref 0.7–4.0)
MCH: 29.1 pg (ref 26.0–34.0)
MCHC: 31.1 g/dL (ref 30.0–36.0)
MCV: 93.6 fL (ref 80.0–100.0)
Monocytes Absolute: 0.5 10*3/uL (ref 0.1–1.0)
Monocytes Relative: 6 %
Neutro Abs: 5.7 10*3/uL (ref 1.7–7.7)
Neutrophils Relative %: 66 %
Platelets: 347 10*3/uL (ref 150–400)
RBC: 4.05 MIL/uL (ref 3.87–5.11)
RDW: 14.5 % (ref 11.5–15.5)
WBC: 8.6 10*3/uL (ref 4.0–10.5)
nRBC: 0 % (ref 0.0–0.2)

## 2022-06-05 LAB — COMPREHENSIVE METABOLIC PANEL
ALT: 15 U/L (ref 0–44)
AST: 22 U/L (ref 15–41)
Albumin: 3.8 g/dL (ref 3.5–5.0)
Alkaline Phosphatase: 69 U/L (ref 38–126)
Anion gap: 11 (ref 5–15)
BUN: 17 mg/dL (ref 8–23)
CO2: 22 mmol/L (ref 22–32)
Calcium: 9.3 mg/dL (ref 8.9–10.3)
Chloride: 106 mmol/L (ref 98–111)
Creatinine, Ser: 0.99 mg/dL (ref 0.44–1.00)
GFR, Estimated: 55 mL/min — ABNORMAL LOW (ref >60)
Glucose, Bld: 118 mg/dL — ABNORMAL HIGH (ref 70–99)
Potassium: 3.8 mmol/L (ref 3.5–5.1)
Sodium: 139 mmol/L (ref 135–145)
Total Bilirubin: 0.5 mg/dL (ref 0.3–1.2)
Total Protein: 7.4 g/dL (ref 6.5–8.1)

## 2022-06-05 MED ORDER — LACTATED RINGERS IV BOLUS
1000.0000 mL | Freq: Once | INTRAVENOUS | Status: AC
  Administered 2022-06-05: 1000 mL via INTRAVENOUS

## 2022-06-05 NOTE — ED Notes (Signed)
Patient transported to X-ray 

## 2022-06-05 NOTE — ED Notes (Signed)
Pt resting in bed with no acute distress noted at this time.  

## 2022-06-05 NOTE — ED Notes (Signed)
Attempted to call report to [facility] with no answer

## 2022-06-05 NOTE — ED Provider Notes (Signed)
[place] EMERGENCY DEPARTMENT AT [facility] Provider Note  CSN: [id number] Arrival date & time: 06/05/22 0347  Chief Complaint(s) Fall  HPI Krista Bowman is a 89 y.o. female with PMH osteoporosis, hypothyroidism, dementia who presents emergency room for evaluation of a fall.  Patient found her laying on the floor with a small hematoma on the left forehead.  Not on blood thinners.  Unsure of loss of consciousness as patient is unable to remember the event.  Endorsing left shoulder and hip pain, left leg pain.  Denies neck pain and patient has full range of motion of the neck.  All history unable to be obtained secondary to patient's dementia.   Past Medical History Past Medical History:  Diagnosis Date   Cerebral atrophy (HCC) 01/21/2013   Hyperlipidemia    Hypothyroid    Osteoarthrosis, unspecified whether generalized or localized, unspecified site    Osteoporosis, unspecified    Other and unspecified hyperlipidemia    Other generalized ischemic cerebrovascular disease 01/21/2013   small vessel disease   Syncope and collapse    Umbilical hernia without mention of obstruction or gangrene    Unspecified hearing loss    Unspecified hypothyroidism    Unspecified vitamin D deficiency    Ventral hernia, unspecified, without mention of obstruction or gangrene    Patient Active Problem List   Diagnosis Date Noted   Fall 04/28/2022   Hyponatremia 02/10/2022   Hearing loss 01/15/2022   Urinary frequency 04/22/2021   Anemia, chronic disease 03/07/2021   Bright red rectal bleeding 03/04/2021   Diarrhea 02/18/2021   Osteoarthritis, multiple sites 02/18/2021   Mild dementia (HCC) 02/18/2021   Dyslipidemia 10/10/2019   Aortic valve stenosis 04/15/2019   Educated about COVID-19 virus infection 04/13/2019   Cardiac murmur 05/21/2018   Weight loss 04/23/2018   OAB (overactive bladder) 09/29/2017   B12 deficiency 09/29/2017   Osteopenia 02/03/2017   Prediabetes 10/28/2016    Stage 3a chronic kidney disease (HCC) 10/28/2016   History of osteoporosis 10/28/2016   Bilateral leg edema 10/28/2016   Leg weakness 05/08/2016   Low back pain 05/24/2015   Constipation 01/13/2013   Right leg pain 12/14/2012   Abnormality of gait 01/29/2012   Edema 10/18/2009   Personal history of fall 06/05/2009   Mixed hyperlipidemia 04/01/2007   Hypothyroidism 12/11/2006   Home Medication(s) Prior to Admission medications   Medication Sig Start Date End Date Taking? Authorizing Provider  acetaminophen (TYLENOL) 500 MG tablet Take 2 tablets (1,000 mg total) by mouth every 6 (six) hours as needed. 05/11/19   Barnetta Chapel, PA-C  alendronate (FOSAMAX) 70 MG tablet Take 70 mg by mouth once a week. related to AGE-RELATED OSTEOPOROSIS WITHOUT CURRENT PATHOLOGICAL FRACTURE (M81.0)    [provider]  aspirin 81 MG tablet Take 81 mg by mouth daily.    [provider]  Calcium Carbonate-Vitamin D 600-200 MG-UNIT TABS Take 1 tablet by mouth 2 (two) times daily.    [provider]  HYDROcodone-acetaminophen (NORCO/VICODIN) 5-325 MG tablet Take 0.5 tablets by mouth 3 (three) times daily. 03/07/22   Medina-Vargas, Monina C, NP  HYDROcodone-acetaminophen (NORCO/VICODIN) 5-325 MG tablet Take 0.5 tablets by mouth 2 (two) times daily. Two listings per [facility] 05/16/22   Medina-Vargas, Monina C, NP  hydrocortisone (PROCTO-MED HC) 2.5 % rectal cream Place 1 Application rectally as needed for hemorrhoids or anal itching. 04/07/22   Ngetich, Dinah C, NP  levothyroxine (SYNTHROID) 100 MCG tablet Take 100 mcg  by mouth daily. 04/19/21   [provider]  lidocaine (ASPERCREME LIDOCAINE) 4 % Place 1 patch onto the skin daily. Apply to SOLE OF RIGHT FOOT    [provider]  Multiple Vitamin (MULTIVITAMIN WITH MINERALS) TABS Take 1 tablet by mouth daily.    [provider]  Omega-3 Fatty Acids (FISH OIL) 1200 MG CAPS Take 1  capsule by mouth daily.    [provider]  polyethylene glycol (MIRALAX / GLYCOLAX) 17 g packet Take 17 g by mouth daily.    [provider]  sennosides-docusate sodium (SENOKOT-S) 8.6-50 MG tablet Take 2 tablets by mouth 2 (two) times daily.    [provider]  Sodium Phosphates (FLEET ENEMA RE) Insert 1 unit rectally as needed for constipation repeat until positive results    [provider]  traMADol (ULTRAM) 50 MG tablet Take 50 mg by mouth every 6 (six) hours as needed.    [provider]                                                                                                                                    Past Surgical History Past Surgical History:  Procedure Laterality Date   CATARACT EXTRACTION W/ INTRAOCULAR LENS  IMPLANT, BILATERAL  2012   Dr. Hazle Quant   HERNIA REPAIR  01/2008   laparoscopic Dr. Ovidio Kin   INGUINAL HERNIA REPAIR N/A 05/10/2019   Procedure: LAPAROSCOPIC FEMORAL HERNIA REPAIR WITH MESH;  Surgeon: Axel Filler, MD;  Location: [facility] OR;  Service: General;  Laterality: N/A;   KNEE SURGERY  2008   Dr. Berneice Heinrich  left   TOE AMPUTATION  1986   little toe right foot  Dr. Cliffton Asters; [place]   Family History Family History  Problem Relation Age of Onset   Melanoma Sister    Cancer Sister     Social History Social History   Tobacco Use   Smoking status: Never   Smokeless tobacco: Never  Vaping Use   Vaping Use: Never used  Substance Use Topics   Alcohol use: No   Drug use: No   Allergies Dust mite extract, Mushroom extract complex, and Penicillins  Review of Systems Review of Systems  Unable to perform ROS: Dementia    Physical Exam Vital Signs  I have reviewed the triage vital signs BP 90/75 (BP Location: Right Arm)   Pulse 80   Temp 98.1 F (36.7 C) (Oral)   Resp (!) 24   SpO2 97%   Physical Exam Vitals and nursing note reviewed.  Constitutional:      General: She is not in acute  distress.    Appearance: She is well-developed.  HENT:     Head: Normocephalic and atraumatic.  Eyes:     Conjunctiva/sclera: Conjunctivae normal.  Cardiovascular:     Rate and Rhythm: Normal rate and regular rhythm.     Heart sounds: No murmur heard. Pulmonary:  Effort: Pulmonary effort is normal. No respiratory distress.     Breath sounds: Normal breath sounds.  Abdominal:     Palpations: Abdomen is soft.     Tenderness: There is no abdominal tenderness.  Musculoskeletal:        General: Tenderness present. No swelling.     Cervical back: Neck supple.  Skin:    General: Skin is warm and dry.     Capillary Refill: Capillary refill takes less than 2 seconds.  Neurological:     Mental Status: She is alert.  Psychiatric:        Mood and Affect: Mood normal.     ED Results and Treatments Labs (all labs ordered are listed, but only abnormal results are displayed) Labs Reviewed  COMPREHENSIVE METABOLIC PANEL  CBC WITH DIFFERENTIAL/PLATELET                                                                                                                          Radiology No results found.  Pertinent labs & imaging results that were available during my care of the patient were reviewed by me and considered in my medical decision making (see MDM for details).  Medications Ordered in ED Medications  lactated ringers bolus 1,000 mL (has no administration in time range)                                                                                                                                     Procedures Procedures  (including critical care time)  Medical Decision Making / ED Course   This patient presents to the ED for concern of fall, this involves an extensive number of treatment options, and is a complaint that carries with it a high risk of complications and morbidity.  The differential diagnosis includes fracture, hematoma, ligamentous injury, contusion, closed  head injury, ICH, SDH  MDM: Patient seen emergency room for evaluation of a fall.  Physical exam with a small scalp hematoma, tenderness over the right hip, tib-fib, shoulder but is otherwise unremarkable with no overt external signs of trauma.  Trauma imaging reassuringly negative including CT head, C-spine and x-rays of the tib-fib, chest, shoulder and pelvis.  Laboratory evaluation unremarkable.  With negative trauma workup, patient safe for discharge back to facility.  Patient then discharged back to facility.   Additional history obtained:  -External records from outside source obtained and reviewed  including: Chart review including previous notes, labs, imaging, consultation notes   Lab Tests: -I ordered, reviewed, and interpreted labs.   The pertinent results include:   Labs Reviewed  COMPREHENSIVE METABOLIC PANEL  CBC WITH DIFFERENTIAL/PLATELET       Imaging Studies ordered: I ordered imaging studies including CT head, C-spine, x-ray tib-fib, chest, shoulder, pelvis I independently visualized and interpreted imaging. I agree with the radiologist interpretation   Medicines ordered and prescription drug management: Meds ordered this encounter  Medications   lactated ringers bolus 1,000 mL    -I have reviewed the patients home medicines and have made adjustments as needed  Critical interventions none   Cardiac Monitoring: The patient was maintained on a cardiac monitor.  I personally viewed and interpreted the cardiac monitored which showed an underlying rhythm of: NSR  Social Determinants of Health:  Factors impacting patients care include: Lives in skilled facility   Reevaluation: After the interventions noted above, I reevaluated the patient and found that they have :improved  Co morbidities that complicate the patient evaluation  Past Medical History:  Diagnosis Date   Cerebral atrophy (HCC) 01/21/2013   Hyperlipidemia    Hypothyroid    Osteoarthrosis,  unspecified whether generalized or localized, unspecified site    Osteoporosis, unspecified    Other and unspecified hyperlipidemia    Other generalized ischemic cerebrovascular disease 01/21/2013   small vessel disease   Syncope and collapse    Umbilical hernia without mention of obstruction or gangrene    Unspecified hearing loss    Unspecified hypothyroidism    Unspecified vitamin D deficiency    Ventral hernia, unspecified, without mention of obstruction or gangrene       Dispostion: I considered admission for this patient, but at this time she does not meet inpatient criteria for admission she is safe for discharge with outpatient follow-up     Final Clinical Impression(s) / ED Diagnoses Final diagnoses:  None     @PCDICTATION @    Glendora Score, MD 06/05/22 1944

## 2022-06-05 NOTE — ED Triage Notes (Signed)
[facility]/o fall that occurred today. Pt has hx of dementia, found laying on the floor. Pt has a small knot on the left side of her head. Pt c/o left side pain, shoulder and hip. Pt is not on blood thinners.

## 2022-06-06 ENCOUNTER — Encounter: Payer: Self-pay | Admitting: Nurse Practitioner

## 2022-06-06 ENCOUNTER — Non-Acute Institutional Stay: Payer: PPO | Admitting: Nurse Practitioner

## 2022-06-06 DIAGNOSIS — R1311 Dysphagia, oral phase: Secondary | ICD-10-CM | POA: Diagnosis not present

## 2022-06-06 DIAGNOSIS — E039 Hypothyroidism, unspecified: Secondary | ICD-10-CM | POA: Diagnosis not present

## 2022-06-06 DIAGNOSIS — S0083XD Contusion of other part of head, subsequent encounter: Secondary | ICD-10-CM | POA: Diagnosis not present

## 2022-06-06 DIAGNOSIS — N1831 Chronic kidney disease, stage 3a: Secondary | ICD-10-CM | POA: Diagnosis not present

## 2022-06-06 DIAGNOSIS — D638 Anemia in other chronic diseases classified elsewhere: Secondary | ICD-10-CM | POA: Diagnosis not present

## 2022-06-06 DIAGNOSIS — G8929 Other chronic pain: Secondary | ICD-10-CM

## 2022-06-06 DIAGNOSIS — K5900 Constipation, unspecified: Secondary | ICD-10-CM

## 2022-06-06 DIAGNOSIS — F03A0 Unspecified dementia, mild, without behavioral disturbance, psychotic disturbance, mood disturbance, and anxiety: Secondary | ICD-10-CM | POA: Diagnosis not present

## 2022-06-06 DIAGNOSIS — R350 Frequency of micturition: Secondary | ICD-10-CM

## 2022-06-06 DIAGNOSIS — Z8739 Personal history of other diseases of the musculoskeletal system and connective tissue: Secondary | ICD-10-CM | POA: Diagnosis not present

## 2022-06-06 DIAGNOSIS — E785 Hyperlipidemia, unspecified: Secondary | ICD-10-CM

## 2022-06-06 DIAGNOSIS — W19XXXD Unspecified fall, subsequent encounter: Secondary | ICD-10-CM

## 2022-06-06 DIAGNOSIS — I350 Nonrheumatic aortic (valve) stenosis: Secondary | ICD-10-CM

## 2022-06-06 DIAGNOSIS — M545 Low back pain, unspecified: Secondary | ICD-10-CM | POA: Diagnosis not present

## 2022-06-06 DIAGNOSIS — S0083XA Contusion of other part of head, initial encounter: Secondary | ICD-10-CM | POA: Insufficient documentation

## 2022-06-06 NOTE — Assessment & Plan Note (Signed)
stable, on Mirlax, Senokot S 

## 2022-06-06 NOTE — Assessment & Plan Note (Addendum)
on Alendronate, Ca, Vit D, 02/21/22 DEXA t score -2.729, Fosamax if HPOA desire.

## 2022-06-06 NOTE — Assessment & Plan Note (Addendum)
Frequent falls, 04/24/22 fall, right elbow skin tear. 05/04/22 fall, no apparent injury. 06/05/22 Fall, ED eval. Multiple factories: gait abnormality, Hx of syncope,  aortic valve stenosis, lack of safety awareness. Close supervision and assistance needed for safety.

## 2022-06-06 NOTE — Assessment & Plan Note (Signed)
mild, CT head 02/15/21 mild trophy of brain,  no behavioral issues, MMSE 24/30 02/18/22

## 2022-06-06 NOTE — Assessment & Plan Note (Signed)
taking Levothyroxine, TSH 0.64 05/30/21 

## 2022-06-06 NOTE — Assessment & Plan Note (Signed)
OAB, not new, up to 3-4x per night, declined urology consultation

## 2022-06-06 NOTE — Assessment & Plan Note (Addendum)
ED eval 06/05/22 for fall, left forehead hematoma. CT head, cervical spine, X-ray chest, left shoulder, pelvis, right tibia/fibula no acute process

## 2022-06-06 NOTE — Assessment & Plan Note (Signed)
LDL 110 11/22/20, taking Omega 3 

## 2022-06-06 NOTE — Assessment & Plan Note (Signed)
Bun/creat 17/0.99 06/05/22

## 2022-06-06 NOTE — Assessment & Plan Note (Signed)
Hx of syncope,  aortic valve stenosis ?

## 2022-06-06 NOTE — Assessment & Plan Note (Signed)
Hgb 11.8 06/05/22  takes ASA 81mg  qd.

## 2022-06-06 NOTE — Assessment & Plan Note (Addendum)
Well controlledlower back pain, on Norco, Tramadol, Tylenol, 02/10/22 T11, L4 compression fx uncertain age.

## 2022-06-06 NOTE — Progress Notes (Addendum)
Location:   [facility] Room Number: 812 Place of Service:  ALF (13) Provider: Arna Snipe Micah Galeno NP  Frederica Kuster, MD  Patient Care Team: Frederica Kuster, MD as PCP - General (Family Medicine) [name] Mariella Blackwelder X, NP as Nurse Practitioner (Nurse Practitioner) Nelson Chimes, MD as Consulting Physician (Ophthalmology) Marcene Corning, MD as Consulting Physician (Orthopedic Surgery) [place], [name], MD as Consulting Physician (Dermatology) Hart Carwin, MD (Inactive) as Consulting Physician (Gastroenterology) Ovidio Kin, MD as Consulting Physician (General Surgery)  Extended Emergency Contact Information Primary Emergency Contact: Jimmey Ralph (POA),Debra          [place], [place] Phone: [phone] Relation: Daughter  Code Status: DNR Goals of care: Advanced Directive information    06/05/2022    4:33 PM  Advanced Directives  Does Patient Have a Medical Advance Directive? Yes  Type of Estate agent of [place];Living will     Chief Complaint  Patient presents with   Acute Visit    F/u ED eval fall, left forehead hematoma    HPI:  Pt is a 89 y.o. female seen today for an acute visit for f/u ED eval 06/05/22 for fall, left forehead hematoma. CT head, cervical spine, X-ray chest, left shoulder, pelvis, right tibia/fibula no acute process  lower back pain, on Norco, Tramadol, Tylenol, 02/10/22 T11, L4 compression fx uncertain age.  OP, on Alendronate, Ca, Vit D Constipation, stable, on Mirlax, Senokot S Hx of syncope,  aortic valve stenosis CKD Bun/creat 17/0.99 06/05/22 Hyperlipidemia, LDL 110 11/22/20, taking Omega 3 Hypothyroidism, taking Levothyroxine, TSH 0.64 05/30/21 Dementia, mild, CT head 02/15/21 mild trophy of brain,  no behavioral issues.              Anemia, Hgb 11.8 06/05/22  takes ASA 81mg  qd.              Urinary frequency, OAB, not new, up to 3-4x per night, declined urology consultation              Past Medical History:  Diagnosis Date   Cerebral atrophy (HCC) 01/21/2013   Hyperlipidemia    Hypothyroid    Osteoarthrosis, unspecified whether generalized or localized, unspecified site    Osteoporosis, unspecified    Other and unspecified hyperlipidemia    Other generalized ischemic cerebrovascular disease 01/21/2013   small vessel disease   Syncope and collapse    Umbilical hernia without mention of obstruction or gangrene    Unspecified hearing loss    Unspecified hypothyroidism    Unspecified vitamin D deficiency    Ventral hernia, unspecified, without mention of obstruction or gangrene    Past Surgical History:  Procedure Laterality Date   CATARACT EXTRACTION W/ INTRAOCULAR LENS  IMPLANT, BILATERAL  2012   Dr. Hazle Quant   HERNIA REPAIR  01/2008   laparoscopic Dr. Ovidio Kin   INGUINAL HERNIA REPAIR N/A 05/10/2019   Procedure: LAPAROSCOPIC FEMORAL HERNIA REPAIR WITH MESH;  Surgeon: Axel Filler, MD;  Location: [facility] OR;  Service: General;  Laterality: N/A;   KNEE SURGERY  2008   Dr. Berneice Heinrich  left   TOE AMPUTATION  1986   little toe right foot  Dr. Cliffton Asters; [place]    Allergies  Allergen Reactions   Dust Mite Extract    Mushroom Extract Complex Other (See Comments)    "Allergic," per paperwork from facility   Penicillins Diarrhea and Other (See Comments)    "Allergic," per paperwork from facility  Allergies as of 06/06/2022       Reactions   Dust Mite Extract    Mushroom Extract Complex Other (See Comments)   "Allergic," per paperwork from facility   Penicillins Diarrhea, Other (See Comments)   "Allergic," per paperwork from facility        Medication List        Accurate as of Jun 06, 2022  3:57 PM. If you have any questions, ask your nurse or doctor.          acetaminophen 500 MG tablet Commonly known as: TYLENOL Take 2 tablets (1,000 mg total) by mouth every 6 (six) hours as needed.   Aspercreme Lidocaine 4 % Generic drug: lidocaine Place  1 patch onto the skin daily. Apply to SOLE OF RIGHT FOOT   aspirin 81 MG tablet Take 81 mg by mouth daily.   Calcium Carbonate-Vitamin D 600-200 MG-UNIT Tabs Take 1 tablet by mouth 2 (two) times daily.   Fish Oil 1200 MG Caps Take 1 capsule by mouth daily.   FLEET ENEMA RE Insert 1 unit rectally as needed for constipation repeat until positive results   Fosamax 70 MG tablet Generic drug: alendronate Take 70 mg by mouth once a week. related to AGE-RELATED OSTEOPOROSIS WITHOUT CURRENT PATHOLOGICAL FRACTURE (M81.0)   HYDROcodone-acetaminophen 5-325 MG tablet Commonly known as: NORCO/VICODIN Take 0.5 tablets by mouth 3 (three) times daily.   HYDROcodone-acetaminophen 5-325 MG tablet Commonly known as: NORCO/VICODIN Take 0.5 tablets by mouth 2 (two) times daily. Two listings per [employer] at [facility]   hydrocortisone 2.5 % rectal cream Commonly known as: Procto-Med HC Place 1 Application rectally as needed for hemorrhoids or anal itching.   levothyroxine 100 MCG tablet Commonly known as: SYNTHROID Take 100 mcg by mouth daily.   multivitamin with minerals Tabs tablet Take 1 tablet by mouth daily.   polyethylene glycol 17 g packet Commonly known as: MIRALAX / GLYCOLAX Take 17 g by mouth daily.   sennosides-docusate sodium 8.6-50 MG tablet Commonly known as: SENOKOT-S Take 2 tablets by mouth 2 (two) times daily.   traMADol 50 MG tablet Commonly known as: ULTRAM Take 50 mg by mouth every 6 (six) hours as needed.        Review of Systems  Constitutional:  Negative for appetite change, fatigue and fever.  HENT:  Positive for hearing loss. Negative for congestion and voice change.   Respiratory:  Negative for cough.   Cardiovascular:  Negative for leg swelling.  Gastrointestinal:  Negative for abdominal pain and constipation.  Genitourinary:  Positive for frequency. Negative for dysuria and urgency.  Musculoskeletal:  Positive for arthralgias, back  pain and gait problem.       Lower back pain, spinal spinous process tenderness palpated, pain worsens with movement.   Skin:  Positive for wound. Negative for color change.  Neurological:  Negative for speech difficulty, weakness and light-headedness.       Memory lapses.   Psychiatric/Behavioral:  Negative for behavioral problems and sleep disturbance. The patient is not nervous/anxious.     Immunization History  Administered Date(s) Administered   Influenza Whole 10/28/2011, 11/10/2012, 10/29/2017   Influenza, High Dose Seasonal PF 11/05/2016, 11/11/2018, 11/12/2020   Influenza-Unspecified 11/14/2013, 10/26/2014, 11/08/2015, 11/09/2019, 11/18/2021   Moderna Sars-Covid-2 Vaccination 01/31/2019, 02/28/2019, 12/06/2019, 06/26/2020   PFIZER(Purple Top)SARS-COV-2 Vaccination 10/16/2020   PPD Test 02/27/2021   Pfizer Covid-19 Vaccine Bivalent Booster 80yrs & up 11/28/2021   Pneumococcal Conjugate-13 02/05/2017   Pneumococcal Polysaccharide-23 02/08/2007  Td 01/28/1991, 06/12/2017   Tdap 06/12/2017   Zoster Recombinat (Shingrix) 07/09/2017, 09/15/2017   Zoster, Live 12/27/2012   Pertinent  Health Maintenance Due  Topic Date Due   INFLUENZA VACCINE  08/28/2022   DEXA SCAN  Completed      09/02/2019   12:05 PM 04/20/2020   10:57 AM 02/15/2021   12:46 PM 02/16/2021    2:41 AM 02/18/2022   10:36 AM  Fall Risk  Falls in the past year? 1 0   1  Was there an injury with Fall? 1    1  Fall Risk Category Calculator 2    3  Fall Risk Category (Retired) Moderate      (RETIRED) Patient Fall Risk Level   Low fall risk High fall risk   Patient at Risk for Falls Due to     History of fall(s);Impaired balance/gait;Impaired mobility  Fall risk Follow up     Education provided   Functional Status Survey:    Vitals:   06/06/22 1427  BP: 124/62  Pulse: 94  Resp: 19  Temp: (!) 97.5 F (36.4 C)  SpO2: 95%   There is no height or weight on file to calculate BMI. Physical  Exam Constitutional:      Appearance: Normal appearance.  HENT:     Head: Normocephalic and atraumatic.     Mouth/Throat:     Mouth: Mucous membranes are moist.  Eyes:     Extraocular Movements: Extraocular movements intact.     Conjunctiva/sclera: Conjunctivae normal.     Pupils: Pupils are equal, round, and reactive to light.  Cardiovascular:     Rate and Rhythm: Normal rate and regular rhythm.     Heart sounds: Murmur heard.  Pulmonary:     Effort: Pulmonary effort is normal.     Breath sounds: Normal breath sounds. No rales.  Abdominal:     General: Bowel sounds are normal.     Palpations: Abdomen is soft.     Tenderness: There is no abdominal tenderness.  Musculoskeletal:        General: Tenderness present.     Cervical back: Normal range of motion and neck supple.     Right lower leg: No edema.     Left lower leg: No edema.     Comments: Lower back pain, positional and with movement spinal spinous process ?L1, L2, tenderness palpated.   Skin:    General: Skin is warm and dry.     Findings: Bruising present.     Comments: Left scalp hematoma.   Neurological:     General: No focal deficit present.     Mental Status: She is alert and oriented to person, place, and time. Mental status is at baseline.     Gait: Gait abnormal.  Psychiatric:        Mood and Affect: Mood normal.        Behavior: Behavior normal.        Thought Content: Thought content normal.     Labs reviewed: Recent Labs    02/10/22 0000 03/20/22 0000 06/05/22 1715  NA 133* 139 139  K 4.7 4.4 3.8  CL 101 106 106  CO2 23* 23* 22  GLUCOSE  --   --  118*  BUN 18 12 17   CREATININE 1.0 1.1 0.99  CALCIUM 9.1  --  9.3   Recent Labs    02/10/22 0000 06/05/22 1715  AST 21 22  ALT 13 15  ALKPHOS 70 69  BILITOT  --  0.5  PROT  --  7.4  ALBUMIN 3.9 3.8   Recent Labs    02/10/22 0000 03/20/22 0000 06/05/22 1715  WBC 8.7 6.3 8.6  NEUTROABS 6,586.00 3,805.00 5.7  HGB 11.7* 12.5 11.8*  HCT  36 37 37.9  MCV  --   --  93.6  PLT 375 397 347   Lab Results  Component Value Date   TSH 2.25 03/20/2022   Lab Results  Component Value Date   HGBA1C 5.9 (H) 01/31/2020   Lab Results  Component Value Date   CHOL 205 (H) 11/22/2020   HDL 65 11/22/2020   LDLCALC 110 (H) 11/22/2020   TRIG 180 (H) 11/22/2020   CHOLHDL 3.2 11/22/2020    Significant Diagnostic Results in last 30 days:  CT Cervical Spine Wo Contrast  Result Date: 06/05/2022 CLINICAL DATA:  Neck trauma (Age >= 65y) EXAM: CT CERVICAL SPINE WITHOUT CONTRAST TECHNIQUE: Multidetector CT imaging of the cervical spine was performed without intravenous contrast. Multiplanar CT image reconstructions were also generated. RADIATION DOSE REDUCTION: This exam was performed according to the departmental dose-optimization program which includes automated exposure control, adjustment of the mA and/or kV according to patient size and/or use of iterative reconstruction technique. COMPARISON:  07/27/2019 FINDINGS: Alignment: Exaggerated cervical lordosis. No traumatic subluxation. Skull base and vertebrae: No acute fracture. Vertebral body heights are maintained. The dens and skull base are intact. Soft tissues and spinal canal: No prevertebral fluid or swelling. No visible canal hematoma. Disc levels: Mild multilevel degenerative disc disease most prominent C6-C7. There is mild multilevel facet hypertrophy. No high-grade canal stenosis. Upper chest: The included esophagus is patulous. Coarse calcified granuloma at the right lung apex. No acute apical findings. Other: Carotid calcifications. IMPRESSION: 1. No acute fracture or subluxation of the cervical spine. 2. Mild multilevel degenerative disc disease and facet hypertrophy. Exaggerated cervical lordosis. Electronically Signed   By: Narda Rutherford M.D.   On: 06/05/2022 18:52   CT HEAD WO CONTRAST ( )  Result Date: 06/05/2022 CLINICAL DATA:  Head trauma, minor (Age >= 65y) Dementia patient  post unwitnessed fall. EXAM: CT HEAD WITHOUT CONTRAST TECHNIQUE: Contiguous axial images were obtained from the base of the skull through the vertex without intravenous contrast. RADIATION DOSE REDUCTION: This exam was performed according to the departmental dose-optimization program which includes automated exposure control, adjustment of the mA and/or kV according to patient size and/or use of iterative reconstruction technique. COMPARISON:  Head CT 02/15/2021 FINDINGS: Brain: No intracranial hemorrhage, mass effect, or midline shift. Normal for age atrophy. No hydrocephalus. The basilar cisterns are patent. No evidence of territorial infarct or acute ischemia. No extra-axial or intracranial fluid collection. Vascular: Atherosclerosis of skullbase vasculature without hyperdense vessel or abnormal calcification. Skull: No fracture or focal lesion. Sinuses/Orbits: Paranasal sinuses and mastoid air cells are clear. The visualized orbits are unremarkable. Other: Small left parietal scalp hematoma. IMPRESSION: Small left parietal scalp hematoma. No acute intracranial abnormality. No skull fracture. Electronically Signed   By: Narda Rutherford M.D.   On: 06/05/2022 18:48   DG Chest Portable 1 View  Result Date: 06/05/2022 CLINICAL DATA:  Fall today, found on floor, dementia EXAM: PORTABLE CHEST 1 VIEW COMPARISON:  07/27/2019 FINDINGS: Mild enlargement of cardiac silhouette. Aortic Atherosclerosis (ICD10-I70.0). Mediastinal contours and pulmonary vascularity normal. Lungs clear. No pulmonary infiltrate, pleural effusion, or pneumothorax. No acute osseous findings. IMPRESSION: No acute abnormalities. Aortic Atherosclerosis (ICD10-I70.0). Electronically Signed   By: Ulyses Southward M.D.   On: 06/05/2022 18:29   DG  Tibia/Fibula Right  Result Date: 06/05/2022 CLINICAL DATA:  Fall today, found on floor, history dementia EXAM: RIGHT TIBIA AND FIBULA - 2 VIEW COMPARISON:  None Available. FINDINGS: Osseous demineralization.  Knee and ankle joint alignments normal. No acute fracture, dislocation, or bone destruction. IMPRESSION: No acute osseous abnormalities. Electronically Signed   By: Ulyses Southward M.D.   On: 06/05/2022 18:28   DG Shoulder Left  Result Date: 06/05/2022 CLINICAL DATA:  Fall today, LEFT side pain EXAM: LEFT SHOULDER - 2+ VIEW COMPARISON:  None available FINDINGS: Osseous demineralization. AC joint alignment normal. Visualized LEFT ribs intact. No acute fracture, dislocation, or bone destruction. Atherosclerotic calcification aorta. IMPRESSION: No acute osseous abnormalities. Aortic Atherosclerosis (ICD10-I70.0). Electronically Signed   By: Ulyses Southward M.D.   On: 06/05/2022 18:28   DG Pelvis Portable  Result Date: 06/05/2022 CLINICAL DATA:  Fall today, history dementia, found lying on floor EXAM: PORTABLE PELVIS 1-2 VIEWS COMPARISON:  None Available. FINDINGS: Osseous demineralization. LEFT hip joint and SI joints preserved. RIGHT hip joint space narrowing with spur formation. No acute fracture, dislocation, or bone destruction. IMPRESSION: Osseous demineralization with degenerative changes RIGHT hip joint. No acute osseous findings. Electronically Signed   By: Ulyses Southward M.D.   On: 06/05/2022 18:26    Assessment/Plan: Traumatic hematoma of forehead ED eval 06/05/22 for fall, left forehead hematoma. CT head, cervical spine, X-ray chest, left shoulder, pelvis, right tibia/fibula no acute process  Fall Frequent falls, 04/24/22 fall, right elbow skin tear. 05/04/22 fall, no apparent injury. 06/05/22 Fall, ED eval. Multiple factories: gait abnormality, Hx of syncope,  aortic valve stenosis, lack of safety awareness. Close supervision and assistance needed for safety.   Low back pain Well controlledlower back pain, on Norco, Tramadol, Tylenol, 02/10/22 T11, L4 compression fx uncertain age.   History of osteoporosis on Alendronate, Ca, Vit D, 02/21/22 DEXA t score -2.729, Fosamax if HPOA desire.    Constipation stable, on Mirlax, Senokot S  Aortic valve stenosis Hx of syncope,  aortic valve stenosis  Stage 3a chronic kidney disease (HCC) Bun/creat 17/0.99 06/05/22  Dyslipidemia LDL 110 11/22/20, taking Omega 3  Hypothyroidism  taking Levothyroxine, TSH 0.64 05/30/21  Mild dementia (HCC)  mild, CT head 02/15/21 mild trophy of brain,  no behavioral issues, MMSE 24/30 02/18/22  Anemia, chronic disease Hgb 11.8 06/05/22  takes ASA 81mg  qd.   Urinary frequency OAB, not new, up to 3-4x per night, declined urology consultation    Family/ staff Communication: plan of care reviewed with the patient and charge nurse.   Labs/tests ordered: none  Time spend 40 minutes.

## 2022-06-10 ENCOUNTER — Encounter: Payer: Self-pay | Admitting: Nurse Practitioner

## 2022-06-10 ENCOUNTER — Non-Acute Institutional Stay: Payer: PPO | Admitting: Nurse Practitioner

## 2022-06-10 DIAGNOSIS — F03A0 Unspecified dementia, mild, without behavioral disturbance, psychotic disturbance, mood disturbance, and anxiety: Secondary | ICD-10-CM

## 2022-06-10 DIAGNOSIS — N1831 Chronic kidney disease, stage 3a: Secondary | ICD-10-CM | POA: Diagnosis not present

## 2022-06-10 DIAGNOSIS — D638 Anemia in other chronic diseases classified elsewhere: Secondary | ICD-10-CM | POA: Diagnosis not present

## 2022-06-10 DIAGNOSIS — K219 Gastro-esophageal reflux disease without esophagitis: Secondary | ICD-10-CM | POA: Insufficient documentation

## 2022-06-10 DIAGNOSIS — M159 Polyosteoarthritis, unspecified: Secondary | ICD-10-CM | POA: Diagnosis not present

## 2022-06-10 DIAGNOSIS — K5900 Constipation, unspecified: Secondary | ICD-10-CM | POA: Diagnosis not present

## 2022-06-10 DIAGNOSIS — E039 Hypothyroidism, unspecified: Secondary | ICD-10-CM

## 2022-06-10 NOTE — Assessment & Plan Note (Signed)
Bun/creat 17/0.99 06/05/22 

## 2022-06-10 NOTE — Assessment & Plan Note (Signed)
reported feeling nauseated, spit up clear phlegm after supper, requested ginger ale, denied any GI symptoms upon my examination.  Adding Omeprazole 20mg  qd, ST to eval

## 2022-06-10 NOTE — Assessment & Plan Note (Signed)
taking Levothyroxine, TSH 0.64 05/30/21 

## 2022-06-10 NOTE — Assessment & Plan Note (Signed)
mild, CT head 02/15/21 mild trophy of brain,  no behavioral issues.  MMSE 24/30 02/18/22

## 2022-06-10 NOTE — Progress Notes (Signed)
Location:  [name] Room Number: NO/812/A Place of Service:  ALF (13) Provider:  Sherronda Sweigert X, NP  Frederica Kuster, MD  Patient Care Team: Frederica Kuster, MD as PCP - General (Family Medicine) [name] Dannielle Baskins X, NP as Nurse Practitioner (Nurse Practitioner) Nelson Chimes, MD as Consulting Physician (Ophthalmology) Marcene Corning, MD as Consulting Physician (Orthopedic Surgery) [place], [name], MD as Consulting Physician (Dermatology) Hart Carwin, MD (Inactive) as Consulting Physician (Gastroenterology) Ovidio Kin, MD as Consulting Physician (General Surgery)  Extended Emergency Contact Information Primary Emergency Contact: Jimmey Ralph (POA),Debra          [place], [place] Phone: [phone] Relation: Daughter  Code Status:  DNR Goals of care: Advanced Directive information    06/05/2022    4:33 PM  Advanced Directives  Does Patient Have a Medical Advance Directive? Yes  Type of Estate agent of [place];Living will     Chief Complaint  Patient presents with   Acute Visit    Patient is being seen for nausea Patient also needs 2nd BP check    HPI:  Pt is a 89 y.o. female seen today for an acute visit for reported feeling nauseated, spit up clear phlegm after supper, requested ginger ale, denied any GI symptoms upon my examination.    ED eval 06/05/22 for fall, left forehead hematoma. CT head, cervical spine, X-ray chest, left shoulder, pelvis, right tibia/fibula no acute process   lower back pain, denied pain today, on Norco, Tramadol, Tylenol, 02/10/22 T11, L4 compression fx uncertain age.   OP, on Alendronate, Ca, Vit D Constipation, stable, on Mirlax, Senokot S Hx of syncope,  aortic valve stenosis CKD Bun/creat 17/0.99 06/05/22 Hyperlipidemia, LDL 110 11/22/20, taking Omega 3 Hypothyroidism, taking Levothyroxine, TSH 0.64 05/30/21 Dementia, mild, CT head 02/15/21 mild trophy  of brain,  no behavioral issues. MMSE 24/30 02/18/22             Anemia, Hgb 11.8 06/05/22  takes ASA 81mg  qd.              Urinary frequency, OAB, not new, up to 3-4x per night, declined urology consultation   Past Medical History:  Diagnosis Date   Cerebral atrophy (HCC) 01/21/2013   Hyperlipidemia    Hypothyroid    Osteoarthrosis, unspecified whether generalized or localized, unspecified site    Osteoporosis, unspecified    Other and unspecified hyperlipidemia    Other generalized ischemic cerebrovascular disease 01/21/2013   small vessel disease   Syncope and collapse    Umbilical hernia without mention of obstruction or gangrene    Unspecified hearing loss    Unspecified hypothyroidism    Unspecified vitamin D deficiency    Ventral hernia, unspecified, without mention of obstruction or gangrene    Past Surgical History:  Procedure Laterality Date   CATARACT EXTRACTION W/ INTRAOCULAR LENS  IMPLANT, BILATERAL  2012   Dr. Hazle Quant   HERNIA REPAIR  01/2008   laparoscopic Dr. Ovidio Kin   INGUINAL HERNIA REPAIR N/A 05/10/2019   Procedure: LAPAROSCOPIC FEMORAL HERNIA REPAIR WITH MESH;  Surgeon: Axel Filler, MD;  Location: [facility] OR;  Service: General;  Laterality: N/A;   KNEE SURGERY  2008   Dr. Berneice Heinrich  left   TOE AMPUTATION  1986   little toe right foot  Dr. Cliffton Asters; [place]    Allergies  Allergen Reactions   Dust Mite Extract    Mushroom Extract Complex Other (See Comments)    "  Allergic," per paperwork from facility   Penicillins Diarrhea and Other (See Comments)    "Allergic," per paperwork from facility    Outpatient Encounter Medications as of 06/10/2022  Medication Sig   acetaminophen (TYLENOL) 500 MG tablet Take 2 tablets (1,000 mg total) by mouth every 6 (six) hours as needed.   alendronate (FOSAMAX) 70 MG tablet Take 70 mg by mouth once a week. related to AGE-RELATED OSTEOPOROSIS WITHOUT CURRENT PATHOLOGICAL FRACTURE (M81.0)   aspirin 81 MG tablet Take 81 mg by  mouth daily.   Calcium Carbonate-Vitamin D 600-200 MG-UNIT TABS Take 1 tablet by mouth 2 (two) times daily.   HYDROcodone-acetaminophen (NORCO/VICODIN) 5-325 MG tablet Take 0.5 tablets by mouth 3 (three) times daily.   HYDROcodone-acetaminophen (NORCO/VICODIN) 5-325 MG tablet Take 0.5 tablets by mouth 2 (two) times daily. Two listings per [employer] at [facility]   hydrocortisone (PROCTO-MED [facility]) 2.5 % rectal cream Place 1 Application rectally as needed for hemorrhoids or anal itching.   levothyroxine (SYNTHROID) 100 MCG tablet Take 100 mcg by mouth daily.   lidocaine (ASPERCREME LIDOCAINE) 4 % Place 1 patch onto the skin daily. Apply to SOLE OF RIGHT FOOT   Multiple Vitamin (MULTIVITAMIN WITH MINERALS) TABS Take 1 tablet by mouth daily.   Omega-3 Fatty Acids (FISH OIL) 1200 MG CAPS Take 1 capsule by mouth daily.   polyethylene glycol (MIRALAX / GLYCOLAX) 17 g packet Take 17 g by mouth daily.   sennosides-docusate sodium (SENOKOT-S) 8.6-50 MG tablet Take 2 tablets by mouth 2 (two) times daily.   Sodium Phosphates (FLEET ENEMA RE) Insert 1 unit rectally as needed for constipation repeat until positive results   traMADol (ULTRAM) 50 MG tablet Take 50 mg by mouth every 6 (six) hours as needed.   No facility-administered encounter medications on file as of 06/10/2022.    Review of Systems  Constitutional:  Negative for appetite change, fatigue and fever.  HENT:  Positive for hearing loss. Negative for congestion and voice change.   Respiratory:  Negative for cough.   Cardiovascular:  Negative for leg swelling.  Gastrointestinal:  Positive for nausea. Negative for abdominal distention, abdominal pain, anal bleeding, blood in stool, constipation, diarrhea and vomiting.  Genitourinary:  Positive for frequency. Negative for dysuria and urgency.  Musculoskeletal:  Positive for arthralgias, back pain and gait problem.       Lower back pain, spinal spinous process tenderness palpated,  pain worsens with movement.   Skin:  Negative for color change.  Neurological:  Negative for speech difficulty, weakness and light-headedness.       Memory lapses.   Psychiatric/Behavioral:  Negative for behavioral problems and sleep disturbance. The patient is not nervous/anxious.     Immunization History  Administered Date(s) Administered   Influenza Whole 10/28/2011, 11/10/2012, 10/29/2017   Influenza, High Dose Seasonal PF 11/05/2016, 11/11/2018, 11/12/2020   Influenza-Unspecified 11/14/2013, 10/26/2014, 11/08/2015, 11/09/2019, 11/18/2021   Moderna Sars-Covid-2 Vaccination 01/31/2019, 02/28/2019, 12/06/2019, 06/26/2020   PFIZER(Purple Top)SARS-COV-2 Vaccination 10/16/2020   PPD Test 02/27/2021   Pfizer Covid-19 Vaccine Bivalent Booster 37yrs & up 11/28/2021   Pneumococcal Conjugate-13 02/05/2017   Pneumococcal Polysaccharide-23 02/08/2007   Td 01/28/1991, 06/12/2017   Tdap 06/12/2017   Zoster Recombinat (Shingrix) 07/09/2017, 09/15/2017   Zoster, Live 12/27/2012   Pertinent  Health Maintenance Due  Topic Date Due   INFLUENZA VACCINE  08/28/2022   DEXA SCAN  Completed      09/02/2019   12:05 PM 04/20/2020   10:57 AM 02/15/2021  12:46 PM 02/16/2021    2:41 AM 02/18/2022   10:36 AM  Fall Risk  Falls in the past year? 1 0   1  Was there an injury with Fall? 1    1  Fall Risk Category Calculator 2    3  Fall Risk Category (Retired) Moderate      (RETIRED) Patient Fall Risk Level   Low fall risk High fall risk   Patient at Risk for Falls Due to     History of fall(s);Impaired balance/gait;Impaired mobility  Fall risk Follow up     Education provided   Functional Status Survey:    Vitals:   06/10/22 1024  BP: (!) 132/54  Pulse: 84  Resp: 18  Temp: (!) 97 F (36.1 C)  SpO2: 95%  Weight: 126 lb 6.4 oz (57.3 kg)  Height: 4\' 10"  (1.473 m)   Body mass index is 26.42 kg/m. Physical Exam Constitutional:      Appearance: Normal appearance.  HENT:     Head:  Normocephalic and atraumatic.     Mouth/Throat:     Mouth: Mucous membranes are moist.  Eyes:     Extraocular Movements: Extraocular movements intact.     Conjunctiva/sclera: Conjunctivae normal.     Pupils: Pupils are equal, round, and reactive to light.  Cardiovascular:     Rate and Rhythm: Normal rate and regular rhythm.     Heart sounds: Murmur heard.  Pulmonary:     Effort: Pulmonary effort is normal.     Breath sounds: Normal breath sounds. No rales.  Abdominal:     General: Bowel sounds are normal. There is no distension.     Palpations: Abdomen is soft.     Tenderness: There is no abdominal tenderness. There is no right CVA tenderness, left CVA tenderness, guarding or rebound.  Musculoskeletal:        General: Tenderness present.     Cervical back: Normal range of motion and neck supple.     Right lower leg: No edema.     Left lower leg: No edema.     Comments: Lower back pain, positional and with movement spinal spinous process ?L1, L2, tenderness palpated.   Skin:    General: Skin is warm and dry.     Findings: Bruising present.     Comments: Left scalp hematoma.   Neurological:     General: No focal deficit present.     Mental Status: She is alert and oriented to person, place, and time. Mental status is at baseline.     Gait: Gait abnormal.  Psychiatric:        Mood and Affect: Mood normal.        Behavior: Behavior normal.        Thought Content: Thought content normal.     Labs reviewed: Recent Labs    02/10/22 0000 03/20/22 0000 06/05/22 1715  NA 133* 139 139  K 4.7 4.4 3.8  CL 101 106 106  CO2 23* 23* 22  GLUCOSE  --   --  118*  BUN 18 12 17   CREATININE 1.0 1.1 0.99  CALCIUM 9.1  --  9.3   Recent Labs    02/10/22 0000 06/05/22 1715  AST 21 22  ALT 13 15  ALKPHOS 70 69  BILITOT  --  0.5  PROT  --  7.4  ALBUMIN 3.9 3.8   Recent Labs    02/10/22 0000 03/20/22 0000 06/05/22 1715  WBC 8.7 6.3 8.6  NEUTROABS  6,586.00 3,805.00 5.7  HGB  11.7* 12.5 11.8*  HCT 36 37 37.9  MCV  --   --  93.6  PLT 375 397 347   Lab Results  Component Value Date   TSH 2.25 03/20/2022   Lab Results  Component Value Date   HGBA1C 5.9 (H) 01/31/2020   Lab Results  Component Value Date   CHOL 205 (H) 11/22/2020   HDL 65 11/22/2020   LDLCALC 110 (H) 11/22/2020   TRIG 180 (H) 11/22/2020   CHOLHDL 3.2 11/22/2020    Significant Diagnostic Results in last 30 days:  CT Cervical Spine Wo Contrast  Result Date: 06/05/2022 CLINICAL DATA:  Neck trauma (Age >= 65y) EXAM: CT CERVICAL SPINE WITHOUT CONTRAST TECHNIQUE: Multidetector CT imaging of the cervical spine was performed without intravenous contrast. Multiplanar CT image reconstructions were also generated. RADIATION DOSE REDUCTION: This exam was performed according to the departmental dose-optimization program which includes automated exposure control, adjustment of the mA and/or kV according to patient size and/or use of iterative reconstruction technique. COMPARISON:  07/27/2019 FINDINGS: Alignment: Exaggerated cervical lordosis. No traumatic subluxation. Skull base and vertebrae: No acute fracture. Vertebral body heights are maintained. The dens and skull base are intact. Soft tissues and spinal canal: No prevertebral fluid or swelling. No visible canal hematoma. Disc levels: Mild multilevel degenerative disc disease most prominent C6-C7. There is mild multilevel facet hypertrophy. No high-grade canal stenosis. Upper chest: The included esophagus is patulous. Coarse calcified granuloma at the right lung apex. No acute apical findings. Other: Carotid calcifications. IMPRESSION: 1. No acute fracture or subluxation of the cervical spine. 2. Mild multilevel degenerative disc disease and facet hypertrophy. Exaggerated cervical lordosis. Electronically Signed   By: Narda Rutherford M.D.   On: 06/05/2022 18:52   CT HEAD WO CONTRAST ( )  Result Date: 06/05/2022 CLINICAL DATA:  Head trauma, minor (Age >=  65y) Dementia patient post unwitnessed fall. EXAM: CT HEAD WITHOUT CONTRAST TECHNIQUE: Contiguous axial images were obtained from the base of the skull through the vertex without intravenous contrast. RADIATION DOSE REDUCTION: This exam was performed according to the departmental dose-optimization program which includes automated exposure control, adjustment of the mA and/or kV according to patient size and/or use of iterative reconstruction technique. COMPARISON:  Head CT 02/15/2021 FINDINGS: Brain: No intracranial hemorrhage, mass effect, or midline shift. Normal for age atrophy. No hydrocephalus. The basilar cisterns are patent. No evidence of territorial infarct or acute ischemia. No extra-axial or intracranial fluid collection. Vascular: Atherosclerosis of skullbase vasculature without hyperdense vessel or abnormal calcification. Skull: No fracture or focal lesion. Sinuses/Orbits: Paranasal sinuses and mastoid air cells are clear. The visualized orbits are unremarkable. Other: Small left parietal scalp hematoma. IMPRESSION: Small left parietal scalp hematoma. No acute intracranial abnormality. No skull fracture. Electronically Signed   By: Narda Rutherford M.D.   On: 06/05/2022 18:48   DG Chest Portable 1 View  Result Date: 06/05/2022 CLINICAL DATA:  Fall today, found on floor, dementia EXAM: PORTABLE CHEST 1 VIEW COMPARISON:  07/27/2019 FINDINGS: Mild enlargement of cardiac silhouette. Aortic Atherosclerosis (ICD10-I70.0). Mediastinal contours and pulmonary vascularity normal. Lungs clear. No pulmonary infiltrate, pleural effusion, or pneumothorax. No acute osseous findings. IMPRESSION: No acute abnormalities. Aortic Atherosclerosis (ICD10-I70.0). Electronically Signed   By: Ulyses Southward M.D.   On: 06/05/2022 18:29   DG Tibia/Fibula Right  Result Date: 06/05/2022 CLINICAL DATA:  Fall today, found on floor, history dementia EXAM: RIGHT TIBIA AND FIBULA - 2 VIEW COMPARISON:  None Available. FINDINGS:  Osseous  demineralization. Knee and ankle joint alignments normal. No acute fracture, dislocation, or bone destruction. IMPRESSION: No acute osseous abnormalities. Electronically Signed   By: Ulyses Southward M.D.   On: 06/05/2022 18:28   DG Shoulder Left  Result Date: 06/05/2022 CLINICAL DATA:  Fall today, LEFT side pain EXAM: LEFT SHOULDER - 2+ VIEW COMPARISON:  None available FINDINGS: Osseous demineralization. AC joint alignment normal. Visualized LEFT ribs intact. No acute fracture, dislocation, or bone destruction. Atherosclerotic calcification aorta. IMPRESSION: No acute osseous abnormalities. Aortic Atherosclerosis (ICD10-I70.0). Electronically Signed   By: Ulyses Southward M.D.   On: 06/05/2022 18:28   DG Pelvis Portable  Result Date: 06/05/2022 CLINICAL DATA:  Fall today, history dementia, found lying on floor EXAM: PORTABLE PELVIS 1-2 VIEWS COMPARISON:  None Available. FINDINGS: Osseous demineralization. LEFT hip joint and SI joints preserved. RIGHT hip joint space narrowing with spur formation. No acute fracture, dislocation, or bone destruction. IMPRESSION: Osseous demineralization with degenerative changes RIGHT hip joint. No acute osseous findings. Electronically Signed   By: Ulyses Southward M.D.   On: 06/05/2022 18:26    Assessment/Plan GERD (gastroesophageal reflux disease) reported feeling nauseated, spit up clear phlegm after supper, requested ginger ale, denied any GI symptoms upon my examination.  Adding Omeprazole 20mg  qd, ST to eval  Osteoarthritis, multiple sites lower back pain, denied pain today, on Norco, Tramadol, Tylenol, 02/10/22 T11, L4 compression fx uncertain age.   Constipation stable, on Mirlax, Senokot S  Stage 3a chronic kidney disease (HCC) Bun/creat 17/0.99 06/05/22  Hypothyroidism  taking Levothyroxine, TSH 0.64 05/30/21  Mild dementia (HCC) mild, CT head 02/15/21 mild trophy of brain,  no behavioral issues.  MMSE 24/30 02/18/22    Family/ staff Communication: plan of  care reviewed with the patient and charge nurse.   Labs/tests ordered: none  Time spend 40 minutes.

## 2022-06-10 NOTE — Assessment & Plan Note (Signed)
stable, on Mirlax, Senokot S 

## 2022-06-10 NOTE — Assessment & Plan Note (Signed)
Hgb 11.8 06/05/22

## 2022-06-10 NOTE — Assessment & Plan Note (Signed)
lower back pain, denied pain today, on Norco, Tramadol, Tylenol, 02/10/22 T11, L4 compression fx uncertain age.

## 2022-06-18 ENCOUNTER — Other Ambulatory Visit: Payer: Self-pay | Admitting: Adult Health

## 2022-06-18 MED ORDER — HYDROCODONE-ACETAMINOPHEN 5-325 MG PO TABS: 0.5000 | ORAL_TABLET | Freq: Two times a day (BID) | ORAL | 0 refills | Status: DC

## 2022-06-27 ENCOUNTER — Non-Acute Institutional Stay: Payer: PPO | Admitting: Family Medicine

## 2022-06-27 DIAGNOSIS — M159 Polyosteoarthritis, unspecified: Secondary | ICD-10-CM | POA: Diagnosis not present

## 2022-06-27 DIAGNOSIS — R269 Unspecified abnormalities of gait and mobility: Secondary | ICD-10-CM | POA: Diagnosis not present

## 2022-06-27 DIAGNOSIS — F03A0 Unspecified dementia, mild, without behavioral disturbance, psychotic disturbance, mood disturbance, and anxiety: Secondary | ICD-10-CM | POA: Diagnosis not present

## 2022-06-27 DIAGNOSIS — E039 Hypothyroidism, unspecified: Secondary | ICD-10-CM | POA: Diagnosis not present

## 2022-06-27 DIAGNOSIS — I350 Nonrheumatic aortic (valve) stenosis: Secondary | ICD-10-CM

## 2022-06-27 NOTE — Progress Notes (Signed)
Provider:  Jacalyn Lefevre, MD Location:      Place of Service:     PCP: Frederica Kuster, MD Patient Care Team: Frederica Kuster, MD as PCP - General (Family Medicine) [name] [name], [name], NP as Nurse Practitioner (Nurse Practitioner) Nelson Chimes, MD as Consulting Physician (Ophthalmology) Marcene Corning, MD as Consulting Physician (Orthopedic Surgery) [place], [name], MD as Consulting Physician (Dermatology) Hart Carwin, MD (Inactive) as Consulting Physician (Gastroenterology) Ovidio Kin, MD as Consulting Physician (General Surgery)  Extended Emergency Contact Information Primary Emergency Contact: Jimmey Ralph (POA),Debra          [place], [place] Mobile Phone: [phone] Relation: Daughter  Code Status:  Goals of Care: Advanced Directive information    06/05/2022    4:33 PM  Advanced Directives  Does Patient Have a Medical Advance Directive? Yes  Type of Estate agent of [place];Living will      No chief complaint on file.   HPI: Patient is a 89 y.o. female seen today for medical management of chronic problems including osteoarthritis, osteoporosis, hearing loss, and cerebral atrophy. Patient's only complaint today is a bruise on her right abdomen.  She does not recall trauma to the area but she did have a fall earlier this month and suffered a hematoma on the left side of her forehead.  So she is having falls.  Memory is fairly good.  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 There is a history of syncope and aortic valve stenosis. She does have issues with her hearing and is followed regularly by hearing consultants. She has history of osteoporosis and continues to take alendronate along with calcium and vitamin D  Past Medical History:  Diagnosis Date   Cerebral atrophy (HCC) 01/21/2013   Hyperlipidemia     Hypothyroid    Osteoarthrosis, unspecified whether generalized or localized, unspecified site    Osteoporosis, unspecified    Other and unspecified hyperlipidemia    Other generalized ischemic cerebrovascular disease 01/21/2013   small vessel disease   Syncope and collapse    Umbilical hernia without mention of obstruction or gangrene    Unspecified hearing loss    Unspecified hypothyroidism    Unspecified vitamin D deficiency    Ventral hernia, unspecified, without mention of obstruction or gangrene    Past Surgical History:  Procedure Laterality Date   CATARACT EXTRACTION W/ INTRAOCULAR LENS  IMPLANT, BILATERAL  2012   Dr. Hazle Quant   HERNIA REPAIR  01/2008   laparoscopic Dr. Ovidio Kin   INGUINAL HERNIA REPAIR N/A 05/10/2019   Procedure: LAPAROSCOPIC FEMORAL HERNIA REPAIR WITH MESH;  Surgeon: Axel Filler, MD;  Location: [facility] OR;  Service: General;  Laterality: N/A;   KNEE SURGERY  2008   Dr. Berneice Heinrich  left   TOE AMPUTATION  1986   little toe right foot  Dr. Cliffton Asters; [facility]    reports that she has never smoked. She has never used smokeless tobacco. She reports that she does not drink alcohol and does not use drugs. Social History   Socioeconomic History   Marital status: Widowed    Spouse name: Not on file   Number of children: Not on file   Years of education: Not on file   Highest education level: Not on file  Occupational History   Occupation: retired Nurse, mental health: RETIRED  Tobacco Use  Smoking status: Never   Smokeless tobacco: Never  Vaping Use   Vaping Use: Never used  Substance and Sexual Activity   Alcohol use: No   Drug use: No   Sexual activity: Never  Other Topics Concern   Not on file  Social History Narrative   [facility] [name] [facility]   Widow   Living Will   Never smoked   No alcohol    Social Determinants of Health   Financial Resource Strain: Low Risk  (01/08/2017)   Overall Financial Resource Strain (CARDIA)     Difficulty of Paying Living Expenses: Not hard at all  Food Insecurity: No Food Insecurity (01/08/2017)   Hunger Vital Sign    Worried About Running Out of Food in the Last Year: Never true    Ran Out of Food in the Last Year: Never true  Transportation Needs: No Transportation Needs (01/08/2017)   PRAPARE - Administrator, Civil Service (Medical): No    Lack of Transportation (Non-Medical): No  Physical Activity: Insufficiently Active (01/08/2017)   Exercise Vital Sign    Days of Exercise per Week: 6 days    Minutes of Exercise per Session: 20 min  Stress: No Stress Concern Present (01/08/2017)   [employer] of Occupational Health - Occupational Stress Questionnaire    Feeling of Stress : Not at all  Social Connections: Somewhat Isolated (01/08/2017)   Social Connection and Isolation Panel [NHANES]    Frequency of Communication with Friends and Family: Twice a week    Frequency of Social Gatherings with Friends and Family: Three times a week    Attends Religious Services: More than 4 times per year    Active Member of Clubs or Organizations: No    Attends Banker Meetings: Never    Marital Status: Widowed  Intimate Partner Violence: Not At Risk (01/08/2017)   Humiliation, Afraid, Rape, and Kick questionnaire    Fear of Current or Ex-Partner: No    Emotionally Abused: No    Physically Abused: No    Sexually Abused: No    Functional Status Survey:    Family History  Problem Relation Age of Onset   Melanoma Sister    Cancer Sister     Health Maintenance  Topic Date Due   COVID-19 Vaccine (7 - 2023-24 season) 01/23/2022   INFLUENZA VACCINE  08/28/2022   Medicare Annual Wellness (AWV)  02/19/2023   DTaP/Tdap/Td (4 - Td or Tdap) 06/13/2027   Pneumonia Vaccine 94+ Years old  Completed   DEXA SCAN  Completed   Zoster Vaccines- Shingrix  Completed   HPV VACCINES  Aged Out    Allergies  Allergen Reactions   Dust Mite Extract    Mushroom  Extract Complex Other (See Comments)    "Allergic," per paperwork from facility   Penicillins Diarrhea and Other (See Comments)    "Allergic," per paperwork from facility    Outpatient Encounter Medications as of 06/27/2022  Medication Sig   acetaminophen (TYLENOL) 500 MG tablet Take 2 tablets (1,000 mg total) by mouth every 6 (six) hours as needed.   alendronate (FOSAMAX) 70 MG tablet Take 70 mg by mouth once a week. related to AGE-RELATED OSTEOPOROSIS WITHOUT CURRENT PATHOLOGICAL FRACTURE (M81.0)   aspirin 81 MG tablet Take 81 mg by mouth daily.   Calcium Carbonate-Vitamin D 600-200 MG-UNIT TABS Take 1 tablet by mouth 2 (two) times daily.   HYDROcodone-acetaminophen (NORCO/VICODIN) 5-325 MG tablet Take 0.5 tablets by mouth 2 (  two) times daily. Two listings per [employer] at [facility]   hydrocortisone (PROCTO-MED [facility]) 2.5 % rectal cream Place 1 Application rectally as needed for hemorrhoids or anal itching.   levothyroxine (SYNTHROID) 100 MCG tablet Take 100 mcg by mouth daily.   lidocaine (ASPERCREME LIDOCAINE) 4 % Place 1 patch onto the skin daily. Apply to SOLE OF RIGHT FOOT   Multiple Vitamin (MULTIVITAMIN WITH MINERALS) TABS Take 1 tablet by mouth daily.   Omega-3 Fatty Acids (FISH OIL) 1200 MG CAPS Take 1 capsule by mouth daily.   polyethylene glycol (MIRALAX / GLYCOLAX) 17 g packet Take 17 g by mouth daily.   sennosides-docusate sodium (SENOKOT-S) 8.6-50 MG tablet Take 2 tablets by mouth 2 (two) times daily.   Sodium Phosphates (FLEET ENEMA RE) Insert 1 unit rectally as needed for constipation repeat until positive results   traMADol (ULTRAM) 50 MG tablet Take 50 mg by mouth every 6 (six) hours as needed.   No facility-administered encounter medications on file as of 06/27/2022.    Review of Systems  Constitutional: Negative.   HENT:  Positive for hearing loss.   Respiratory: Negative.    Cardiovascular: Negative.   Gastrointestinal:  Positive for constipation.   Genitourinary: Negative.   Musculoskeletal:  Positive for back pain and gait problem.  Hematological:  Bruises/bleeds easily.  All other systems reviewed and are negative.   There were no vitals filed for this visit. There is no height or weight on file to calculate BMI. Physical Exam Vitals and nursing note reviewed.  Constitutional:      Appearance: Normal appearance.  Eyes:     Conjunctiva/sclera: Conjunctivae normal.     Pupils: Pupils are equal, round, and reactive to light.  Cardiovascular:     Rate and Rhythm: Normal rate and regular rhythm.     Heart sounds: Murmur heard.  Pulmonary:     Effort: Pulmonary effort is normal.     Breath sounds: Normal breath sounds.  Abdominal:     Comments: Bruising on right side of abdomen.  Nontender to palpation  Neurological:     General: No focal deficit present.     Mental Status: She is alert and oriented to person, place, and time.  Psychiatric:        Mood and Affect: Mood normal.        Behavior: Behavior normal.     Labs reviewed: Basic Metabolic Panel: Recent Labs    02/10/22 0000 03/20/22 0000 06/05/22 1715  NA 133* 139 139  K 4.7 4.4 3.8  CL 101 106 106  CO2 23* 23* 22  GLUCOSE  --   --  118*  BUN 18 12 17   CREATININE 1.0 1.1 0.99  CALCIUM 9.1  --  9.3   Liver Function Tests: Recent Labs    02/10/22 0000 06/05/22 1715  AST 21 22  ALT 13 15  ALKPHOS 70 69  BILITOT  --  0.5  PROT  --  7.4  ALBUMIN 3.9 3.8   No results for input(s): "LIPASE", "AMYLASE" in the last 8760 hours. No results for input(s): "AMMONIA" in the last 8760 hours. CBC: Recent Labs    02/10/22 0000 03/20/22 0000 06/05/22 1715  WBC 8.7 6.3 8.6  NEUTROABS 6,586.00 3,805.00 5.7  HGB 11.7* 12.5 11.8*  HCT 36 37 37.9  MCV  --   --  93.6  PLT 375 397 347   Cardiac Enzymes: No results for input(s): "CKTOTAL", "CKMB", "CKMBINDEX", "TROPONINI" in the last  8760 hours. BNP: Invalid input(s): "POCBNP" Lab Results  Component  Value Date   HGBA1C 5.9 (H) 01/31/2020   Lab Results  Component Value Date   TSH 2.25 03/20/2022   Lab Results  Component Value Date   VITAMINB12 673 08/16/2019   No results found for: "FOLATE" No results found for: "IRON", "TIBC", "FERRITIN"  Imaging and Procedures obtained prior to SNF admission: CT Cervical Spine Wo Contrast  Result Date: 06/05/2022 CLINICAL DATA:  Neck trauma (Age >= 65y) EXAM: CT CERVICAL SPINE WITHOUT CONTRAST TECHNIQUE: Multidetector CT imaging of the cervical spine was performed without intravenous contrast. Multiplanar CT image reconstructions were also generated. RADIATION DOSE REDUCTION: This exam was performed according to the departmental dose-optimization program which includes automated exposure control, adjustment of the mA and/or kV according to patient size and/or use of iterative reconstruction technique. COMPARISON:  07/27/2019 FINDINGS: Alignment: Exaggerated cervical lordosis. No traumatic subluxation. Skull base and vertebrae: No acute fracture. Vertebral body heights are maintained. The dens and skull base are intact. Soft tissues and spinal canal: No prevertebral fluid or swelling. No visible canal hematoma. Disc levels: Mild multilevel degenerative disc disease most prominent C6-C7. There is mild multilevel facet hypertrophy. No high-grade canal stenosis. Upper chest: The included esophagus is patulous. Coarse calcified granuloma at the right lung apex. No acute apical findings. Other: Carotid calcifications. IMPRESSION: 1. No acute fracture or subluxation of the cervical spine. 2. Mild multilevel degenerative disc disease and facet hypertrophy. Exaggerated cervical lordosis. Electronically Signed   By: Narda Rutherford M.D.   On: 06/05/2022 18:52   CT HEAD WO CONTRAST ( )  Result Date: 06/05/2022 CLINICAL DATA:  Head trauma, minor (Age >= 65y) Dementia patient post unwitnessed fall. EXAM: CT HEAD WITHOUT CONTRAST TECHNIQUE: Contiguous axial images were  obtained from the base of the skull through the vertex without intravenous contrast. RADIATION DOSE REDUCTION: This exam was performed according to the departmental dose-optimization program which includes automated exposure control, adjustment of the mA and/or kV according to patient size and/or use of iterative reconstruction technique. COMPARISON:  Head CT 02/15/2021 FINDINGS: Brain: No intracranial hemorrhage, mass effect, or midline shift. Normal for age atrophy. No hydrocephalus. The basilar cisterns are patent. No evidence of territorial infarct or acute ischemia. No extra-axial or intracranial fluid collection. Vascular: Atherosclerosis of skullbase vasculature without hyperdense vessel or abnormal calcification. Skull: No fracture or focal lesion. Sinuses/Orbits: Paranasal sinuses and mastoid air cells are clear. The visualized orbits are unremarkable. Other: Small left parietal scalp hematoma. IMPRESSION: Small left parietal scalp hematoma. No acute intracranial abnormality. No skull fracture. Electronically Signed   By: Narda Rutherford M.D.   On: 06/05/2022 18:48   DG Chest Portable 1 View  Result Date: 06/05/2022 CLINICAL DATA:  Fall today, found on floor, dementia EXAM: PORTABLE CHEST 1 VIEW COMPARISON:  07/27/2019 FINDINGS: Mild enlargement of cardiac silhouette. Aortic Atherosclerosis (ICD10-I70.0). Mediastinal contours and pulmonary vascularity normal. Lungs clear. No pulmonary infiltrate, pleural effusion, or pneumothorax. No acute osseous findings. IMPRESSION: No acute abnormalities. Aortic Atherosclerosis (ICD10-I70.0). Electronically Signed   By: Ulyses Southward M.D.   On: 06/05/2022 18:29   DG Tibia/Fibula Right  Result Date: 06/05/2022 CLINICAL DATA:  Fall today, found on floor, history dementia EXAM: RIGHT TIBIA AND FIBULA - 2 VIEW COMPARISON:  None Available. FINDINGS: Osseous demineralization. Knee and ankle joint alignments normal. No acute fracture, dislocation, or bone destruction.  IMPRESSION: No acute osseous abnormalities. Electronically Signed   By: Ulyses Southward M.D.   On: 06/05/2022 18:28  DG Shoulder Left  Result Date: 06/05/2022 CLINICAL DATA:  Fall today, LEFT side pain EXAM: LEFT SHOULDER - 2+ VIEW COMPARISON:  None available FINDINGS: Osseous demineralization. AC joint alignment normal. Visualized LEFT ribs intact. No acute fracture, dislocation, or bone destruction. Atherosclerotic calcification aorta. IMPRESSION: No acute osseous abnormalities. Aortic Atherosclerosis (ICD10-I70.0). Electronically Signed   By: Ulyses Southward M.D.   On: 06/05/2022 18:28   DG Pelvis Portable  Result Date: 06/05/2022 CLINICAL DATA:  Fall today, history dementia, found lying on floor EXAM: PORTABLE PELVIS 1-2 VIEWS COMPARISON:  None Available. FINDINGS: Osseous demineralization. LEFT hip joint and SI joints preserved. RIGHT hip joint space narrowing with spur formation. No acute fracture, dislocation, or bone destruction. IMPRESSION: Osseous demineralization with degenerative changes RIGHT hip joint. No acute osseous findings. Electronically Signed   By: Ulyses Southward M.D.   On: 06/05/2022 18:26    Assessment/Plan 1. Abnormality of gait Uses walker but has had some falls recently she tells me she can get around her room without walker  2. Aortic valve stenosis, etiology of cardiac valve disease unspecified Has had syncopal episodes in the past denies any shortness of breath chest pain  3. Hypothyroidism, unspecified type Takes 100 mcg of levothyroxine.  TSH done 3 months ago was in the therapeutic range  4. Mild dementia without behavioral disturbance, psychotic disturbance, mood disturbance, or anxiety, unsecified dementia type [facility]) Safety awareness is good.  I believe she is in a good place in assisted living.  Most recent MMSE 24/30  5. Primary osteoarthritis involving multiple joints Has hydrocodone ordered twice daily as well as as needed tramadol     Family/ staff  Communication:   Labs/tests ordered:  Bertram Millard. Hyacinth Meeker, MD [facility] [address] [place], [place] [address] Office [phone]

## 2022-07-08 DIAGNOSIS — H35371 Puckering of macula, right eye: Secondary | ICD-10-CM | POA: Diagnosis not present

## 2022-07-08 DIAGNOSIS — H34811 Central retinal vein occlusion, right eye, with macular edema: Secondary | ICD-10-CM | POA: Diagnosis not present

## 2022-07-08 DIAGNOSIS — Z961 Presence of intraocular lens: Secondary | ICD-10-CM | POA: Diagnosis not present

## 2022-07-08 DIAGNOSIS — H43813 Vitreous degeneration, bilateral: Secondary | ICD-10-CM | POA: Diagnosis not present

## 2022-07-16 ENCOUNTER — Non-Acute Institutional Stay: Payer: PPO | Admitting: Family Medicine

## 2022-07-16 ENCOUNTER — Encounter: Payer: Self-pay | Admitting: Family Medicine

## 2022-07-16 VITALS — BP 128/68 | HR 83 | Temp 98.0°F | Ht <= 58 in | Wt 122.0 lb

## 2022-07-16 DIAGNOSIS — R609 Edema, unspecified: Secondary | ICD-10-CM | POA: Diagnosis not present

## 2022-07-16 DIAGNOSIS — I350 Nonrheumatic aortic (valve) stenosis: Secondary | ICD-10-CM | POA: Diagnosis not present

## 2022-07-16 DIAGNOSIS — R269 Unspecified abnormalities of gait and mobility: Secondary | ICD-10-CM | POA: Diagnosis not present

## 2022-07-16 DIAGNOSIS — K5900 Constipation, unspecified: Secondary | ICD-10-CM | POA: Diagnosis not present

## 2022-07-16 NOTE — Progress Notes (Signed)
Provider:  Jacalyn Lefevre, MD  Careteam: Patient Care Team: Frederica Kuster, MD as PCP - General (Family Medicine) [name], [name], NP as Nurse Practitioner (Nurse Practitioner) Nelson Chimes, MD as Consulting Physician (Ophthalmology) Marcene Corning, MD as Consulting Physician (Orthopedic Surgery) [place], [name], MD as Consulting Physician (Dermatology) Hart Carwin, MD (Inactive) as Consulting Physician (Gastroenterology) Ovidio Kin, MD as Consulting Physician (General Surgery)  PLACE OF SERVICE:  [facility]  Advanced Directive information    Allergies  Allergen Reactions   Dust Mite Extract    Mushroom Extract Complex Other (See Comments)    "Allergic," per paperwork from facility   Penicillins Diarrhea and Other (See Comments)    "Allergic," per paperwork from facility    Chief Complaint  Patient presents with   Acute Visit    Patient presents today for change in medication dose and direction.     HPI: Patient is a 89 y.o. female patient is here today because she feels like she is taking too much stool softener or laxative.  She is scheduled to receive senna as 2/day as well as MiraLAX but she reduced the senna S to 1/day she was not aware she is getting MiraLAX.  Apparently these medicines were added prevent constipation since she takes hydrocodone. She denies any abdominal pain blood in the stools.  Appetite is good but her weight is down about 4 pounds compared to last weight; will bear watching.  Review of Systems:  Review of Systems  Constitutional:  Positive for weight loss.  Respiratory: Negative.    Cardiovascular: Negative.   Gastrointestinal:  Positive for diarrhea.  Psychiatric/Behavioral:  Positive for memory loss.   All other systems reviewed and are negative.   Past Medical History:  Diagnosis Date   Cerebral atrophy (HCC) 01/21/2013   Hyperlipidemia    Hypothyroid    Osteoarthrosis, unspecified whether generalized  or localized, unspecified site    Osteoporosis, unspecified    Other and unspecified hyperlipidemia    Other generalized ischemic cerebrovascular disease 01/21/2013   small vessel disease   Syncope and collapse    Umbilical hernia without mention of obstruction or gangrene    Unspecified hearing loss    Unspecified hypothyroidism    Unspecified vitamin D deficiency    Ventral hernia, unspecified, without mention of obstruction or gangrene    Past Surgical History:  Procedure Laterality Date   CATARACT EXTRACTION W/ INTRAOCULAR LENS  IMPLANT, BILATERAL  2012   Dr. Hazle Quant   HERNIA REPAIR  01/2008   laparoscopic Dr. Ovidio Kin   INGUINAL HERNIA REPAIR N/A 05/10/2019   Procedure: LAPAROSCOPIC FEMORAL HERNIA REPAIR WITH MESH;  Surgeon: Axel Filler, MD;  Location: [facility] OR;  Service: General;  Laterality: N/A;   KNEE SURGERY  2008   Dr. Berneice Heinrich  left   TOE AMPUTATION  1986   little toe right foot  Dr. Cliffton Asters; [place]   Social History:   reports that she has never smoked. She has never used smokeless tobacco. She reports that she does not drink alcohol and does not use drugs.  Family History  Problem Relation Age of Onset   Melanoma Sister    Cancer Sister     Medications: Patient's Medications  New Prescriptions   No medications on file  Previous Medications   ACETAMINOPHEN (TYLENOL) 500 MG TABLET    Take 2 tablets (1,000 mg total) by mouth every 6 (six) hours as needed.   ALENDRONATE (FOSAMAX) 70 MG TABLET  Take 70 mg by mouth once a week. related to AGE-RELATED OSTEOPOROSIS WITHOUT CURRENT PATHOLOGICAL FRACTURE (M81.0)   ASPIRIN 81 MG TABLET    Take 81 mg by mouth daily.   CALCIUM CARBONATE-VITAMIN D 600-200 MG-UNIT TABS    Take 1 tablet by mouth 2 (two) times daily.   HYDROCODONE-ACETAMINOPHEN (NORCO/VICODIN) 5-325 MG TABLET    Take 0.5 tablets by mouth 2 (two) times daily. Two listings per [employer] at [facility]   HYDROCORTISONE (PROCTO-MED [facility]) 2.5 %  RECTAL CREAM    Place 1 Application rectally as needed for hemorrhoids or anal itching.   LEVOTHYROXINE (SYNTHROID) 100 MCG TABLET    Take 100 mcg by mouth daily.   LIDOCAINE (ASPERCREME LIDOCAINE) 4 %    Place 1 patch onto the skin daily. Apply to SOLE OF RIGHT FOOT   MULTIPLE VITAMIN (MULTIVITAMIN WITH MINERALS) TABS    Take 1 tablet by mouth daily.   OMEGA-3 FATTY ACIDS (FISH OIL) 1200 MG CAPS    Take 1 capsule by mouth daily.   POLYETHYLENE GLYCOL (MIRALAX / GLYCOLAX) 17 G PACKET    Take 17 g by mouth daily.   SENNOSIDES-DOCUSATE SODIUM (SENOKOT-S) 8.6-50 MG TABLET    Take 2 tablets by mouth 2 (two) times daily.   SODIUM PHOSPHATES (FLEET ENEMA RE)    Insert 1 unit rectally as needed for constipation repeat until positive results   TRAMADOL (ULTRAM) 50 MG TABLET    Take 50 mg by mouth every 6 (six) hours as needed.  Modified Medications   No medications on file  Discontinued Medications   No medications on file    Physical Exam:  Vitals:   07/16/22 1355  BP: 128/68  Pulse: 83  Temp: 98 F (36.7 C)  SpO2: 97%  Weight: 122 lb (55.3 kg)  Height: 4\' 10"  (1.473 m)   Body mass index is 25.5 kg/m. Wt Readings from Last 3 Encounters:  07/16/22 122 lb (55.3 kg)  06/10/22 126 lb 6.4 oz (57.3 kg)  05/19/22 126 lb (57.2 kg)    Physical Exam Vitals and nursing note reviewed.  Constitutional:      Appearance: Normal appearance.  Cardiovascular:     Rate and Rhythm: Normal rate.     Heart sounds: Murmur heard.  Pulmonary:     Effort: Pulmonary effort is normal.     Breath sounds: Normal breath sounds.  Abdominal:     General: Bowel sounds are normal.     Palpations: Abdomen is soft.  Neurological:     General: No focal deficit present.     Mental Status: She is alert and oriented to person, place, and time.     Labs reviewed: Basic Metabolic Panel: Recent Labs    02/10/22 0000 03/20/22 0000 06/05/22 1715  NA 133* 139 139  K 4.7 4.4 3.8  CL 101 106 106  CO2 23* 23*  22  GLUCOSE  --   --  118*  BUN 18 12 17   CREATININE 1.0 1.1 0.99  CALCIUM 9.1  --  9.3  TSH  --  2.25  --    Liver Function Tests: Recent Labs    02/10/22 0000 06/05/22 1715  AST 21 22  ALT 13 15  ALKPHOS 70 69  BILITOT  --  0.5  PROT  --  7.4  ALBUMIN 3.9 3.8   No results for input(s): "LIPASE", "AMYLASE" in the last 8760 hours. No results for input(s): "AMMONIA" in the last 8760 hours. CBC: Recent  Labs    02/10/22 0000 03/20/22 0000 06/05/22 1715  WBC 8.7 6.3 8.6  NEUTROABS 6,586.00 3,805.00 5.7  HGB 11.7* 12.5 11.8*  HCT 36 37 37.9  MCV  --   --  93.6  PLT 375 397 347   Lipid Panel: No results for input(s): "CHOL", "HDL", "LDLCALC", "TRIG", "CHOLHDL", "LDLDIRECT" in the last 8760 hours. TSH: Recent Labs    03/20/22 0000  TSH 2.25   A1C: Lab Results  Component Value Date   HGBA1C 5.9 (H) 01/31/2020     Assessment/Plan  1. Constipation, unspecified constipation type Will reduce senna S to 1/day and hold MiraLAX until needed   2. Abnormality of gait Safety awareness is good uses her walker everywhere she goes  3. Aortic valve stenosis, etiology of cardiac valve disease unspecified Murmur unchanged.  Continue to follow  4. Edema, unspecified type No edema present today  Jacalyn Lefevre, MD [facility] [phone]

## 2022-08-15 ENCOUNTER — Non-Acute Institutional Stay: Payer: PPO | Admitting: Nurse Practitioner

## 2022-08-15 ENCOUNTER — Encounter: Payer: Self-pay | Admitting: Nurse Practitioner

## 2022-08-15 DIAGNOSIS — F03A0 Unspecified dementia, mild, without behavioral disturbance, psychotic disturbance, mood disturbance, and anxiety: Secondary | ICD-10-CM | POA: Diagnosis not present

## 2022-08-15 DIAGNOSIS — M159 Polyosteoarthritis, unspecified: Secondary | ICD-10-CM

## 2022-08-15 DIAGNOSIS — K5900 Constipation, unspecified: Secondary | ICD-10-CM

## 2022-08-15 DIAGNOSIS — R269 Unspecified abnormalities of gait and mobility: Secondary | ICD-10-CM | POA: Diagnosis not present

## 2022-08-15 DIAGNOSIS — E039 Hypothyroidism, unspecified: Secondary | ICD-10-CM | POA: Diagnosis not present

## 2022-08-15 NOTE — Assessment & Plan Note (Signed)
stable, on Mirlax, Senokot S

## 2022-08-15 NOTE — Assessment & Plan Note (Signed)
taking Levothyroxine, TSH 0.64 05/30/21 

## 2022-08-15 NOTE — Assessment & Plan Note (Signed)
ambulates with walker, risk of falling.

## 2022-08-15 NOTE — Assessment & Plan Note (Addendum)
Hx of back pain, knee pain, chronic pain, takes Norco bid, c/o R shoulder pain.  c/o R shoulder pain x1 day, on and off, top of the right should pain was reproduced by pressure,  no decreased ROM or ROM with pain, chronic pain has been treated with Norco bid. Will apply Lidoderm patch for now, may X-ray if no better.

## 2022-08-15 NOTE — Progress Notes (Unsigned)
Location:   [facility] Room Number: 812 Place of Service:  ALF (13) Provider: Arna Snipe Kiernan Farkas NP  Frederica Kuster, MD  Patient Care Team: Frederica Kuster, MD as PCP - General (Family Medicine) [name] Sayeed Weatherall X, NP as Nurse Practitioner (Nurse Practitioner) Nelson Chimes, MD as Consulting Physician (Ophthalmology) Marcene Corning, MD as Consulting Physician (Orthopedic Surgery) [place], [name], MD as Consulting Physician (Dermatology) Hart Carwin, MD (Inactive) as Consulting Physician (Gastroenterology) Ovidio Kin, MD as Consulting Physician (General Surgery)  Extended Emergency Contact Information Primary Emergency Contact: Jimmey Ralph (POA),Debra          [place], [place] Phone: [phone] Relation: Daughter  Code Status: DNR Goals of care: Advanced Directive information    06/05/2022    4:33 PM  Advanced Directives  Does Patient Have a Medical Advance Directive? Yes  Type of Estate agent of [place];Living will     Chief Complaint  Patient presents with   Acute Visit    R shoulder pain.     HPI:  Pt is a 89 y.o. female seen today for an acute visit for c/o R shoulder pain x1 day, on and off, top of the right should pain was reproduced by pressure,  no decreased ROM or ROM with pain, chronic pain has been treated with Norco bid.   ED eval 06/05/22 for fall, left forehead hematoma. CT head, cervical spine, X-ray chest, left shoulder, pelvis, right tibia/fibula no acute process   lower back pain, denied pain today, on Norco, Tramadol, Tylenol, 02/10/22 T11, L4 compression fx uncertain age.    OP, on Alendronate, Ca, Vit D Constipation, stable, on Mirlax, Senokot S Hx of syncope,  aortic valve stenosis CKD Bun/creat 17/0.99 06/05/22 Hyperlipidemia, LDL 110 11/22/20, taking Omega 3 Hypothyroidism, taking Levothyroxine, TSH 0.64 05/30/21 Dementia, mild, CT head 02/15/21 mild trophy of brain,  no  behavioral issues. MMSE 24/30 02/18/22             Anemia, Hgb 11.8 06/05/22  takes ASA 81mg  qd.              Urinary frequency, OAB, not new, up to 3-4x per night, declined urology consultation  Gait abnormality, ambulates with walker, risk of falling.       Past Medical History:  Diagnosis Date   Cerebral atrophy (HCC) 01/21/2013   Hyperlipidemia    Hypothyroid    Osteoarthrosis, unspecified whether generalized or localized, unspecified site    Osteoporosis, unspecified    Other and unspecified hyperlipidemia    Other generalized ischemic cerebrovascular disease 01/21/2013   small vessel disease   Syncope and collapse    Umbilical hernia without mention of obstruction or gangrene    Unspecified hearing loss    Unspecified hypothyroidism    Unspecified vitamin D deficiency    Ventral hernia, unspecified, without mention of obstruction or gangrene    Past Surgical History:  Procedure Laterality Date   CATARACT EXTRACTION W/ INTRAOCULAR LENS  IMPLANT, BILATERAL  2012   Dr. Hazle Quant   HERNIA REPAIR  01/2008   laparoscopic Dr. Ovidio Kin   INGUINAL HERNIA REPAIR N/A 05/10/2019   Procedure: LAPAROSCOPIC FEMORAL HERNIA REPAIR WITH MESH;  Surgeon: Axel Filler, MD;  Location: [facility] OR;  Service: General;  Laterality: N/A;   KNEE SURGERY  2008   Dr. Berneice Heinrich  left   TOE AMPUTATION  1986   little toe right foot  Dr. Cliffton Asters; [place]    Allergies  Allergen Reactions   Dust Mite Extract    Mushroom Extract Complex Other (See Comments)    "Allergic," per paperwork from facility   Penicillins Diarrhea and Other (See Comments)    "Allergic," per paperwork from facility    Allergies as of 08/15/2022       Reactions   Dust Mite Extract    Mushroom Extract Complex Other (See Comments)   "Allergic," per paperwork from facility   Penicillins Diarrhea, Other (See Comments)   "Allergic," per paperwork from facility        Medication List        Accurate as of August 15, 2022 11:59  PM. If you have any questions, ask your nurse or doctor.          acetaminophen 500 MG tablet Commonly known as: TYLENOL Take 2 tablets (1,000 mg total) by mouth every 6 (six) hours as needed.   Aspercreme Lidocaine 4 % Generic drug: lidocaine Place 1 patch onto the skin daily. Apply to SOLE OF RIGHT FOOT   aspirin 81 MG tablet Take 81 mg by mouth daily.   Calcium Carbonate-Vitamin D 600-200 MG-UNIT Tabs Take 1 tablet by mouth 2 (two) times daily.   Fish Oil 1200 MG Caps Take 1 capsule by mouth daily.   FLEET ENEMA RE Insert 1 unit rectally as needed for constipation repeat until positive results   Fosamax 70 MG tablet Generic drug: alendronate Take 70 mg by mouth once a week. related to AGE-RELATED OSTEOPOROSIS WITHOUT CURRENT PATHOLOGICAL FRACTURE (M81.0)   HYDROcodone-acetaminophen 5-325 MG tablet Commonly known as: NORCO/VICODIN Take 0.5 tablets by mouth 2 (two) times daily. Two listings per [employer] at [facility]   hydrocortisone 2.5 % rectal cream Commonly known as: Procto-Med HC Place 1 Application rectally as needed for hemorrhoids or anal itching.   levothyroxine 100 MCG tablet Commonly known as: SYNTHROID Take 100 mcg by mouth daily.   multivitamin with minerals Tabs tablet Take 1 tablet by mouth daily.   polyethylene glycol 17 g packet Commonly known as: MIRALAX / GLYCOLAX Take 17 g by mouth daily.   sennosides-docusate sodium 8.6-50 MG tablet Commonly known as: SENOKOT-S Take 2 tablets by mouth 2 (two) times daily.   traMADol 50 MG tablet Commonly known as: ULTRAM Take 50 mg by mouth every 6 (six) hours as needed.        Review of Systems  Constitutional:  Negative for appetite change, fatigue and fever.  HENT:  Positive for hearing loss. Negative for congestion and voice change.   Respiratory:  Negative for cough.   Cardiovascular:  Negative for leg swelling.  Gastrointestinal:  Negative for abdominal pain.   Genitourinary:  Positive for frequency. Negative for dysuria and urgency.  Musculoskeletal:  Positive for arthralgias, back pain and gait problem.       Lower back pain, spinal spinous process tenderness palpated, pain worsens with movement.   Skin:  Negative for color change.  Neurological:  Negative for speech difficulty, weakness and light-headedness.       Memory lapses.   Psychiatric/Behavioral:  Negative for behavioral problems and sleep disturbance. The patient is not nervous/anxious.     Immunization History  Administered Date(s) Administered   Influenza Whole 10/28/2011, 11/10/2012, 10/29/2017   Influenza, High Dose Seasonal PF 11/05/2016, 11/11/2018, 11/12/2020   Influenza-Unspecified 11/14/2013, 10/26/2014, 11/08/2015, 11/09/2019, 11/18/2021   Moderna Sars-Covid-2 Vaccination 01/31/2019, 02/28/2019, 12/06/2019, 06/26/2020   PFIZER(Purple Top)SARS-COV-2 Vaccination 10/16/2020   PPD Test 02/27/2021   Pfizer Covid-19  Vaccine Bivalent Booster 52yrs & up 11/28/2021   Pneumococcal Conjugate-13 02/05/2017   Pneumococcal Polysaccharide-23 02/08/2007   Td 01/28/1991, 06/12/2017   Tdap 06/12/2017   Zoster Recombinant(Shingrix) 07/09/2017, 09/15/2017   Zoster, Live 12/27/2012   Pertinent  Health Maintenance Due  Topic Date Due   INFLUENZA VACCINE  08/28/2022   DEXA SCAN  Completed      09/02/2019   12:05 PM 04/20/2020   10:57 AM 02/15/2021   12:46 PM 02/16/2021    2:41 AM 02/18/2022   10:36 AM  Fall Risk  Falls in the past year? 1 0   1  Was there an injury with Fall? 1    1  Fall Risk Category Calculator 2    3  Fall Risk Category (Retired) Moderate      (RETIRED) Patient Fall Risk Level   Low fall risk High fall risk   Patient at Risk for Falls Due to     History of fall(s);Impaired balance/gait;Impaired mobility  Fall risk Follow up     Education provided   Functional Status Survey:    Vitals:   08/15/22 1452  BP: (!) 141/51  Pulse: (!) 59  Resp: 18  Temp: 97.8 F  (36.6 C)  SpO2: 95%  Weight: 119 lb 3.2 oz (54.1 kg)   Body mass index is 24.91 kg/m. Physical Exam Constitutional:      Appearance: Normal appearance.  HENT:     Head: Normocephalic and atraumatic.     Mouth/Throat:     Mouth: Mucous membranes are moist.  Eyes:     Extraocular Movements: Extraocular movements intact.     Conjunctiva/sclera: Conjunctivae normal.     Pupils: Pupils are equal, round, and reactive to light.  Cardiovascular:     Rate and Rhythm: Normal rate and regular rhythm.     Heart sounds: Murmur heard.  Pulmonary:     Effort: Pulmonary effort is normal.     Breath sounds: Normal breath sounds. No rales.  Abdominal:     General: Bowel sounds are normal. There is no distension.     Palpations: Abdomen is soft.     Tenderness: There is no abdominal tenderness. There is no right CVA tenderness, left CVA tenderness, guarding or rebound.  Musculoskeletal:        General: Swelling, tenderness, deformity and signs of injury present.     Cervical back: Normal range of motion and neck supple.     Right lower leg: No edema.     Left lower leg: No edema.     Comments: Lower back pain, positional and with movement spinal spinous process ?L1, L2, tenderness palpated.  R shoulder pain.   Skin:    General: Skin is warm and dry.     Findings: Bruising present.     Comments: Left scalp hematoma.   Neurological:     General: No focal deficit present.     Mental Status: She is alert and oriented to person, place, and time. Mental status is at baseline.     Gait: Gait abnormal.  Psychiatric:        Mood and Affect: Mood normal.        Behavior: Behavior normal.        Thought Content: Thought content normal.     Labs reviewed: Recent Labs    02/10/22 0000 03/20/22 0000 06/05/22 1715  NA 133* 139 139  K 4.7 4.4 3.8  CL 101 106 106  CO2 23* 23* 22  GLUCOSE  --   --  118*  BUN 18 12 17   CREATININE 1.0 1.1 0.99  CALCIUM 9.1  --  9.3   Recent Labs     02/10/22 0000 06/05/22 1715  AST 21 22  ALT 13 15  ALKPHOS 70 69  BILITOT  --  0.5  PROT  --  7.4  ALBUMIN 3.9 3.8   Recent Labs    02/10/22 0000 03/20/22 0000 06/05/22 1715  WBC 8.7 6.3 8.6  NEUTROABS 6,586.00 3,805.00 5.7  HGB 11.7* 12.5 11.8*  HCT 36 37 37.9  MCV  --   --  93.6  PLT 375 397 347   Lab Results  Component Value Date   TSH 2.25 03/20/2022   Lab Results  Component Value Date   HGBA1C 5.9 (H) 01/31/2020   Lab Results  Component Value Date   CHOL 205 (H) 11/22/2020   HDL 65 11/22/2020   LDLCALC 110 (H) 11/22/2020   TRIG 180 (H) 11/22/2020   CHOLHDL 3.2 11/22/2020    Significant Diagnostic Results in last 30 days:  No results found.  Assessment/Plan: Osteoarthritis, multiple sites Hx of back pain, knee pain, chronic pain, takes Norco bid, c/o R shoulder pain.  c/o R shoulder pain x1 day, on and off, top of the right should pain was reproduced by pressure,  no decreased ROM or ROM with pain, chronic pain has been treated with Norco bid. Will apply Lidoderm patch for now, may X-ray if no better.   Constipation stable, on Mirlax, Senokot S  Hypothyroidism  taking Levothyroxine, TSH 0.64 05/30/21  Mild dementia (HCC)  mild, CT head 02/15/21 mild trophy of brain,  no behavioral issues. MMSE 24/30 02/18/22  Abnormality of gait ambulates with walker, risk of falling.     Family/ staff Communication: plan of care reviewed with the patient and charge nurse.   Labs/tests ordered:  none  Time spend 40 minutes.

## 2022-08-15 NOTE — Assessment & Plan Note (Signed)
mild, CT head 02/15/21 mild trophy of brain,  no behavioral issues.  MMSE 24/30 02/18/22

## 2022-09-05 ENCOUNTER — Encounter: Payer: Self-pay | Admitting: Family Medicine

## 2022-09-08 ENCOUNTER — Encounter: Payer: Self-pay | Admitting: Family Medicine

## 2022-09-09 ENCOUNTER — Encounter: Payer: Self-pay | Admitting: Family Medicine

## 2022-09-10 ENCOUNTER — Encounter: Payer: Self-pay | Admitting: Adult Health

## 2022-09-10 ENCOUNTER — Other Ambulatory Visit: Payer: Self-pay

## 2022-09-10 ENCOUNTER — Non-Acute Institutional Stay: Payer: PPO | Admitting: Adult Health

## 2022-09-10 ENCOUNTER — Emergency Department (HOSPITAL_COMMUNITY): Payer: PPO

## 2022-09-10 ENCOUNTER — Emergency Department (HOSPITAL_COMMUNITY)
Admission: EM | Admit: 2022-09-10 | Discharge: 2022-09-10 | Payer: PPO | Attending: Emergency Medicine | Admitting: Emergency Medicine

## 2022-09-10 DIAGNOSIS — M810 Age-related osteoporosis without current pathological fracture: Secondary | ICD-10-CM

## 2022-09-10 DIAGNOSIS — M542 Cervicalgia: Secondary | ICD-10-CM | POA: Diagnosis not present

## 2022-09-10 DIAGNOSIS — R451 Restlessness and agitation: Secondary | ICD-10-CM

## 2022-09-10 DIAGNOSIS — E039 Hypothyroidism, unspecified: Secondary | ICD-10-CM | POA: Diagnosis not present

## 2022-09-10 DIAGNOSIS — I959 Hypotension, unspecified: Secondary | ICD-10-CM | POA: Diagnosis not present

## 2022-09-10 DIAGNOSIS — W19XXXA Unspecified fall, initial encounter: Secondary | ICD-10-CM | POA: Diagnosis not present

## 2022-09-10 DIAGNOSIS — M79605 Pain in left leg: Secondary | ICD-10-CM | POA: Diagnosis not present

## 2022-09-10 DIAGNOSIS — M25551 Pain in right hip: Secondary | ICD-10-CM

## 2022-09-10 DIAGNOSIS — F0390 Unspecified dementia without behavioral disturbance: Secondary | ICD-10-CM | POA: Diagnosis not present

## 2022-09-10 DIAGNOSIS — M1611 Unilateral primary osteoarthritis, right hip: Secondary | ICD-10-CM | POA: Diagnosis not present

## 2022-09-10 DIAGNOSIS — Y92009 Unspecified place in unspecified non-institutional (private) residence as the place of occurrence of the external cause: Secondary | ICD-10-CM | POA: Diagnosis not present

## 2022-09-10 DIAGNOSIS — M25572 Pain in left ankle and joints of left foot: Secondary | ICD-10-CM | POA: Diagnosis not present

## 2022-09-10 DIAGNOSIS — M79604 Pain in right leg: Secondary | ICD-10-CM | POA: Diagnosis not present

## 2022-09-10 DIAGNOSIS — Z7982 Long term (current) use of aspirin: Secondary | ICD-10-CM | POA: Diagnosis not present

## 2022-09-10 DIAGNOSIS — Z79899 Other long term (current) drug therapy: Secondary | ICD-10-CM | POA: Diagnosis not present

## 2022-09-10 DIAGNOSIS — M25561 Pain in right knee: Secondary | ICD-10-CM | POA: Diagnosis not present

## 2022-09-10 DIAGNOSIS — S0990XA Unspecified injury of head, initial encounter: Secondary | ICD-10-CM | POA: Diagnosis not present

## 2022-09-10 DIAGNOSIS — M85872 Other specified disorders of bone density and structure, left ankle and foot: Secondary | ICD-10-CM | POA: Diagnosis not present

## 2022-09-10 DIAGNOSIS — M47816 Spondylosis without myelopathy or radiculopathy, lumbar region: Secondary | ICD-10-CM | POA: Diagnosis not present

## 2022-09-10 LAB — CBC WITH DIFFERENTIAL/PLATELET
Abs Immature Granulocytes: 0.03 10*3/uL (ref 0.00–0.07)
Basophils Absolute: 0 10*3/uL (ref 0.0–0.1)
Basophils Relative: 0 %
Eosinophils Absolute: 0.2 10*3/uL (ref 0.0–0.5)
Eosinophils Relative: 2 %
HCT: 36.2 % (ref 36.0–46.0)
Hemoglobin: 11.4 g/dL — ABNORMAL LOW (ref 12.0–15.0)
Immature Granulocytes: 0 %
Lymphocytes Relative: 22 %
Lymphs Abs: 2.1 10*3/uL (ref 0.7–4.0)
MCH: 29.2 pg (ref 26.0–34.0)
MCHC: 31.5 g/dL (ref 30.0–36.0)
MCV: 92.6 fL (ref 80.0–100.0)
Monocytes Absolute: 0.6 10*3/uL (ref 0.1–1.0)
Monocytes Relative: 6 %
Neutro Abs: 6.5 10*3/uL (ref 1.7–7.7)
Neutrophils Relative %: 70 %
Platelets: 347 10*3/uL (ref 150–400)
RBC: 3.91 MIL/uL (ref 3.87–5.11)
RDW: 14.5 % (ref 11.5–15.5)
WBC: 9.4 10*3/uL (ref 4.0–10.5)
nRBC: 0 % (ref 0.0–0.2)

## 2022-09-10 LAB — COMPREHENSIVE METABOLIC PANEL
ALT: 16 U/L (ref 0–44)
AST: 21 U/L (ref 15–41)
Albumin: 3.9 g/dL (ref 3.5–5.0)
Alkaline Phosphatase: 65 U/L (ref 38–126)
Anion gap: 9 (ref 5–15)
BUN: 26 mg/dL — ABNORMAL HIGH (ref 8–23)
CO2: 26 mmol/L (ref 22–32)
Calcium: 9.4 mg/dL (ref 8.9–10.3)
Chloride: 103 mmol/L (ref 98–111)
Creatinine, Ser: 1.09 mg/dL — ABNORMAL HIGH (ref 0.44–1.00)
GFR, Estimated: 49 mL/min — ABNORMAL LOW (ref >60)
Glucose, Bld: 107 mg/dL — ABNORMAL HIGH (ref 70–99)
Potassium: 3.4 mmol/L — ABNORMAL LOW (ref 3.5–5.1)
Sodium: 138 mmol/L (ref 135–145)
Total Bilirubin: 0.3 mg/dL (ref 0.3–1.2)
Total Protein: 7.5 g/dL (ref 6.5–8.1)

## 2022-09-10 LAB — URINALYSIS, W/ REFLEX TO CULTURE (INFECTION SUSPECTED)
Bacteria, UA: NONE SEEN
Bilirubin Urine: NEGATIVE
Glucose, UA: NEGATIVE mg/dL
Ketones, ur: NEGATIVE mg/dL
Leukocytes,Ua: NEGATIVE
Nitrite: NEGATIVE
Protein, ur: NEGATIVE mg/dL
Specific Gravity, Urine: 1.003 — ABNORMAL LOW (ref 1.005–1.030)
pH: 6 (ref 5.0–8.0)

## 2022-09-10 MED ORDER — HYDROCODONE-ACETAMINOPHEN 5-325 MG PO TABS
0.5000 | ORAL_TABLET | Freq: Once | ORAL | Status: AC
  Administered 2022-09-10: 0.5 via ORAL
  Filled 2022-09-10: qty 1

## 2022-09-10 NOTE — ED Provider Notes (Signed)
[place] EMERGENCY DEPARTMENT AT [facility] Provider Note   CSN: [id number] Arrival date & time: 09/10/22  1513     History  Chief Complaint  Patient presents with   Krista Bowman    Krista Bowman is a 89 y.o. female.  Patient is an 89 year old female with a history of hypothyroidism, hyperlipidemia, dementia, recurrent falls, chronic pain who lives at friend's home [place] who is being brought in today after an unwitnessed fall.  When asking the patient what happened she reports she fell but cannot remember why she fell.  She is denying any neck pain or headache but is unsure if she hit her head.  She is complaining of pain in her right hip and her left ankle.  She denies any chest pain, abdominal pain or upper extremity pain.  Patient does take any 1 mg aspirin but no other anticoagulants.  She denies any nausea, shortness of breath.  The history is provided by the patient, the EMS personnel and the nursing home.  Fall       Home Medications Prior to Admission medications   Medication Sig Start Date End Date Taking? Authorizing Provider  acetaminophen (TYLENOL) 500 MG tablet Take 2 tablets (1,000 mg total) by mouth every 6 (six) hours as needed. 05/11/19   Barnetta Chapel, PA-C  alendronate (FOSAMAX) 70 MG tablet Take 70 mg by mouth once a week. related to AGE-RELATED OSTEOPOROSIS WITHOUT CURRENT PATHOLOGICAL FRACTURE (M81.0)    [provider]  aspirin 81 MG tablet Take 81 mg by mouth daily.    [provider]  Calcium Carbonate-Vitamin D 600-200 MG-UNIT TABS Take 1 tablet by mouth 2 (two) times daily.    [provider]  HYDROcodone-acetaminophen (NORCO/VICODIN) 5-325 MG tablet Take 0.5 tablets by mouth 2 (two) times daily. Two listings per [facility] at [facility] 06/18/22   Medina-Vargas, Monina C, NP  hydrocortisone (PROCTO-MED HC) 2.5 % rectal cream Place 1 Application rectally as needed for hemorrhoids or anal itching. 04/07/22    Ngetich, Dinah C, NP  levothyroxine (SYNTHROID) 100 MCG tablet Take 100 mcg by mouth daily. 04/19/21   [provider]  lidocaine (ASPERCREME LIDOCAINE) 4 % Place 1 patch onto the skin daily. Apply to SOLE OF RIGHT FOOT    [provider]  Multiple Vitamin (MULTIVITAMIN WITH MINERALS) TABS Take 1 tablet by mouth daily.    [provider]  Omega-3 Fatty Acids (FISH OIL) 1200 MG CAPS Take 1 capsule by mouth daily.    [provider]  polyethylene glycol (MIRALAX / GLYCOLAX) 17 g packet Take 17 g by mouth daily.    [provider]  sennosides-docusate sodium (SENOKOT-S) 8.6-50 MG tablet Take 2 tablets by mouth 2 (two) times daily.    [provider]  Sodium Phosphates (FLEET ENEMA RE) Insert 1 unit rectally as needed for constipation repeat until positive results    [provider]  traMADol (ULTRAM) 50 MG tablet Take 50 mg by mouth every 6 (six) hours as needed.    [provider]      Allergies    Dust mite extract, Mushroom extract complex, and Penicillins    Review of Systems   Review of Systems  Physical Exam Updated Vital Signs BP 113/67 (BP Location: Left Arm)   Pulse (!) 103   Temp 97.9 F (36.6 C)   Resp 18   SpO2 100%  Physical Exam Vitals and nursing note reviewed.  Constitutional:  General: She is not in acute distress.    Appearance: She is well-developed.  HENT:     Head: Normocephalic and atraumatic.  Eyes:     Conjunctiva/sclera: Conjunctivae normal.     Pupils: Pupils are equal, round, and reactive to light.  Cardiovascular:     Rate and Rhythm: Normal rate and regular rhythm.     Heart sounds: No murmur heard. Pulmonary:     Effort: Pulmonary effort is normal. No respiratory distress.     Breath sounds: Normal breath sounds. No wheezing or rales.  Abdominal:     General: There is no distension.     Palpations: Abdomen is soft.     Tenderness: There is no abdominal tenderness. There is  no guarding or rebound.  Musculoskeletal:        General: Normal range of motion.     Cervical back: Normal range of motion and neck supple. No tenderness. No spinous process tenderness or muscular tenderness.     Right hip: Tenderness and bony tenderness present. No deformity. Normal range of motion.     Left hip: Normal.     Right knee: Normal.     Left knee: Normal.     Right ankle: Normal.     Left ankle: Tenderness present over the medial malleolus.  Skin:    General: Skin is warm and dry.     Findings: No erythema or rash.  Neurological:     Mental Status: She is alert and oriented to person, place, and time. Mental status is at baseline.  Psychiatric:        Behavior: Behavior normal.     ED Results / Procedures / Treatments   Labs (all labs ordered are listed, but only abnormal results are displayed) Labs Reviewed  CBC WITH DIFFERENTIAL/PLATELET - Abnormal; Notable for the following components:      Result Value   Hemoglobin 11.4 (*)    All other components within normal limits  COMPREHENSIVE METABOLIC PANEL - Abnormal; Notable for the following components:   Potassium 3.4 (*)    Glucose, Bld 107 (*)    BUN 26 (*)    Creatinine, Ser 1.09 (*)    GFR, Estimated 49 (*)    All other components within normal limits  URINALYSIS, W/ REFLEX TO CULTURE (INFECTION SUSPECTED) - Abnormal; Notable for the following components:   Color, Urine STRAW (*)    Specific Gravity, Urine 1.003 (*)    Hgb urine dipstick SMALL (*)    All other components within normal limits    EKG None  Radiology CT Head Wo Contrast  Result Date: 09/10/2022 CLINICAL DATA:  Head trauma, minor (Age >= 65y) EXAM: CT HEAD WITHOUT CONTRAST TECHNIQUE: Contiguous axial images were obtained from the base of the skull through the vertex without intravenous contrast. RADIATION DOSE REDUCTION: This exam was performed according to the departmental dose-optimization program which includes automated exposure  control, adjustment of the mA and/or kV according to patient size and/or use of iterative reconstruction technique. COMPARISON:  CT head May 9, 24. FINDINGS: Brain: No evidence of acute infarction, hemorrhage, hydrocephalus, extra-axial collection or mass lesion/mass effect. Vascular: No hyperdense vessel identified Skull: No acute fracture. Sinuses/Orbits: Clear sinuses.  No acute orbital findings. Other: No mastoid effusions. IMPRESSION: No evidence of acute intracranial abnormality. Electronically Signed   By: Feliberto Harts M.D.   On: 09/10/2022 16:45   DG Ankle Complete Left  Result Date: 09/10/2022 CLINICAL DATA:  Unwitnessed fall.  Hip and leg  pain. EXAM: LEFT ANKLE COMPLETE - 3+ VIEW COMPARISON:  None available FINDINGS: Diffuse osteopenia. Talar dome is intact. No fracture or dislocation. IMPRESSION: No acute abnormality of the left ankle. Electronically Signed   By: Acquanetta Belling M.D.   On: 09/10/2022 16:41   DG Hip Unilat W or Wo Pelvis 2-3 Views Right  Result Date: 09/10/2022 CLINICAL DATA:  Unwitnessed fall.  Neck, hip, and leg pain. EXAM: DG HIP (WITH OR WITHOUT PELVIS) 2-3V RIGHT COMPARISON:  06/05/2022 FINDINGS: No fracture or dislocation of the right hip. Moderate degenerative changes of the right hip. Limited cross-table lateral view. Advanced degenerative changes of the lumbar spine partially visualized. IMPRESSION: No fracture or dislocation of the right hip. If the patient is unable to bear weight or has significant pain, consider CT or MRI of the right hip. Electronically Signed   By: Acquanetta Belling M.D.   On: 09/10/2022 16:40    Procedures Procedures    Medications Ordered in ED Medications  HYDROcodone-acetaminophen (NORCO/VICODIN) 5-325 MG per tablet 0.5 tablet (0.5 tablets Oral Given 09/10/22 1756)    ED Course/ Medical Decision Making/ A&P                                 Medical Decision Making Amount and/or Complexity of Data Reviewed Labs: ordered. Decision-making  details documented in ED Course. Radiology: ordered and independent interpretation performed. Decision-making details documented in ED Course.  Risk Prescription drug management.   Elderly female presenting today after an unwitnessed fall.  She has a history of recurrent falls but cannot recall why she fell today.  She is at her baseline per facility and is awake and in no acute distress at this time.  She has no focal tenderness in her neck and her cervical spine was cleared.  She does report she is not sure if she hit her head.  No obvious signs of trauma to her head but will scan to ensure no acute injury.  She is also complaining of pain in her right hip and her left ankle.  Plain films are pending.  Patient's vital signs are reassuring, however when evaluating the notes from her facility they report that she has had increased confusion in the last 2 days and last night she got up and went to the dining hall waiting for breakfast and was argumentative and then was found laying on her right side today by her walker after the fall.  Will assure normal labs without electrolyte abnormalities, AKI or UTI.  6:47 PM I independently interpreted patient's labs and UA with no evidence of UTI today, CMP with no significant findings, CBC within normal limits.  I have independently visualized and interpreted pt's images today. Head CT, hip and ankle images are all negative for acute injury.  No intracranial hemorrhage.  Patient has been able to get out of bed and walk here and low suspicion for occult fracture.  Patient is stable for discharge back to her facility        Final Clinical Impression(s) / ED Diagnoses Final diagnoses:  Fall, initial encounter    Rx / DC Orders ED Discharge Orders     None         Gwyneth Sprout, MD 09/10/22 1847

## 2022-09-10 NOTE — Progress Notes (Signed)
Location:  [name] Room Number: [phone] Place of Service:  [phone]) Provider:  Kenard Gower, DNP, FNP-BC  Patient Care Team: Frederica Kuster, MD as PCP - General (Family Medicine) [name], [name], NP as Nurse Practitioner (Nurse Practitioner) Nelson Chimes, MD as Consulting Physician (Ophthalmology) Marcene Corning, MD as Consulting Physician (Orthopedic Surgery) [place], [name], MD as Consulting Physician (Dermatology) Hart Carwin, MD (Inactive) as Consulting Physician (Gastroenterology) Ovidio Kin, MD as Consulting Physician (General Surgery)  Extended Emergency Contact Information Primary Emergency Contact: Jimmey Ralph (POA),Debra          [place], [place] Mobile Phone: [phone] Relation: Daughter  Code Status:   Full Code  Goals of care: Advanced Directive information    06/05/2022    4:33 PM  Advanced Directives  Does Patient Have a Medical Advance Directive? Yes  Type of Estate agent of [place];Living will     Chief Complaint  Patient presents with   Acute Visit    Right hip hurting and increase in confusion    HPI:  Pt is a 89 y.o. female seen today for an acute visit regarding right knee pain and increase in confusion. She was seen in her room lying on her right side with her walker behind her. She complained of severe pain on her right knee and hip.  She was reported to have increase in confusion for the past 2 days. Last night, she got up and went to the dining hall waiting for breakfast. She was reported to be argumentative. She had been complaining of right hip pain earlier and portable x-ray was done, awaiting result.  She takes Fosamax for osteoporosis.   Past Medical History:  Diagnosis Date   Cerebral atrophy (HCC) 01/21/2013   Hyperlipidemia    Hypothyroid    Osteoarthrosis, unspecified whether generalized or localized, unspecified site     Osteoporosis, unspecified    Other and unspecified hyperlipidemia    Other generalized ischemic cerebrovascular disease 01/21/2013   small vessel disease   Syncope and collapse    Umbilical hernia without mention of obstruction or gangrene    Unspecified hearing loss    Unspecified hypothyroidism    Unspecified vitamin D deficiency    Ventral hernia, unspecified, without mention of obstruction or gangrene    Past Surgical History:  Procedure Laterality Date   CATARACT EXTRACTION W/ INTRAOCULAR LENS  IMPLANT, BILATERAL  2012   Dr. Hazle Quant   HERNIA REPAIR  01/2008   laparoscopic Dr. Ovidio Kin   INGUINAL HERNIA REPAIR N/A 05/10/2019   Procedure: LAPAROSCOPIC FEMORAL HERNIA REPAIR WITH MESH;  Surgeon: Axel Filler, MD;  Location: [facility];  Service: General;  Laterality: N/A;   KNEE SURGERY  2008   Dr. Berneice Heinrich  left   TOE AMPUTATION  1986   little toe right foot  Dr. Cliffton Asters; [place]    Allergies  Allergen Reactions   Dust Mite Extract    Mushroom Extract Complex Other (See Comments)    "Allergic," per paperwork from facility   Penicillins Diarrhea and Other (See Comments)    "Allergic," per paperwork from facility    Outpatient Encounter Medications as of 09/10/2022  Medication Sig   acetaminophen (TYLENOL) 500 MG tablet Take 2 tablets (1,000 mg total) by mouth every 6 (six) hours as needed.   alendronate (FOSAMAX) 70 MG tablet Take 70 mg by mouth once a week. related to AGE-RELATED OSTEOPOROSIS WITHOUT CURRENT PATHOLOGICAL FRACTURE (M81.0)  aspirin 81 MG tablet Take 81 mg by mouth daily.   Calcium Carbonate-Vitamin D 600-200 MG-UNIT TABS Take 1 tablet by mouth 2 (two) times daily.   HYDROcodone-acetaminophen (NORCO/VICODIN) 5-325 MG tablet Take 0.5 tablets by mouth 2 (two) times daily. Two listings per [employer] at [facility]   hydrocortisone (PROCTO-MED [facility]) 2.5 % rectal cream Place 1 Application rectally as needed for hemorrhoids or anal itching.    levothyroxine (SYNTHROID) 100 MCG tablet Take 100 mcg by mouth daily.   lidocaine (ASPERCREME LIDOCAINE) 4 % Place 1 patch onto the skin daily. Apply to SOLE OF RIGHT FOOT   Multiple Vitamin (MULTIVITAMIN WITH MINERALS) TABS Take 1 tablet by mouth daily.   Omega-3 Fatty Acids (FISH OIL) 1200 MG CAPS Take 1 capsule by mouth daily.   polyethylene glycol (MIRALAX / GLYCOLAX) 17 g packet Take 17 g by mouth daily.   sennosides-docusate sodium (SENOKOT-S) 8.6-50 MG tablet Take 2 tablets by mouth 2 (two) times daily.   Sodium Phosphates (FLEET ENEMA RE) Insert 1 unit rectally as needed for constipation repeat until positive results   traMADol (ULTRAM) 50 MG tablet Take 50 mg by mouth every 6 (six) hours as needed.   No facility-administered encounter medications on file as of 09/10/2022.    Review of Systems  Unable to obtain due to dementia.    Immunization History  Administered Date(s) Administered   Influenza Whole 10/28/2011, 11/10/2012, 10/29/2017   Influenza, High Dose Seasonal PF 11/05/2016, 11/11/2018, 11/12/2020   Influenza-Unspecified 11/14/2013, 10/26/2014, 11/08/2015, 11/09/2019, 11/18/2021   Moderna Sars-Covid-2 Vaccination 01/31/2019, 02/28/2019, 12/06/2019, 06/26/2020   PFIZER(Purple Top)SARS-COV-2 Vaccination 10/16/2020   PPD Test 02/27/2021   Pfizer Covid-19 Vaccine Bivalent Booster 93yrs & up 11/28/2021   Pneumococcal Conjugate-13 02/05/2017   Pneumococcal Polysaccharide-23 02/08/2007   Td 01/28/1991, 06/12/2017   Tdap 06/12/2017   Zoster Recombinant(Shingrix) 07/09/2017, 09/15/2017   Zoster, Live 12/27/2012   Pertinent  Health Maintenance Due  Topic Date Due   INFLUENZA VACCINE  08/28/2022   DEXA SCAN  Completed      09/02/2019   12:05 PM 04/20/2020   10:57 AM 02/15/2021   12:46 PM 02/16/2021    2:41 AM 02/18/2022   10:36 AM  Fall Risk  Falls in the past year? 1 0   1  Was there an injury with Fall? 1    1  Fall Risk Category Calculator 2    3  Fall Risk Category  (Retired) Moderate      (RETIRED) Patient Fall Risk Level   Low fall risk High fall risk   Patient at Risk for Falls Due to     History of fall(s);Impaired balance/gait;Impaired mobility  Fall risk Follow up     Education provided     Vitals:   09/10/22 1325  BP: (!) 137/57  Pulse: 88  Resp: (!) 22  Temp: 98.4 F (36.9 C)  SpO2: 96%  Weight: 121 lb 9.6 oz (55.2 kg)  Height: 4\' 10"  (1.473 m)   Body mass index is 25.41 kg/m.  Physical Exam Constitutional:      Appearance: Normal appearance.  HENT:     Head: Normocephalic and atraumatic.     Nose: Nose normal.     Mouth/Throat:     Mouth: Mucous membranes are moist.  Eyes:     Conjunctiva/sclera: Conjunctivae normal.  Cardiovascular:     Rate and Rhythm: Normal rate and regular rhythm.  Pulmonary:     Effort: Pulmonary effort is normal.  Breath sounds: Normal breath sounds.  Abdominal:     General: Bowel sounds are normal.     Palpations: Abdomen is soft.  Musculoskeletal:     Cervical back: Normal range of motion.     Right lower leg: Edema present.     Left lower leg: Edema present.     Comments: Kyphotic, uses walker when walking  Skin:    General: Skin is warm and dry.  Neurological:     Mental Status: She is alert.     Comments: Alert to self, disoriented to time and place.  Psychiatric:        Mood and Affect: Mood normal.        Behavior: Behavior normal.      Labs reviewed: Recent Labs    02/10/22 0000 03/20/22 0000 06/05/22 1715  NA 133* 139 139  K 4.7 4.4 3.8  CL 101 106 106  CO2 23* 23* 22  GLUCOSE  --   --  118*  BUN 18 12 17   CREATININE 1.0 1.1 0.99  CALCIUM 9.1  --  9.3   Recent Labs    02/10/22 0000 06/05/22 1715  AST 21 22  ALT 13 15  ALKPHOS 70 69  BILITOT  --  0.5  PROT  --  7.4  ALBUMIN 3.9 3.8   Recent Labs    02/10/22 0000 03/20/22 0000 06/05/22 1715  WBC 8.7 6.3 8.6  NEUTROABS 6,586.00 3,805.00 5.7  HGB 11.7* 12.5 11.8*  HCT 36 37 37.9  MCV  --   --  93.6   PLT 375 397 347   Lab Results  Component Value Date   TSH 2.25 03/20/2022   Lab Results  Component Value Date   HGBA1C 5.9 (H) 01/31/2020   Lab Results  Component Value Date   CHOL 205 (H) 11/22/2020   HDL 65 11/22/2020   LDLCALC 110 (H) 11/22/2020   TRIG 180 (H) 11/22/2020   CHOLHDL 3.2 11/22/2020    Significant Diagnostic Results in last 30 days:  No results found.  Assessment/Plan  1. Acute right hip pain -  S/P fall   -  continue Norco 5-325 mg 0.5 tab BID -   will transfer to the hospital for further evaluation  2. Acute pain of right knee -  S/P fall -   complains of severe pain when  knee is moved -  will transfer to hospital for further evaluation  3. Agitation -   staff was not able to obtain urine sample to check for UTI  4. Age related osteoporosis, unspecified pathological fracture presence -  continue Fosamax   Family/ staff Communication: Discussed plan of care with charge nurse  Labs/tests ordered:  UA with CS, CBC and BMP, x-ray of right hip    Kenard Gower, DNP, MSN, FNP-BC [facility] [phone] (Monday-Friday 8:00 a.m. - 5:00 p.m.) [phone] (after hours)

## 2022-09-10 NOTE — Discharge Instructions (Addendum)
Blood work today was normal, urine test was negative for infection.  All scans were normal and patient was able to walk here.  She can take her home as needed medication as needed

## 2022-09-10 NOTE — Addendum Note (Signed)
Addended by: Kenard Gower C on: 09/10/2022 02:31 PM   Modules accepted: Level of Service

## 2022-09-10 NOTE — ED Triage Notes (Signed)
Pt BIBA from [facility] for an unwitnessed fall. Pt c/o neck pain, hip and leg pain- unknown if this is due to her chronic pain or new. Hx of dementia. 138/72 90 97% 127 cbg

## 2022-09-10 NOTE — ED Notes (Signed)
[facility] notified for transport 

## 2022-09-10 NOTE — ED Notes (Signed)
Patient transported to x-ray. ?

## 2022-09-11 ENCOUNTER — Non-Acute Institutional Stay: Payer: PPO | Admitting: Nurse Practitioner

## 2022-09-11 ENCOUNTER — Encounter: Payer: Self-pay | Admitting: Nurse Practitioner

## 2022-09-11 DIAGNOSIS — N3281 Overactive bladder: Secondary | ICD-10-CM

## 2022-09-11 DIAGNOSIS — M545 Low back pain, unspecified: Secondary | ICD-10-CM | POA: Diagnosis not present

## 2022-09-11 DIAGNOSIS — N1831 Chronic kidney disease, stage 3a: Secondary | ICD-10-CM

## 2022-09-11 DIAGNOSIS — M85852 Other specified disorders of bone density and structure, left thigh: Secondary | ICD-10-CM | POA: Diagnosis not present

## 2022-09-11 DIAGNOSIS — E785 Hyperlipidemia, unspecified: Secondary | ICD-10-CM

## 2022-09-11 DIAGNOSIS — W19XXXD Unspecified fall, subsequent encounter: Secondary | ICD-10-CM | POA: Diagnosis not present

## 2022-09-11 DIAGNOSIS — F03A0 Unspecified dementia, mild, without behavioral disturbance, psychotic disturbance, mood disturbance, and anxiety: Secondary | ICD-10-CM

## 2022-09-11 DIAGNOSIS — E039 Hypothyroidism, unspecified: Secondary | ICD-10-CM

## 2022-09-11 DIAGNOSIS — R269 Unspecified abnormalities of gait and mobility: Secondary | ICD-10-CM

## 2022-09-11 DIAGNOSIS — K5900 Constipation, unspecified: Secondary | ICD-10-CM | POA: Diagnosis not present

## 2022-09-11 DIAGNOSIS — D638 Anemia in other chronic diseases classified elsewhere: Secondary | ICD-10-CM

## 2022-09-11 DIAGNOSIS — G8929 Other chronic pain: Secondary | ICD-10-CM

## 2022-09-11 NOTE — Assessment & Plan Note (Signed)
09/10/22 Fall, unremarkable UA, X-ray L ankle, R hip/pelvis, CT head CBC, CMP, except K 3.3, slightly elevated Bun/creat. Poor safety awareness and unsteady gait are contributory, close supervision, assistance for safety.

## 2022-09-11 NOTE — Assessment & Plan Note (Signed)
Bun/creat 26/1.09 K 3.3 09/10/22, repeat BMP

## 2022-09-11 NOTE — Assessment & Plan Note (Signed)
Hgb 11.4 09/10/22.

## 2022-09-11 NOTE — Assessment & Plan Note (Signed)
LDL 110 11/22/20, taking Omega 3 

## 2022-09-11 NOTE — Assessment & Plan Note (Signed)
stable, on Mirlax, Senokot S

## 2022-09-11 NOTE — Assessment & Plan Note (Signed)
lower back pain, R hip, L ankle, on Norco, Tramadol, Tylenol, 02/10/22 T11, L4 compression fx uncertain age.

## 2022-09-11 NOTE — Assessment & Plan Note (Signed)
 ambulates with walker, risk of falling.

## 2022-09-11 NOTE — Assessment & Plan Note (Signed)
OAB, not new, up to 3-4x per night, declined urology consultation

## 2022-09-11 NOTE — Progress Notes (Signed)
Location:   [facility]   Place of Service:    Provider: [facility]  NP  Frederica Kuster, MD  Patient Care Team: Frederica Kuster, MD as PCP - General (Family Medicine) [name] ,  X, NP as Nurse Practitioner (Nurse Practitioner) Nelson Chimes, MD as Consulting Physician (Ophthalmology) Marcene Corning, MD as Consulting Physician (Orthopedic Surgery) [place], [name], MD as Consulting Physician (Dermatology) Hart Carwin, MD (Inactive) as Consulting Physician (Gastroenterology) Ovidio Kin, MD as Consulting Physician (General Surgery)  Extended Emergency Contact Information Primary Emergency Contact: [name],[name] Mobile Phone: [phone] Relation: Son Secondary Emergency Contact: Jimmey Ralph (POA),Debra          [place], [name] Mobile Phone: [phone] Relation: Daughter  Code Status: DNR Goals of care: Advanced Directive information    09/11/2022    4:03 PM  Advanced Directives  Does Patient Have a Medical Advance Directive? Yes  Type of Estate agent of [place];Out of facility DNR (pink MOST or yellow form)  Does patient want to make changes to medical advance directive? No - Patient declined  Copy of Healthcare Power of Attorney in Chart? Yes - validated most recent copy scanned in chart (See row information)  Pre-existing out of facility DNR order (yellow form or pink MOST form) Pink MOST form placed in chart (order not valid for inpatient use)     No chief complaint on file.   HPI:  Pt is a 89 y.o. female seen today for an acute visit for f/u ED evaluation for fall.   09/10/22 Fall, unremarkable UA, X-ray L ankle, R hip/pelvis, CT head CBC, CMP, except K 3.3, slightly elevated Bun/creat.    ED eval 06/05/22 for fall, left forehead hematoma. CT head, cervical spine, X-ray chest, left shoulder, pelvis, right tibia/fibula no acute process   Recurrent falls, ambulates with walker, unsteady gait. lower back  pain, R hip, L ankle, on Norco, Tramadol, Tylenol, 02/10/22 T11, L4 compression fx uncertain age.  OP, on Alendronate, Ca, Vit D Constipation, stable, on Mirlax, Senokot S Hx of syncope,  aortic valve stenosis CKD Bun/creat 26/1.09 09/10/22 Hyperlipidemia, LDL 110 11/22/20, taking Omega 3 Hypothyroidism, taking Levothyroxine, TSH 2.25 03/20/22 Dementia, mild, CT head 09/10/22, no acute process,   no behavioral issues. MMSE 24/30 02/18/22, on ASA 81mg               Anemia, Hgb 11.4 09/10/22.              Urinary frequency, OAB, not new, up to 3-4x per night, declined urology consultation             Gait abnormality, ambulates with walker, risk of falling.     Past Medical History:  Diagnosis Date   Cerebral atrophy (HCC) 01/21/2013   Hyperlipidemia    Hypothyroid    Osteoarthrosis, unspecified whether generalized or localized, unspecified site    Osteoporosis, unspecified    Other and unspecified hyperlipidemia    Other generalized ischemic cerebrovascular disease 01/21/2013   small vessel disease   Syncope and collapse    Umbilical hernia without mention of obstruction or gangrene    Unspecified hearing loss    Unspecified hypothyroidism    Unspecified vitamin D deficiency    Ventral hernia, unspecified, without mention of obstruction or gangrene    Past Surgical History:  Procedure Laterality Date   CATARACT EXTRACTION W/ INTRAOCULAR LENS  IMPLANT, BILATERAL  2012   Dr. Hazle Quant   HERNIA REPAIR  01/2008   laparoscopic  Dr. Ovidio Kin   INGUINAL HERNIA REPAIR N/A 05/10/2019   Procedure: LAPAROSCOPIC FEMORAL HERNIA REPAIR WITH MESH;  Surgeon: Axel Filler, MD;  Location: [facility] OR;  Service: General;  Laterality: N/A;   KNEE SURGERY  2008   Dr. Berneice Heinrich  left   TOE AMPUTATION  1986   little toe right foot  Dr. Cliffton Asters; [place]    Allergies  Allergen Reactions   Dust Mite Extract    Mushroom Extract Complex Other (See Comments)    "Allergic," per paperwork from facility    Penicillins Diarrhea and Other (See Comments)    "Allergic," per paperwork from facility    Allergies as of 09/11/2022       Reactions   Dust Mite Extract    Mushroom Extract Complex Other (See Comments)   "Allergic," per paperwork from facility   Penicillins Diarrhea, Other (See Comments)   "Allergic," per paperwork from facility        Medication List        Accurate as of September 11, 2022  4:15 PM. If you have any questions, ask your nurse or doctor.          STOP taking these medications    polyethylene glycol 17 g packet Commonly known as: MIRALAX / GLYCOLAX Stopped by:  X    traMADol 50 MG tablet Commonly known as: ULTRAM Stopped by:  X        TAKE these medications    acetaminophen 500 MG tablet Commonly known as: TYLENOL Take 2 tablets (1,000 mg total) by mouth every 6 (six) hours as needed.   Aspercreme Lidocaine 4 % Generic drug: lidocaine Place 1 patch onto the skin daily. Apply to SOLE OF RIGHT FOOT   aspirin 81 MG tablet Take 81 mg by mouth daily.   Calcium Carbonate-Vitamin D 600-200 MG-UNIT Tabs Take 1 tablet by mouth 2 (two) times daily.   Fish Oil 1200 MG Caps Take 1 capsule by mouth daily.   FLEET ENEMA RE Insert 1 unit rectally as needed for constipation repeat until positive results   Fosamax 70 MG tablet Generic drug: alendronate Take 70 mg by mouth once a week. related to AGE-RELATED OSTEOPOROSIS WITHOUT CURRENT PATHOLOGICAL FRACTURE (M81.0)   HYDROcodone-acetaminophen 5-325 MG tablet Commonly known as: NORCO/VICODIN Take 0.5 tablets by mouth 2 (two) times daily. Two listings per [employer] at [facility]   hydrocortisone 2.5 % rectal cream Commonly known as: Procto-Med HC Place 1 Application rectally as needed for hemorrhoids or anal itching.   levothyroxine 100 MCG tablet Commonly known as: SYNTHROID Take 100 mcg by mouth daily.   multivitamin with minerals Tabs tablet Take 1 tablet by  mouth daily.   sennosides-docusate sodium 8.6-50 MG tablet Commonly known as: SENOKOT-S Take 2 tablets by mouth 2 (two) times daily.        Review of Systems  Constitutional:  Negative for appetite change, fatigue and fever.  HENT:  Positive for hearing loss. Negative for congestion and voice change.   Respiratory:  Negative for cough.   Cardiovascular:  Negative for leg swelling.  Gastrointestinal:  Negative for abdominal pain.  Genitourinary:  Positive for frequency. Negative for dysuria and urgency.  Musculoskeletal:  Positive for arthralgias, back pain and gait problem.       Lower back pain, spinal spinous process tenderness palpated, pain worsens with movement.   Skin:  Negative for color change.  Neurological:  Negative for speech difficulty, weakness and light-headedness.  Memory lapses.   Psychiatric/Behavioral:  Negative for behavioral problems and sleep disturbance. The patient is not nervous/anxious.     Immunization History  Administered Date(s) Administered   Influenza Whole 10/28/2011, 11/10/2012, 10/29/2017   Influenza, High Dose Seasonal PF 11/05/2016, 11/11/2018, 11/12/2020   Influenza-Unspecified 11/14/2013, 10/26/2014, 11/08/2015, 11/09/2019, 11/18/2021   Moderna Sars-Covid-2 Vaccination 01/31/2019, 02/28/2019, 12/06/2019, 06/26/2020   PFIZER(Purple Top)SARS-COV-2 Vaccination 10/16/2020   PPD Test 02/27/2021   Pfizer Covid-19 Vaccine Bivalent Booster 14yrs & up 11/28/2021   Pneumococcal Conjugate-13 02/05/2017   Pneumococcal Polysaccharide-23 02/08/2007   Td 01/28/1991, 06/12/2017   Tdap 06/12/2017   Zoster Recombinant(Shingrix) 07/09/2017, 09/15/2017   Zoster, Live 12/27/2012   Pertinent  Health Maintenance Due  Topic Date Due   INFLUENZA VACCINE  08/28/2022   DEXA SCAN  Completed      09/02/2019   12:05 PM 04/20/2020   10:57 AM 02/15/2021   12:46 PM 02/16/2021    2:41 AM 02/18/2022   10:36 AM  Fall Risk  Falls in the past year? 1 0   1  Was  there an injury with Fall? 1    1  Fall Risk Category Calculator 2    3  Fall Risk Category (Retired) Moderate      (RETIRED) Patient Fall Risk Level   Low fall risk High fall risk   Patient at Risk for Falls Due to     History of fall(s);Impaired balance/gait;Impaired mobility  Fall risk Follow up     Education provided   Functional Status Survey:    Vitals:   09/11/22 1550  BP: (!) 137/57  Pulse: 88  Resp: 16  Temp: 97.7 F (36.5 C)  SpO2: 97%  Weight: 121 lb 9.6 oz (55.2 kg)  Height: 4\' 10"  (1.473 m)   Body mass index is 25.41 kg/m. Physical Exam Constitutional:      Appearance: Normal appearance.  HENT:     Head: Normocephalic and atraumatic.     Mouth/Throat:     Mouth: Mucous membranes are moist.  Eyes:     Extraocular Movements: Extraocular movements intact.     Conjunctiva/sclera: Conjunctivae normal.     Pupils: Pupils are equal, round, and reactive to light.  Cardiovascular:     Rate and Rhythm: Normal rate and regular rhythm.     Heart sounds: Murmur heard.  Pulmonary:     Effort: Pulmonary effort is normal.     Breath sounds: Normal breath sounds. No rales.  Abdominal:     General: Bowel sounds are normal. There is no distension.     Palpations: Abdomen is soft.     Tenderness: There is no abdominal tenderness. There is no right CVA tenderness, left CVA tenderness, guarding or rebound.  Musculoskeletal:        General: Swelling, tenderness, deformity and signs of injury present.     Cervical back: Normal range of motion and neck supple.     Right lower leg: No edema.     Left lower leg: No edema.     Comments: Lower back pain, positional and with movement spinal spinous process ?L1, L2, tenderness palpated.  R shoulder pain.   Skin:    General: Skin is warm and dry.     Findings: Bruising present.     Comments: Left scalp hematoma.   Neurological:     General: No focal deficit present.     Mental Status: She is alert and oriented to person, place,  and time. Mental status is at baseline.  Gait: Gait abnormal.  Psychiatric:        Mood and Affect: Mood normal.        Behavior: Behavior normal.        Thought Content: Thought content normal.     Labs reviewed: Recent Labs    02/10/22 0000 03/20/22 0000 06/05/22 1715 09/10/22 1710  NA 133* 139 139 138  K 4.7 4.4 3.8 3.4*  CL 101 106 106 103  CO2 23* 23* 22 26  GLUCOSE  --   --  118* 107*  BUN 18 12 17  26*  CREATININE 1.0 1.1 0.99 1.09*  CALCIUM 9.1  --  9.3 9.4   Recent Labs    02/10/22 0000 06/05/22 1715 09/10/22 1710  AST 21 22 21   ALT 13 15 16   ALKPHOS 70 69 65  BILITOT  --  0.5 0.3  PROT  --  7.4 7.5  ALBUMIN 3.9 3.8 3.9   Recent Labs    03/20/22 0000 06/05/22 1715 09/10/22 1710  WBC 6.3 8.6 9.4  NEUTROABS 3,805.00 5.7 6.5  HGB 12.5 11.8* 11.4*  HCT 37 37.9 36.2  MCV  --  93.6 92.6  PLT 397 347 347   Lab Results  Component Value Date   TSH 2.25 03/20/2022   Lab Results  Component Value Date   HGBA1C 5.9 (H) 01/31/2020   Lab Results  Component Value Date   CHOL 205 (H) 11/22/2020   HDL 65 11/22/2020   LDLCALC 110 (H) 11/22/2020   TRIG 180 (H) 11/22/2020   CHOLHDL 3.2 11/22/2020    Significant Diagnostic Results in last 30 days:  CT Head Wo Contrast  Result Date: 09/10/2022 CLINICAL DATA:  Head trauma, minor (Age >= 65y) EXAM: CT HEAD WITHOUT CONTRAST TECHNIQUE: Contiguous axial images were obtained from the base of the skull through the vertex without intravenous contrast. RADIATION DOSE REDUCTION: This exam was performed according to the departmental dose-optimization program which includes automated exposure control, adjustment of the mA and/or kV according to patient size and/or use of iterative reconstruction technique. COMPARISON:  CT head May 9, 24. FINDINGS: Brain: No evidence of acute infarction, hemorrhage, hydrocephalus, extra-axial collection or mass lesion/mass effect. Vascular: No hyperdense vessel identified Skull: No acute  fracture. Sinuses/Orbits: Clear sinuses.  No acute orbital findings. Other: No mastoid effusions. IMPRESSION: No evidence of acute intracranial abnormality. Electronically Signed   By: Feliberto Harts M.D.   On: 09/10/2022 16:45   DG Ankle Complete Left  Result Date: 09/10/2022 CLINICAL DATA:  Unwitnessed fall.  Hip and leg pain. EXAM: LEFT ANKLE COMPLETE - 3+ VIEW COMPARISON:  None available FINDINGS: Diffuse osteopenia. Talar dome is intact. No fracture or dislocation. IMPRESSION: No acute abnormality of the left ankle. Electronically Signed   By: Acquanetta Belling M.D.   On: 09/10/2022 16:41   DG Hip Unilat W or Wo Pelvis 2-3 Views Right  Result Date: 09/10/2022 CLINICAL DATA:  Unwitnessed fall.  Neck, hip, and leg pain. EXAM: DG HIP (WITH OR WITHOUT PELVIS) 2-3V RIGHT COMPARISON:  06/05/2022 FINDINGS: No fracture or dislocation of the right hip. Moderate degenerative changes of the right hip. Limited cross-table lateral view. Advanced degenerative changes of the lumbar spine partially visualized. IMPRESSION: No fracture or dislocation of the right hip. If the patient is unable to bear weight or has significant pain, consider CT or MRI of the right hip. Electronically Signed   By: Acquanetta Belling M.D.   On: 09/10/2022 16:40    Assessment/Plan: Fall 09/10/22 Fall, unremarkable  UA, X-ray L ankle, R hip/pelvis, CT head CBC, CMP, except K 3.3, slightly elevated Bun/creat. Poor safety awareness and unsteady gait are contributory, close supervision, assistance for safety.   Low back pain lower back pain, R hip, L ankle, on Norco, Tramadol, Tylenol, 02/10/22 T11, L4 compression fx uncertain age.   Osteopenia on Alendronate, Ca, Vit D  Constipation stable, on Mirlax, Senokot S  Stage 3a chronic kidney disease (HCC) Bun/creat 26/1.09 K 3.3 09/10/22, repeat BMP  Dyslipidemia  LDL 110 11/22/20, taking Omega 3  Hypothyroidism  taking Levothyroxine, TSH 2.25 03/20/22  Mild dementia (HCC) mild, CT head  09/10/22, no acute process,   no behavioral issues. MMSE 24/30 02/18/22  Anemia, chronic disease Hgb 11.4 09/10/22.   OAB (overactive bladder) OAB, not new, up to 3-4x per night, declined urology consultation  Abnormality of gait  ambulates with walker, risk of falling.     Family/ staff Communication: plan of care reviewed with the patient and charge nurse.   Labs/tests ordered:  BMP  Time spend 40 minutes.

## 2022-09-11 NOTE — Assessment & Plan Note (Signed)
mild, CT head 09/10/22, no acute process,   no behavioral issues. MMSE 24/30 02/18/22

## 2022-09-11 NOTE — Assessment & Plan Note (Signed)
taking Levothyroxine, TSH 2.25 03/20/22

## 2022-09-11 NOTE — Assessment & Plan Note (Signed)
on Alendronate, Ca, Vit D

## 2022-09-16 DIAGNOSIS — G8929 Other chronic pain: Secondary | ICD-10-CM | POA: Diagnosis not present

## 2022-09-16 DIAGNOSIS — N189 Chronic kidney disease, unspecified: Secondary | ICD-10-CM | POA: Diagnosis not present

## 2022-09-16 LAB — BASIC METABOLIC PANEL
BUN: 26 — AB (ref 4–21)
CO2: 23 — AB (ref 13–22)
Chloride: 105 (ref 99–108)
Creatinine: 1 (ref 0.5–1.1)
Glucose: 92
Potassium: 4.2 meq/L (ref 3.5–5.1)
Sodium: 139 (ref 137–147)

## 2022-09-16 LAB — COMPREHENSIVE METABOLIC PANEL: Calcium: 8.9 (ref 8.7–10.7)

## 2022-09-17 ENCOUNTER — Encounter: Payer: Self-pay | Admitting: Nurse Practitioner

## 2022-09-17 DIAGNOSIS — E876 Hypokalemia: Secondary | ICD-10-CM | POA: Insufficient documentation

## 2022-09-18 DIAGNOSIS — G4751 Confusional arousals: Secondary | ICD-10-CM | POA: Diagnosis not present

## 2022-09-18 DIAGNOSIS — R300 Dysuria: Secondary | ICD-10-CM | POA: Diagnosis not present

## 2022-09-23 ENCOUNTER — Emergency Department (HOSPITAL_COMMUNITY): Payer: PPO

## 2022-09-23 ENCOUNTER — Emergency Department (HOSPITAL_COMMUNITY)
Admission: EM | Admit: 2022-09-23 | Discharge: 2022-09-24 | Disposition: A | Discharge status: Home / Self Care | Payer: PPO | Attending: Emergency Medicine | Admitting: Emergency Medicine

## 2022-09-23 DIAGNOSIS — Z7982 Long term (current) use of aspirin: Secondary | ICD-10-CM | POA: Diagnosis not present

## 2022-09-23 DIAGNOSIS — M8580 Other specified disorders of bone density and structure, unspecified site: Secondary | ICD-10-CM | POA: Diagnosis not present

## 2022-09-23 DIAGNOSIS — S79911A Unspecified injury of right hip, initial encounter: Secondary | ICD-10-CM | POA: Diagnosis not present

## 2022-09-23 DIAGNOSIS — S0083XA Contusion of other part of head, initial encounter: Secondary | ICD-10-CM | POA: Diagnosis not present

## 2022-09-23 DIAGNOSIS — Z043 Encounter for examination and observation following other accident: Secondary | ICD-10-CM | POA: Diagnosis not present

## 2022-09-23 DIAGNOSIS — M25551 Pain in right hip: Secondary | ICD-10-CM | POA: Diagnosis not present

## 2022-09-23 DIAGNOSIS — R9431 Abnormal electrocardiogram [ECG] [EKG]: Secondary | ICD-10-CM | POA: Diagnosis not present

## 2022-09-23 DIAGNOSIS — W19XXXA Unspecified fall, initial encounter: Secondary | ICD-10-CM | POA: Diagnosis not present

## 2022-09-23 DIAGNOSIS — M1611 Unilateral primary osteoarthritis, right hip: Secondary | ICD-10-CM | POA: Diagnosis not present

## 2022-09-23 DIAGNOSIS — S0990XA Unspecified injury of head, initial encounter: Secondary | ICD-10-CM | POA: Diagnosis not present

## 2022-09-23 DIAGNOSIS — W01198A Fall on same level from slipping, tripping and stumbling with subsequent striking against other object, initial encounter: Secondary | ICD-10-CM | POA: Insufficient documentation

## 2022-09-23 DIAGNOSIS — F0390 Unspecified dementia without behavioral disturbance: Secondary | ICD-10-CM | POA: Diagnosis not present

## 2022-09-23 DIAGNOSIS — G319 Degenerative disease of nervous system, unspecified: Secondary | ICD-10-CM | POA: Diagnosis not present

## 2022-09-23 DIAGNOSIS — S199XXA Unspecified injury of neck, initial encounter: Secondary | ICD-10-CM | POA: Diagnosis not present

## 2022-09-24 ENCOUNTER — Emergency Department (HOSPITAL_COMMUNITY): Payer: PPO

## 2022-09-24 ENCOUNTER — Non-Acute Institutional Stay: Payer: PPO | Admitting: Nurse Practitioner

## 2022-09-24 ENCOUNTER — Encounter: Payer: Self-pay | Admitting: Nurse Practitioner

## 2022-09-24 DIAGNOSIS — N1831 Chronic kidney disease, stage 3a: Secondary | ICD-10-CM

## 2022-09-24 DIAGNOSIS — E782 Mixed hyperlipidemia: Secondary | ICD-10-CM

## 2022-09-24 DIAGNOSIS — M1611 Unilateral primary osteoarthritis, right hip: Secondary | ICD-10-CM | POA: Diagnosis not present

## 2022-09-24 DIAGNOSIS — R269 Unspecified abnormalities of gait and mobility: Secondary | ICD-10-CM

## 2022-09-24 DIAGNOSIS — T07XXXA Unspecified multiple injuries, initial encounter: Secondary | ICD-10-CM

## 2022-09-24 DIAGNOSIS — I350 Nonrheumatic aortic (valve) stenosis: Secondary | ICD-10-CM

## 2022-09-24 DIAGNOSIS — F0390 Unspecified dementia without behavioral disturbance: Secondary | ICD-10-CM | POA: Diagnosis not present

## 2022-09-24 DIAGNOSIS — K5900 Constipation, unspecified: Secondary | ICD-10-CM | POA: Diagnosis not present

## 2022-09-24 DIAGNOSIS — W19XXXD Unspecified fall, subsequent encounter: Secondary | ICD-10-CM | POA: Diagnosis not present

## 2022-09-24 DIAGNOSIS — G319 Degenerative disease of nervous system, unspecified: Secondary | ICD-10-CM | POA: Diagnosis not present

## 2022-09-24 DIAGNOSIS — Z043 Encounter for examination and observation following other accident: Secondary | ICD-10-CM | POA: Diagnosis not present

## 2022-09-24 DIAGNOSIS — S199XXA Unspecified injury of neck, initial encounter: Secondary | ICD-10-CM | POA: Diagnosis not present

## 2022-09-24 DIAGNOSIS — M8580 Other specified disorders of bone density and structure, unspecified site: Secondary | ICD-10-CM | POA: Diagnosis not present

## 2022-09-24 DIAGNOSIS — S79911A Unspecified injury of right hip, initial encounter: Secondary | ICD-10-CM | POA: Diagnosis not present

## 2022-09-24 DIAGNOSIS — E039 Hypothyroidism, unspecified: Secondary | ICD-10-CM

## 2022-09-24 DIAGNOSIS — S0990XA Unspecified injury of head, initial encounter: Secondary | ICD-10-CM | POA: Diagnosis not present

## 2022-09-24 MED ORDER — ACETAMINOPHEN 500 MG PO TABS
1000.0000 mg | ORAL_TABLET | Freq: Once | ORAL | Status: AC
  Administered 2022-09-24: 1000 mg via ORAL
  Filled 2022-09-24: qty 2

## 2022-09-24 MED ORDER — KETOROLAC TROMETHAMINE 60 MG/2ML IM SOLN
30.0000 mg | Freq: Once | INTRAMUSCULAR | Status: AC
  Administered 2022-09-24: 30 mg via INTRAMUSCULAR
  Filled 2022-09-24: qty 2

## 2022-09-24 MED ORDER — LIDOCAINE 5 % EX PTCH
2.0000 | MEDICATED_PATCH | CUTANEOUS | Status: DC
  Administered 2022-09-24: 2 via TRANSDERMAL
  Filled 2022-09-24: qty 2

## 2022-09-24 MED ORDER — KETOROLAC TROMETHAMINE 30 MG/ML IJ SOLN: 15.0000 mg | Freq: Once | INTRAMUSCULAR | Status: DC

## 2022-09-24 NOTE — Assessment & Plan Note (Signed)
 taking Levothyroxine, TSH 2.25 03/20/22

## 2022-09-24 NOTE — Assessment & Plan Note (Signed)
LDL 110 11/22/20, taking Omega 3 

## 2022-09-24 NOTE — Assessment & Plan Note (Signed)
stable, on Mirlax, Senokot S

## 2022-09-24 NOTE — Assessment & Plan Note (Signed)
poor safety awareness,  no behavioral issues, on ASA 81mg 

## 2022-09-24 NOTE — Assessment & Plan Note (Signed)
right forehead hematoma, dorsum left hand bruise, ED CT head/cervical spine, R hip, CXR and X-ray hips/pelvis no acute process

## 2022-09-24 NOTE — Assessment & Plan Note (Signed)
 ambulates with walker, risk of falling.

## 2022-09-24 NOTE — Assessment & Plan Note (Signed)
Hx of syncope,  aortic valve stenosis 

## 2022-09-24 NOTE — ED Provider Notes (Signed)
[place] [facility] AT [facility] Provider Note   CSN: [id number] Arrival date & time: 09/23/22  2343     History  Chief Complaint  Patient presents with   Marletta Lor    Krista Bowman is a 89 y.o. female.  The history is provided by the EMS personnel. The history is limited by the condition of the patient (level 5 caveat dementia).  Fall This is a new problem. The current episode started less than 1 hour ago. The problem occurs constantly. The problem has been resolved. Pertinent negatives include no chest pain and no shortness of breath. Nothing aggravates the symptoms. Nothing relieves the symptoms. She has tried nothing for the symptoms. The treatment provided no relief.  Patient with right hip pain presents with fall striking head.       Home Medications Prior to Admission medications   Medication Sig Start Date End Date Taking? Authorizing Provider  acetaminophen (TYLENOL) 500 MG tablet Take 2 tablets (1,000 mg total) by mouth every 6 (six) hours as needed. 05/11/19   Barnetta Chapel, PA-C  alendronate (FOSAMAX) 70 MG tablet Take 70 mg by mouth once a week. related to AGE-RELATED OSTEOPOROSIS WITHOUT CURRENT PATHOLOGICAL FRACTURE (M81.0)    [provider]  aspirin 81 MG tablet Take 81 mg by mouth daily.    [provider]  Calcium Carbonate-Vitamin D 600-200 MG-UNIT TABS Take 1 tablet by mouth 2 (two) times daily.    [provider]  HYDROcodone-acetaminophen (NORCO/VICODIN) 5-325 MG tablet Take 0.5 tablets by mouth 2 (two) times daily. Two listings per [facility] 06/18/22   Medina-Vargas, Monina C, NP  hydrocortisone (PROCTO-MED HC) 2.5 % rectal cream Place 1 Application rectally as needed for hemorrhoids or anal itching. 04/07/22   Ngetich, Dinah C, NP  levothyroxine (SYNTHROID) 100 MCG tablet Take 100 mcg by mouth daily. 04/19/21   [provider]  lidocaine (ASPERCREME LIDOCAINE) 4 % Place 1  patch onto the skin daily. Apply to SOLE OF RIGHT FOOT    [provider]  Multiple Vitamin (MULTIVITAMIN WITH MINERALS) TABS Take 1 tablet by mouth daily.    [provider]  Omega-3 Fatty Acids (FISH OIL) 1200 MG CAPS Take 1 capsule by mouth daily.    [provider]  sennosides-docusate sodium (SENOKOT-S) 8.6-50 MG tablet Take 2 tablets by mouth 2 (two) times daily.    [provider]  Sodium Phosphates (FLEET ENEMA RE) Insert 1 unit rectally as needed for constipation repeat until positive results    [provider]      Allergies    Dust mite extract, Mushroom extract complex, and Penicillins    Review of Systems   Review of Systems  Unable to perform ROS: Dementia  Respiratory:  Negative for shortness of breath.   Cardiovascular:  Negative for chest pain.    Physical Exam Updated Vital Signs BP 117/68 (BP Location: Right Arm)   Pulse 90   Temp 98.1 F (36.7 C) (Oral)   Resp 14   SpO2 96%  Physical Exam Vitals and nursing note reviewed. Exam conducted with a chaperone present.  Constitutional:      General: She is not in acute distress.    Appearance: She is well-developed.  HENT:     Head: Normocephalic.      Nose: Nose normal.  Eyes:     Pupils: Pupils are equal, round, and reactive to light.  Cardiovascular:     Rate and Rhythm:  Normal rate and regular rhythm.     Pulses: Normal pulses.     Heart sounds: Normal heart sounds.  Pulmonary:     Effort: Pulmonary effort is normal. No respiratory distress.     Breath sounds: Normal breath sounds.  Abdominal:     General: Bowel sounds are normal. There is no distension.     Palpations: Abdomen is soft.     Tenderness: There is no abdominal tenderness. There is no guarding or rebound.  Musculoskeletal:        General: No swelling, tenderness or deformity. Normal range of motion.     Right wrist: No bony tenderness or snuff box tenderness.     Left wrist: No bony tenderness  or snuff box tenderness.     Right hand: No deformity or bony tenderness. Normal capillary refill. Normal pulse.     Left hand: No deformity or bony tenderness. Normal capillary refill. Normal pulse.     Cervical back: Normal, normal range of motion and neck supple. No tenderness.     Thoracic back: Normal.     Lumbar back: Normal.     Right hip: No deformity. Normal range of motion.     Left hip: No deformity. Normal range of motion.     Right ankle: Normal.     Right Achilles Tendon: Normal.     Left ankle: Normal.     Left Achilles Tendon: Normal.     Right foot: Normal.     Left foot: Normal.  Skin:    General: Skin is warm and dry.     Capillary Refill: Capillary refill takes less than 2 seconds.     Findings: No erythema or rash.  Neurological:     General: No focal deficit present.     Deep Tendon Reflexes: Reflexes normal.  Psychiatric:        Mood and Affect: Mood normal.     ED Results / Procedures / Treatments   Labs (all labs ordered are listed, but only abnormal results are displayed) Labs Reviewed - No data to display  EKG None  Radiology CT Hip Right Wo Contrast  Result Date: 09/24/2022 CLINICAL DATA:  Trauma to the right hip.  Concern for fracture. EXAM: CT OF THE RIGHT HIP WITHOUT CONTRAST TECHNIQUE: Multidetector CT imaging of the right hip was performed according to the standard protocol. Multiplanar CT image reconstructions were also generated. RADIATION DOSE REDUCTION: This exam was performed according to the departmental dose-optimization program which includes automated exposure control, adjustment of the mA and/or kV according to patient size and/or use of iterative reconstruction technique. COMPARISON:  Radiograph dated 09/24/2022. FINDINGS: Bones/Joint/Cartilage There is no acute fracture or dislocation. The bones are osteopenic. Severe arthritic changes of the right hip with subcortical cystic changes of the femoral head and acetabulum. There is  narrowing of the joint space with near bone on bone contact. No effusion. Ligaments Suboptimally assessed by CT. Muscles and Tendons No intramuscular fluid collection or hematoma. Soft tissues Contusion of the subcutaneous soft tissues of the lateral hip. No large hematoma or fluid collection. IMPRESSION: 1. No acute fracture or dislocation. 2. Severe arthritic changes of the right hip. Electronically Signed   By: Elgie Collard M.D.   On: 09/24/2022 01:54   CT Head Wo Contrast  Result Date: 09/24/2022 CLINICAL DATA:  Trauma EXAM: CT HEAD WITHOUT CONTRAST CT CERVICAL SPINE WITHOUT CONTRAST TECHNIQUE: Multidetector CT imaging of the head and cervical spine was performed following the standard protocol without  intravenous contrast. Multiplanar CT image reconstructions of the cervical spine were also generated. RADIATION DOSE REDUCTION: This exam was performed according to the departmental dose-optimization program which includes automated exposure control, adjustment of the mA and/or kV according to patient size and/or use of iterative reconstruction technique. COMPARISON:  Head CT 09/10/2022 FINDINGS: CT HEAD FINDINGS Brain: There is no mass, hemorrhage or extra-axial collection. There is generalized atrophy without lobar predilection. There is hypoattenuation of the periventricular white matter, most commonly indicating chronic ischemic microangiopathy. Vascular: No abnormal hyperdensity of the major intracranial arteries or dural venous sinuses. No intracranial atherosclerosis. Skull: The visualized skull base, calvarium and extracranial soft tissues are normal. Ground-glass focus in the frontal calvarium, possibly fibrous dysplasia. Sinuses/Orbits: No fluid levels or advanced mucosal thickening of the visualized paranasal sinuses. No mastoid or middle ear effusion. The orbits are normal. CT CERVICAL SPINE FINDINGS Alignment: No static subluxation. Facets are aligned. Occipital condyles are normally  positioned. Skull base and vertebrae: No acute fracture. Soft tissues and spinal canal: No prevertebral fluid or swelling. No visible canal hematoma. Disc levels: No advanced spinal canal or neural foraminal stenosis. Upper chest: No pneumothorax, pulmonary nodule or pleural effusion. Other: Normal visualized paraspinal cervical soft tissues. IMPRESSION: 1. No acute intracranial abnormality. 2. Chronic ischemic microangiopathy and generalized atrophy. 3. No acute fracture or static subluxation of the cervical spine. Electronically Signed   By: Deatra Robinson M.D.   On: 09/24/2022 00:37   CT Cervical Spine Wo Contrast  Result Date: 09/24/2022 CLINICAL DATA:  Trauma EXAM: CT HEAD WITHOUT CONTRAST CT CERVICAL SPINE WITHOUT CONTRAST TECHNIQUE: Multidetector CT imaging of the head and cervical spine was performed following the standard protocol without intravenous contrast. Multiplanar CT image reconstructions of the cervical spine were also generated. RADIATION DOSE REDUCTION: This exam was performed according to the departmental dose-optimization program which includes automated exposure control, adjustment of the mA and/or kV according to patient size and/or use of iterative reconstruction technique. COMPARISON:  Head CT 09/10/2022 FINDINGS: CT HEAD FINDINGS Brain: There is no mass, hemorrhage or extra-axial collection. There is generalized atrophy without lobar predilection. There is hypoattenuation of the periventricular white matter, most commonly indicating chronic ischemic microangiopathy. Vascular: No abnormal hyperdensity of the major intracranial arteries or dural venous sinuses. No intracranial atherosclerosis. Skull: The visualized skull base, calvarium and extracranial soft tissues are normal. Ground-glass focus in the frontal calvarium, possibly fibrous dysplasia. Sinuses/Orbits: No fluid levels or advanced mucosal thickening of the visualized paranasal sinuses. No mastoid or middle ear effusion. The  orbits are normal. CT CERVICAL SPINE FINDINGS Alignment: No static subluxation. Facets are aligned. Occipital condyles are normally positioned. Skull base and vertebrae: No acute fracture. Soft tissues and spinal canal: No prevertebral fluid or swelling. No visible canal hematoma. Disc levels: No advanced spinal canal or neural foraminal stenosis. Upper chest: No pneumothorax, pulmonary nodule or pleural effusion. Other: Normal visualized paraspinal cervical soft tissues. IMPRESSION: 1. No acute intracranial abnormality. 2. Chronic ischemic microangiopathy and generalized atrophy. 3. No acute fracture or static subluxation of the cervical spine. Electronically Signed   By: Deatra Robinson M.D.   On: 09/24/2022 00:37   DG Hips Bilat W or Wo Pelvis 3-4 Views  Result Date: 09/24/2022 CLINICAL DATA:  Status post fall. EXAM: DG HIP (WITH OR WITHOUT PELVIS) 3-4V BILAT COMPARISON:  September 10, 2022 FINDINGS: There is no evidence of an acute hip fracture or dislocation. Moderate severity degenerative changes are seen involving the right hip, in the form of joint  space narrowing and acetabular sclerosis. IMPRESSION: Moderate severity degenerative changes involving the right hip without evidence of an acute osseous abnormality. Electronically Signed   By: Aram Candela M.D.   On: 09/24/2022 00:35   DG Chest Portable 1 View  Result Date: 09/24/2022 CLINICAL DATA:  Status post fall. EXAM: PORTABLE CHEST 1 VIEW COMPARISON:  Jun 05, 2022 FINDINGS: The cardiac silhouette is mildly enlarged and unchanged in size. There is marked severity calcification of the thoracic aorta. Mild, diffuse, chronic appearing increased lung markings are seen without evidence of acute infiltrate, pleural effusion or pneumothorax. No acute osseous abnormalities are identified. IMPRESSION: Chronic appearing increased lung markings without evidence of acute or active cardiopulmonary disease. Electronically Signed   By: Aram Candela M.D.   On:  09/24/2022 00:32    Procedures Procedures    Medications Ordered in ED Medications  lidocaine (LIDODERM) 5 % 2 patch (2 patches Transdermal Patch Applied 09/24/22 0119)  ketorolac (TORADOL) injection 30 mg (has no administration in time range)  acetaminophen (TYLENOL) tablet 1,000 mg (1,000 mg Oral Given 09/24/22 0119)    ED Course/ Medical Decision Making/ A&P                                 Medical Decision Making Patient had unwitnessed fall   Amount and/or Complexity of Data Reviewed Independent Historian: EMS    Details: See above  External Data Reviewed: notes.    Details: Previous notes reviewed, has ongoing right hip pain  Radiology: ordered and independent interpretation performed.    Details: Negative Head Ct, negative hip CT  Risk OTC drugs. Prescription drug management. Risk Details: Well appearing.  FROM.  Follow up with PMD.      Final Clinical Impression(s) / ED Diagnoses Final diagnoses:  Fall, initial encounter   Return for intractable cough, coughing up blood, fevers > 100.4 unrelieved by medication, shortness of breath, intractable vomiting, chest pain, shortness of breath, weakness, numbness, changes in speech, facial asymmetry, abdominal pain, passing out, Inability to tolerate liquids or food, cough, altered mental status or any concerns. No signs of systemic illness or infection. The patient is nontoxic-appearing on exam and vital signs are within normal limits.  I have reviewed the triage vital signs and the nursing notes. Pertinent labs & imaging results that were available during my care of the patient were reviewed by me and considered in my medical decision making (see chart for details). After history, exam, and medical workup I feel the patient has been appropriately medically screened and is safe for discharge home. Pertinent diagnoses were discussed with the patient. Patient was given return precautions. Rx / DC Orders ED Discharge Orders      None         Wynetta Seith, MD 09/24/22 1610

## 2022-09-24 NOTE — Progress Notes (Unsigned)
Location:   [facility] Room Number: 812 Place of Service:  ALF (13) Provider: Arna Snipe Meghen Akopyan NP  Venita Sheffield, MD  Patient Care Team: Venita Sheffield, MD as PCP - General (Internal Medicine) [name] Brysen Shankman X, NP as Nurse Practitioner (Nurse Practitioner) Nelson Chimes, MD as Consulting Physician (Ophthalmology) Marcene Corning, MD as Consulting Physician (Orthopedic Surgery) [place], [name], MD as Consulting Physician (Dermatology) Hart Carwin, MD (Inactive) as Consulting Physician (Gastroenterology) Ovidio Kin, MD as Consulting Physician (General Surgery)  Extended Emergency Contact Information Primary Emergency Contact: Cifelli,[name] Mobile Phone: [phone] Relation: Son Secondary Emergency Contact: Jimmey Ralph (POA),Debra          [place], [place] Mobile Phone: [phone] Relation: Daughter  Code Status: DNR Goals of care: Advanced Directive information    09/11/2022    4:03 PM  Advanced Directives  Does Patient Have a Medical Advance Directive? Yes  Type of Estate agent of [place];Out of facility DNR (pink MOST or yellow form)  Does patient want to make changes to medical advance directive? No - Patient declined  Copy of Healthcare Power of Attorney in Chart? Yes - validated most recent copy scanned in chart (See row information)  Pre-existing out of facility DNR order (yellow form or pink MOST form) Pink MOST form placed in chart (order not valid for inpatient use)     Chief Complaint  Patient presents with  . Acute Visit    F/u ED evaluation for fall.     HPI:  Pt is a 89 y.o. female seen today for an acute visit for 09/23/22 the patient was found sitting on the floor in front of recliner, note the right forehead hematoma, dorsum left hand bruise, ED CT head/cervical spine, R hip, CXR and X-ray hips/pelvis no acute process   09/10/22 Fall, unremarkable UA, X-ray L ankle, R  hip/pelvis, CT head CBC, CMP, except K 3.3, slightly elevated Bun/creat.                ED eval 06/05/22 for fall, left forehead hematoma. CT head, cervical spine, X-ray chest, left shoulder, pelvis, right tibia/fibula no acute process               Recurrent falls, ambulates with walker, unsteady gait.  lower back pain, R hip, L ankle, on Norco, Tramadol, Tylenol, 02/10/22 T11, L4 compression fx uncertain age.  OP, on Alendronate, Ca, Vit D Constipation, stable, on Mirlax, Senokot S Hx of syncope,  aortic valve stenosis CKD Na 139, K 4.2, Bun/creat 26/1.04 09/16/22 Hyperlipidemia, LDL 110 11/22/20, taking Omega 3 Hypothyroidism, taking Levothyroxine, TSH 2.25 03/20/22 Dementia, poor safety awareness,  no behavioral issues, on ASA 81mg               Anemia, Hgb 11.4 09/10/22.              Urinary frequency, OAB, not new, up to 3-4x per night, declined urology consultation             Gait abnormality, ambulates with walker, risk of falling.       Past Medical History:  Diagnosis Date  . Cerebral atrophy (HCC) 01/21/2013  . Hyperlipidemia   . Hypothyroid   . Osteoarthrosis, unspecified whether generalized or localized, unspecified site   . Osteoporosis, unspecified   . Other and unspecified hyperlipidemia   . Other generalized ischemic cerebrovascular disease 01/21/2013   small vessel disease  . Syncope and collapse   . Umbilical hernia without mention  of obstruction or gangrene   . Unspecified hearing loss   . Unspecified hypothyroidism   . Unspecified vitamin D deficiency   . Ventral hernia, unspecified, without mention of obstruction or gangrene    Past Surgical History:  Procedure Laterality Date  . CATARACT EXTRACTION W/ INTRAOCULAR LENS  IMPLANT, BILATERAL  2012   Dr. Hazle Quant  . HERNIA REPAIR  01/2008   laparoscopic Dr. Ovidio Kin  . INGUINAL HERNIA REPAIR N/A 05/10/2019   Procedure: LAPAROSCOPIC FEMORAL HERNIA REPAIR WITH MESH;  Surgeon: Axel Filler, MD;  Location: [facility]  OR;  Service: General;  Laterality: N/A;  . KNEE SURGERY  2008   Dr. Berneice Heinrich  left  . TOE AMPUTATION  1986   little toe right foot  Dr. Cliffton Asters; [place]    Allergies  Allergen Reactions  . Dust Mite Extract   . Mushroom Extract Complex Other (See Comments)    "Allergic," per paperwork from facility  . Penicillins Diarrhea and Other (See Comments)    "Allergic," per paperwork from facility    Allergies as of 09/24/2022       Reactions   Dust Mite Extract    Mushroom Extract Complex Other (See Comments)   "Allergic," per paperwork from facility   Penicillins Diarrhea, Other (See Comments)   "Allergic," per paperwork from facility        Medication List        Accurate as of September 24, 2022 11:59 PM. If you have any questions, ask your nurse or doctor.          acetaminophen 500 MG tablet Commonly known as: TYLENOL Take 2 tablets (1,000 mg total) by mouth every 6 (six) hours as needed.   Aspercreme Lidocaine 4 % Generic drug: lidocaine Place 1 patch onto the skin daily. Apply to SOLE OF RIGHT FOOT   aspirin 81 MG tablet Take 81 mg by mouth daily.   Calcium Carbonate-Vitamin D 600-200 MG-UNIT Tabs Take 1 tablet by mouth 2 (two) times daily.   Fish Oil 1200 MG Caps Take 1 capsule by mouth daily.   FLEET ENEMA RE Insert 1 unit rectally as needed for constipation repeat until positive results   Fosamax 70 MG tablet Generic drug: alendronate Take 70 mg by mouth once a week. related to AGE-RELATED OSTEOPOROSIS WITHOUT CURRENT PATHOLOGICAL FRACTURE (M81.0)   HYDROcodone-acetaminophen 5-325 MG tablet Commonly known as: NORCO/VICODIN Take 0.5 tablets by mouth 2 (two) times daily. Two listings per [employer] at [facility]   hydrocortisone 2.5 % rectal cream Commonly known as: Procto-Med HC Place 1 Application rectally as needed for hemorrhoids or anal itching.   levothyroxine 100 MCG tablet Commonly known as: SYNTHROID Take 100 mcg by mouth  daily.   multivitamin with minerals Tabs tablet Take 1 tablet by mouth daily.   sennosides-docusate sodium 8.6-50 MG tablet Commonly known as: SENOKOT-S Take 2 tablets by mouth 2 (two) times daily.        Review of Systems  Constitutional:  Negative for appetite change, fatigue and fever.  HENT:  Positive for hearing loss. Negative for congestion and voice change.   Respiratory:  Negative for cough.   Cardiovascular:  Negative for leg swelling.  Gastrointestinal:  Negative for abdominal pain.  Genitourinary:  Positive for frequency. Negative for dysuria and urgency.  Musculoskeletal:  Positive for arthralgias, back pain and gait problem.       Lower back pain, spinal spinous process tenderness palpated, pain worsens with movement.   Skin:  Right forehead hematoma, large bruise dorsum left hand  Neurological:  Negative for speech difficulty, weakness and light-headedness.       Memory lapses.   Psychiatric/Behavioral:  Negative for behavioral problems and sleep disturbance. The patient is not nervous/anxious.     Immunization History  Administered Date(s) Administered  . Influenza Whole 10/28/2011, 11/10/2012, 10/29/2017  . Influenza, High Dose Seasonal PF 11/05/2016, 11/11/2018, 11/12/2020  . Influenza-Unspecified 11/14/2013, 10/26/2014, 11/08/2015, 11/09/2019, 11/18/2021  . Moderna Sars-Covid-2 Vaccination 01/31/2019, 02/28/2019, 12/06/2019, 06/26/2020  . PFIZER(Purple Top)SARS-COV-2 Vaccination 10/16/2020  . PPD Test 02/27/2021  . Research officer, trade [employer] 52yrs & up 11/28/2021  . Pneumococcal Conjugate-13 02/05/2017  . Pneumococcal Polysaccharide-23 02/08/2007  . Td 01/28/1991, 06/12/2017  . Tdap 06/12/2017  . Zoster Recombinant(Shingrix) 07/09/2017, 09/15/2017  . Zoster, Live 12/27/2012   Pertinent  Health Maintenance Due  Topic Date Due  . INFLUENZA VACCINE  08/28/2022  . DEXA SCAN  Completed      09/02/2019   12:05 PM 04/20/2020   10:57 AM  02/15/2021   12:46 PM 02/16/2021    2:41 AM 02/18/2022   10:36 AM  Fall Risk  Falls in the past year? 1 0   1  Was there an injury with Fall? 1    1  Fall Risk Category Calculator 2    3  Fall Risk Category (Retired) Moderate      (RETIRED) Patient Fall Risk Level   Low fall risk High fall risk   Patient at Risk for Falls Due to     History of fall(s);Impaired balance/gait;Impaired mobility  Fall risk Follow up     Education provided   Functional Status Survey:    Vitals:   09/24/22 1150  BP: 130/70  Pulse: 86  Resp: 16  Temp: 97.9 F (36.6 C)  SpO2: 95%  Weight: 121 lb 9.6 oz (55.2 kg)   Body mass index is 25.41 kg/m. Physical Exam Constitutional:      Appearance: Normal appearance.  HENT:     Head: Normocephalic and atraumatic.     Mouth/Throat:     Mouth: Mucous membranes are moist.  Eyes:     Extraocular Movements: Extraocular movements intact.     Conjunctiva/sclera: Conjunctivae normal.     Pupils: Pupils are equal, round, and reactive to light.  Cardiovascular:     Rate and Rhythm: Normal rate and regular rhythm.     Heart sounds: Murmur heard.  Pulmonary:     Effort: Pulmonary effort is normal.     Breath sounds: Normal breath sounds. No rales.  Abdominal:     General: Bowel sounds are normal. There is no distension.     Palpations: Abdomen is soft.     Tenderness: There is no abdominal tenderness. There is no right CVA tenderness, left CVA tenderness, guarding or rebound.  Musculoskeletal:        General: Swelling, tenderness, deformity and signs of injury present.     Cervical back: Normal range of motion and neck supple.     Right lower leg: No edema.     Left lower leg: No edema.     Comments: Lower back pain, positional and with movement spinal spinous process ?L1, L2, tenderness palpated.  R shoulder pain, chronic, better  Skin:    General: Skin is warm and dry.     Findings: Bruising present.     Comments: Right forehead hematoma, large bruise  dorsum left hand  Neurological:     General: No focal deficit  present.     Mental Status: She is alert and oriented to person, place, and time. Mental status is at baseline.     Gait: Gait abnormal.  Psychiatric:        Mood and Affect: Mood normal.        Behavior: Behavior normal.        Thought Content: Thought content normal.    Labs reviewed: Recent Labs    02/10/22 0000 03/20/22 0000 06/05/22 1715 09/10/22 1710  NA 133* 139 139 138  K 4.7 4.4 3.8 3.4*  CL 101 106 106 103  CO2 23* 23* 22 26  GLUCOSE  --   --  118* 107*  BUN 18 12 17  26*  CREATININE 1.0 1.1 0.99 1.09*  CALCIUM 9.1  --  9.3 9.4   Recent Labs    02/10/22 0000 06/05/22 1715 09/10/22 1710  AST 21 22 21   ALT 13 15 16   ALKPHOS 70 69 65  BILITOT  --  0.5 0.3  PROT  --  7.4 7.5  ALBUMIN 3.9 3.8 3.9   Recent Labs    03/20/22 0000 06/05/22 1715 09/10/22 1710  WBC 6.3 8.6 9.4  NEUTROABS 3,805.00 5.7 6.5  HGB 12.5 11.8* 11.4*  HCT 37 37.9 36.2  MCV  --  93.6 92.6  PLT 397 347 347   Lab Results  Component Value Date   TSH 2.25 03/20/2022   Lab Results  Component Value Date   HGBA1C 5.9 (H) 01/31/2020   Lab Results  Component Value Date   CHOL 205 (H) 11/22/2020   HDL 65 11/22/2020   LDLCALC 110 (H) 11/22/2020   TRIG 180 (H) 11/22/2020   CHOLHDL 3.2 11/22/2020    Significant Diagnostic Results in last 30 days:  CT Hip Right Wo Contrast  Result Date: 09/24/2022 CLINICAL DATA:  Trauma to the right hip.  Concern for fracture. EXAM: CT OF THE RIGHT HIP WITHOUT CONTRAST TECHNIQUE: Multidetector CT imaging of the right hip was performed according to the standard protocol. Multiplanar CT image reconstructions were also generated. RADIATION DOSE REDUCTION: This exam was performed according to the departmental dose-optimization program which includes automated exposure control, adjustment of the mA and/or kV according to patient size and/or use of iterative reconstruction technique. COMPARISON:   Radiograph dated 09/24/2022. FINDINGS: Bones/Joint/Cartilage There is no acute fracture or dislocation. The bones are osteopenic. Severe arthritic changes of the right hip with subcortical cystic changes of the femoral head and acetabulum. There is narrowing of the joint space with near bone on bone contact. No effusion. Ligaments Suboptimally assessed by CT. Muscles and Tendons No intramuscular fluid collection or hematoma. Soft tissues Contusion of the subcutaneous soft tissues of the lateral hip. No large hematoma or fluid collection. IMPRESSION: 1. No acute fracture or dislocation. 2. Severe arthritic changes of the right hip. Electronically Signed   By: Elgie Collard M.D.   On: 09/24/2022 01:54   CT Head Wo Contrast  Result Date: 09/24/2022 CLINICAL DATA:  Trauma EXAM: CT HEAD WITHOUT CONTRAST CT CERVICAL SPINE WITHOUT CONTRAST TECHNIQUE: Multidetector CT imaging of the head and cervical spine was performed following the standard protocol without intravenous contrast. Multiplanar CT image reconstructions of the cervical spine were also generated. RADIATION DOSE REDUCTION: This exam was performed according to the departmental dose-optimization program which includes automated exposure control, adjustment of the mA and/or kV according to patient size and/or use of iterative reconstruction technique. COMPARISON:  Head CT 09/10/2022 FINDINGS: CT HEAD FINDINGS Brain: There is  no mass, hemorrhage or extra-axial collection. There is generalized atrophy without lobar predilection. There is hypoattenuation of the periventricular white matter, most commonly indicating chronic ischemic microangiopathy. Vascular: No abnormal hyperdensity of the major intracranial arteries or dural venous sinuses. No intracranial atherosclerosis. Skull: The visualized skull base, calvarium and extracranial soft tissues are normal. Ground-glass focus in the frontal calvarium, possibly fibrous dysplasia. Sinuses/Orbits: No fluid levels  or advanced mucosal thickening of the visualized paranasal sinuses. No mastoid or middle ear effusion. The orbits are normal. CT CERVICAL SPINE FINDINGS Alignment: No static subluxation. Facets are aligned. Occipital condyles are normally positioned. Skull base and vertebrae: No acute fracture. Soft tissues and spinal canal: No prevertebral fluid or swelling. No visible canal hematoma. Disc levels: No advanced spinal canal or neural foraminal stenosis. Upper chest: No pneumothorax, pulmonary nodule or pleural effusion. Other: Normal visualized paraspinal cervical soft tissues. IMPRESSION: 1. No acute intracranial abnormality. 2. Chronic ischemic microangiopathy and generalized atrophy. 3. No acute fracture or static subluxation of the cervical spine. Electronically Signed   By: Deatra Robinson M.D.   On: 09/24/2022 00:37   CT Cervical Spine Wo Contrast  Result Date: 09/24/2022 CLINICAL DATA:  Trauma EXAM: CT HEAD WITHOUT CONTRAST CT CERVICAL SPINE WITHOUT CONTRAST TECHNIQUE: Multidetector CT imaging of the head and cervical spine was performed following the standard protocol without intravenous contrast. Multiplanar CT image reconstructions of the cervical spine were also generated. RADIATION DOSE REDUCTION: This exam was performed according to the departmental dose-optimization program which includes automated exposure control, adjustment of the mA and/or kV according to patient size and/or use of iterative reconstruction technique. COMPARISON:  Head CT 09/10/2022 FINDINGS: CT HEAD FINDINGS Brain: There is no mass, hemorrhage or extra-axial collection. There is generalized atrophy without lobar predilection. There is hypoattenuation of the periventricular white matter, most commonly indicating chronic ischemic microangiopathy. Vascular: No abnormal hyperdensity of the major intracranial arteries or dural venous sinuses. No intracranial atherosclerosis. Skull: The visualized skull base, calvarium and extracranial  soft tissues are normal. Ground-glass focus in the frontal calvarium, possibly fibrous dysplasia. Sinuses/Orbits: No fluid levels or advanced mucosal thickening of the visualized paranasal sinuses. No mastoid or middle ear effusion. The orbits are normal. CT CERVICAL SPINE FINDINGS Alignment: No static subluxation. Facets are aligned. Occipital condyles are normally positioned. Skull base and vertebrae: No acute fracture. Soft tissues and spinal canal: No prevertebral fluid or swelling. No visible canal hematoma. Disc levels: No advanced spinal canal or neural foraminal stenosis. Upper chest: No pneumothorax, pulmonary nodule or pleural effusion. Other: Normal visualized paraspinal cervical soft tissues. IMPRESSION: 1. No acute intracranial abnormality. 2. Chronic ischemic microangiopathy and generalized atrophy. 3. No acute fracture or static subluxation of the cervical spine. Electronically Signed   By: Deatra Robinson M.D.   On: 09/24/2022 00:37   DG Hips Bilat W or Wo Pelvis 3-4 Views  Result Date: 09/24/2022 CLINICAL DATA:  Status post fall. EXAM: DG HIP (WITH OR WITHOUT PELVIS) 3-4V BILAT COMPARISON:  September 10, 2022 FINDINGS: There is no evidence of an acute hip fracture or dislocation. Moderate severity degenerative changes are seen involving the right hip, in the form of joint space narrowing and acetabular sclerosis. IMPRESSION: Moderate severity degenerative changes involving the right hip without evidence of an acute osseous abnormality. Electronically Signed   By: Aram Candela M.D.   On: 09/24/2022 00:35   DG Chest Portable 1 View  Result Date: 09/24/2022 CLINICAL DATA:  Status post fall. EXAM: PORTABLE CHEST 1 VIEW COMPARISON:  Jun 05, 2022 FINDINGS: The cardiac silhouette is mildly enlarged and unchanged in size. There is marked severity calcification of the thoracic aorta. Mild, diffuse, chronic appearing increased lung markings are seen without evidence of acute infiltrate, pleural  effusion or pneumothorax. No acute osseous abnormalities are identified. IMPRESSION: Chronic appearing increased lung markings without evidence of acute or active cardiopulmonary disease. Electronically Signed   By: Aram Candela M.D.   On: 09/24/2022 00:32   CT Head Wo Contrast  Result Date: 09/10/2022 CLINICAL DATA:  Head trauma, minor (Age >= 65y) EXAM: CT HEAD WITHOUT CONTRAST TECHNIQUE: Contiguous axial images were obtained from the base of the skull through the vertex without intravenous contrast. RADIATION DOSE REDUCTION: This exam was performed according to the departmental dose-optimization program which includes automated exposure control, adjustment of the mA and/or kV according to patient size and/or use of iterative reconstruction technique. COMPARISON:  CT head May 9, 24. FINDINGS: Brain: No evidence of acute infarction, hemorrhage, hydrocephalus, extra-axial collection or mass lesion/mass effect. Vascular: No hyperdense vessel identified Skull: No acute fracture. Sinuses/Orbits: Clear sinuses.  No acute orbital findings. Other: No mastoid effusions. IMPRESSION: No evidence of acute intracranial abnormality. Electronically Signed   By: Feliberto Harts M.D.   On: 09/10/2022 16:45   DG Ankle Complete Left  Result Date: 09/10/2022 CLINICAL DATA:  Unwitnessed fall.  Hip and leg pain. EXAM: LEFT ANKLE COMPLETE - 3+ VIEW COMPARISON:  None available FINDINGS: Diffuse osteopenia. Talar dome is intact. No fracture or dislocation. IMPRESSION: No acute abnormality of the left ankle. Electronically Signed   By: Acquanetta Belling M.D.   On: 09/10/2022 16:41   DG Hip Unilat W or Wo Pelvis 2-3 Views Right  Result Date: 09/10/2022 CLINICAL DATA:  Unwitnessed fall.  Neck, hip, and leg pain. EXAM: DG HIP (WITH OR WITHOUT PELVIS) 2-3V RIGHT COMPARISON:  06/05/2022 FINDINGS: No fracture or dislocation of the right hip. Moderate degenerative changes of the right hip. Limited cross-table lateral view. Advanced  degenerative changes of the lumbar spine partially visualized. IMPRESSION: No fracture or dislocation of the right hip. If the patient is unable to bear weight or has significant pain, consider CT or MRI of the right hip. Electronically Signed   By: Acquanetta Belling M.D.   On: 09/10/2022 16:40    Assessment/Plan: Multiple bruises  right forehead hematoma, dorsum left hand bruise, ED CT head/cervical spine, R hip, CXR and X-ray hips/pelvis no acute process  Fall Frequent falls, lack of safety awareness and increased frailty are contributory, needs higher level care for safety. Update CBC/diff, CMP/eGFR in am  Constipation stable, on Mirlax, Senokot S  Aortic valve stenosis Hx of syncope,  aortic valve stenosis  Stage 3a chronic kidney disease (HCC)  Na 139, K 4.2, Bun/creat 26/1.04 09/16/22  Mixed hyperlipidemia LDL 110 11/22/20, taking Omega 3  Hypothyroidism  taking Levothyroxine, TSH 2.25 03/20/22  Senile dementia (HCC) poor safety awareness,  no behavioral issues, on ASA 81mg    Abnormality of gait  ambulates with walker, risk of falling.     Family/ staff Communication: plan of care reviewed with the patient and charge nurse.   Labs/tests ordered:  CBC/diff, CMP/eGFR  Time spend 40 minutes.

## 2022-09-24 NOTE — ED Triage Notes (Signed)
PT BIB EMS for unwitnessed fall. Hematoma noted to the R forehead. Pt complaining of R hip and knee pain. VSS. Dementia at baseline. - thinners

## 2022-09-24 NOTE — Assessment & Plan Note (Signed)
Frequent falls, lack of safety awareness and increased frailty are contributory, needs higher level care for safety. Update CBC/diff, CMP/eGFR in am

## 2022-09-24 NOTE — Assessment & Plan Note (Signed)
Na 139, K 4.2, Bun/creat 26/1.04 09/16/22

## 2022-09-25 DIAGNOSIS — M545 Low back pain, unspecified: Secondary | ICD-10-CM | POA: Diagnosis not present

## 2022-09-25 DIAGNOSIS — N189 Chronic kidney disease, unspecified: Secondary | ICD-10-CM | POA: Diagnosis not present

## 2022-09-25 DIAGNOSIS — M25559 Pain in unspecified hip: Secondary | ICD-10-CM | POA: Diagnosis not present

## 2022-09-25 LAB — CBC AND DIFFERENTIAL
HCT: 28 — AB (ref 36–46)
Hemoglobin: 9.1 — AB (ref 12.0–16.0)
Neutrophils Absolute: 4448
Platelets: 402 10*3/uL — AB (ref 150–400)
WBC: 6.3

## 2022-09-25 LAB — BASIC METABOLIC PANEL
BUN: 25 — AB (ref 4–21)
CO2: 25 — AB (ref 13–22)
Chloride: 102 (ref 99–108)
Creatinine: 1.6 — AB (ref 0.5–1.1)
Glucose: 86
Potassium: 4.4 meq/L (ref 3.5–5.1)
Sodium: 136 — AB (ref 137–147)

## 2022-09-25 LAB — HEPATIC FUNCTION PANEL
ALT: 15 U/L (ref 7–35)
AST: 22 (ref 13–35)
Alkaline Phosphatase: 72 (ref 25–125)
Bilirubin, Total: 0.5

## 2022-09-25 LAB — COMPREHENSIVE METABOLIC PANEL
Albumin: 3.7 (ref 3.5–5.0)
Calcium: 9.2 (ref 8.7–10.7)
Globulin: 2.8
eGFR: 31

## 2022-09-25 LAB — CBC: RBC: 3.1 — AB (ref 3.87–5.11)

## 2022-09-30 ENCOUNTER — Encounter: Payer: Self-pay | Admitting: Nurse Practitioner

## 2022-09-30 ENCOUNTER — Non-Acute Institutional Stay: Payer: PPO | Admitting: Nurse Practitioner

## 2022-09-30 DIAGNOSIS — K5900 Constipation, unspecified: Secondary | ICD-10-CM | POA: Diagnosis not present

## 2022-09-30 DIAGNOSIS — E039 Hypothyroidism, unspecified: Secondary | ICD-10-CM | POA: Diagnosis not present

## 2022-09-30 DIAGNOSIS — N1831 Chronic kidney disease, stage 3a: Secondary | ICD-10-CM

## 2022-09-30 DIAGNOSIS — R350 Frequency of micturition: Secondary | ICD-10-CM | POA: Diagnosis not present

## 2022-09-30 DIAGNOSIS — F0390 Unspecified dementia without behavioral disturbance: Secondary | ICD-10-CM

## 2022-09-30 DIAGNOSIS — Z66 Do not resuscitate: Secondary | ICD-10-CM

## 2022-09-30 DIAGNOSIS — D638 Anemia in other chronic diseases classified elsewhere: Secondary | ICD-10-CM | POA: Diagnosis not present

## 2022-09-30 DIAGNOSIS — M85852 Other specified disorders of bone density and structure, left thigh: Secondary | ICD-10-CM

## 2022-09-30 NOTE — Assessment & Plan Note (Signed)
 taking Levothyroxine, TSH 2.25 03/20/22

## 2022-09-30 NOTE — Assessment & Plan Note (Signed)
stable, on Mirlax, Senokot S

## 2022-09-30 NOTE — Progress Notes (Signed)
Location:  [name] Room Number: AL [id number] Place of Service:  ALF (13) Provider:  Chipper Oman, NP   Patient Care Team: Venita Sheffield, MD as PCP - General (Internal Medicine) [name] Johnathon Mittal X, NP as Nurse Practitioner (Nurse Practitioner) Nelson Chimes, MD as Consulting Physician (Ophthalmology) Marcene Corning, MD as Consulting Physician (Orthopedic Surgery) [place], [name], MD as Consulting Physician (Dermatology) Hart Carwin, MD (Inactive) as Consulting Physician (Gastroenterology) Ovidio Kin, MD as Consulting Physician (General Surgery)  Extended Emergency Contact Information Primary Emergency Contact: Kundinger,[name] Mobile Phone: [phone] Relation: Son Secondary Emergency Contact: Jimmey Ralph (POA),Debra          [place], [place] Mobile Phone: [phone] Relation: Daughter  Code Status:  DNR Goals of care: Advanced Directive information    09/30/2022   12:03 PM  Advanced Directives  Does Patient Have a Medical Advance Directive? Yes  Type of Estate agent of [place];Living will;Out of facility DNR (pink MOST or yellow form)  Does patient want to make changes to medical advance directive? No - Patient declined  Copy of Healthcare Power of Attorney in Chart? Yes - validated most recent copy scanned in chart (See row information)  Pre-existing out of facility DNR order (yellow form or pink MOST form) Pink MOST form placed in chart (order not valid for inpatient use)     Chief Complaint  Patient presents with   Acute Visit    Anemia     HPI:  Pt is a 89 y.o. female seen today for an acute visit for CKD Bun/creat 25/1.57, anemia, Hgb 9.1, no apparent s/s of bleeding. Denied abd pain, indigestion.   09/23/22 the patient was found sitting on the floor in front of recliner, note the right forehead hematoma, dorsum left hand bruise, ED CT head/cervical spine, R hip, CXR and X-ray  hips/pelvis no acute process              09/10/22 Fall, unremarkable UA, X-ray L ankle, R hip/pelvis, CT head CBC, CMP, except K 3.3, slightly elevated Bun/creat.                 ED eval 06/05/22 for fall, left forehead hematoma. CT head, cervical spine, X-ray chest, left shoulder, pelvis, right tibia/fibula no acute process                Recurrent falls, ambulates with walker, unsteady gait.   lower back pain, R hip, L ankle, on Norco, Tramadol, Tylenol, 02/10/22 T11, L4 compression fx uncertain age.  OP, on Alendronate, Ca, Vit D Constipation, stable, on Mirlax, Senokot S Hx of syncope,  aortic valve stenosis CKD Na 139, K 4.2, Bun/creat 26/1.04 09/16/22, Na 136, K 4.4, Bun 25, creat 1.57 09/25/22 Hyperlipidemia, LDL 110 11/22/20, taking Omega 3 Hypothyroidism, taking Levothyroxine, TSH 2.25 03/20/22 Dementia, poor safety awareness,  no behavioral issues, on ASA 81mg               Anemia, Hgb 11.4 09/10/22. 9.1 09/25/22             Urinary frequency, OAB, not new, up to 3-4x per night, declined urology consultation             Gait abnormality, ambulates with walker, risk of falling.      Past Medical History:  Diagnosis Date   Cerebral atrophy (HCC) 01/21/2013   Hyperlipidemia    Hypothyroid    Osteoarthrosis, unspecified whether generalized or localized, unspecified site  Osteoporosis, unspecified    Other and unspecified hyperlipidemia    Other generalized ischemic cerebrovascular disease 01/21/2013   small vessel disease   Syncope and collapse    Umbilical hernia without mention of obstruction or gangrene    Unspecified hearing loss    Unspecified hypothyroidism    Unspecified vitamin D deficiency    Ventral hernia, unspecified, without mention of obstruction or gangrene    Past Surgical History:  Procedure Laterality Date   CATARACT EXTRACTION W/ INTRAOCULAR LENS  IMPLANT, BILATERAL  2012   Dr. Hazle Quant   HERNIA REPAIR  01/2008   laparoscopic Dr. Ovidio Kin   INGUINAL  HERNIA REPAIR N/A 05/10/2019   Procedure: LAPAROSCOPIC FEMORAL HERNIA REPAIR WITH MESH;  Surgeon: Axel Filler, MD;  Location: [facility] OR;  Service: General;  Laterality: N/A;   KNEE SURGERY  2008   Dr. Berneice Heinrich  left   TOE AMPUTATION  1986   little toe right foot  Dr. Cliffton Asters; [place]    Allergies  Allergen Reactions   Dust Mite Extract    Mushroom Extract Complex Other (See Comments)    "Allergic," per paperwork from facility   Penicillins Diarrhea and Other (See Comments)    "Allergic," per paperwork from facility    Outpatient Encounter Medications as of 09/30/2022  Medication Sig   acetaminophen (TYLENOL) 500 MG tablet Take 2 tablets (1,000 mg total) by mouth every 6 (six) hours as needed.   alendronate (FOSAMAX) 70 MG tablet Take 70 mg by mouth once a week. related to AGE-RELATED OSTEOPOROSIS WITHOUT CURRENT PATHOLOGICAL FRACTURE (M81.0)   aspirin 81 MG tablet Take 81 mg by mouth daily.   Calcium Carbonate-Vitamin D 600-200 MG-UNIT TABS Take 1 tablet by mouth 2 (two) times daily.   HYDROcodone-acetaminophen (NORCO/VICODIN) 5-325 MG tablet Take 0.5 tablets by mouth 2 (two) times daily. Two listings per [employer] at [facility]   hydrocortisone (PROCTO-MED [facility]) 2.5 % rectal cream Place 1 Application rectally as needed for hemorrhoids or anal itching.   levothyroxine (SYNTHROID) 100 MCG tablet Take 100 mcg by mouth daily.   lidocaine (ASPERCREME LIDOCAINE) 4 % Place 1 patch onto the skin daily. Apply to SOLE OF RIGHT FOOT   Multiple Vitamin (MULTIVITAMIN WITH MINERALS) TABS Take 1 tablet by mouth daily.   Omega-3 Fatty Acids (FISH OIL) 1200 MG CAPS Take 1 capsule by mouth daily.   sennosides-docusate sodium (SENOKOT-S) 8.6-50 MG tablet Take 1 tablet by mouth daily.   Sodium Phosphates (FLEET ENEMA RE) Insert 1 unit rectally as needed for constipation repeat until positive results   Zinc Oxide 10 % OINT Apply 1 Application topically as directed. Every shift to buttocks  for redness   No facility-administered encounter medications on file as of 09/30/2022.    Review of Systems  Constitutional:  Negative for appetite change, fatigue and fever.  HENT:  Positive for hearing loss. Negative for congestion and voice change.   Respiratory:  Negative for cough.   Cardiovascular:  Negative for leg swelling.  Gastrointestinal:  Negative for abdominal pain.  Genitourinary:  Positive for frequency. Negative for dysuria and urgency.  Musculoskeletal:  Positive for arthralgias, back pain and gait problem.       Lower back pain, spinal spinous process tenderness palpated, pain worsens with movement.   Skin:        Right forehead hematoma, large bruise dorsum left hand  Neurological:  Negative for speech difficulty, weakness and light-headedness.       Memory lapses.  Psychiatric/Behavioral:  Negative for behavioral problems and sleep disturbance. The patient is not nervous/anxious.     Immunization History  Administered Date(s) Administered   Influenza Whole 10/28/2011, 11/10/2012, 10/29/2017   Influenza, High Dose Seasonal PF 11/05/2016, 11/11/2018, 11/12/2020   Influenza-Unspecified 11/14/2013, 10/26/2014, 11/08/2015, 11/09/2019, 11/18/2021   Moderna Sars-Covid-2 Vaccination 01/31/2019, 02/28/2019, 12/06/2019, 06/26/2020   PFIZER(Purple Top)SARS-COV-2 Vaccination 10/16/2020   PPD Test 02/27/2021   Pfizer Covid-19 Vaccine Bivalent Booster 37yrs & up 11/28/2021   Pneumococcal Conjugate-13 02/05/2017   Pneumococcal Polysaccharide-23 02/08/2007   Td 01/28/1991, 06/12/2017   Tdap 06/12/2017   Zoster Recombinant(Shingrix) 07/09/2017, 09/15/2017   Zoster, Live 12/27/2012   Pertinent  Health Maintenance Due  Topic Date Due   INFLUENZA VACCINE  08/28/2022   DEXA SCAN  Completed      09/02/2019   12:05 PM 04/20/2020   10:57 AM 02/15/2021   12:46 PM 02/16/2021    2:41 AM 02/18/2022   10:36 AM  Fall Risk  Falls in the past year? 1 0   1  Was there an injury with  Fall? 1    1  Fall Risk Category Calculator 2    3  Fall Risk Category (Retired) Moderate      (RETIRED) Patient Fall Risk Level   Low fall risk High fall risk   Patient at Risk for Falls Due to     History of fall(s);Impaired balance/gait;Impaired mobility  Fall risk Follow up     Education provided   Functional Status Survey:    Vitals:   09/30/22 1109 09/30/22 1202  BP: 113/65 (!) 122/48  Pulse:  90  Resp:  16  Temp:  97.9 F (36.6 C)  SpO2:  94%  Weight:  122 lb (55.3 kg)  Height:  4\' 10"  (1.473 m)   Body mass index is 25.5 kg/m. Physical Exam Constitutional:      Appearance: Normal appearance.  HENT:     Head: Normocephalic and atraumatic.     Mouth/Throat:     Mouth: Mucous membranes are moist.  Eyes:     Extraocular Movements: Extraocular movements intact.     Conjunctiva/sclera: Conjunctivae normal.     Pupils: Pupils are equal, round, and reactive to light.  Cardiovascular:     Rate and Rhythm: Normal rate and regular rhythm.     Heart sounds: Murmur heard.  Pulmonary:     Effort: Pulmonary effort is normal.     Breath sounds: Normal breath sounds. No rales.  Abdominal:     General: Bowel sounds are normal. There is no distension.     Palpations: Abdomen is soft.     Tenderness: There is no abdominal tenderness. There is no right CVA tenderness, left CVA tenderness, guarding or rebound.  Musculoskeletal:        General: Swelling, tenderness, deformity and signs of injury present.     Cervical back: Normal range of motion and neck supple.     Right lower leg: No edema.     Left lower leg: No edema.     Comments: Lower back pain, positional and with movement spinal spinous process ?L1, L2, tenderness palpated.  R shoulder pain, chronic, better  Skin:    General: Skin is warm and dry.     Findings: Bruising present.     Comments: Right forehead hematoma, large bruise dorsum left hand  Neurological:     General: No focal deficit present.     Mental  Status: She is alert and oriented to person,  place, and time. Mental status is at baseline.     Gait: Gait abnormal.  Psychiatric:        Mood and Affect: Mood normal.        Behavior: Behavior normal.        Thought Content: Thought content normal.     Labs reviewed: Recent Labs    06/05/22 1715 09/10/22 1710 09/16/22 0000  NA 139 138 139  K 3.8 3.4* 4.2  CL 106 103 105  CO2 22 26 23*  GLUCOSE 118* 107*  --   BUN 17 26* 26*  CREATININE 0.99 1.09* 1.0  CALCIUM 9.3 9.4 8.9   Recent Labs    02/10/22 0000 06/05/22 1715 09/10/22 1710  AST 21 22 21   ALT 13 15 16   ALKPHOS 70 69 65  BILITOT  --  0.5 0.3  PROT  --  7.4 7.5  ALBUMIN 3.9 3.8 3.9   Recent Labs    03/20/22 0000 06/05/22 1715 09/10/22 1710  WBC 6.3 8.6 9.4  NEUTROABS 3,805.00 5.7 6.5  HGB 12.5 11.8* 11.4*  HCT 37 37.9 36.2  MCV  --  93.6 92.6  PLT 397 347 347   Lab Results  Component Value Date   TSH 2.25 03/20/2022   Lab Results  Component Value Date   HGBA1C 5.9 (H) 01/31/2020   Lab Results  Component Value Date   CHOL 205 (H) 11/22/2020   HDL 65 11/22/2020   LDLCALC 110 (H) 11/22/2020   TRIG 180 (H) 11/22/2020   CHOLHDL 3.2 11/22/2020    Significant Diagnostic Results in last 30 days:  CT Hip Right Wo Contrast  Result Date: 09/24/2022 CLINICAL DATA:  Trauma to the right hip.  Concern for fracture. EXAM: CT OF THE RIGHT HIP WITHOUT CONTRAST TECHNIQUE: Multidetector CT imaging of the right hip was performed according to the standard protocol. Multiplanar CT image reconstructions were also generated. RADIATION DOSE REDUCTION: This exam was performed according to the departmental dose-optimization program which includes automated exposure control, adjustment of the mA and/or kV according to patient size and/or use of iterative reconstruction technique. COMPARISON:  Radiograph dated 09/24/2022. FINDINGS: Bones/Joint/Cartilage There is no acute fracture or dislocation. The bones are osteopenic.  Severe arthritic changes of the right hip with subcortical cystic changes of the femoral head and acetabulum. There is narrowing of the joint space with near bone on bone contact. No effusion. Ligaments Suboptimally assessed by CT. Muscles and Tendons No intramuscular fluid collection or hematoma. Soft tissues Contusion of the subcutaneous soft tissues of the lateral hip. No large hematoma or fluid collection. IMPRESSION: 1. No acute fracture or dislocation. 2. Severe arthritic changes of the right hip. Electronically Signed   By: Elgie Collard M.D.   On: 09/24/2022 01:54   CT Head Wo Contrast  Result Date: 09/24/2022 CLINICAL DATA:  Trauma EXAM: CT HEAD WITHOUT CONTRAST CT CERVICAL SPINE WITHOUT CONTRAST TECHNIQUE: Multidetector CT imaging of the head and cervical spine was performed following the standard protocol without intravenous contrast. Multiplanar CT image reconstructions of the cervical spine were also generated. RADIATION DOSE REDUCTION: This exam was performed according to the departmental dose-optimization program which includes automated exposure control, adjustment of the mA and/or kV according to patient size and/or use of iterative reconstruction technique. COMPARISON:  Head CT 09/10/2022 FINDINGS: CT HEAD FINDINGS Brain: There is no mass, hemorrhage or extra-axial collection. There is generalized atrophy without lobar predilection. There is hypoattenuation of the periventricular white matter, most commonly indicating chronic ischemic microangiopathy.  Vascular: No abnormal hyperdensity of the major intracranial arteries or dural venous sinuses. No intracranial atherosclerosis. Skull: The visualized skull base, calvarium and extracranial soft tissues are normal. Ground-glass focus in the frontal calvarium, possibly fibrous dysplasia. Sinuses/Orbits: No fluid levels or advanced mucosal thickening of the visualized paranasal sinuses. No mastoid or middle ear effusion. The orbits are normal. CT  CERVICAL SPINE FINDINGS Alignment: No static subluxation. Facets are aligned. Occipital condyles are normally positioned. Skull base and vertebrae: No acute fracture. Soft tissues and spinal canal: No prevertebral fluid or swelling. No visible canal hematoma. Disc levels: No advanced spinal canal or neural foraminal stenosis. Upper chest: No pneumothorax, pulmonary nodule or pleural effusion. Other: Normal visualized paraspinal cervical soft tissues. IMPRESSION: 1. No acute intracranial abnormality. 2. Chronic ischemic microangiopathy and generalized atrophy. 3. No acute fracture or static subluxation of the cervical spine. Electronically Signed   By: Deatra Robinson M.D.   On: 09/24/2022 00:37   CT Cervical Spine Wo Contrast  Result Date: 09/24/2022 CLINICAL DATA:  Trauma EXAM: CT HEAD WITHOUT CONTRAST CT CERVICAL SPINE WITHOUT CONTRAST TECHNIQUE: Multidetector CT imaging of the head and cervical spine was performed following the standard protocol without intravenous contrast. Multiplanar CT image reconstructions of the cervical spine were also generated. RADIATION DOSE REDUCTION: This exam was performed according to the departmental dose-optimization program which includes automated exposure control, adjustment of the mA and/or kV according to patient size and/or use of iterative reconstruction technique. COMPARISON:  Head CT 09/10/2022 FINDINGS: CT HEAD FINDINGS Brain: There is no mass, hemorrhage or extra-axial collection. There is generalized atrophy without lobar predilection. There is hypoattenuation of the periventricular white matter, most commonly indicating chronic ischemic microangiopathy. Vascular: No abnormal hyperdensity of the major intracranial arteries or dural venous sinuses. No intracranial atherosclerosis. Skull: The visualized skull base, calvarium and extracranial soft tissues are normal. Ground-glass focus in the frontal calvarium, possibly fibrous dysplasia. Sinuses/Orbits: No fluid levels  or advanced mucosal thickening of the visualized paranasal sinuses. No mastoid or middle ear effusion. The orbits are normal. CT CERVICAL SPINE FINDINGS Alignment: No static subluxation. Facets are aligned. Occipital condyles are normally positioned. Skull base and vertebrae: No acute fracture. Soft tissues and spinal canal: No prevertebral fluid or swelling. No visible canal hematoma. Disc levels: No advanced spinal canal or neural foraminal stenosis. Upper chest: No pneumothorax, pulmonary nodule or pleural effusion. Other: Normal visualized paraspinal cervical soft tissues. IMPRESSION: 1. No acute intracranial abnormality. 2. Chronic ischemic microangiopathy and generalized atrophy. 3. No acute fracture or static subluxation of the cervical spine. Electronically Signed   By: Deatra Robinson M.D.   On: 09/24/2022 00:37   DG Hips Bilat W or Wo Pelvis 3-4 Views  Result Date: 09/24/2022 CLINICAL DATA:  Status post fall. EXAM: DG HIP (WITH OR WITHOUT PELVIS) 3-4V BILAT COMPARISON:  September 10, 2022 FINDINGS: There is no evidence of an acute hip fracture or dislocation. Moderate severity degenerative changes are seen involving the right hip, in the form of joint space narrowing and acetabular sclerosis. IMPRESSION: Moderate severity degenerative changes involving the right hip without evidence of an acute osseous abnormality. Electronically Signed   By: Aram Candela M.D.   On: 09/24/2022 00:35   DG Chest Portable 1 View  Result Date: 09/24/2022 CLINICAL DATA:  Status post fall. EXAM: PORTABLE CHEST 1 VIEW COMPARISON:  Jun 05, 2022 FINDINGS: The cardiac silhouette is mildly enlarged and unchanged in size. There is marked severity calcification of the thoracic aorta. Mild, diffuse, chronic appearing  increased lung markings are seen without evidence of acute infiltrate, pleural effusion or pneumothorax. No acute osseous abnormalities are identified. IMPRESSION: Chronic appearing increased lung markings without  evidence of acute or active cardiopulmonary disease. Electronically Signed   By: Aram Candela M.D.   On: 09/24/2022 00:32   CT Head Wo Contrast  Result Date: 09/10/2022 CLINICAL DATA:  Head trauma, minor (Age >= 65y) EXAM: CT HEAD WITHOUT CONTRAST TECHNIQUE: Contiguous axial images were obtained from the base of the skull through the vertex without intravenous contrast. RADIATION DOSE REDUCTION: This exam was performed according to the departmental dose-optimization program which includes automated exposure control, adjustment of the mA and/or kV according to patient size and/or use of iterative reconstruction technique. COMPARISON:  CT head May 9, 24. FINDINGS: Brain: No evidence of acute infarction, hemorrhage, hydrocephalus, extra-axial collection or mass lesion/mass effect. Vascular: No hyperdense vessel identified Skull: No acute fracture. Sinuses/Orbits: Clear sinuses.  No acute orbital findings. Other: No mastoid effusions. IMPRESSION: No evidence of acute intracranial abnormality. Electronically Signed   By: Feliberto Harts M.D.   On: 09/10/2022 16:45   DG Ankle Complete Left  Result Date: 09/10/2022 CLINICAL DATA:  Unwitnessed fall.  Hip and leg pain. EXAM: LEFT ANKLE COMPLETE - 3+ VIEW COMPARISON:  None available FINDINGS: Diffuse osteopenia. Talar dome is intact. No fracture or dislocation. IMPRESSION: No acute abnormality of the left ankle. Electronically Signed   By: Acquanetta Belling M.D.   On: 09/10/2022 16:41   DG Hip Unilat W or Wo Pelvis 2-3 Views Right  Result Date: 09/10/2022 CLINICAL DATA:  Unwitnessed fall.  Neck, hip, and leg pain. EXAM: DG HIP (WITH OR WITHOUT PELVIS) 2-3V RIGHT COMPARISON:  06/05/2022 FINDINGS: No fracture or dislocation of the right hip. Moderate degenerative changes of the right hip. Limited cross-table lateral view. Advanced degenerative changes of the lumbar spine partially visualized. IMPRESSION: No fracture or dislocation of the right hip. If the patient  is unable to bear weight or has significant pain, consider CT or MRI of the right hip. Electronically Signed   By: Acquanetta Belling M.D.   On: 09/10/2022 16:40    Assessment/Plan Stage 3a chronic kidney disease (HCC) Na 139, K 4.2, Bun/creat 26/1.04 09/16/22, Na 136, K 4.4, Bun 25, creat 1.57 09/25/22 Encourage oral fluid intake, repeat CMP/eGFR 10/02/22 10/02/22 Na 138, K 4.5, Bun 23, creat 0.96  Anemia, chronic disease Hgb 9.1, no apparent s/s of bleeding. Denied abd pain, indigestion. Update Iron, Fesat, TIBC, ferritin, Vit B12, Folate, FOBT x3 10/02/22 Iron 41, wbc 7.0, Hgb 9.8, plt 465, neutrophils 58.5, Vit B12 477, Folate >24  Osteopenia on Alendronate, Ca, Vit D  Constipation stable, on Mirlax, Senokot S  Hypothyroidism  taking Levothyroxine, TSH 2.25 03/20/22  Senile dementia (HCC) poor safety awareness,  no behavioral issues, on ASA 81mg    Urinary frequency  OAB, not new, up to 3-4x per night, declined urology consultation     Family/ staff Communication: plan of care reviewed with the patient and charge nurse.   Labs/tests ordered:  Iron, Fesat, TIBC, ferritin, Vit B12, Folate, FOBT x3, CMP/eGFR  Time spend 40 minutes.

## 2022-09-30 NOTE — Assessment & Plan Note (Signed)
 on Alendronate, Ca, Vit D

## 2022-09-30 NOTE — Assessment & Plan Note (Addendum)
Na 139, K 4.2, Bun/creat 26/1.04 09/16/22, Na 136, K 4.4, Bun 25, creat 1.57 09/25/22 Encourage oral fluid intake, repeat CMP/eGFR 10/02/22 10/02/22 Na 138, K 4.5, Bun 23, creat 0.96

## 2022-09-30 NOTE — Assessment & Plan Note (Signed)
OAB, not new, up to 3-4x per night, declined urology consultation

## 2022-09-30 NOTE — Assessment & Plan Note (Addendum)
Hgb 9.1, no apparent s/s of bleeding. Denied abd pain, indigestion. Update Iron, Fesat, TIBC, ferritin, Vit B12, Folate, FOBT x3 10/02/22 Iron 41, wbc 7.0, Hgb 9.8, plt 465, neutrophils 58.5, Vit B12 477, Folate >24

## 2022-09-30 NOTE — Assessment & Plan Note (Signed)
 poor safety awareness,  no behavioral issues, on ASA 81mg 

## 2022-10-02 DIAGNOSIS — G4751 Confusional arousals: Secondary | ICD-10-CM | POA: Diagnosis not present

## 2022-10-02 DIAGNOSIS — N189 Chronic kidney disease, unspecified: Secondary | ICD-10-CM | POA: Diagnosis not present

## 2022-10-02 DIAGNOSIS — M545 Low back pain, unspecified: Secondary | ICD-10-CM | POA: Diagnosis not present

## 2022-10-02 LAB — HEPATIC FUNCTION PANEL
ALT: 14 U/L (ref 7–35)
AST: 20 (ref 13–35)
Alkaline Phosphatase: 100 (ref 25–125)
Bilirubin, Total: 0.5

## 2022-10-02 LAB — VITAMIN B12: Vitamin B-12: 477

## 2022-10-02 LAB — BASIC METABOLIC PANEL
BUN: 23 — AB (ref 4–21)
CO2: 25 — AB (ref 13–22)
Chloride: 105 (ref 99–108)
Creatinine: 1 (ref 0.5–1.1)
Glucose: 101
Potassium: 4.5 meq/L (ref 3.5–5.1)
Sodium: 138 (ref 137–147)

## 2022-10-02 LAB — COMPREHENSIVE METABOLIC PANEL
Albumin: 3.9 (ref 3.5–5.0)
Calcium: 8.9 (ref 8.7–10.7)
Globulin: 2.9
eGFR: 57

## 2022-10-02 LAB — CBC AND DIFFERENTIAL
HCT: 30 — AB (ref 36–46)
Hemoglobin: 9.8 — AB (ref 12.0–16.0)
Neutrophils Absolute: 4095
Platelets: 465 10*3/uL — AB (ref 150–400)
WBC: 7

## 2022-10-02 LAB — IRON,TIBC AND FERRITIN PANEL
%SAT: 13
Ferritin: 68
Iron: 41
TIBC: 304

## 2022-10-02 LAB — CBC: RBC: 3.29 — AB (ref 3.87–5.11)

## 2022-10-13 DIAGNOSIS — R2681 Unsteadiness on feet: Secondary | ICD-10-CM | POA: Diagnosis not present

## 2022-10-13 DIAGNOSIS — F03A0 Unspecified dementia, mild, without behavioral disturbance, psychotic disturbance, mood disturbance, and anxiety: Secondary | ICD-10-CM | POA: Diagnosis not present

## 2022-10-13 DIAGNOSIS — M6281 Muscle weakness (generalized): Secondary | ICD-10-CM | POA: Diagnosis not present

## 2022-10-13 DIAGNOSIS — M25561 Pain in right knee: Secondary | ICD-10-CM | POA: Diagnosis not present

## 2022-10-13 DIAGNOSIS — Z9181 History of falling: Secondary | ICD-10-CM | POA: Diagnosis not present

## 2022-10-20 ENCOUNTER — Non-Acute Institutional Stay (SKILLED_NURSING_FACILITY): Payer: Self-pay | Admitting: Sports Medicine

## 2022-10-20 DIAGNOSIS — E039 Hypothyroidism, unspecified: Secondary | ICD-10-CM | POA: Diagnosis not present

## 2022-10-20 DIAGNOSIS — K5900 Constipation, unspecified: Secondary | ICD-10-CM

## 2022-10-20 DIAGNOSIS — R600 Localized edema: Secondary | ICD-10-CM | POA: Diagnosis not present

## 2022-10-20 DIAGNOSIS — F0390 Unspecified dementia without behavioral disturbance: Secondary | ICD-10-CM

## 2022-10-20 DIAGNOSIS — M810 Age-related osteoporosis without current pathological fracture: Secondary | ICD-10-CM

## 2022-10-20 DIAGNOSIS — W19XXXA Unspecified fall, initial encounter: Secondary | ICD-10-CM

## 2022-10-20 NOTE — Progress Notes (Unsigned)
Provider:   Andree Coss Location:   [facility] [name]   Place of Service:   SNF   PCP: Venita Sheffield, MD Patient Care Team: Venita Sheffield, MD as PCP - General (Internal Medicine) [name] [name], [name], NP as Nurse Practitioner (Nurse Practitioner) Nelson Chimes, MD as Consulting Physician (Ophthalmology) Marcene Corning, MD as Consulting Physician (Orthopedic Surgery) [place], [name], MD as Consulting Physician (Dermatology) Hart Carwin, MD (Inactive) as Consulting Physician (Gastroenterology) Ovidio Kin, MD as Consulting Physician (General Surgery)  Extended Emergency Contact Information Primary Emergency Contact: Solis,[name] Mobile Phone: [phone] Relation: Son Secondary Emergency Contact: Jimmey Ralph (Bernarda Caffey, [place] Mobile Phone: [phone] Relation: Daughter  Code Status:  Goals of Care: Advanced Directive information    09/30/2022   12:03 PM  Advanced Directives  Does Patient Have a Medical Advance Directive? Yes  Type of Estate agent of [place];Living will;Out of facility DNR (pink MOST or yellow form)  Does patient want to make changes to medical advance directive? No - Patient declined  Copy of Healthcare Power of Attorney in Chart? Yes - validated most recent copy scanned in chart (See row information)  Pre-existing out of facility DNR order (yellow form or pink MOST form) Pink MOST form placed in chart (order not valid for inpatient use)      No chief complaint on file.   HPI: Patient is a 89 y.o. female seen today for follow up  Staff informed that pt had a fall on the weekend Pt does not remember how she fell, she had bruise on her left forearm near left elbow, steristrips were applied. Pt denies pain, she is able to move her extremities with no pain She ambulates with a rolater walker Sleeps in recliner States she does not participate much in  activities but enjoys eating lunch in dining hall. Pt denies fevers, chills, cough, SOB, abdominal pain, nausea, vomiting, dysuria, hematuria, bloody or dark stools. Sleeping fine Reports good appetite      Past Medical History:  Diagnosis Date   Cerebral atrophy (HCC) 01/21/2013   Hyperlipidemia    Hypothyroid    Osteoarthrosis, unspecified whether generalized or localized, unspecified site    Osteoporosis, unspecified    Other and unspecified hyperlipidemia    Other generalized ischemic cerebrovascular disease 01/21/2013   small vessel disease   Syncope and collapse    Umbilical hernia without mention of obstruction or gangrene    Unspecified hearing loss    Unspecified hypothyroidism    Unspecified vitamin D deficiency    Ventral hernia, unspecified, without mention of obstruction or gangrene    Past Surgical History:  Procedure Laterality Date   CATARACT EXTRACTION W/ INTRAOCULAR LENS  IMPLANT, BILATERAL  2012   Dr. Hazle Quant   HERNIA REPAIR  01/2008   laparoscopic Dr. Ovidio Kin   INGUINAL HERNIA REPAIR N/A 05/10/2019   Procedure: LAPAROSCOPIC FEMORAL HERNIA REPAIR WITH MESH;  Surgeon: Axel Filler, MD;  Location: [facility] OR;  Service: General;  Laterality: N/A;   KNEE SURGERY  2008   Dr. Berneice Heinrich  left   TOE AMPUTATION  1986   little toe right foot  Dr. Cliffton Asters; [facility]    reports that she has never smoked. She has never used smokeless tobacco. She reports that she does not drink alcohol and does not use drugs. Social History   Socioeconomic History   Marital status: Widowed    Spouse name: Not  on file   Number of children: Not on file   Years of education: Not on file   Highest education level: Not on file  Occupational History   Occupation: retired Nurse, mental health: RETIRED  Tobacco Use   Smoking status: Never   Smokeless tobacco: Never  Vaping Use   Vaping status: Never Used  Substance and Sexual Activity   Alcohol use: No   Drug use: No    Sexual activity: Never  Other Topics Concern   Not on file  Social History Narrative   L[facility] [name] [facility]   Widow   Living Will   Never smoked   No alcohol    Social Determinants of Health   Financial Resource Strain: Low Risk  (01/08/2017)   Overall Financial Resource Strain (CARDIA)    Difficulty of Paying Living Expenses: Not hard at all  Food Insecurity: No Food Insecurity (01/08/2017)   Hunger Vital Sign    Worried About Running Out of Food in the Last Year: Never true    Ran Out of Food in the Last Year: Never true  Transportation Needs: No Transportation Needs (01/08/2017)   PRAPARE - Administrator, Civil Service (Medical): No    Lack of Transportation (Non-Medical): No  Physical Activity: Insufficiently Active (01/08/2017)   Exercise Vital Sign    Days of Exercise per Week: 6 days    Minutes of Exercise per Session: 20 min  Stress: No Stress Concern Present (01/08/2017)   [employer] of Occupational Health - Occupational Stress Questionnaire    Feeling of Stress : Not at all  Social Connections: Somewhat Isolated (01/08/2017)   Social Connection and Isolation Panel [NHANES]    Frequency of Communication with Friends and Family: Twice a week    Frequency of Social Gatherings with Friends and Family: Three times a week    Attends Religious Services: More than 4 times per year    Active Member of Clubs or Organizations: No    Attends Banker Meetings: Never    Marital Status: Widowed  Intimate Partner Violence: Not At Risk (01/08/2017)   Humiliation, Afraid, Rape, and Kick questionnaire    Fear of Current or Ex-Partner: No    Emotionally Abused: No    Physically Abused: No    Sexually Abused: No    Functional Status Survey:    Family History  Problem Relation Age of Onset   Melanoma Sister    Cancer Sister     Health Maintenance  Topic Date Due   INFLUENZA VACCINE  08/28/2022   COVID-19 Vaccine (7 -  2023-24 season) 09/28/2022   Medicare Annual Wellness (AWV)  02/19/2023   DTaP/Tdap/Td (4 - Td or Tdap) 06/13/2027   Pneumonia Vaccine 40+ Years old  Completed   DEXA SCAN  Completed   Zoster Vaccines- Shingrix  Completed   HPV VACCINES  Aged Out    Allergies  Allergen Reactions   Dust Mite Extract    Mushroom Extract Complex Other (See Comments)    "Allergic," per paperwork from facility   Penicillins Diarrhea and Other (See Comments)    "Allergic," per paperwork from facility    Outpatient Encounter Medications as of 10/20/2022  Medication Sig   acetaminophen (TYLENOL) 500 MG tablet Take 2 tablets (1,000 mg total) by mouth every 6 (six) hours as needed.   alendronate (FOSAMAX) 70 MG tablet Take 70 mg by mouth once a week. related to AGE-RELATED OSTEOPOROSIS WITHOUT CURRENT  PATHOLOGICAL FRACTURE (M81.0)   aspirin 81 MG tablet Take 81 mg by mouth daily.   Calcium Carbonate-Vitamin D 600-200 MG-UNIT TABS Take 1 tablet by mouth 2 (two) times daily.   HYDROcodone-acetaminophen (NORCO/VICODIN) 5-325 MG tablet Take 0.5 tablets by mouth 2 (two) times daily. Two listings per [employer] at [name]   hydrocortisone (PROCTO-MED [facility] 2.5 % rectal cream Place 1 Application rectally as needed for hemorrhoids or anal itching.   levothyroxine (SYNTHROID) 100 MCG tablet Take 100 mcg by mouth daily.   lidocaine (ASPERCREME LIDOCAINE) 4 % Place 1 patch onto the skin daily. Apply to SOLE OF RIGHT FOOT   Multiple Vitamin (MULTIVITAMIN WITH MINERALS) TABS Take 1 tablet by mouth daily.   Omega-3 Fatty Acids (FISH OIL) 1200 MG CAPS Take 1 capsule by mouth daily.   sennosides-docusate sodium (SENOKOT-S) 8.6-50 MG tablet Take 1 tablet by mouth daily.   Sodium Phosphates (FLEET ENEMA RE) Insert 1 unit rectally as needed for constipation repeat until positive results   Zinc Oxide 10 % OINT Apply 1 Application topically as directed. Every shift to buttocks for redness   No  facility-administered encounter medications on file as of 10/20/2022.    Review of Systems  Constitutional:  Negative for chills and fever.  HENT:  Negative for sinus pressure and sore throat.   Respiratory:  Negative for cough, shortness of breath and wheezing.   Cardiovascular:  Positive for leg swelling. Negative for chest pain and palpitations.  Gastrointestinal:  Negative for abdominal distention, abdominal pain, blood in stool, constipation, diarrhea, nausea and vomiting.  Genitourinary:  Negative for dysuria, frequency and urgency.  Neurological:  Negative for dizziness, weakness and numbness.  Psychiatric/Behavioral:  Negative for confusion. The patient is not nervous/anxious.     There were no vitals filed for this visit. There is no height or weight on file to calculate BMI. Physical Exam Constitutional:      Appearance: Normal appearance.  HENT:     Head: Normocephalic.  Cardiovascular:     Rate and Rhythm: Normal rate and regular rhythm.     Heart sounds: Murmur heard.  Pulmonary:     Effort: Pulmonary effort is normal. No respiratory distress.     Breath sounds: Normal breath sounds. No wheezing.  Abdominal:     General: Bowel sounds are normal. There is no distension.     Tenderness: There is no abdominal tenderness. There is no guarding or rebound.  Musculoskeletal:        General: Swelling (chronic, wearing compression stockings) present. No tenderness.  Skin:    Comments: Left elbow- bruise , steri strips with dried blood noted. No point tenderness Left shoulder rom intact   Neurological:     Mental Status: She is alert. Mental status is at baseline.     Sensory: No sensory deficit.     Motor: No weakness.     Labs reviewed: Basic Metabolic Panel: Recent Labs    06/05/22 1715 09/10/22 1710 09/16/22 0000  NA 139 138 139  K 3.8 3.4* 4.2  CL 106 103 105  CO2 22 26 23*  GLUCOSE 118* 107*  --   BUN 17 26* 26*  CREATININE 0.99 1.09* 1.0  CALCIUM 9.3  9.4 8.9   Liver Function Tests: Recent Labs    02/10/22 0000 06/05/22 1715 09/10/22 1710  AST 21 22 21   ALT 13 15 16   ALKPHOS 70 69 65  BILITOT  --  0.5 0.3  PROT  --  7.4 7.5  ALBUMIN 3.9 3.8 3.9   No results for input(s): "LIPASE", "AMYLASE" in the last 8760 hours. No results for input(s): "AMMONIA" in the last 8760 hours. CBC: Recent Labs    03/20/22 0000 06/05/22 1715 09/10/22 1710  WBC 6.3 8.6 9.4  NEUTROABS 3,805.00 5.7 6.5  HGB 12.5 11.8* 11.4*  HCT 37 37.9 36.2  MCV  --  93.6 92.6  PLT 397 347 347   Cardiac Enzymes: No results for input(s): "CKTOTAL", "CKMB", "CKMBINDEX", "TROPONINI" in the last 8760 hours. BNP: Invalid input(s): "POCBNP" Lab Results  Component Value Date   HGBA1C 5.9 (H) 01/31/2020   Lab Results  Component Value Date   TSH 2.25 03/20/2022   Lab Results  Component Value Date   VITAMINB12 673 08/16/2019   No results found for: "FOLATE" No results found for: "IRON", "TIBC", "FERRITIN"  Imaging and Procedures obtained prior to SNF admission: CT Hip Right Wo Contrast  Result Date: 09/24/2022 CLINICAL DATA:  Trauma to the right hip.  Concern for fracture. EXAM: CT OF THE RIGHT HIP WITHOUT CONTRAST TECHNIQUE: Multidetector CT imaging of the right hip was performed according to the standard protocol. Multiplanar CT image reconstructions were also generated. RADIATION DOSE REDUCTION: This exam was performed according to the departmental dose-optimization program which includes automated exposure control, adjustment of the mA and/or kV according to patient size and/or use of iterative reconstruction technique. COMPARISON:  Radiograph dated 09/24/2022. FINDINGS: Bones/Joint/Cartilage There is no acute fracture or dislocation. The bones are osteopenic. Severe arthritic changes of the right hip with subcortical cystic changes of the femoral head and acetabulum. There is narrowing of the joint space with near bone on bone contact. No effusion. Ligaments  Suboptimally assessed by CT. Muscles and Tendons No intramuscular fluid collection or hematoma. Soft tissues Contusion of the subcutaneous soft tissues of the lateral hip. No large hematoma or fluid collection. IMPRESSION: 1. No acute fracture or dislocation. 2. Severe arthritic changes of the right hip. Electronically Signed   By: Elgie Collard M.D.   On: 09/24/2022 01:54   CT Head Wo Contrast  Result Date: 09/24/2022 CLINICAL DATA:  Trauma EXAM: CT HEAD WITHOUT CONTRAST CT CERVICAL SPINE WITHOUT CONTRAST TECHNIQUE: Multidetector CT imaging of the head and cervical spine was performed following the standard protocol without intravenous contrast. Multiplanar CT image reconstructions of the cervical spine were also generated. RADIATION DOSE REDUCTION: This exam was performed according to the departmental dose-optimization program which includes automated exposure control, adjustment of the mA and/or kV according to patient size and/or use of iterative reconstruction technique. COMPARISON:  Head CT 09/10/2022 FINDINGS: CT HEAD FINDINGS Brain: There is no mass, hemorrhage or extra-axial collection. There is generalized atrophy without lobar predilection. There is hypoattenuation of the periventricular white matter, most commonly indicating chronic ischemic microangiopathy. Vascular: No abnormal hyperdensity of the major intracranial arteries or dural venous sinuses. No intracranial atherosclerosis. Skull: The visualized skull base, calvarium and extracranial soft tissues are normal. Ground-glass focus in the frontal calvarium, possibly fibrous dysplasia. Sinuses/Orbits: No fluid levels or advanced mucosal thickening of the visualized paranasal sinuses. No mastoid or middle ear effusion. The orbits are normal. CT CERVICAL SPINE FINDINGS Alignment: No static subluxation. Facets are aligned. Occipital condyles are normally positioned. Skull base and vertebrae: No acute fracture. Soft tissues and spinal canal: No  prevertebral fluid or swelling. No visible canal hematoma. Disc levels: No advanced spinal canal or neural foraminal stenosis. Upper chest: No pneumothorax, pulmonary nodule or pleural effusion. Other: Normal visualized  paraspinal cervical soft tissues. IMPRESSION: 1. No acute intracranial abnormality. 2. Chronic ischemic microangiopathy and generalized atrophy. 3. No acute fracture or static subluxation of the cervical spine. Electronically Signed   By: Deatra Robinson M.D.   On: 09/24/2022 00:37   CT Cervical Spine Wo Contrast  Result Date: 09/24/2022 CLINICAL DATA:  Trauma EXAM: CT HEAD WITHOUT CONTRAST CT CERVICAL SPINE WITHOUT CONTRAST TECHNIQUE: Multidetector CT imaging of the head and cervical spine was performed following the standard protocol without intravenous contrast. Multiplanar CT image reconstructions of the cervical spine were also generated. RADIATION DOSE REDUCTION: This exam was performed according to the departmental dose-optimization program which includes automated exposure control, adjustment of the mA and/or kV according to patient size and/or use of iterative reconstruction technique. COMPARISON:  Head CT 09/10/2022 FINDINGS: CT HEAD FINDINGS Brain: There is no mass, hemorrhage or extra-axial collection. There is generalized atrophy without lobar predilection. There is hypoattenuation of the periventricular white matter, most commonly indicating chronic ischemic microangiopathy. Vascular: No abnormal hyperdensity of the major intracranial arteries or dural venous sinuses. No intracranial atherosclerosis. Skull: The visualized skull base, calvarium and extracranial soft tissues are normal. Ground-glass focus in the frontal calvarium, possibly fibrous dysplasia. Sinuses/Orbits: No fluid levels or advanced mucosal thickening of the visualized paranasal sinuses. No mastoid or middle ear effusion. The orbits are normal. CT CERVICAL SPINE FINDINGS Alignment: No static subluxation. Facets are  aligned. Occipital condyles are normally positioned. Skull base and vertebrae: No acute fracture. Soft tissues and spinal canal: No prevertebral fluid or swelling. No visible canal hematoma. Disc levels: No advanced spinal canal or neural foraminal stenosis. Upper chest: No pneumothorax, pulmonary nodule or pleural effusion. Other: Normal visualized paraspinal cervical soft tissues. IMPRESSION: 1. No acute intracranial abnormality. 2. Chronic ischemic microangiopathy and generalized atrophy. 3. No acute fracture or static subluxation of the cervical spine. Electronically Signed   By: Deatra Robinson M.D.   On: 09/24/2022 00:37   DG Hips Bilat W or Wo Pelvis 3-4 Views  Result Date: 09/24/2022 CLINICAL DATA:  Status post fall. EXAM: DG HIP (WITH OR WITHOUT PELVIS) 3-4V BILAT COMPARISON:  September 10, 2022 FINDINGS: There is no evidence of an acute hip fracture or dislocation. Moderate severity degenerative changes are seen involving the right hip, in the form of joint space narrowing and acetabular sclerosis. IMPRESSION: Moderate severity degenerative changes involving the right hip without evidence of an acute osseous abnormality. Electronically Signed   By: Aram Candela M.D.   On: 09/24/2022 00:35   DG Chest Portable 1 View  Result Date: 09/24/2022 CLINICAL DATA:  Status post fall. EXAM: PORTABLE CHEST 1 VIEW COMPARISON:  Jun 05, 2022 FINDINGS: The cardiac silhouette is mildly enlarged and unchanged in size. There is marked severity calcification of the thoracic aorta. Mild, diffuse, chronic appearing increased lung markings are seen without evidence of acute infiltrate, pleural effusion or pneumothorax. No acute osseous abnormalities are identified. IMPRESSION: Chronic appearing increased lung markings without evidence of acute or active cardiopulmonary disease. Electronically Signed   By: Aram Candela M.D.   On: 09/24/2022 00:32    Assessment/Plan   Fall  Pt had bruise on her left arm near  elbow Instructed nurse to clean the wound and change the dressing Rom intact    OA  Stable  Cont with tylenol prn   Osteoporosis Cont with calcium, vit d    Hypothyroidism  Cont with synthyroid   Lower extremity swelling Instructed patient to keep her feet elevated  Use compression  stockings  Anemia No signs of bleeding  Hb improved 12   Family/ staff Communication: care plan discussed with the nurse  Labs/tests ordered: none  .[email or web address]

## 2022-10-22 ENCOUNTER — Encounter: Payer: Self-pay | Admitting: Sports Medicine

## 2022-11-12 ENCOUNTER — Encounter: Payer: Self-pay | Admitting: Adult Health

## 2022-11-12 ENCOUNTER — Non-Acute Institutional Stay (SKILLED_NURSING_FACILITY): Payer: PPO | Admitting: Adult Health

## 2022-11-12 DIAGNOSIS — R600 Localized edema: Secondary | ICD-10-CM

## 2022-11-12 DIAGNOSIS — F0390 Unspecified dementia without behavioral disturbance: Secondary | ICD-10-CM | POA: Diagnosis not present

## 2022-11-12 DIAGNOSIS — E039 Hypothyroidism, unspecified: Secondary | ICD-10-CM | POA: Diagnosis not present

## 2022-11-12 DIAGNOSIS — M810 Age-related osteoporosis without current pathological fracture: Secondary | ICD-10-CM | POA: Diagnosis not present

## 2022-11-12 DIAGNOSIS — M150 Primary generalized (osteo)arthritis: Secondary | ICD-10-CM

## 2022-11-12 NOTE — Progress Notes (Signed)
Location:  [name] Room Number: N066-B Place of Service:  [phone]) Provider:  Kenard Gower, DNP, FNP-BC  Patient Care Team: Venita Sheffield, MD as PCP - General (Internal Medicine) [name], [name], NP as Nurse Practitioner (Nurse Practitioner) Nelson Chimes, MD as Consulting Physician (Ophthalmology) Marcene Corning, MD as Consulting Physician (Orthopedic Surgery) [place], [name], MD as Consulting Physician (Dermatology) Hart Carwin, MD (Inactive) as Consulting Physician (Gastroenterology) Ovidio Kin, MD as Consulting Physician (General Surgery)  Extended Emergency Contact Information Primary Emergency Contact: Siever,[name] Mobile Phone: [phone] Relation: Son Secondary Emergency Contact: Jimmey Ralph (POA),Debra          [place], [place] Mobile Phone: [phone] Relation: Daughter  Code Status:  DNR   Goals of care: Advanced Directive information    11/12/2022    2:23 PM  Advanced Directives  Does Patient Have a Medical Advance Directive? Yes  Type of Estate agent of [place];Living will;Out of facility DNR (pink MOST or yellow form)  Does patient want to make changes to medical advance directive? No - Patient declined  Copy of Healthcare Power of Attorney in Chart? Yes - validated most recent copy scanned in chart (See row information)  Pre-existing out of facility DNR order (yellow form or pink MOST form) Pink MOST form placed in chart (order not valid for inpatient use)     Chief Complaint  Patient presents with   Medical Management of Chronic Issues    Routine Visit   Immunizations    Covid    HPI:  Pt is a 89 y.o. female seen today for medical management of chronic diseases.  She is a long-term care resident of [name].   Hypothyroidism, unspecified type  -  takes Levothyroxine, tsh 2.25  Senile dementia (HCC)  -  BIMS 3/15  Age  related osteoporosis, unspecified pathological fracture presence -  kyphotic, AP Spine L2-L3, takes Fosamax  Lower extremity edema  -  has BLE 2+edema, wears compression socks  Primary osteoarthritis involving multiple joints  -  able to walk with walker, takes Norco    Past Medical History:  Diagnosis Date   Cerebral atrophy (HCC) 01/21/2013   Hyperlipidemia    Hypothyroid    Osteoarthrosis, unspecified whether generalized or localized, unspecified site    Osteoporosis, unspecified    Other and unspecified hyperlipidemia    Other generalized ischemic cerebrovascular disease 01/21/2013   small vessel disease   Syncope and collapse    Umbilical hernia without mention of obstruction or gangrene    Unspecified hearing loss    Unspecified hypothyroidism    Unspecified vitamin D deficiency    Ventral hernia, unspecified, without mention of obstruction or gangrene    Past Surgical History:  Procedure Laterality Date   CATARACT EXTRACTION W/ INTRAOCULAR LENS  IMPLANT, BILATERAL  2012   Dr. Hazle Quant   HERNIA REPAIR  01/2008   laparoscopic Dr. Ovidio Kin   INGUINAL HERNIA REPAIR N/A 05/10/2019   Procedure: LAPAROSCOPIC FEMORAL HERNIA REPAIR WITH MESH;  Surgeon: Axel Filler, MD;  Location: [facility] OR;  Service: General;  Laterality: N/A;   KNEE SURGERY  2008   Dr. Berneice Heinrich  left   TOE AMPUTATION  1986   little toe right foot  Dr. Cliffton Asters; [place]    Allergies  Allergen Reactions   Dust Mite Extract    Mushroom Extract Complex Other (See Comments)    "Allergic," per paperwork from facility   Penicillins  Diarrhea and Other (See Comments)    "Allergic," per paperwork from facility    Outpatient Encounter Medications as of 11/12/2022  Medication Sig   acetaminophen (TYLENOL) 500 MG tablet Take 2 tablets (1,000 mg total) by mouth every 6 (six) hours as needed.   alendronate (FOSAMAX) 70 MG tablet Take 70 mg by mouth once a week. related to AGE-RELATED OSTEOPOROSIS WITHOUT CURRENT  PATHOLOGICAL FRACTURE (M81.0)   aspirin 81 MG tablet Take 81 mg by mouth daily.   Calcium Carbonate-Vitamin D 600-200 MG-UNIT TABS Take 1 tablet by mouth 2 (two) times daily.   HYDROcodone-acetaminophen (NORCO/VICODIN) 5-325 MG tablet Take 0.5 tablets by mouth 2 (two) times daily. Two listings per [employer] at [facility]   hydrocortisone (PROCTO-MED [facility]) 2.5 % rectal cream Place 1 Application rectally as needed for hemorrhoids or anal itching.   levothyroxine (SYNTHROID) 100 MCG tablet Take 100 mcg by mouth daily.   lidocaine (ASPERCREME LIDOCAINE) 4 % Place 1 patch onto the skin daily. Apply to SOLE OF RIGHT FOOT   Multiple Vitamin (MULTIVITAMIN WITH MINERALS) TABS Take 1 tablet by mouth daily.   Omega-3 Fatty Acids (FISH OIL) 1200 MG CAPS Take 1 capsule by mouth daily.   sennosides-docusate sodium (SENOKOT-S) 8.6-50 MG tablet Take 1 tablet by mouth daily.   Sodium Phosphates (FLEET ENEMA RE) Insert 1 unit rectally as needed for constipation repeat until positive results   Zinc Oxide 10 % OINT Apply 1 Application topically as directed. Every shift to buttocks for redness   No facility-administered encounter medications on file as of 11/12/2022.    Review of Systems   Unable to obtain due to dementia.    Immunization History  Administered Date(s) Administered   Fluad Quad(high Dose 65+) 10/29/2022   Influenza Whole 10/28/2011, 11/10/2012, 10/29/2017   Influenza, High Dose Seasonal PF 11/05/2016, 11/11/2018, 11/12/2020   Influenza-Unspecified 11/14/2013, 10/26/2014, 11/08/2015, 11/09/2019, 11/18/2021   Moderna Sars-Covid-2 Vaccination 01/31/2019, 02/28/2019, 12/06/2019, 06/26/2020   PFIZER(Purple Top)SARS-COV-2 Vaccination 10/16/2020   PPD Test 02/27/2021   Pfizer Covid-19 Vaccine Bivalent Booster 21yrs & up 11/28/2021   Pneumococcal Conjugate-13 02/05/2017   Pneumococcal Polysaccharide-23 02/08/2007, 10/14/2022   Td 01/28/1991, 06/12/2017   Tdap 06/12/2017   Zoster  Recombinant(Shingrix) 07/09/2017, 09/15/2017   Zoster, Live 12/27/2012   Pertinent  Health Maintenance Due  Topic Date Due   INFLUENZA VACCINE  Completed   DEXA SCAN  Completed      09/02/2019   12:05 PM 04/20/2020   10:57 AM 02/15/2021   12:46 PM 02/16/2021    2:41 AM 02/18/2022   10:36 AM  Fall Risk  Falls in the past year? 1 0   1  Was there an injury with Fall? 1    1  Fall Risk Category Calculator 2    3  Fall Risk Category (Retired) Moderate      (RETIRED) Patient Fall Risk Level   Low fall risk High fall risk   Patient at Risk for Falls Due to     History of fall(s);Impaired balance/gait;Impaired mobility  Fall risk Follow up     Education provided     Vitals:   11/12/22 1411  BP: 118/60  Pulse: 77  Resp: 18  Temp: (!) 97 F (36.1 C)  SpO2: 98%  Weight: 122 lb (55.3 kg)  Height: 4\' 10"  (1.473 m)   Body mass index is 25.5 kg/m.  Physical Exam Constitutional:      Appearance: Normal appearance.  HENT:     Head:  Normocephalic and atraumatic.     Ears:     Comments: Wears bilateral hearing aids    Nose: Nose normal.     Mouth/Throat:     Mouth: Mucous membranes are moist.  Eyes:     Conjunctiva/sclera: Conjunctivae normal.  Cardiovascular:     Rate and Rhythm: Normal rate and regular rhythm.  Pulmonary:     Effort: Pulmonary effort is normal.     Breath sounds: Normal breath sounds.  Abdominal:     General: Bowel sounds are normal.     Palpations: Abdomen is soft.  Musculoskeletal:     Cervical back: Normal range of motion.     Comments: Kyphotic, walks with walker  Skin:    General: Skin is warm and dry.  Neurological:     Mental Status: She is alert. Mental status is at baseline.  Psychiatric:        Behavior: Behavior normal.     Comments: agitated      Labs reviewed: Recent Labs    06/05/22 1715 09/10/22 1710 09/16/22 0000 09/25/22 0000 10/02/22 0000  NA 139 138 139 136* 138  K 3.8 3.4* 4.2 4.4 4.5  CL 106 103 105 102 105  CO2 22 26  23* 25* 25*  GLUCOSE 118* 107*  --   --   --   BUN 17 26* 26* 25* 23*  CREATININE 0.99 1.09* 1.0 1.6* 1.0  CALCIUM 9.3 9.4 8.9 9.2 8.9   Recent Labs    06/05/22 1715 09/10/22 1710 09/25/22 0000 10/02/22 0000  AST 22 21 22 20   ALT 15 16 15 14   ALKPHOS 69 65 72 100  BILITOT 0.5 0.3  --   --   PROT 7.4 7.5  --   --   ALBUMIN 3.8 3.9 3.7 3.9   Recent Labs    06/05/22 1715 09/10/22 1710 09/25/22 0000 10/02/22 0000  WBC 8.6 9.4 6.3 7.0  NEUTROABS 5.7 6.5 4,448.00 4,095.00  HGB 11.8* 11.4* 9.1* 9.8*  HCT 37.9 36.2 28* 30*  MCV 93.6 92.6  --   --   PLT 347 347 402* 465*   Lab Results  Component Value Date   TSH 2.25 03/20/2022   Lab Results  Component Value Date   HGBA1C 5.9 (H) 01/31/2020   Lab Results  Component Value Date   CHOL 205 (H) 11/22/2020   HDL 65 11/22/2020   LDLCALC 110 (H) 11/22/2020   TRIG 180 (H) 11/22/2020   CHOLHDL 3.2 11/22/2020    Significant Diagnostic Results in last 30 days:  No results found.  Assessment/Plan  1. Hypothyroidism, unspecified type Lab Results  Component Value Date   TSH 2.25 03/20/2022   -  continue Levothyroxine  2. Senile dementia (HCC) -  has severe cognitive impairment -  continue supportive care -  fall precautions  3. Age related osteoporosis, unspecified pathological fracture presence -  continue Fosamax -  fall precautions  4. Lower extremity edema -  continue compression socks -  elevate BLE at night -  monitor for skin breakdown  5. Primary osteoarthritis involving multiple joints -   ambulates with walker -  continue Norco     Family/ staff Communication: Discussed plan of care with charge nurse.  Labs/tests ordered: None    Kenard Gower, DNP, MSN, FNP-BC [facility] [phone] (Monday-Friday 8:00 a.m. - 5:00 p.m.) [phone] (after hours)

## 2022-11-24 ENCOUNTER — Encounter: Payer: Self-pay | Admitting: Sports Medicine

## 2022-11-24 ENCOUNTER — Non-Acute Institutional Stay (SKILLED_NURSING_FACILITY): Payer: PPO | Admitting: Sports Medicine

## 2022-11-24 DIAGNOSIS — M25561 Pain in right knee: Secondary | ICD-10-CM

## 2022-11-24 DIAGNOSIS — G8929 Other chronic pain: Secondary | ICD-10-CM | POA: Diagnosis not present

## 2022-11-24 DIAGNOSIS — M1711 Unilateral primary osteoarthritis, right knee: Secondary | ICD-10-CM | POA: Diagnosis not present

## 2022-11-24 DIAGNOSIS — M11261 Other chondrocalcinosis, right knee: Secondary | ICD-10-CM | POA: Diagnosis not present

## 2022-11-24 NOTE — Progress Notes (Signed)
Provider: Andree Coss Location:   [facility] [name]   Place of Service:   Skilled care   PCP: Venita Sheffield, MD Patient Care Team: Venita Sheffield, MD as PCP - General (Internal Medicine) [name] [name], [name], NP as Nurse Practitioner (Nurse Practitioner) Nelson Chimes, MD as Consulting Physician (Ophthalmology) Marcene Corning, MD as Consulting Physician (Orthopedic Surgery) [place], [name], MD as Consulting Physician (Dermatology) Hart Carwin, MD (Inactive) as Consulting Physician (Gastroenterology) Ovidio Kin, MD as Consulting Physician (General Surgery)  Extended Emergency Contact Information Primary Emergency Contact: Cdebaca,[name] Mobile Phone: [phone] Relation: Son Secondary Emergency Contact: Jimmey Ralph (Bernarda Caffey, [place] Mobile Phone: [phone] Relation: Daughter  Code Status:  Goals of Care: Advanced Directive information    11/12/2022    2:23 PM  Advanced Directives  Does Patient Have a Medical Advance Directive? Yes  Type of Estate agent of [place];Living will;Out of facility DNR (pink MOST or yellow form)  Does patient want to make changes to medical advance directive? No - Patient declined  Copy of Healthcare Power of Attorney in Chart? Yes - validated most recent copy scanned in chart (See row information)  Pre-existing out of facility DNR order (yellow form or pink MOST form) Pink MOST form placed in chart (order not valid for inpatient use)      No chief complaint on file.   HPI: Patient is a 89 y.o. female seen today for  acute visit for Rt knee pain. Pt seen and examined in her room.  C/O pain in her Rt knee Chronic, intermittent pain, cannot describe her pain quality  Worse after walking from dining hall  Partially relieved after resting  No recent fall She is currently on norco , tylenol prn     Past Medical History:  Diagnosis  Date   Cerebral atrophy (HCC) 01/21/2013   Hyperlipidemia    Hypothyroid    Osteoarthrosis, unspecified whether generalized or localized, unspecified site    Osteoporosis, unspecified    Other and unspecified hyperlipidemia    Other generalized ischemic cerebrovascular disease 01/21/2013   small vessel disease   Syncope and collapse    Umbilical hernia without mention of obstruction or gangrene    Unspecified hearing loss    Unspecified hypothyroidism    Unspecified vitamin D deficiency    Ventral hernia, unspecified, without mention of obstruction or gangrene    Past Surgical History:  Procedure Laterality Date   CATARACT EXTRACTION W/ INTRAOCULAR LENS  IMPLANT, BILATERAL  2012   Dr. Hazle Quant   HERNIA REPAIR  01/2008   laparoscopic Dr. Ovidio Kin   INGUINAL HERNIA REPAIR N/A 05/10/2019   Procedure: LAPAROSCOPIC FEMORAL HERNIA REPAIR WITH MESH;  Surgeon: Axel Filler, MD;  Location: [facility] OR;  Service: General;  Laterality: N/A;   KNEE SURGERY  2008   Dr. Berneice Heinrich  left   TOE AMPUTATION  1986   little toe right foot  Dr. Cliffton Asters; [facility]    reports that she has never smoked. She has never used smokeless tobacco. She reports that she does not drink alcohol and does not use drugs. Social History   Socioeconomic History   Marital status: Widowed    Spouse name: Not on file   Number of children: Not on file   Years of education: Not on file   Highest education level: Not on file  Occupational History   Occupation: retired Nurse, mental health:  RETIRED  Tobacco Use   Smoking status: Never   Smokeless tobacco: Never  Vaping Use   Vaping status: Never Used  Substance and Sexual Activity   Alcohol use: No   Drug use: No   Sexual activity: Never  Other Topics Concern   Not on file  Social History Narrative   L[facility] at [facility] [name] [facility]   Widow   Living Will   Never smoked   No alcohol    Social Determinants of Health   Financial Resource Strain:  Low Risk  (01/08/2017)   Overall Financial Resource Strain (CARDIA)    Difficulty of Paying Living Expenses: Not hard at all  Food Insecurity: No Food Insecurity (01/08/2017)   Hunger Vital Sign    Worried About Running Out of Food in the Last Year: Never true    Ran Out of Food in the Last Year: Never true  Transportation Needs: No Transportation Needs (01/08/2017)   PRAPARE - Administrator, Civil Service (Medical): No    Lack of Transportation (Non-Medical): No  Physical Activity: Insufficiently Active (01/08/2017)   Exercise Vital Sign    Days of Exercise per Week: 6 days    Minutes of Exercise per Session: 20 min  Stress: No Stress Concern Present (01/08/2017)   [employer] of Occupational Health - Occupational Stress Questionnaire    Feeling of Stress : Not at all  Social Connections: Somewhat Isolated (01/08/2017)   Social Connection and Isolation Panel [NHANES]    Frequency of Communication with Friends and Family: Twice a week    Frequency of Social Gatherings with Friends and Family: Three times a week    Attends Religious Services: More than 4 times per year    Active Member of Clubs or Organizations: No    Attends Banker Meetings: Never    Marital Status: Widowed  Intimate Partner Violence: Not At Risk (01/08/2017)   Humiliation, Afraid, Rape, and Kick questionnaire    Fear of Current or Ex-Partner: No    Emotionally Abused: No    Physically Abused: No    Sexually Abused: No    Functional Status Survey:    Family History  Problem Relation Age of Onset   Melanoma Sister    Cancer Sister     Health Maintenance  Topic Date Due   COVID-19 Vaccine (7 - 2023-24 season) 09/28/2022   Medicare Annual Wellness (AWV)  02/19/2023   DTaP/Tdap/Td (4 - Td or Tdap) 06/13/2027   Pneumonia Vaccine 68+ Years old  Completed   INFLUENZA VACCINE  Completed   DEXA SCAN  Completed   Zoster Vaccines- Shingrix  Completed   HPV VACCINES  Aged Out     Allergies  Allergen Reactions   Dust Mite Extract    Mushroom Extract Complex Other (See Comments)    "Allergic," per paperwork from facility   Penicillins Diarrhea and Other (See Comments)    "Allergic," per paperwork from facility    Outpatient Encounter Medications as of 11/24/2022  Medication Sig   acetaminophen (TYLENOL) 500 MG tablet Take 2 tablets (1,000 mg total) by mouth every 6 (six) hours as needed.   alendronate (FOSAMAX) 70 MG tablet Take 70 mg by mouth once a week. related to AGE-RELATED OSTEOPOROSIS WITHOUT CURRENT PATHOLOGICAL FRACTURE (M81.0)   aspirin 81 MG tablet Take 81 mg by mouth daily.   Calcium Carbonate-Vitamin D 600-200 MG-UNIT TABS Take 1 tablet by mouth 2 (two) times daily.   HYDROcodone-acetaminophen (NORCO/VICODIN) 5-325 MG tablet  Take 0.5 tablets by mouth 2 (two) times daily. Two listings per [employer] at [facility]   hydrocortisone (PROCTO-[facility]) 2.5 % rectal cream Place 1 Application rectally as needed for hemorrhoids or anal itching.   levothyroxine (SYNTHROID) 100 MCG tablet Take 100 mcg by mouth daily.   lidocaine (ASPERCREME LIDOCAINE) 4 % Place 1 patch onto the skin daily. Apply to SOLE OF RIGHT FOOT   Multiple Vitamin (MULTIVITAMIN WITH MINERALS) TABS Take 1 tablet by mouth daily.   Omega-3 Fatty Acids (FISH OIL) 1200 MG CAPS Take 1 capsule by mouth daily.   sennosides-docusate sodium (SENOKOT-S) 8.6-50 MG tablet Take 1 tablet by mouth daily.   Sodium Phosphates (FLEET ENEMA RE) Insert 1 unit rectally as needed for constipation repeat until positive results   Zinc Oxide 10 % OINT Apply 1 Application topically as directed. Every shift to buttocks for redness   No facility-administered encounter medications on file as of 11/24/2022.    Review of Systems  Constitutional:  Negative for fever.  HENT:  Negative for sore throat.   Respiratory:  Negative for cough, shortness of breath and wheezing.   Cardiovascular:  Negative for  chest pain, palpitations and leg swelling.  Gastrointestinal:  Negative for abdominal distention, abdominal pain, blood in stool, constipation, diarrhea, nausea and vomiting.  Genitourinary:  Negative for dysuria, frequency and urgency.  Musculoskeletal:  Positive for arthralgias.  Neurological:  Negative for dizziness, weakness and numbness.  Psychiatric/Behavioral:  Negative for confusion.     There were no vitals filed for this visit. There is no height or weight on file to calculate BMI. Physical Exam Constitutional:      Appearance: Normal appearance.  HENT:     Head: Normocephalic and atraumatic.  Cardiovascular:     Rate and Rhythm: Normal rate and regular rhythm.     Heart sounds: Murmur heard.  Pulmonary:     Effort: Pulmonary effort is normal. No respiratory distress.     Breath sounds: Normal breath sounds. No wheezing.  Abdominal:     General: Bowel sounds are normal. There is no distension.     Tenderness: There is no abdominal tenderness. There is no guarding or rebound.  Musculoskeletal:     Comments: Rt knee - no redness, no warmth  Mod swelling + Joint line tenderness+ Strength intact   Skin:    General: Skin is dry.  Neurological:     Mental Status: She is alert. Mental status is at baseline.     Sensory: No sensory deficit.     Motor: No weakness.     Labs reviewed: Basic Metabolic Panel: Recent Labs    06/05/22 1715 09/10/22 1710 09/16/22 0000 09/25/22 0000 10/02/22 0000  NA 139 138 139 136* 138  K 3.8 3.4* 4.2 4.4 4.5  CL 106 103 105 102 105  CO2 22 26 23* 25* 25*  GLUCOSE 118* 107*  --   --   --   BUN 17 26* 26* 25* 23*  CREATININE 0.99 1.09* 1.0 1.6* 1.0  CALCIUM 9.3 9.4 8.9 9.2 8.9   Liver Function Tests: Recent Labs    06/05/22 1715 09/10/22 1710 09/25/22 0000 10/02/22 0000  AST 22 21 22 20   ALT 15 16 15 14   ALKPHOS 69 65 72 100  BILITOT 0.5 0.3  --   --   PROT 7.4 7.5  --   --   ALBUMIN 3.8 3.9 3.7 3.9   No results for  input(s): "LIPASE", "AMYLASE" in  the last 8760 hours. No results for input(s): "AMMONIA" in the last 8760 hours. CBC: Recent Labs    06/05/22 1715 09/10/22 1710 09/25/22 0000 10/02/22 0000  WBC 8.6 9.4 6.3 7.0  NEUTROABS 5.7 6.5 4,448.00 4,095.00  HGB 11.8* 11.4* 9.1* 9.8*  HCT 37.9 36.2 28* 30*  MCV 93.6 92.6  --   --   PLT 347 347 402* 465*   Cardiac Enzymes: No results for input(s): "CKTOTAL", "CKMB", "CKMBINDEX", "TROPONINI" in the last 8760 hours. BNP: Invalid input(s): "POCBNP" Lab Results  Component Value Date   HGBA1C 5.9 (H) 01/31/2020   Lab Results  Component Value Date   TSH 2.25 03/20/2022   Lab Results  Component Value Date   VITAMINB12 477 10/02/2022   No results found for: "FOLATE" Lab Results  Component Value Date   IRON 41 10/02/2022   TIBC 304 10/02/2022   FERRITIN 68 10/02/2022    Imaging and Procedures obtained prior to SNF admission: CT Hip Right Wo Contrast  Result Date: 09/24/2022 CLINICAL DATA:  Trauma to the right hip.  Concern for fracture. EXAM: CT OF THE RIGHT HIP WITHOUT CONTRAST TECHNIQUE: Multidetector CT imaging of the right hip was performed according to the standard protocol. Multiplanar CT image reconstructions were also generated. RADIATION DOSE REDUCTION: This exam was performed according to the departmental dose-optimization program which includes automated exposure control, adjustment of the mA and/or kV according to patient size and/or use of iterative reconstruction technique. COMPARISON:  Radiograph dated 09/24/2022. FINDINGS: Bones/Joint/Cartilage There is no acute fracture or dislocation. The bones are osteopenic. Severe arthritic changes of the right hip with subcortical cystic changes of the femoral head and acetabulum. There is narrowing of the joint space with near bone on bone contact. No effusion. Ligaments Suboptimally assessed by CT. Muscles and Tendons No intramuscular fluid collection or hematoma. Soft tissues Contusion  of the subcutaneous soft tissues of the lateral hip. No large hematoma or fluid collection. IMPRESSION: 1. No acute fracture or dislocation. 2. Severe arthritic changes of the right hip. Electronically Signed   By: Elgie Collard M.D.   On: 09/24/2022 01:54   CT Head Wo Contrast  Result Date: 09/24/2022 CLINICAL DATA:  Trauma EXAM: CT HEAD WITHOUT CONTRAST CT CERVICAL SPINE WITHOUT CONTRAST TECHNIQUE: Multidetector CT imaging of the head and cervical spine was performed following the standard protocol without intravenous contrast. Multiplanar CT image reconstructions of the cervical spine were also generated. RADIATION DOSE REDUCTION: This exam was performed according to the departmental dose-optimization program which includes automated exposure control, adjustment of the mA and/or kV according to patient size and/or use of iterative reconstruction technique. COMPARISON:  Head CT 09/10/2022 FINDINGS: CT HEAD FINDINGS Brain: There is no mass, hemorrhage or extra-axial collection. There is generalized atrophy without lobar predilection. There is hypoattenuation of the periventricular white matter, most commonly indicating chronic ischemic microangiopathy. Vascular: No abnormal hyperdensity of the major intracranial arteries or dural venous sinuses. No intracranial atherosclerosis. Skull: The visualized skull base, calvarium and extracranial soft tissues are normal. Ground-glass focus in the frontal calvarium, possibly fibrous dysplasia. Sinuses/Orbits: No fluid levels or advanced mucosal thickening of the visualized paranasal sinuses. No mastoid or middle ear effusion. The orbits are normal. CT CERVICAL SPINE FINDINGS Alignment: No static subluxation. Facets are aligned. Occipital condyles are normally positioned. Skull base and vertebrae: No acute fracture. Soft tissues and spinal canal: No prevertebral fluid or swelling. No visible canal hematoma. Disc levels: No advanced spinal canal or neural foraminal  stenosis. Upper chest:  No pneumothorax, pulmonary nodule or pleural effusion. Other: Normal visualized paraspinal cervical soft tissues. IMPRESSION: 1. No acute intracranial abnormality. 2. Chronic ischemic microangiopathy and generalized atrophy. 3. No acute fracture or static subluxation of the cervical spine. Electronically Signed   By: Deatra Robinson M.D.   On: 09/24/2022 00:37   CT Cervical Spine Wo Contrast  Result Date: 09/24/2022 CLINICAL DATA:  Trauma EXAM: CT HEAD WITHOUT CONTRAST CT CERVICAL SPINE WITHOUT CONTRAST TECHNIQUE: Multidetector CT imaging of the head and cervical spine was performed following the standard protocol without intravenous contrast. Multiplanar CT image reconstructions of the cervical spine were also generated. RADIATION DOSE REDUCTION: This exam was performed according to the departmental dose-optimization program which includes automated exposure control, adjustment of the mA and/or kV according to patient size and/or use of iterative reconstruction technique. COMPARISON:  Head CT 09/10/2022 FINDINGS: CT HEAD FINDINGS Brain: There is no mass, hemorrhage or extra-axial collection. There is generalized atrophy without lobar predilection. There is hypoattenuation of the periventricular white matter, most commonly indicating chronic ischemic microangiopathy. Vascular: No abnormal hyperdensity of the major intracranial arteries or dural venous sinuses. No intracranial atherosclerosis. Skull: The visualized skull base, calvarium and extracranial soft tissues are normal. Ground-glass focus in the frontal calvarium, possibly fibrous dysplasia. Sinuses/Orbits: No fluid levels or advanced mucosal thickening of the visualized paranasal sinuses. No mastoid or middle ear effusion. The orbits are normal. CT CERVICAL SPINE FINDINGS Alignment: No static subluxation. Facets are aligned. Occipital condyles are normally positioned. Skull base and vertebrae: No acute fracture. Soft tissues and  spinal canal: No prevertebral fluid or swelling. No visible canal hematoma. Disc levels: No advanced spinal canal or neural foraminal stenosis. Upper chest: No pneumothorax, pulmonary nodule or pleural effusion. Other: Normal visualized paraspinal cervical soft tissues. IMPRESSION: 1. No acute intracranial abnormality. 2. Chronic ischemic microangiopathy and generalized atrophy. 3. No acute fracture or static subluxation of the cervical spine. Electronically Signed   By: Deatra Robinson M.D.   On: 09/24/2022 00:37   DG Hips Bilat W or Wo Pelvis 3-4 Views  Result Date: 09/24/2022 CLINICAL DATA:  Status post fall. EXAM: DG HIP (WITH OR WITHOUT PELVIS) 3-4V BILAT COMPARISON:  September 10, 2022 FINDINGS: There is no evidence of an acute hip fracture or dislocation. Moderate severity degenerative changes are seen involving the right hip, in the form of joint space narrowing and acetabular sclerosis. IMPRESSION: Moderate severity degenerative changes involving the right hip without evidence of an acute osseous abnormality. Electronically Signed   By: Aram Candela M.D.   On: 09/24/2022 00:35   DG Chest Portable 1 View  Result Date: 09/24/2022 CLINICAL DATA:  Status post fall. EXAM: PORTABLE CHEST 1 VIEW COMPARISON:  Jun 05, 2022 FINDINGS: The cardiac silhouette is mildly enlarged and unchanged in size. There is marked severity calcification of the thoracic aorta. Mild, diffuse, chronic appearing increased lung markings are seen without evidence of acute infiltrate, pleural effusion or pneumothorax. No acute osseous abnormalities are identified. IMPRESSION: Chronic appearing increased lung markings without evidence of acute or active cardiopulmonary disease. Electronically Signed   By: Aram Candela M.D.   On: 09/24/2022 00:32    Assessment/Plan  1. Chronic pain of right knee Chronic rt knee pain  No recent falls Mod swelling with joint line tenderness+ Will get x ray knee  Will start volatren gel q6  prn  Pt declined for joint injection  Will cont with tylenol prn for pain  Cont with norco.   Family/ staff Communication: care  plan discussed with the nursing staff  Labs/tests ordered: none  I spent greater than 30  minutes for the care of this patient in face to face time, chart review, clinical documentation, patient education.

## 2022-12-17 ENCOUNTER — Non-Acute Institutional Stay (SKILLED_NURSING_FACILITY): Payer: PPO | Admitting: Nurse Practitioner

## 2022-12-17 ENCOUNTER — Encounter: Payer: Self-pay | Admitting: Nurse Practitioner

## 2022-12-17 DIAGNOSIS — R350 Frequency of micturition: Secondary | ICD-10-CM

## 2022-12-17 DIAGNOSIS — D638 Anemia in other chronic diseases classified elsewhere: Secondary | ICD-10-CM

## 2022-12-17 DIAGNOSIS — E039 Hypothyroidism, unspecified: Secondary | ICD-10-CM

## 2022-12-17 DIAGNOSIS — F0390 Unspecified dementia without behavioral disturbance: Secondary | ICD-10-CM | POA: Diagnosis not present

## 2022-12-17 DIAGNOSIS — I350 Nonrheumatic aortic (valve) stenosis: Secondary | ICD-10-CM | POA: Diagnosis not present

## 2022-12-17 DIAGNOSIS — N1831 Chronic kidney disease, stage 3a: Secondary | ICD-10-CM | POA: Diagnosis not present

## 2022-12-17 DIAGNOSIS — M150 Primary generalized (osteo)arthritis: Secondary | ICD-10-CM

## 2022-12-17 DIAGNOSIS — K5900 Constipation, unspecified: Secondary | ICD-10-CM | POA: Diagnosis not present

## 2022-12-17 DIAGNOSIS — E785 Hyperlipidemia, unspecified: Secondary | ICD-10-CM

## 2022-12-17 DIAGNOSIS — R609 Edema, unspecified: Secondary | ICD-10-CM

## 2022-12-17 DIAGNOSIS — M85852 Other specified disorders of bone density and structure, left thigh: Secondary | ICD-10-CM | POA: Diagnosis not present

## 2022-12-17 DIAGNOSIS — F0394 Unspecified dementia, unspecified severity, with anxiety: Secondary | ICD-10-CM

## 2022-12-17 NOTE — Assessment & Plan Note (Signed)
stable, on Mirlax, Senokot S

## 2022-12-17 NOTE — Assessment & Plan Note (Addendum)
taking Levothyroxine, TSH 2.25 03/20/22 12/18/22 LDL 79, Na 139, K 4.6, Bun 26, creat 1.21, TSH 1.63, wbc 480, Vit B12 434, Vit D40

## 2022-12-17 NOTE — Assessment & Plan Note (Addendum)
BLE 1+ edema noted, no SOB, cough, or phlegm production. Echo 2023 severe AS CBC/diff, CMP/eGFR, TSH, lipids, Vit B12, Vit D, BNP Monitor weight weekly.  12/18/22 BNP 284, will try Furosemide 10mg  +Kcl MWF

## 2022-12-17 NOTE — Assessment & Plan Note (Signed)
Iron 41, Hgb 9.8 10/02/22

## 2022-12-17 NOTE — Assessment & Plan Note (Signed)
LDL 110 11/22/20, taking Omega 3 

## 2022-12-17 NOTE — Assessment & Plan Note (Signed)
Urinary frequency, OAB, not new, up to 3-4x per night, declined urology consultation

## 2022-12-17 NOTE — Assessment & Plan Note (Signed)
Hx of syncope,  aortic valve stenosis 

## 2022-12-17 NOTE — Assessment & Plan Note (Addendum)
poor safety awareness, appears anxious, on ASA 81mg , HPOA daughter requires trial of Cymbalta 30mg  every day. Update BMP one week

## 2022-12-17 NOTE — Assessment & Plan Note (Addendum)
Bun/creat 23/1.0 10/02/22 12/18/22 LDL 79, Na 139, K 4.6, Bun 26, creat 1.21, TSH 1.63, wbc 480, Vit B12 434, Vit D40

## 2022-12-17 NOTE — Assessment & Plan Note (Addendum)
Recurrent falls, ambulates with walker, unsteady gait.   OA: lower back pain, R knee, R shoulder, on Norco, Tylenol, 02/10/22 T11, L4 compression fx uncertain age.

## 2022-12-17 NOTE — Assessment & Plan Note (Signed)
on Alendronate, Ca, Vit D, due DEXA if HPOA desires.

## 2022-12-17 NOTE — Progress Notes (Addendum)
Location:   [facility] Room Number: N066-B Place of Service:  ALF (13) Provider: Arna Snipe Kinslea Frances NP  Venita Sheffield, MD  Patient Care Team: Venita Sheffield, MD as PCP - General (Internal Medicine) [name] Alfonzia Woolum X, NP as Nurse Practitioner (Nurse Practitioner) Nelson Chimes, MD as Consulting Physician (Ophthalmology) Marcene Corning, MD as Consulting Physician (Orthopedic Surgery) [place], [name], MD as Consulting Physician (Dermatology) Hart Carwin, MD (Inactive) as Consulting Physician (Gastroenterology) Ovidio Kin, MD as Consulting Physician (General Surgery)  Extended Emergency Contact Information Primary Emergency Contact: Bourne,[name] Mobile Phone: [phone] Relation: Son Secondary Emergency Contact: Jimmey Ralph (POA),Debra          [place], [place] Mobile Phone: [phone] Relation: Daughter  Code Status:  DNR Goals of care: Advanced Directive information    12/17/2022    4:58 PM  Advanced Directives  Does Patient Have a Medical Advance Directive? Yes  Type of Estate agent of [place];Living will;Out of facility DNR (pink MOST or yellow form)  Does patient want to make changes to medical advance directive? No - Patient declined  Copy of Healthcare Power of Attorney in Chart? Yes - validated most recent copy scanned in chart (See row information)  Pre-existing out of facility DNR order (yellow form or pink MOST form) Pink MOST form placed in chart (order not valid for inpatient use)     Chief Complaint  Patient presents with   Medical Management of Chronic Issues    Routine Visit    HPI:  Pt is a 89 y.o. female seen today for medical management of chronic diseases.                  Recurrent falls, ambulates with walker, unsteady gait.   OA: lower back pain, R knee, R shoulder, on Norco, Tylenol, 02/10/22 T11, L4 compression fx uncertain age.  Edema BLE, no respiratory symptoms  presently. Echo 2023 severe AS. 12/18/22 BNP 284, will try Furosemide 10mg  +Kcl MWF OP, on Alendronate, Ca, Vit D, last DEXA 01/05/20 t score -2.1, DEXA if HPOA desires.  Constipation, stable, on Mirlax, Senokot S Hx of syncope,  aortic valve stenosis CKD Bun/creat 23/1.0 10/02/22 Hyperlipidemia, LDL 110 11/22/20, taking Omega 3 Hypothyroidism, taking Levothyroxine, TSH 2.25 03/20/22 Dementia, poor safety awareness,  no behavioral issues, on ASA 81mg               Anemia, Iron 41, Hgb 9.8 10/02/22             Urinary frequency, OAB, not new, up to 3-4x per night, declined urology consultation             Gait abnormality, ambulates with walker, risk of falling.     Past Medical History:  Diagnosis Date   Cerebral atrophy (HCC) 01/21/2013   Hyperlipidemia    Hypothyroid    Osteoarthrosis, unspecified whether generalized or localized, unspecified site    Osteoporosis, unspecified    Other and unspecified hyperlipidemia    Other generalized ischemic cerebrovascular disease 01/21/2013   small vessel disease   Syncope and collapse    Umbilical hernia without mention of obstruction or gangrene    Unspecified hearing loss    Unspecified hypothyroidism    Unspecified vitamin D deficiency    Ventral hernia, unspecified, without mention of obstruction or gangrene    Past Surgical History:  Procedure Laterality Date   CATARACT EXTRACTION W/ INTRAOCULAR LENS  IMPLANT, BILATERAL  2012   Dr. Hazle Quant  HERNIA REPAIR  01/2008   laparoscopic Dr. Ovidio Kin   INGUINAL HERNIA REPAIR N/A 05/10/2019   Procedure: LAPAROSCOPIC FEMORAL HERNIA REPAIR WITH MESH;  Surgeon: Axel Filler, MD;  Location: [facility] OR;  Service: General;  Laterality: N/A;   KNEE SURGERY  2008   Dr. Berneice Heinrich  left   TOE AMPUTATION  1986   little toe right foot  Dr. Cliffton Asters; [place]    Allergies  Allergen Reactions   Dust Mite Extract    Mushroom Extract Complex Other (See Comments)    "Allergic," per paperwork from  facility   Penicillins Diarrhea and Other (See Comments)    "Allergic," per paperwork from facility    Allergies as of 12/17/2022       Reactions   Dust Mite Extract    Mushroom Extract Complex Other (See Comments)   "Allergic," per paperwork from facility   Penicillins Diarrhea, Other (See Comments)   "Allergic," per paperwork from facility        Medication List        Accurate as of December 17, 2022 11:59 PM. If you have any questions, ask your nurse or doctor.          acetaminophen 500 MG tablet Commonly known as: TYLENOL Take 2 tablets (1,000 mg total) by mouth every 6 (six) hours as needed.   Aspercreme Lidocaine 4 % Generic drug: lidocaine Place 1 patch onto the skin daily. Apply to SOLE OF RIGHT FOOT   aspirin 81 MG tablet Take 81 mg by mouth daily.   Calcium Carbonate-Vitamin D 600-200 MG-UNIT Tabs Take 1 tablet by mouth 2 (two) times daily.   Fish Oil 1200 MG Caps Take 1 capsule by mouth daily.   FLEET ENEMA RE Insert 1 unit rectally as needed for constipation repeat until positive results   Fosamax 70 MG tablet Generic drug: alendronate Take 70 mg by mouth once a week. related to AGE-RELATED OSTEOPOROSIS WITHOUT CURRENT PATHOLOGICAL FRACTURE (M81.0)   HYDROcodone-acetaminophen 5-325 MG tablet Commonly known as: NORCO/VICODIN Take 0.5 tablets by mouth 2 (two) times daily. Two listings per [employer] at [facility]   hydrocortisone 2.5 % rectal cream Commonly known as: Procto-Med HC Place 1 Application rectally as needed for hemorrhoids or anal itching.   levothyroxine 100 MCG tablet Commonly known as: SYNTHROID Take 100 mcg by mouth daily.   multivitamin with minerals Tabs tablet Take 1 tablet by mouth daily.   Nyamyc powder Generic drug: nystatin Apply 1 Application topically.   sennosides-docusate sodium 8.6-50 MG tablet Commonly known as: SENOKOT-S Take 1 tablet by mouth daily.   Zinc Oxide 10 % Oint Apply 1  Application topically as directed. Every shift to buttocks for redness        Review of Systems  Constitutional:  Negative for appetite change, fatigue and fever.  HENT:  Positive for hearing loss. Negative for congestion and voice change.   Respiratory:  Negative for cough.   Cardiovascular:  Negative for leg swelling.  Gastrointestinal:  Negative for abdominal pain.  Genitourinary:  Positive for frequency. Negative for dysuria and urgency.  Musculoskeletal:  Positive for arthralgias, back pain and gait problem.       Lower back pain, R knee  Skin:  Negative for color change.  Neurological:  Negative for speech difficulty, weakness and light-headedness.       Memory lapses.   Psychiatric/Behavioral:  Negative for behavioral problems and sleep disturbance. The patient is nervous/anxious.     Immunization History  Administered Date(s) Administered   Fluad Quad(high Dose 65+) 10/29/2022   Influenza Whole 10/28/2011, 11/10/2012, 10/29/2017   Influenza, High Dose Seasonal PF 11/05/2016, 11/11/2018, 11/12/2020   Influenza-Unspecified 11/14/2013, 10/26/2014, 11/08/2015, 11/09/2019, 11/18/2021   Moderna Covid-19 Fall Seasonal Vaccine 41yrs & older 11/12/2022   Moderna Sars-Covid-2 Vaccination 01/31/2019, 02/28/2019, 12/06/2019, 06/26/2020   PFIZER(Purple Top)SARS-COV-2 Vaccination 10/16/2020   PPD Test 02/27/2021   Pfizer Covid-19 Vaccine Bivalent Booster 32yrs & up 11/28/2021   Pneumococcal Conjugate-13 02/05/2017   Pneumococcal Polysaccharide-23 02/08/2007, 10/14/2022   Td 01/28/1991, 06/12/2017   Tdap 06/12/2017   Zoster Recombinant(Shingrix) 07/09/2017, 09/15/2017   Zoster, Live 12/27/2012   Pertinent  Health Maintenance Due  Topic Date Due   INFLUENZA VACCINE  Completed   DEXA SCAN  Completed      09/02/2019   12:05 PM 04/20/2020   10:57 AM 02/15/2021   12:46 PM 02/16/2021    2:41 AM 02/18/2022   10:36 AM  Fall Risk  Falls in the past year? 1 0   1  Was there an injury  with Fall? 1    1  Fall Risk Category Calculator 2    3  Fall Risk Category (Retired) Moderate      (RETIRED) Patient Fall Risk Level   Low fall risk High fall risk   Patient at Risk for Falls Due to     History of fall(s);Impaired balance/gait;Impaired mobility  Fall risk Follow up     Education provided   Functional Status Survey:    Vitals:   12/17/22 1654 12/18/22 1115  BP: (!) 110/56 122/84  Pulse: 77   Resp: 18   Temp: (!) 97 F (36.1 C)   SpO2: 98%   Weight: 121 lb 12.8 oz (55.2 kg)   Height: 4\' 10"  (1.473 m)    Body mass index is 25.46 kg/m. Physical Exam Constitutional:      Appearance: Normal appearance.  HENT:     Head: Normocephalic and atraumatic.     Mouth/Throat:     Mouth: Mucous membranes are moist.  Eyes:     Extraocular Movements: Extraocular movements intact.     Conjunctiva/sclera: Conjunctivae normal.     Pupils: Pupils are equal, round, and reactive to light.  Cardiovascular:     Rate and Rhythm: Normal rate and regular rhythm.     Heart sounds: Murmur heard.  Pulmonary:     Effort: Pulmonary effort is normal.     Breath sounds: Normal breath sounds. No rales.  Abdominal:     General: Bowel sounds are normal. There is no distension.     Palpations: Abdomen is soft.     Tenderness: There is no abdominal tenderness. There is no right CVA tenderness, left CVA tenderness, guarding or rebound.  Musculoskeletal:     Cervical back: Normal range of motion and neck supple.     Right lower leg: Edema present.     Left lower leg: Edema present.     Comments: Lower back pain R shoulder pain, chronic R knee pain  Skin:    General: Skin is warm and dry.     Findings: Bruising present.     Comments: Right forehead hematoma, large bruise dorsum left hand  Neurological:     General: No focal deficit present.     Mental Status: She is alert and oriented to person, place, and time. Mental status is at baseline.     Gait: Gait abnormal.  Psychiatric:      Comments: Appears anxious.  Labs reviewed: Recent Labs    06/05/22 1715 09/10/22 1710 09/16/22 0000 09/25/22 0000 10/02/22 0000  NA 139 138 139 136* 138  K 3.8 3.4* 4.2 4.4 4.5  CL 106 103 105 102 105  CO2 22 26 23* 25* 25*  GLUCOSE 118* 107*  --   --   --   BUN 17 26* 26* 25* 23*  CREATININE 0.99 1.09* 1.0 1.6* 1.0  CALCIUM 9.3 9.4 8.9 9.2 8.9   Recent Labs    06/05/22 1715 09/10/22 1710 09/25/22 0000 10/02/22 0000  AST 22 21 22 20   ALT 15 16 15 14   ALKPHOS 69 65 72 100  BILITOT 0.5 0.3  --   --   PROT 7.4 7.5  --   --   ALBUMIN 3.8 3.9 3.7 3.9   Recent Labs    06/05/22 1715 09/10/22 1710 09/25/22 0000 10/02/22 0000  WBC 8.6 9.4 6.3 7.0  NEUTROABS 5.7 6.5 4,448.00 4,095.00  HGB 11.8* 11.4* 9.1* 9.8*  HCT 37.9 36.2 28* 30*  MCV 93.6 92.6  --   --   PLT 347 347 402* 465*   Lab Results  Component Value Date   TSH 2.25 03/20/2022   Lab Results  Component Value Date   HGBA1C 5.9 (H) 01/31/2020   Lab Results  Component Value Date   CHOL 205 (H) 11/22/2020   HDL 65 11/22/2020   LDLCALC 110 (H) 11/22/2020   TRIG 180 (H) 11/22/2020   CHOLHDL 3.2 11/22/2020    Significant Diagnostic Results in last 30 days:  No results found.  Assessment/Plan  Edema BLE 1+ edema noted, no SOB, cough, or phlegm production. Echo 2023 severe AS CBC/diff, CMP/eGFR, TSH, lipids, Vit B12, Vit D, BNP Monitor weight weekly.  12/18/22 BNP 284, will try Furosemide 10mg  +Kcl MWF  Osteoarthritis, multiple sites Recurrent falls, ambulates with walker, unsteady gait.   OA: lower back pain, R knee, R shoulder, on Norco, Tylenol, 02/10/22 T11, L4 compression fx uncertain age.   Osteopenia  on Alendronate, Ca, Vit D, due DEXA if HPOA desires.   Constipation stable, on Mirlax, Senokot S  Aortic valve stenosis Hx of syncope,  aortic valve stenosis  CKD (chronic kidney disease) stage 3, GFR 30-59 ml/min (HCC) Bun/creat 23/1.0 10/02/22 12/18/22 LDL 79, Na 139, K  4.6, Bun 26, creat 1.21, TSH 1.63, wbc 480, Vit B12 434, Vit D40   Dyslipidemia LDL 110 11/22/20, taking Omega 3  Hypothyroidism  taking Levothyroxine, TSH 2.25 03/20/22 12/18/22 LDL 79, Na 139, K 4.6, Bun 26, creat 1.21, TSH 1.63, wbc 480, Vit B12 434, Vit D40   Anxiety due to dementia (HCC) poor safety awareness, appears anxious, on ASA 81mg , HPOA daughter requires trial of Cymbalta 30mg  every day. Update BMP one week  Anemia, chronic disease Iron 41, Hgb 9.8 10/02/22  Urinary frequency Urinary frequency, OAB, not new, up to 3-4x per night, declined urology consultation   Family/ staff Communication: plan of care reviewed with the patient and charge nurse.   Labs/tests ordered:  CBC/diff, CMP/eGFR, TSH, lipids, Vit B12, Vit D, BNP  Time spend 30 minutes.

## 2022-12-18 ENCOUNTER — Encounter: Payer: Self-pay | Admitting: Nurse Practitioner

## 2022-12-18 DIAGNOSIS — E039 Hypothyroidism, unspecified: Secondary | ICD-10-CM | POA: Diagnosis not present

## 2022-12-18 DIAGNOSIS — E785 Hyperlipidemia, unspecified: Secondary | ICD-10-CM | POA: Diagnosis not present

## 2022-12-18 DIAGNOSIS — N189 Chronic kidney disease, unspecified: Secondary | ICD-10-CM | POA: Diagnosis not present

## 2022-12-18 DIAGNOSIS — M810 Age-related osteoporosis without current pathological fracture: Secondary | ICD-10-CM | POA: Diagnosis not present

## 2022-12-18 DIAGNOSIS — R609 Edema, unspecified: Secondary | ICD-10-CM | POA: Diagnosis not present

## 2022-12-23 DIAGNOSIS — R600 Localized edema: Secondary | ICD-10-CM | POA: Diagnosis not present

## 2022-12-23 LAB — CBC AND DIFFERENTIAL
HCT: 30 — AB (ref 36–46)
Hemoglobin: 9.5 — AB (ref 12.0–16.0)
Platelets: 281 10*3/uL (ref 150–400)
WBC: 5.8

## 2022-12-23 LAB — CBC: RBC: 3.21 — AB (ref 3.87–5.11)

## 2022-12-30 DIAGNOSIS — R600 Localized edema: Secondary | ICD-10-CM | POA: Diagnosis not present

## 2022-12-30 LAB — BASIC METABOLIC PANEL
BUN: 26 — AB (ref 4–21)
CO2: 30 — AB (ref 13–22)
Chloride: 100 (ref 99–108)
Creatinine: 1.1 (ref 0.5–1.1)
Glucose: 91
Potassium: 4.6 meq/L (ref 3.5–5.1)
Sodium: 137 (ref 137–147)

## 2022-12-30 LAB — COMPREHENSIVE METABOLIC PANEL: Calcium: 9.8 (ref 8.7–10.7)

## 2023-01-05 ENCOUNTER — Non-Acute Institutional Stay (SKILLED_NURSING_FACILITY): Payer: PPO | Admitting: Sports Medicine

## 2023-01-05 DIAGNOSIS — F419 Anxiety disorder, unspecified: Secondary | ICD-10-CM | POA: Diagnosis not present

## 2023-01-05 DIAGNOSIS — R600 Localized edema: Secondary | ICD-10-CM

## 2023-01-05 DIAGNOSIS — K219 Gastro-esophageal reflux disease without esophagitis: Secondary | ICD-10-CM | POA: Diagnosis not present

## 2023-01-05 DIAGNOSIS — M150 Primary generalized (osteo)arthritis: Secondary | ICD-10-CM

## 2023-01-05 DIAGNOSIS — M810 Age-related osteoporosis without current pathological fracture: Secondary | ICD-10-CM | POA: Diagnosis not present

## 2023-01-05 DIAGNOSIS — F0390 Unspecified dementia without behavioral disturbance: Secondary | ICD-10-CM | POA: Diagnosis not present

## 2023-01-05 NOTE — Progress Notes (Unsigned)
Provider: Venita Sheffield MD Location:   [facility] [name]   Place of Service:   Skilled care   PCP: Venita Sheffield, MD Patient Care Team: Venita Sheffield, MD as PCP - General (Internal Medicine) [name] [name], [name], NP as Nurse Practitioner (Nurse Practitioner) Nelson Chimes, MD as Consulting Physician (Ophthalmology) Marcene Corning, MD as Consulting Physician (Orthopedic Surgery) [place], [name], MD as Consulting Physician (Dermatology) Hart Carwin, MD (Inactive) as Consulting Physician (Gastroenterology) Ovidio Kin, MD as Consulting Physician (General Surgery)  Extended Emergency Contact Information Primary Emergency Contact: Flynt,[name] Mobile Phone: [phone] Relation: Son Secondary Emergency Contact: Jimmey Ralph (Bernarda Caffey,  [place] Mobile Phone: [phone] Relation: Daughter  Code Status:  Goals of Care: Advanced Directive information    12/17/2022    4:58 PM  Advanced Directives  Does Patient Have a Medical Advance Directive? Yes  Type of Estate agent of [place];Living will;Out of facility DNR (pink MOST or yellow form)  Does patient want to make changes to medical advance directive? No - Patient declined  Copy of Healthcare Power of Attorney in Chart? Yes - validated most recent copy scanned in chart (See row information)  Pre-existing out of facility DNR order (yellow form or pink MOST form) Pink MOST form placed in chart (order not valid for inpatient use)      No chief complaint on file.   HPI: Patient is a 89 y.o. female seen today for  routine visit for chronic disease management  Pt seen and examined in the living room She knows her name, oriented to time and place Can tell me what she had for breakfast  States its time for her to go eat lunch   Pt c/o intermittent pain in her Rt knee  Able to ambulate with a walker  As per nursing staff pt has  baseline confusion, at times she refuses care and becomes combative  She was started on Cymbalta Consuming 75-100% of most meals  Past Medical History:  Diagnosis Date   Cerebral atrophy (HCC) 01/21/2013   Hyperlipidemia    Hypothyroid    Osteoarthrosis, unspecified whether generalized or localized, unspecified site    Osteoporosis, unspecified    Other and unspecified hyperlipidemia    Other generalized ischemic cerebrovascular disease 01/21/2013   small vessel disease   Syncope and collapse    Umbilical hernia without mention of obstruction or gangrene    Unspecified hearing loss    Unspecified hypothyroidism    Unspecified vitamin D deficiency    Ventral hernia, unspecified, without mention of obstruction or gangrene    Past Surgical History:  Procedure Laterality Date   CATARACT EXTRACTION W/ INTRAOCULAR LENS  IMPLANT, BILATERAL  2012   Dr. Hazle Quant   HERNIA REPAIR  01/2008   laparoscopic Dr. Ovidio Kin   INGUINAL HERNIA REPAIR N/A 05/10/2019   Procedure: LAPAROSCOPIC FEMORAL HERNIA REPAIR WITH MESH;  Surgeon: Axel Filler, MD;  Location: [facility] OR;  Service: General;  Laterality: N/A;   KNEE SURGERY  2008   Dr. Berneice Heinrich  left   TOE AMPUTATION  1986   little toe right foot  Dr. Cliffton Asters; [facility]    reports that she has never smoked. She has never used smokeless tobacco. She reports that she does not drink alcohol and does not use drugs. Social History   Socioeconomic History   Marital status: Widowed    Spouse name: Not on file   Number of  children: Not on file   Years of education: Not on file   Highest education level: Not on file  Occupational History   Occupation: retired Nurse, mental health: RETIRED  Tobacco Use   Smoking status: Never   Smokeless tobacco: Never  Vaping Use   Vaping status: Never Used  Substance and Sexual Activity   Alcohol use: No   Drug use: No   Sexual activity: Never  Other Topics Concern   Not on file  Social History  Narrative   Lives at [facility] [name] 03[facility]2005   Widow   Living Will   Never smoked   No alcohol    Social Determinants of Health   Financial Resource Strain: Low Risk  (01/08/2017)   Overall Financial Resource Strain (CARDIA)    Difficulty of Paying Living Expenses: Not hard at all  Food Insecurity: No Food Insecurity (01/08/2017)   Hunger Vital Sign    Worried About Running Out of Food in the Last Year: Never true    Ran Out of Food in the Last Year: Never true  Transportation Needs: No Transportation Needs (01/08/2017)   PRAPARE - Administrator, Civil Service (Medical): No    Lack of Transportation (Non-Medical): No  Physical Activity: Insufficiently Active (01/08/2017)   Exercise Vital Sign    Days of Exercise per Week: 6 days    Minutes of Exercise per Session: 20 min  Stress: No Stress Concern Present (01/08/2017)   [employer] of Occupational Health - Occupational Stress Questionnaire    Feeling of Stress : Not at all  Social Connections: Somewhat Isolated (01/08/2017)   Social Connection and Isolation Panel [NHANES]    Frequency of Communication with Friends and Family: Twice a week    Frequency of Social Gatherings with Friends and Family: Three times a week    Attends Religious Services: More than 4 times per year    Active Member of Clubs or Organizations: No    Attends Banker Meetings: Never    Marital Status: Widowed  Intimate Partner Violence: Not At Risk (01/08/2017)   Humiliation, Afraid, Rape, and Kick questionnaire    Fear of Current or Ex-Partner: No    Emotionally Abused: No    Physically Abused: No    Sexually Abused: No    Functional Status Survey:    Family History  Problem Relation Age of Onset   Melanoma Sister    Cancer Sister     Health Maintenance  Topic Date Due   Medicare Annual Wellness (AWV)  02/19/2023   COVID-19 Vaccine (8 - 2023-24 season) 03/15/2023   DTaP/Tdap/Td (4 - Td or Tdap) 06/13/2027    Pneumonia Vaccine 10+ Years old  Completed   INFLUENZA VACCINE  Completed   DEXA SCAN  Completed   Zoster Vaccines- Shingrix  Completed   HPV VACCINES  Aged Out    Allergies  Allergen Reactions   Dust Mite Extract    Mushroom Extract Complex (Do Not Select) Other (See Comments)    "Allergic," per paperwork from facility   Penicillins Diarrhea and Other (See Comments)    "Allergic," per paperwork from facility    Outpatient Encounter Medications as of 01/05/2023  Medication Sig   acetaminophen (TYLENOL) 500 MG tablet Take 2 tablets (1,000 mg total) by mouth every 6 (six) hours as needed.   alendronate (FOSAMAX) 70 MG tablet Take 70 mg by mouth once a week. related to AGE-RELATED OSTEOPOROSIS WITHOUT CURRENT PATHOLOGICAL FRACTURE (M81.0)  aspirin 81 MG tablet Take 81 mg by mouth daily.   Calcium Carbonate-Vitamin D 600-200 MG-UNIT TABS Take 1 tablet by mouth 2 (two) times daily.   HYDROcodone-acetaminophen (NORCO/VICODIN) 5-325 MG tablet Take 0.5 tablets by mouth 2 (two) times daily. Two listings per [employer] at [facility]   hydrocortisone (PROCTO-MED [facility]) 2.5 % rectal cream Place 1 Application rectally as needed for hemorrhoids or anal itching.   levothyroxine (SYNTHROID) 100 MCG tablet Take 100 mcg by mouth daily.   lidocaine (ASPERCREME LIDOCAINE) 4 % Place 1 patch onto the skin daily. Apply to SOLE OF RIGHT FOOT   Multiple Vitamin (MULTIVITAMIN WITH MINERALS) TABS Take 1 tablet by mouth daily.   NYAMYC powder Apply 1 Application topically.   Omega-3 Fatty Acids (FISH OIL) 1200 MG CAPS Take 1 capsule by mouth daily.   sennosides-docusate sodium (SENOKOT-S) 8.6-50 MG tablet Take 1 tablet by mouth daily.   Sodium Phosphates (FLEET ENEMA RE) Insert 1 unit rectally as needed for constipation repeat until positive results   Zinc Oxide 10 % OINT Apply 1 Application topically as directed. Every shift to buttocks for redness   No facility-administered encounter  medications on file as of 01/05/2023.    Review of Systems  Unable to perform ROS: Dementia  Constitutional:  Negative for fever.  Respiratory:  Negative for cough and shortness of breath.   Cardiovascular:  Positive for leg swelling. Negative for chest pain.  Gastrointestinal:  Negative for abdominal pain, diarrhea and nausea.  Genitourinary:  Negative for dysuria and hematuria.  Musculoskeletal:  Positive for arthralgias.  Psychiatric/Behavioral:  Positive for agitation, behavioral problems and confusion.     There were no vitals filed for this visit. There is no height or weight on file to calculate BMI. Physical Exam Constitutional:      Appearance: Normal appearance.  HENT:     Head: Normocephalic.  Cardiovascular:     Rate and Rhythm: Normal rate and regular rhythm.     Heart sounds: Murmur heard.  Pulmonary:     Effort: Pulmonary effort is normal. No respiratory distress.     Breath sounds: Normal breath sounds. No wheezing.  Abdominal:     General: Bowel sounds are normal. There is no distension.     Tenderness: There is no abdominal tenderness. There is no guarding or rebound.     Comments:    Musculoskeletal:        General: Swelling present. No tenderness.     Comments: Chronic lower extremity swelling 2+ pitting odema  Skin:    General: Skin is dry.  Neurological:     Mental Status: She is alert. Mental status is at baseline.     Labs reviewed: Basic Metabolic Panel: Recent Labs    06/05/22 1715 09/10/22 1710 09/16/22 0000 09/25/22 0000 10/02/22 0000  NA 139 138 139 136* 138  K 3.8 3.4* 4.2 4.4 4.5  CL 106 103 105 102 105  CO2 22 26 23* 25* 25*  GLUCOSE 118* 107*  --   --   --   BUN 17 26* 26* 25* 23*  CREATININE 0.99 1.09* 1.0 1.6* 1.0  CALCIUM 9.3 9.4 8.9 9.2 8.9   Liver Function Tests: Recent Labs    06/05/22 1715 09/10/22 1710 09/25/22 0000 10/02/22 0000  AST 22 21 22 20   ALT 15 16 15 14   ALKPHOS 69 65 72 100  BILITOT 0.5 0.3  --    --   PROT 7.4 7.5  --   --  ALBUMIN 3.8 3.9 3.7 3.9   No results for input(s): "LIPASE", "AMYLASE" in the last 8760 hours. No results for input(s): "AMMONIA" in the last 8760 hours. CBC: Recent Labs    06/05/22 1715 09/10/22 1710 09/25/22 0000 10/02/22 0000  WBC 8.6 9.4 6.3 7.0  NEUTROABS 5.7 6.5 4,448.00 4,095.00  HGB 11.8* 11.4* 9.1* 9.8*  HCT 37.9 36.2 28* 30*  MCV 93.6 92.6  --   --   PLT 347 347 402* 465*   Cardiac Enzymes: No results for input(s): "CKTOTAL", "CKMB", "CKMBINDEX", "TROPONINI" in the last 8760 hours. BNP: Invalid input(s): "POCBNP" Lab Results  Component Value Date   HGBA1C 5.9 (H) 01/31/2020   Lab Results  Component Value Date   TSH 2.25 03/20/2022   Lab Results  Component Value Date   VITAMINB12 477 10/02/2022   No results found for: "FOLATE" Lab Results  Component Value Date   IRON 41 10/02/2022   TIBC 304 10/02/2022   FERRITIN 68 10/02/2022    Imaging and Procedures obtained prior to SNF admission: CT Hip Right Wo Contrast  Result Date: 09/24/2022 CLINICAL DATA:  Trauma to the right hip.  Concern for fracture. EXAM: CT OF THE RIGHT HIP WITHOUT CONTRAST TECHNIQUE: Multidetector CT imaging of the right hip was performed according to the standard protocol. Multiplanar CT image reconstructions were also generated. RADIATION DOSE REDUCTION: This exam was performed according to the departmental dose-optimization program which includes automated exposure control, adjustment of the mA and/or kV according to patient size and/or use of iterative reconstruction technique. COMPARISON:  Radiograph dated 09/24/2022. FINDINGS: Bones/Joint/Cartilage There is no acute fracture or dislocation. The bones are osteopenic. Severe arthritic changes of the right hip with subcortical cystic changes of the femoral head and acetabulum. There is narrowing of the joint space with near bone on bone contact. No effusion. Ligaments Suboptimally assessed by CT. Muscles and  Tendons No intramuscular fluid collection or hematoma. Soft tissues Contusion of the subcutaneous soft tissues of the lateral hip. No large hematoma or fluid collection. IMPRESSION: 1. No acute fracture or dislocation. 2. Severe arthritic changes of the right hip. Electronically Signed   By: Elgie Collard M.D.   On: 09/24/2022 01:54   CT Head Wo Contrast  Result Date: 09/24/2022 CLINICAL DATA:  Trauma EXAM: CT HEAD WITHOUT CONTRAST CT CERVICAL SPINE WITHOUT CONTRAST TECHNIQUE: Multidetector CT imaging of the head and cervical spine was performed following the standard protocol without intravenous contrast. Multiplanar CT image reconstructions of the cervical spine were also generated. RADIATION DOSE REDUCTION: This exam was performed according to the departmental dose-optimization program which includes automated exposure control, adjustment of the mA and/or kV according to patient size and/or use of iterative reconstruction technique. COMPARISON:  Head CT 09/10/2022 FINDINGS: CT HEAD FINDINGS Brain: There is no mass, hemorrhage or extra-axial collection. There is generalized atrophy without lobar predilection. There is hypoattenuation of the periventricular white matter, most commonly indicating chronic ischemic microangiopathy. Vascular: No abnormal hyperdensity of the major intracranial arteries or dural venous sinuses. No intracranial atherosclerosis. Skull: The visualized skull base, calvarium and extracranial soft tissues are normal. Ground-glass focus in the frontal calvarium, possibly fibrous dysplasia. Sinuses/Orbits: No fluid levels or advanced mucosal thickening of the visualized paranasal sinuses. No mastoid or middle ear effusion. The orbits are normal. CT CERVICAL SPINE FINDINGS Alignment: No static subluxation. Facets are aligned. Occipital condyles are normally positioned. Skull base and vertebrae: No acute fracture. Soft tissues and spinal canal: No prevertebral fluid or swelling. No visible  canal hematoma. Disc levels: No advanced spinal canal or neural foraminal stenosis. Upper chest: No pneumothorax, pulmonary nodule or pleural effusion. Other: Normal visualized paraspinal cervical soft tissues. IMPRESSION: 1. No acute intracranial abnormality. 2. Chronic ischemic microangiopathy and generalized atrophy. 3. No acute fracture or static subluxation of the cervical spine. Electronically Signed   By: Deatra Robinson M.D.   On: 09/24/2022 00:37   CT Cervical Spine Wo Contrast  Result Date: 09/24/2022 CLINICAL DATA:  Trauma EXAM: CT HEAD WITHOUT CONTRAST CT CERVICAL SPINE WITHOUT CONTRAST TECHNIQUE: Multidetector CT imaging of the head and cervical spine was performed following the standard protocol without intravenous contrast. Multiplanar CT image reconstructions of the cervical spine were also generated. RADIATION DOSE REDUCTION: This exam was performed according to the departmental dose-optimization program which includes automated exposure control, adjustment of the mA and/or kV according to patient size and/or use of iterative reconstruction technique. COMPARISON:  Head CT 09/10/2022 FINDINGS: CT HEAD FINDINGS Brain: There is no mass, hemorrhage or extra-axial collection. There is generalized atrophy without lobar predilection. There is hypoattenuation of the periventricular white matter, most commonly indicating chronic ischemic microangiopathy. Vascular: No abnormal hyperdensity of the major intracranial arteries or dural venous sinuses. No intracranial atherosclerosis. Skull: The visualized skull base, calvarium and extracranial soft tissues are normal. Ground-glass focus in the frontal calvarium, possibly fibrous dysplasia. Sinuses/Orbits: No fluid levels or advanced mucosal thickening of the visualized paranasal sinuses. No mastoid or middle ear effusion. The orbits are normal. CT CERVICAL SPINE FINDINGS Alignment: No static subluxation. Facets are aligned. Occipital condyles are normally  positioned. Skull base and vertebrae: No acute fracture. Soft tissues and spinal canal: No prevertebral fluid or swelling. No visible canal hematoma. Disc levels: No advanced spinal canal or neural foraminal stenosis. Upper chest: No pneumothorax, pulmonary nodule or pleural effusion. Other: Normal visualized paraspinal cervical soft tissues. IMPRESSION: 1. No acute intracranial abnormality. 2. Chronic ischemic microangiopathy and generalized atrophy. 3. No acute fracture or static subluxation of the cervical spine. Electronically Signed   By: Deatra Robinson M.D.   On: 09/24/2022 00:37   DG Hips Bilat W or Wo Pelvis 3-4 Views  Result Date: 09/24/2022 CLINICAL DATA:  Status post fall. EXAM: DG HIP (WITH OR WITHOUT PELVIS) 3-4V BILAT COMPARISON:  September 10, 2022 FINDINGS: There is no evidence of an acute hip fracture or dislocation. Moderate severity degenerative changes are seen involving the right hip, in the form of joint space narrowing and acetabular sclerosis. IMPRESSION: Moderate severity degenerative changes involving the right hip without evidence of an acute osseous abnormality. Electronically Signed   By: Aram Candela M.D.   On: 09/24/2022 00:35   DG Chest Portable 1 View  Result Date: 09/24/2022 CLINICAL DATA:  Status post fall. EXAM: PORTABLE CHEST 1 VIEW COMPARISON:  Jun 05, 2022 FINDINGS: The cardiac silhouette is mildly enlarged and unchanged in size. There is marked severity calcification of the thoracic aorta. Mild, diffuse, chronic appearing increased lung markings are seen without evidence of acute infiltrate, pleural effusion or pneumothorax. No acute osseous abnormalities are identified. IMPRESSION: Chronic appearing increased lung markings without evidence of acute or active cardiopulmonary disease. Electronically Signed   By: Aram Candela M.D.   On: 09/24/2022 00:32   RESULTS  BASIC METABOLIC PANEL GLUCOSE 91 mg/dL 45-409 Final  Non-fasting reference interval UREA  NITROGEN (BUN) 26 mg/dL 8-11 H Final CREATININE 1.11 mg/dL 9.14-7.82 H Final EGFR 48 mL/min/1.73 m2 > OR = 60 L Final BUN/CREATININE RATIO 23 (calc) 6-22 H Final  SODIUM 137 mmol/L 135-146 Final POTASSIUM 4.6 mmol/L 3.5-5.3 Final CHLORIDE 100 mmol/L 98-110 Final CARBON DIOXIDE 30 mmol/L 20-32 Final CALCIUM 9.8 mg/dL 2.9-52.8 Fina  CBC (H/H, RBC, INDICES, WBC, PLT) WHITE BLOOD CELL COUNT 5.8 Thousand/u L 3.8-10.8 Final RED BLOOD CELL COUNT 3.21 Million/uL 3.80-5.10 L Final HEMOGLOBIN 9.5 g/dL 41.3-24.4 L Final HEMATOCRIT 29.7 % 35.0-45.0 L Final MCV 92.5 fL 80.0-100.0 Final MCH 29.6 pg 27.0-33.0 Final MCHC 32.0 g/dL 01.0-27.2 Final  Assessment/Plan  1. Major neurocognitive disorder [facility])  Functional Assessment Staging Scale: 7a - Ability to speak limited to approximately a half-dozen different intelligible words or fewer in an average day or the course of an intensive interview.  Cont with supportive care  Encourage patient to use walker at all times  2. Anxiety  Pt refuses care at times Recently started on cymbalta   3. Primary osteoarthritis involving multiple joints  Pt c/o knee pain  Cont with noro, Aspercreme  4  Iron deficiency anemia IRON, TIBC AND FERRITIN PANEL IRON, TOTAL 41 mcg/dL 53-664 L Final IRON BINDING CAPACITY 304 mcg/dL (calc) 403-474 Final % SATURATION 13 % (calc) 16-45 L Final FERRITIN 68 ng/mL 16-288 Fina Cont with iron supplements   5. Age related osteoporosis, unspecified pathological fracture presence Cont with fosamax, cal ,c it d   6. Bilateral leg edema  Cont with compression stockings Cont with lasix, potassium supplements    Family/ staff Communication: care plan discussed with the nursing staff    30 Total time spent for obtaining history,  performing a medically appropriate examination and evaluation, reviewing the tests,ordering  tests,  documenting clinical information in the electronic or other health record, independently  interpreting results ,care coordination (not separately reported)

## 2023-01-06 ENCOUNTER — Encounter: Payer: Self-pay | Admitting: Sports Medicine

## 2023-02-10 ENCOUNTER — Non-Acute Institutional Stay (SKILLED_NURSING_FACILITY): Payer: PPO | Admitting: Nurse Practitioner

## 2023-02-10 ENCOUNTER — Encounter: Payer: Self-pay | Admitting: Nurse Practitioner

## 2023-02-10 DIAGNOSIS — K5900 Constipation, unspecified: Secondary | ICD-10-CM

## 2023-02-10 DIAGNOSIS — R269 Unspecified abnormalities of gait and mobility: Secondary | ICD-10-CM | POA: Diagnosis not present

## 2023-02-10 DIAGNOSIS — E785 Hyperlipidemia, unspecified: Secondary | ICD-10-CM

## 2023-02-10 DIAGNOSIS — R4189 Other symptoms and signs involving cognitive functions and awareness: Secondary | ICD-10-CM | POA: Diagnosis not present

## 2023-02-10 DIAGNOSIS — N1831 Chronic kidney disease, stage 3a: Secondary | ICD-10-CM

## 2023-02-10 DIAGNOSIS — M85852 Other specified disorders of bone density and structure, left thigh: Secondary | ICD-10-CM | POA: Diagnosis not present

## 2023-02-10 DIAGNOSIS — I350 Nonrheumatic aortic (valve) stenosis: Secondary | ICD-10-CM | POA: Diagnosis not present

## 2023-02-10 DIAGNOSIS — R609 Edema, unspecified: Secondary | ICD-10-CM

## 2023-02-10 DIAGNOSIS — E039 Hypothyroidism, unspecified: Secondary | ICD-10-CM

## 2023-02-10 DIAGNOSIS — R350 Frequency of micturition: Secondary | ICD-10-CM | POA: Diagnosis not present

## 2023-02-10 DIAGNOSIS — D638 Anemia in other chronic diseases classified elsewhere: Secondary | ICD-10-CM

## 2023-02-10 NOTE — Progress Notes (Signed)
 Location:   SNF [facility] Room Number: 66B Place of Service:  SNF (31) Provider: Larwance Claxton Levitz NP  Sherlynn Madden, MD  Patient Care Team: Sherlynn Madden, MD as PCP - General (Internal Medicine) [name] Keslee Harrington X, NP as Nurse Practitioner (Nurse Practitioner) Camillo Golas, MD as Consulting Physician (Ophthalmology) Sheril Coy, MD as Consulting Physician (Orthopedic Surgery) Jordan, [name], MD as Consulting Physician (Dermatology) Obie Princella HERO, MD (Inactive) as Consulting Physician (Gastroenterology) Ethyl Lenis, MD as Consulting Physician (General Surgery)  Extended Emergency Contact Information Primary Emergency Contact: Oliveto,[name] Mobile Phone: [phone] Relation: Son Secondary Emergency Contact: Kennyth [facility], [place]  of [place] Phone: [phone] Relation: Daughter  Code Status:  DNR Goals of care: Advanced Directive information    12/17/2022    4:58 PM  Advanced Directives  Does Patient Have a Medical Advance Directive? Yes  Type of Estate Agent of [place];Living will;Out of facility DNR (pink MOST or yellow form)  Does patient want to make changes to medical advance directive? No - Patient declined  Copy of Healthcare Power of Attorney in Chart? Yes - validated most recent copy scanned in chart (See row information)  Pre-existing out of facility DNR order (yellow form or pink MOST form) Pink MOST form placed in chart (order not valid for inpatient use)     Chief Complaint  Patient presents with   Medical Management of Chronic Issues    HPI:  Pt is a 89 y.o. female seen today for medical management of chronic diseases.    Recurrent falls, ambulates with walker, unsteady gait.   OA: lower back pain, R knee, R shoulder, on Norco, Lidoderm , Voltaren , Tylenol , 02/10/22 T11, L4 compression fx uncertain age.  Edema BLE, no respiratory symptoms presently. Echo  2023 severe AS. 12/18/22 BNP 284, improved on  Furosemide  10mg  +Kcl MWF OP, on Alendronate, Ca, Vit D, last DEXA 01/05/20 t score -2.1, DEXA if HPOA desires.  Constipation, stable, on Mirlax, Senokot S Hx of syncope,  aortic valve stenosis CKD Bun/creat 26/1.11 12/30/22 Hyperlipidemia, LDL 79 12/18/22,  taking Omega 3 Hypothyroidism, taking Levothyroxine , TSH 1.63 12/18/22 Dementia, poor safety awareness,  no behavioral issues, on ASA 81mg               Anemia, baseline Hgb 9-10, on Iron, Vit B level wnl.              Urinary frequency, OAB, not new, up to 3-4x per night, declined urology consultation             Gait abnormality, ambulates with walker, risk of falling.          Past Medical History:  Diagnosis Date   Cerebral atrophy (HCC) 01/21/2013   Hyperlipidemia    Hypothyroid    Osteoarthrosis, unspecified whether generalized or localized, unspecified site    Osteoporosis, unspecified    Other and unspecified hyperlipidemia    Other generalized ischemic cerebrovascular disease 01/21/2013   small vessel disease   Syncope and collapse    Umbilical hernia without mention of obstruction or gangrene    Unspecified hearing loss    Unspecified hypothyroidism    Unspecified vitamin D  deficiency    Ventral hernia, unspecified, without mention of obstruction or gangrene    Past Surgical History:  Procedure Laterality Date   CATARACT EXTRACTION W/ INTRAOCULAR LENS  IMPLANT, BILATERAL  2012   Dr. Camillo   HERNIA REPAIR  01/2008  laparoscopic Dr. Alm Angle   INGUINAL HERNIA REPAIR N/A 05/10/2019   Procedure: LAPAROSCOPIC FEMORAL HERNIA REPAIR WITH MESH;  Surgeon: Rubin Calamity, MD;  Location: [facility] OR;  Service: General;  Laterality: N/A;   KNEE SURGERY  2008   Dr. Roena  left   TOE AMPUTATION  1986   little toe right foot  Dr. Teresa; [place]    Allergies  Allergen Reactions   Dust Mite Extract    Mushroom Extract Complex (Do Not Select) Other (See Comments)     Allergic, per paperwork from facility   Penicillins Diarrhea and Other (See Comments)    Allergic, per paperwork from facility    Allergies as of 02/10/2023       Reactions   Dust Mite Extract    Mushroom Extract Complex (do Not Select) Other (See Comments)   Allergic, per paperwork from facility   Penicillins Diarrhea, Other (See Comments)   Allergic, per paperwork from facility        Medication List        Accurate as of February 10, 2023 11:59 PM. If you have any questions, ask your nurse or doctor.          acetaminophen  500 MG tablet Commonly known as: TYLENOL  Take 2 tablets (1,000 mg total) by mouth every 6 (six) hours as needed.   Aspercreme Lidocaine  4 % Generic drug: lidocaine  Place 1 patch onto the skin daily. Apply to SOLE OF RIGHT FOOT   aspirin  81 MG tablet Take 81 mg by mouth daily.   Calcium  Carbonate-Vitamin D  600-200 MG-UNIT Tabs Take 1 tablet by mouth 2 (two) times daily.   Fish Oil 1200 MG Caps Take 1 capsule by mouth daily.   FLEET ENEMA RE Insert 1 unit rectally as needed for constipation repeat until positive results   Fosamax 70 MG tablet Generic drug: alendronate Take 70 mg by mouth once a week. related to AGE-RELATED OSTEOPOROSIS WITHOUT CURRENT PATHOLOGICAL FRACTURE (M81.0)   HYDROcodone -acetaminophen  5-325 MG tablet Commonly known as: NORCO/VICODIN Take 0.5 tablets by mouth 2 (two) times daily. Two listings per [employer] at [facility]   hydrocortisone  2.5 % rectal cream Commonly known as: Procto-Med HC  Place 1 Application rectally as needed for hemorrhoids or anal itching.   levothyroxine  100 MCG tablet Commonly known as: SYNTHROID  Take 100 mcg by mouth daily.   multivitamin with minerals Tabs tablet Take 1 tablet by mouth daily.   Nyamyc powder Generic drug: nystatin Apply 1 Application topically.   sennosides-docusate sodium  8.6-50 MG tablet Commonly known as: SENOKOT-S Take 1 tablet by mouth  daily.   Zinc  Oxide 10 % Oint Apply 1 Application topically as directed. Every shift to buttocks for redness        Review of Systems  Constitutional:  Negative for appetite change, fatigue and fever.  HENT:  Positive for hearing loss. Negative for congestion and voice change.   Respiratory:  Negative for cough.   Cardiovascular:  Negative for leg swelling.  Gastrointestinal:  Negative for abdominal pain.  Genitourinary:  Positive for frequency. Negative for dysuria and urgency.  Musculoskeletal:  Positive for arthralgias, back pain and gait problem.       Lower back pain, R knee  Skin:  Negative for color change.  Neurological:  Negative for speech difficulty, weakness and light-headedness.       Memory lapses.   Psychiatric/Behavioral:  Negative for behavioral problems and sleep disturbance. The patient is nervous/anxious.     Immunization History  Administered Date(s) Administered   Fluad Quad(high Dose 65+) 10/29/2022   Influenza Whole 10/28/2011, 11/10/2012, 10/29/2017   Influenza, High Dose Seasonal PF 11/05/2016, 11/11/2018, 11/12/2020   Influenza-Unspecified 11/14/2013, 10/26/2014, 11/08/2015, 11/09/2019, 11/18/2021   Moderna Covid-19 Fall Seasonal Vaccine 69yrs & older 11/12/2022   Moderna Sars-Covid-2 Vaccination 01/31/2019, 02/28/2019, 12/06/2019, 06/26/2020   PFIZER(Purple Top)SARS-COV-2 Vaccination 10/16/2020   PPD Test 02/27/2021   Pfizer Covid-19 Vaccine Bivalent Booster 31yrs & up 11/28/2021   Pneumococcal Conjugate-13 02/05/2017   Pneumococcal Polysaccharide-23 02/08/2007, 10/14/2022   Td 01/28/1991, 06/12/2017   Tdap 06/12/2017   Zoster Recombinant(Shingrix) 07/09/2017, 09/15/2017   Zoster, Live 12/27/2012   Pertinent  Health Maintenance Due  Topic Date Due   INFLUENZA VACCINE  Completed   DEXA SCAN  Completed      09/02/2019   12:05 PM 04/20/2020   10:57 AM 02/15/2021   12:46 PM 02/16/2021    2:41 AM 02/18/2022   10:36 AM  Fall Risk  Falls in the  past year? 1 0   1  Was there an injury with Fall? 1    1  Fall Risk Category Calculator 2    3  Fall Risk Category (Retired) Moderate      (RETIRED) Patient Fall Risk Level   Low fall risk High fall risk   Patient at Risk for Falls Due to     History of fall(s);Impaired balance/gait;Impaired mobility  Fall risk Follow up     Education provided   Functional Status Survey:    Vitals:   02/10/23 1511 02/12/23 1215  BP: (!) 143/76 (!) 132/50  Pulse: 80   Resp: 20   Temp: 99.9 F (37.7 C)   SpO2: 97%   Weight: 115 lb 14.4 oz (52.6 kg)    Body mass index is 24.22 kg/m. Physical Exam Constitutional:      Appearance: Normal appearance.  HENT:     Head: Normocephalic and atraumatic.     Mouth/Throat:     Mouth: Mucous membranes are moist.  Eyes:     Extraocular Movements: Extraocular movements intact.     Conjunctiva/sclera: Conjunctivae normal.     Pupils: Pupils are equal, round, and reactive to light.  Cardiovascular:     Rate and Rhythm: Normal rate and regular rhythm.     Heart sounds: Murmur heard.  Pulmonary:     Effort: Pulmonary effort is normal.     Breath sounds: Normal breath sounds. No rales.  Abdominal:     General: Bowel sounds are normal. There is no distension.     Palpations: Abdomen is soft.     Tenderness: There is no abdominal tenderness. There is no right CVA tenderness, left CVA tenderness, guarding or rebound.  Musculoskeletal:     Cervical back: Normal range of motion and neck supple.     Right lower leg: Edema present.     Left lower leg: Edema present.     Comments: Lower back pain R shoulder pain, chronic R knee pain  Skin:    General: Skin is warm and dry.     Findings: Bruising present.     Comments: Right forehead hematoma, large bruise dorsum left hand  Neurological:     General: No focal deficit present.     Mental Status: She is alert and oriented to person, place, and time. Mental status is at baseline.     Gait: Gait abnormal.   Psychiatric:     Comments: Appears anxious.      Labs reviewed: Recent  Labs    06/05/22 1715 09/10/22 1710 09/16/22 0000 09/25/22 0000 10/02/22 0000  NA 139 138 139 136* 138  K 3.8 3.4* 4.2 4.4 4.5  CL 106 103 105 102 105  CO2 22 26 23* 25* 25*  GLUCOSE 118* 107*  --   --   --   BUN 17 26* 26* 25* 23*  CREATININE 0.99 1.09* 1.0 1.6* 1.0  CALCIUM  9.3 9.4 8.9 9.2 8.9   Recent Labs    06/05/22 1715 09/10/22 1710 09/25/22 0000 10/02/22 0000  AST 22 21 22 20   ALT 15 16 15 14   ALKPHOS 69 65 72 100  BILITOT 0.5 0.3  --   --   PROT 7.4 7.5  --   --   ALBUMIN  3.8 3.9 3.7 3.9   Recent Labs    06/05/22 1715 09/10/22 1710 09/25/22 0000 10/02/22 0000  WBC 8.6 9.4 6.3 7.0  NEUTROABS 5.7 6.5 4,448.00 4,095.00  HGB 11.8* 11.4* 9.1* 9.8*  HCT 37.9 36.2 28* 30*  MCV 93.6 92.6  --   --   PLT 347 347 402* 465*   Lab Results  Component Value Date   TSH 2.25 03/20/2022   Lab Results  Component Value Date   HGBA1C 5.9 (H) 01/31/2020   Lab Results  Component Value Date   CHOL 205 (H) 11/22/2020   HDL 65 11/22/2020   LDLCALC 110 (H) 11/22/2020   TRIG 180 (H) 11/22/2020   CHOLHDL 3.2 11/22/2020    Significant Diagnostic Results in last 30 days:  No results found.  Assessment/Plan  CKD (chronic kidney disease) stage 3, GFR 30-59 ml/min (HCC) Bun/creat 26/1.11 12/30/22  Dyslipidemia LDL 79 12/18/22,  taking Omega 3  Hypothyroidism  taking Levothyroxine , TSH 1.63 12/18/22  Cognitive impairment  poor safety awareness,  no behavioral issues, on ASA 81mg    Anemia, chronic disease baseline Hgb 9-10  Urinary frequency OAB, not new, up to 3-4x per night, declined urology consultation  Abnormality of gait  ambulates with walker, risk of falling.   Aortic valve stenosis Hx of syncope,  aortic valve stenosis  Constipation stable, on Mirlax, Senokot S  Osteopenia on Alendronate, Ca, Vit D, last DEXA 01/05/20 t score -2.1, DEXA if HPOA desires.    Edema Edema BLE, no respiratory symptoms presently. Echo 2023 severe AS. 12/18/22 BNP 284, improved on  Furosemide  10mg  +Kcl MWF  Osteoarthritis, multiple sites OA: lower back pain, R knee, R shoulder, on Norco, Lidoderm , Voltaren , Tylenol , 02/10/22 T11, L4 compression fx uncertain age.    Family/ staff Communication: plan of care reviewed with the patient and charge nurse.   Labs/tests ordered:  none  Time spend 30 minutes.

## 2023-02-10 NOTE — Assessment & Plan Note (Signed)
 OA: lower back pain, R knee, R shoulder, on Norco, Lidoderm, Voltaren, Tylenol, 02/10/22 T11, L4 compression fx uncertain age.

## 2023-02-10 NOTE — Assessment & Plan Note (Signed)
 Edema BLE, no respiratory symptoms presently. Echo 2023 severe AS. 12/18/22 BNP 284, improved on  Furosemide 10mg  +Kcl MWF

## 2023-02-10 NOTE — Assessment & Plan Note (Signed)
 Bun/creat 26/1.11 12/30/22

## 2023-02-10 NOTE — Assessment & Plan Note (Signed)
OAB, not new, up to 3-4x per night, declined urology consultation

## 2023-02-10 NOTE — Assessment & Plan Note (Signed)
stable, on Mirlax, Senokot S

## 2023-02-10 NOTE — Assessment & Plan Note (Signed)
 on Alendronate, Ca, Vit D, last DEXA 01/05/20 t score -2.1, DEXA if HPOA desires.

## 2023-02-10 NOTE — Assessment & Plan Note (Signed)
 taking Levothyroxine, TSH 1.63 12/18/22

## 2023-02-10 NOTE — Assessment & Plan Note (Signed)
 LDL 79 12/18/22,  taking Omega 3

## 2023-02-10 NOTE — Assessment & Plan Note (Signed)
 poor safety awareness,  no behavioral issues, on ASA 81mg 

## 2023-02-10 NOTE — Assessment & Plan Note (Signed)
 ambulates with walker, risk of falling.

## 2023-02-10 NOTE — Assessment & Plan Note (Signed)
Hx of syncope,  aortic valve stenosis 

## 2023-02-10 NOTE — Assessment & Plan Note (Signed)
 baseline Hgb 9-10

## 2023-02-19 ENCOUNTER — Non-Acute Institutional Stay: Payer: PPO | Admitting: Nurse Practitioner

## 2023-02-19 ENCOUNTER — Encounter: Payer: Self-pay | Admitting: Nurse Practitioner

## 2023-02-19 DIAGNOSIS — Z0000 Encounter for general adult medical examination without abnormal findings: Secondary | ICD-10-CM | POA: Diagnosis not present

## 2023-02-25 ENCOUNTER — Encounter: Payer: Self-pay | Admitting: Nurse Practitioner

## 2023-02-25 NOTE — Progress Notes (Signed)
Subjective:   Krista Bowman is a 89 y.o. female who presents for Medicare Annual (Subsequent) preventive examination.  Visit Complete: In person  Patient Medicare AWV questionnaire was completed by the patient on 02/19/23; I have confirmed that all information answered by patient is correct and no changes since this date.  Cardiac Risk Factors include: advanced age (>47men, >55 women);dyslipidemia;hypertension     Objective:    Today's Vitals   02/19/23 1047 02/25/23 1415  BP: 127/76   Pulse: 85   SpO2: 97%   Weight: 117 lb 14.4 oz (53.5 kg)   Height: 4\' 10"  (1.473 m)   PainSc: 0-No pain 4    Body mass index is 24.64 kg/m.     02/19/2023   10:53 AM 12/17/2022    4:58 PM 11/12/2022    2:23 PM 09/30/2022   12:03 PM 09/11/2022    4:03 PM 06/05/2022    4:33 PM 05/19/2022   11:19 AM  Advanced Directives  Does Patient Have a Medical Advance Directive? Yes Yes Yes Yes Yes Yes Yes  Type of Estate agent of [place];Living will;Out of facility DNR (pink MOST or yellow form) Healthcare Power of [place];Living will;Out of facility DNR (pink MOST or yellow form) Healthcare Power of [place];Living will;Out of facility DNR (pink MOST or yellow form) Healthcare Power of [place];Living will;Out of facility DNR (pink MOST or yellow form) Healthcare Power of [place];Out of facility DNR (pink MOST or yellow form) Healthcare Power of [place];Living will Healthcare Power of [place];Living will;Out of facility DNR (pink MOST or yellow form)  Does patient want to make changes to medical advance directive? No - Patient declined No - Patient declined No - Patient declined No - Patient declined No - Patient declined  No - Patient declined  Copy of Healthcare Power of Attorney in Chart? Yes - validated most recent copy scanned in chart (See row information) Yes - validated most recent copy scanned in chart (See row information) Yes - validated most recent copy scanned in chart (See  row information) Yes - validated most recent copy scanned in chart (See row information) Yes - validated most recent copy scanned in chart (See row information)  Yes - validated most recent copy scanned in chart (See row information)  Pre-existing out of facility DNR order (yellow form or pink MOST form) Pink MOST form placed in chart (order not valid for inpatient use) Pink MOST form placed in chart (order not valid for inpatient use) Pink MOST form placed in chart (order not valid for inpatient use) Pink MOST form placed in chart (order not valid for inpatient use) Pink MOST form placed in chart (order not valid for inpatient use)  Pink MOST form placed in chart (order not valid for inpatient use)    Current Medications (verified) Outpatient Encounter Medications as of 02/19/2023  Medication Sig   acetaminophen (TYLENOL) 500 MG tablet Take 2 tablets (1,000 mg total) by mouth every 6 (six) hours as needed.   alendronate (FOSAMAX) 70 MG tablet Take 70 mg by mouth once a week. related to AGE-RELATED OSTEOPOROSIS WITHOUT CURRENT PATHOLOGICAL FRACTURE (M81.0)   aspirin 81 MG tablet Take 81 mg by mouth daily.   Calcium Carbonate-Vitamin D 600-200 MG-UNIT TABS Take 1 tablet by mouth 2 (two) times daily.   diclofenac Sodium (VOLTAREN) 1 % GEL Apply 4 g topically every 6 (six) hours as needed (apply to right knee).   DULoxetine (CYMBALTA) 30 MG capsule Take 30 mg by mouth daily.  ferrous sulfate 325 (65 FE) MG EC tablet Take 325 mg by mouth daily.   furosemide (LASIX) 20 MG tablet Take 10 mg by mouth every Monday, Wednesday, and Friday.   HYDROcodone-acetaminophen (NORCO/VICODIN) 5-325 MG tablet Take 0.5 tablets by mouth 2 (two) times daily. Two listings per [employer] at [facility]   hydrocortisone (PROCTO-MED [facility]) 2.5 % rectal cream Place 1 Application rectally as needed for hemorrhoids or anal itching.   levothyroxine (SYNTHROID) 100 MCG tablet Take 100 mcg by mouth daily.   lidocaine  (ASPERCREME LIDOCAINE) 4 % Place 1 patch onto the skin daily. Apply to SOLE OF RIGHT FOOT   Multiple Vitamin (MULTIVITAMIN WITH MINERALS) TABS Take 1 tablet by mouth daily.   Omega-3 Fatty Acids (FISH OIL) 1200 MG CAPS Take 1 capsule by mouth daily.   potassium chloride (KLOR-CON) 10 MEQ tablet Take 10 mEq by mouth every Monday, Wednesday, and Friday.   sennosides-docusate sodium (SENOKOT-S) 8.6-50 MG tablet Take 1 tablet by mouth daily.   Sodium Phosphates (FLEET ENEMA RE) Insert 1 unit rectally as needed for constipation repeat until positive results   Zinc Oxide 10 % OINT Apply 1 Application topically as directed. Every shift to buttocks for redness   NYAMYC powder Apply 1 Application topically. (Patient not taking: Reported on 02/19/2023)   No facility-administered encounter medications on file as of 02/19/2023.    Allergies (verified) Dust mite extract, Mushroom extract complex (do not select), and Penicillins   History: Past Medical History:  Diagnosis Date   Cerebral atrophy (HCC) 01/21/2013   Hyperlipidemia    Hypothyroid    Osteoarthrosis, unspecified whether generalized or localized, unspecified site    Osteoporosis, unspecified    Other and unspecified hyperlipidemia    Other generalized ischemic cerebrovascular disease 01/21/2013   small vessel disease   Syncope and collapse    Umbilical hernia without mention of obstruction or gangrene    Unspecified hearing loss    Unspecified hypothyroidism    Unspecified vitamin D deficiency    Ventral hernia, unspecified, without mention of obstruction or gangrene    Past Surgical History:  Procedure Laterality Date   CATARACT EXTRACTION W/ INTRAOCULAR LENS  IMPLANT, BILATERAL  2012   Dr. Hazle Quant   HERNIA REPAIR  01/2008   laparoscopic Dr. Ovidio Kin   INGUINAL HERNIA REPAIR N/A 05/10/2019   Procedure: LAPAROSCOPIC FEMORAL HERNIA REPAIR WITH MESH;  Surgeon: Axel Filler, MD;  Location[facility] OR;  Service: General;  Laterality:  N/A;   KNEE SURGERY  2008   Dr. Berneice Heinrich  left   TOE AMPUTATION  1986   little toe right foot  Dr. Cliffton Asters; [facility]   Family History  Problem Relation Age of Onset   Melanoma Sister    Cancer Sister    Social History   Socioeconomic History   Marital status: Widowed    Spouse name: Not on file   Number of children: Not on file   Years of education: Not on file   Highest education level: Not on file  Occupational History   Occupation: retired Nurse, mental health: RETIRED  Tobacco Use   Smoking status: Never   Smokeless tobacco: Never  Vaping Use   Vaping status: Never Used  Substance and Sexual Activity   Alcohol use: No   Drug use: No   Sexual activity: Never  Other Topics Concern   Not on file  Social History Narrative   Lives at [facility]  Widow   Living Will   Never smoked   No alcohol    Social Drivers of Corporate investment banker Strain: Low Risk  (01/08/2017)   Overall Financial Resource Strain (CARDIA)    Difficulty of Paying Living Expenses: Not hard at all  Food Insecurity: No Food Insecurity (01/08/2017)   Hunger Vital Sign    Worried About Running Out of Food in the Last Year: Never true    Ran Out of Food in the Last Year: Never true  Transportation Needs: No Transportation Needs (01/08/2017)   PRAPARE - Administrator, Civil Service (Medical): No    Lack of Transportation (Non-Medical): No  Physical Activity: Insufficiently Active (01/08/2017)   Exercise Vital Sign    Days of Exercise per Week: 6 days    Minutes of Exercise per Session: 20 min  Stress: No Stress Concern Present (01/08/2017)   [employer] of Occupational Health - Occupational Stress Questionnaire    Feeling of Stress : Not at all  Social Connections: Somewhat Isolated (01/08/2017)   Social Connection and Isolation Panel [NHANES]    Frequency of Communication with Friends and Family: Twice a week    Frequency of Social  Gatherings with Friends and Family: Three times a week    Attends Religious Services: More than 4 times per year    Active Member of Clubs or Organizations: No    Attends Banker Meetings: Never    Marital Status: Widowed    Tobacco Counseling Counseling given: Not Answered   Clinical Intake:  Pre-visit preparation completed: Yes  Pain : 0-10 Pain Score: 4  Pain Type: Chronic pain Pain Location: Back Pain Orientation: Mid Pain Descriptors / Indicators: Aching Pain Onset: More than a month ago Pain Frequency: Constant Pain Relieving Factors: Norco, rest Effect of Pain on Daily Activities: less walking  Pain Relieving Factors: Norco, rest  BMI - recorded: 24.64 Nutritional Status: BMI of 19-24  Normal Nutritional Risks: Unintentional weight loss (about #2-3Ibs in the past 3-4 months.) Diabetes: No  How often do you need to have someone help you when you read instructions, pamphlets, or other written materials from your doctor or pharmacy?: 4 - Often What is the last grade level you completed in school?: college  Interpreter Needed?: No  Information entered by :: Lorey Pallett Nedra Hai NP   Activities of Daily Living    02/25/2023    2:23 PM  In your present state of health, do you have any difficulty performing the following activities:  Hearing? 1  Vision? 0  Difficulty concentrating or making decisions? 1  Walking or climbing stairs? 1  Dressing or bathing? 1  Doing errands, shopping? 1  Preparing Food and eating ? N  Using the Toilet? N  In the past six months, have you accidently leaked urine? Y  Do you have problems with loss of bowel control? N  Managing your Medications? Y  Managing your Finances? Y  Housekeeping or managing your Housekeeping? Y    Patient Care Team: Venita Sheffield, MD as PCP - General (Internal Medicine) [name], [facility] Conrad Zajkowski X, NP as Nurse Practitioner (Nurse Practitioner) Nelson Chimes, MD as Consulting  Physician (Ophthalmology) Marcene Corning, MD as Consulting Physician (Orthopedic Surgery) [place], [name], MD as Consulting Physician (Dermatology) Hart Carwin, MD (Inactive) as Consulting Physician (Gastroenterology) Ovidio Kin, MD as Consulting Physician (General Surgery)  Indicate any recent Medical Services you may have received from other than [facility] providers in the past  year (date may be approximate).     Assessment:   This is a routine wellness examination for [facility].  Hearing/Vision screen No results found.   Goals Addressed             This Visit's Progress    Maintain Mobility and Function       Evidence-based guidance:  Acknowledge and validate impact of pain, loss of strength and potential disfigurement (hand osteoarthritis) on mental health and daily life, such as social isolation, anxiety, depression, impaired sexual relationship and   injury from falls.  Anticipate referral to physical or occupational therapy for assessment, therapeutic exercise and recommendation for adaptive equipment or assistive devices; encourage participation.  Assess impact on ability to perform activities of daily living, as well as engage in sports and leisure events or requirements of work or school.  Provide anticipatory guidance and reassurance about the benefit of exercise to maintain function; acknowledge and normalize fear that exercise may worsen symptoms.  Encourage regular exercise, at least 10 minutes at a time for 45 minutes per week; consider yoga, water exercise and proprioceptive exercises; encourage use of wearable activity tracker to increase motivation and adherence.  Encourage maintenance or resumption of daily activities, including employment, as pain allows and with minimal exposure to trauma.  Assist patient to advocate for adaptations to the work environment.  Consider level of pain and function, gender, age, lifestyle, patient preference, quality of life, readiness and  ?ocapacity to benefit? when recommending patients for orthopaedic surgery consultation.  Explore strategies, such as changes to medication regimen or activity that enables patient to anticipate and manage flare-ups that increase deconditioning and disability.  Explore patient preferences; encourage exposure to a broader range of activities that have been avoided for fear of experiencing pain.  Identify barriers to participation in therapy or exercise, such as pain with activity, anticipated or imagined pain.  Monitor postoperative joint replacement or any preexisting joint replacement for ongoing pain and loss of function; provide social support and encouragement throughout recovery.   Notes:        Depression Screen    02/25/2023    2:24 PM 05/21/2018    9:52 AM 01/08/2017   11:36 AM 10/28/2016    8:32 AM 04/12/2015    2:57 PM 06/08/2014    3:23 PM 06/23/2013    3:02 PM  PHQ 2/9 Scores  PHQ - 2 Score 0 0 0 0 0 0 0  PHQ- 9 Score    0       Fall Risk    02/18/2022   10:36 AM 04/20/2020   10:57 AM 09/02/2019   12:05 PM 08/04/2019    1:10 PM 04/15/2019   11:38 AM  Fall Risk   Falls in the past year? 1 0 1 1 1   Number falls in past yr: 1 0 0 0 0  Injury with Fall? 1  1 1  0  Risk for fall due to : History of fall(s);Impaired balance/gait;Impaired mobility      Follow up Education provided        MEDICARE RISK AT HOME: Medicare Risk at Home Any stairs in or around the home?: Yes If so, are there any without handrails?: No Home free of loose throw rugs in walkways, pet beds, electrical cords, etc?: Yes Adequate lighting in your home to reduce risk of falls?: Yes Life alert?: No Use of a cane, walker or w/c?: Yes Grab bars in the bathroom?: Yes Shower chair or bench in shower?: Yes Elevated  toilet seat or a handicapped toilet?: Yes  TIMED UP AND GO:  Was the test performed?  Yes  Length of time to ambulate 10 feet: 15 sec Gait slow and steady with assistive device    Cognitive  Function:    02/18/2022    2:30 PM 09/19/2020    1:35 PM 02/03/2017    8:29 AM 01/08/2017   11:42 AM 04/12/2015    3:14 PM  MMSE - Mini Mental State Exam  Not completed:   --    Orientation to time 3 5 5 5 5   Orientation to Place 3 5 5 5 5   Registration 3 3 3 3 3   Attention/ Calculation 5 5 5 5 5   Recall 1 3 3 3 3   Language- name 2 objects 2 2 2 2 2   Language- repeat 1 1 1 1 1   Language- follow 3 step command 3 3 3 3 3   Language- read & follow direction 1 1 1 1 1   Write a sentence 1 1 1 1 1   Copy design 1 1 1 1 1   Total score 24 30 30 30 30         Immunizations Immunization History  Administered Date(s) Administered   Fluad Quad(high Dose 65+) 10/29/2022   Influenza Whole 10/28/2011, 11/10/2012, 10/29/2017   Influenza, High Dose Seasonal PF 11/05/2016, 11/11/2018, 11/12/2020   Influenza-Unspecified 11/14/2013, 10/26/2014, 11/08/2015, 11/09/2019, 11/18/2021   Moderna Covid-19 Fall Seasonal Vaccine 5yrs & older 11/12/2022   Moderna Sars-Covid-2 Vaccination 01/31/2019, 02/28/2019, 12/06/2019, 06/26/2020   PFIZER(Purple Top)SARS-COV-2 Vaccination 10/16/2020   PPD Test 02/27/2021   Pfizer Covid-19 Vaccine Bivalent Booster 61yrs & up 11/28/2021   Pneumococcal Conjugate-13 02/05/2017   Pneumococcal Polysaccharide-23 02/08/2007, 10/14/2022   Td 01/28/1991, 06/12/2017   Tdap 06/12/2017   Zoster Recombinant(Shingrix) 07/09/2017, 09/15/2017   Zoster, Live 12/27/2012    TDAP status: Up to date  Flu Vaccine status: Up to date  Pneumococcal vaccine status: Up to date  Covid-19 vaccine status: Completed vaccines  Qualifies for Shingles Vaccine? Yes   Zostavax completed Yes   Shingrix Completed?: Yes  Screening Tests Health Maintenance  Topic Date Due   Medicare Annual Wellness (AWV)  02/19/2024   DTaP/Tdap/Td (4 - Td or Tdap) 06/13/2027   Pneumonia Vaccine 55+ Years old  Completed   INFLUENZA VACCINE  Completed   DEXA SCAN  Completed   COVID-19 Vaccine  Completed    Zoster Vaccines- Shingrix  Completed   HPV VACCINES  Aged Out    Health Maintenance  There are no preventive care reminders to display for this patient.   Colorectal cancer screening: No longer required.   Mammogram status: No longer required due to aged out.  Bone Density status: Completed 02/21/22. Results reflect: Bone density results: OSTEOPOROSIS. Repeat every 2 years.  Lung Cancer Screening: (Low Dose CT Chest recommended if Age 36-80 years, 20 pack-year currently smoking OR have quit w/in 15years.) does not qualify.   Lung Cancer Screening Referral: NA  Additional Screening:  Hepatitis C Screening: does not qualify;   Vision Screening: Recommended annual ophthalmology exams for early detection of glaucoma and other disorders of the eye. Is the patient up to date with their annual eye exam?  Yes  Who is the provider or what is the name of the office in which the patient attends annual eye exams? [facility] will provide if needed.  If pt is not established with a provider, would they like to be referred to a provider to establish care? No .  Dental Screening: Recommended annual dental exams for proper oral hygiene  Diabetic Foot Exam: NA  Community Resource Referral / Chronic Care Management: CRR required this visit?  No   CCM required this visit?  No     Plan:     I have personally reviewed and noted the following in the patient's chart:   Medical and social history Use of alcohol, tobacco or illicit drugs  Current medications and supplements including opioid prescriptions. Patient is currently taking opioid prescriptions. Information provided to patient regarding non-opioid alternatives. Patient advised to discuss non-opioid treatment plan with their provider. Functional ability and status Nutritional status Physical activity Advanced directives List of other physicians Hospitalizations, surgeries, and ER visits in previous 12 months Vitals Screenings to include  cognitive, depression, and falls Referrals and appointments  In addition, I have reviewed and discussed with patient certain preventive protocols, quality metrics, and best practice recommendations. A written personalized care plan for preventive services as well as general preventive health recommendations were provided to patient.     Daveon Arpino X Priyanka Causey, NP   02/25/2023   After Visit Summary: (In Person-Declined) Patient declined AVS at this time.

## 2023-03-02 ENCOUNTER — Emergency Department (HOSPITAL_COMMUNITY): Payer: PPO

## 2023-03-02 ENCOUNTER — Non-Acute Institutional Stay (SKILLED_NURSING_FACILITY): Payer: Self-pay | Admitting: Sports Medicine

## 2023-03-02 ENCOUNTER — Inpatient Hospital Stay (HOSPITAL_COMMUNITY): Payer: PPO

## 2023-03-02 ENCOUNTER — Encounter: Payer: Self-pay | Admitting: Sports Medicine

## 2023-03-02 DIAGNOSIS — G9340 Encephalopathy, unspecified: Secondary | ICD-10-CM

## 2023-03-02 DIAGNOSIS — Z515 Encounter for palliative care: Secondary | ICD-10-CM

## 2023-03-02 DIAGNOSIS — E876 Hypokalemia: Secondary | ICD-10-CM | POA: Diagnosis not present

## 2023-03-02 DIAGNOSIS — E119 Type 2 diabetes mellitus without complications: Secondary | ICD-10-CM | POA: Diagnosis not present

## 2023-03-02 DIAGNOSIS — Z7982 Long term (current) use of aspirin: Secondary | ICD-10-CM | POA: Diagnosis not present

## 2023-03-02 DIAGNOSIS — R64 Cachexia: Secondary | ICD-10-CM | POA: Diagnosis present

## 2023-03-02 DIAGNOSIS — J189 Pneumonia, unspecified organism: Secondary | ICD-10-CM | POA: Insufficient documentation

## 2023-03-02 DIAGNOSIS — Z7983 Long term (current) use of bisphosphonates: Secondary | ICD-10-CM

## 2023-03-02 DIAGNOSIS — I5033 Acute on chronic diastolic (congestive) heart failure: Secondary | ICD-10-CM | POA: Insufficient documentation

## 2023-03-02 DIAGNOSIS — F0393 Unspecified dementia, unspecified severity, with mood disturbance: Secondary | ICD-10-CM | POA: Diagnosis not present

## 2023-03-02 DIAGNOSIS — J811 Chronic pulmonary edema: Secondary | ICD-10-CM | POA: Diagnosis present

## 2023-03-02 DIAGNOSIS — G319 Degenerative disease of nervous system, unspecified: Secondary | ICD-10-CM | POA: Diagnosis present

## 2023-03-02 DIAGNOSIS — R0603 Acute respiratory distress: Secondary | ICD-10-CM

## 2023-03-02 DIAGNOSIS — I5021 Acute systolic (congestive) heart failure: Secondary | ICD-10-CM | POA: Insufficient documentation

## 2023-03-02 DIAGNOSIS — Z1152 Encounter for screening for COVID-19: Secondary | ICD-10-CM

## 2023-03-02 DIAGNOSIS — R0602 Shortness of breath: Secondary | ICD-10-CM | POA: Diagnosis not present

## 2023-03-02 DIAGNOSIS — E039 Hypothyroidism, unspecified: Secondary | ICD-10-CM | POA: Diagnosis not present

## 2023-03-02 DIAGNOSIS — K439 Ventral hernia without obstruction or gangrene: Secondary | ICD-10-CM | POA: Diagnosis present

## 2023-03-02 DIAGNOSIS — J810 Acute pulmonary edema: Secondary | ICD-10-CM | POA: Diagnosis not present

## 2023-03-02 DIAGNOSIS — I110 Hypertensive heart disease with heart failure: Secondary | ICD-10-CM | POA: Diagnosis not present

## 2023-03-02 DIAGNOSIS — R509 Fever, unspecified: Secondary | ICD-10-CM | POA: Diagnosis not present

## 2023-03-02 DIAGNOSIS — E785 Hyperlipidemia, unspecified: Secondary | ICD-10-CM | POA: Diagnosis present

## 2023-03-02 DIAGNOSIS — R0600 Dyspnea, unspecified: Secondary | ICD-10-CM | POA: Diagnosis not present

## 2023-03-02 DIAGNOSIS — G9341 Metabolic encephalopathy: Secondary | ICD-10-CM | POA: Diagnosis not present

## 2023-03-02 DIAGNOSIS — Z66 Do not resuscitate: Secondary | ICD-10-CM | POA: Diagnosis not present

## 2023-03-02 DIAGNOSIS — N179 Acute kidney failure, unspecified: Secondary | ICD-10-CM | POA: Diagnosis not present

## 2023-03-02 DIAGNOSIS — Z7189 Other specified counseling: Secondary | ICD-10-CM | POA: Diagnosis not present

## 2023-03-02 DIAGNOSIS — I350 Nonrheumatic aortic (valve) stenosis: Secondary | ICD-10-CM | POA: Diagnosis not present

## 2023-03-02 DIAGNOSIS — M7989 Other specified soft tissue disorders: Secondary | ICD-10-CM

## 2023-03-02 DIAGNOSIS — M159 Polyosteoarthritis, unspecified: Secondary | ICD-10-CM | POA: Diagnosis present

## 2023-03-02 DIAGNOSIS — J9601 Acute respiratory failure with hypoxia: Secondary | ICD-10-CM | POA: Insufficient documentation

## 2023-03-02 DIAGNOSIS — I5031 Acute diastolic (congestive) heart failure: Secondary | ICD-10-CM | POA: Diagnosis not present

## 2023-03-02 DIAGNOSIS — R000 Tachycardia, unspecified: Secondary | ICD-10-CM | POA: Diagnosis present

## 2023-03-02 DIAGNOSIS — J90 Pleural effusion, not elsewhere classified: Secondary | ICD-10-CM | POA: Diagnosis not present

## 2023-03-02 DIAGNOSIS — I517 Cardiomegaly: Secondary | ICD-10-CM | POA: Diagnosis not present

## 2023-03-02 DIAGNOSIS — F0390 Unspecified dementia without behavioral disturbance: Secondary | ICD-10-CM | POA: Diagnosis not present

## 2023-03-02 DIAGNOSIS — I5043 Acute on chronic combined systolic (congestive) and diastolic (congestive) heart failure: Secondary | ICD-10-CM | POA: Diagnosis not present

## 2023-03-02 DIAGNOSIS — I959 Hypotension, unspecified: Secondary | ICD-10-CM | POA: Diagnosis not present

## 2023-03-02 DIAGNOSIS — F05 Delirium due to known physiological condition: Secondary | ICD-10-CM | POA: Diagnosis not present

## 2023-03-02 DIAGNOSIS — I77819 Aortic ectasia, unspecified site: Secondary | ICD-10-CM | POA: Diagnosis not present

## 2023-03-02 DIAGNOSIS — M810 Age-related osteoporosis without current pathological fracture: Secondary | ICD-10-CM | POA: Diagnosis present

## 2023-03-02 DIAGNOSIS — I509 Heart failure, unspecified: Secondary | ICD-10-CM | POA: Diagnosis not present

## 2023-03-02 DIAGNOSIS — Z808 Family history of malignant neoplasm of other organs or systems: Secondary | ICD-10-CM

## 2023-03-02 DIAGNOSIS — Z6824 Body mass index (BMI) 24.0-24.9, adult: Secondary | ICD-10-CM

## 2023-03-02 DIAGNOSIS — H9190 Unspecified hearing loss, unspecified ear: Secondary | ICD-10-CM | POA: Diagnosis present

## 2023-03-02 DIAGNOSIS — I214 Non-ST elevation (NSTEMI) myocardial infarction: Secondary | ICD-10-CM | POA: Insufficient documentation

## 2023-03-02 DIAGNOSIS — R609 Edema, unspecified: Secondary | ICD-10-CM | POA: Diagnosis not present

## 2023-03-02 DIAGNOSIS — R0902 Hypoxemia: Secondary | ICD-10-CM | POA: Diagnosis not present

## 2023-03-02 DIAGNOSIS — Z79890 Hormone replacement therapy (postmenopausal): Secondary | ICD-10-CM | POA: Diagnosis not present

## 2023-03-02 DIAGNOSIS — F32A Depression, unspecified: Secondary | ICD-10-CM | POA: Diagnosis present

## 2023-03-02 DIAGNOSIS — Z89421 Acquired absence of other right toe(s): Secondary | ICD-10-CM

## 2023-03-02 DIAGNOSIS — R0989 Other specified symptoms and signs involving the circulatory and respiratory systems: Secondary | ICD-10-CM | POA: Diagnosis not present

## 2023-03-02 DIAGNOSIS — Z79899 Other long term (current) drug therapy: Secondary | ICD-10-CM

## 2023-03-02 LAB — BLOOD GAS, VENOUS
Acid-base deficit: 12 mmol/L — ABNORMAL HIGH (ref 0.0–2.0)
Bicarbonate: 14 mmol/L — ABNORMAL LOW (ref 20.0–28.0)
O2 Saturation: 79.4 %
Patient temperature: 37
pCO2, Ven: 32 mm[Hg] — ABNORMAL LOW (ref 44–60)
pH, Ven: 7.25 (ref 7.25–7.43)
pO2, Ven: 47 mm[Hg] — ABNORMAL HIGH (ref 32–45)

## 2023-03-02 LAB — RESP PANEL BY RT-PCR (RSV, FLU A&B, COVID)  RVPGX2
Influenza A by PCR: NEGATIVE
Influenza B by PCR: NEGATIVE
Resp Syncytial Virus by PCR: NEGATIVE
SARS Coronavirus 2 by RT PCR: NEGATIVE

## 2023-03-02 LAB — COMPREHENSIVE METABOLIC PANEL
ALT: 23 U/L (ref 0–44)
AST: 32 U/L (ref 15–41)
Albumin: 3.4 g/dL — ABNORMAL LOW (ref 3.5–5.0)
Alkaline Phosphatase: 71 U/L (ref 38–126)
Anion gap: 12 (ref 5–15)
BUN: 30 mg/dL — ABNORMAL HIGH (ref 8–23)
CO2: 20 mmol/L — ABNORMAL LOW (ref 22–32)
Calcium: 8.5 mg/dL — ABNORMAL LOW (ref 8.9–10.3)
Chloride: 106 mmol/L (ref 98–111)
Creatinine, Ser: 0.91 mg/dL (ref 0.44–1.00)
GFR, Estimated: >60 mL/min (ref >60)
Glucose, Bld: 127 mg/dL — ABNORMAL HIGH (ref 70–99)
Potassium: 3.7 mmol/L (ref 3.5–5.1)
Sodium: 138 mmol/L (ref 135–145)
Total Bilirubin: 0.5 mg/dL (ref 0.0–1.2)
Total Protein: 6.6 g/dL (ref 6.5–8.1)

## 2023-03-02 LAB — TROPONIN I (HIGH SENSITIVITY): Troponin I (High Sensitivity): 34 ng/L — ABNORMAL HIGH (ref <18)

## 2023-03-02 LAB — CBC
HCT: 36.7 % (ref 36.0–46.0)
Hemoglobin: 11.4 g/dL — ABNORMAL LOW (ref 12.0–15.0)
MCH: 30.7 pg (ref 26.0–34.0)
MCHC: 31.1 g/dL (ref 30.0–36.0)
MCV: 98.9 fL (ref 80.0–100.0)
Platelets: 371 10*3/uL (ref 150–400)
RBC: 3.71 MIL/uL — ABNORMAL LOW (ref 3.87–5.11)
RDW: 15.7 % — ABNORMAL HIGH (ref 11.5–15.5)
WBC: 11.7 10*3/uL — ABNORMAL HIGH (ref 4.0–10.5)
nRBC: 0 % (ref 0.0–0.2)

## 2023-03-02 LAB — D-DIMER, QUANTITATIVE: D-Dimer, Quant: 1.84 ug{FEU}/mL — ABNORMAL HIGH (ref 0.00–0.50)

## 2023-03-02 LAB — BRAIN NATRIURETIC PEPTIDE: B Natriuretic Peptide: 1302.9 pg/mL — ABNORMAL HIGH (ref 0.0–100.0)

## 2023-03-02 MED ORDER — ZINC OXIDE 11.3 % EX CREA
TOPICAL_CREAM | Freq: Two times a day (BID) | CUTANEOUS | Status: DC
  Filled 2023-03-02: qty 56

## 2023-03-02 MED ORDER — DULOXETINE HCL 30 MG PO CPEP: 30.0000 mg | ORAL_CAPSULE | Freq: Every day | ORAL | Status: DC

## 2023-03-02 MED ORDER — OYSTER SHELL CALCIUM/D3 500-5 MG-MCG PO TABS
1.0000 | ORAL_TABLET | Freq: Two times a day (BID) | ORAL | Status: DC
Start: 2023-03-02 — End: 2023-03-04
  Filled 2023-03-02: qty 1

## 2023-03-02 MED ORDER — INSULIN ASPART 100 UNIT/ML IJ SOLN
0.0000 [IU] | INTRAMUSCULAR | Status: DC
  Administered 2023-03-03 – 2023-03-04 (×6): 1 [IU] via SUBCUTANEOUS
  Filled 2023-03-02: qty 0.06

## 2023-03-02 MED ORDER — LEVOTHYROXINE SODIUM 100 MCG PO TABS: 100.0000 ug | ORAL_TABLET | Freq: Every day | ORAL | Status: DC

## 2023-03-02 MED ORDER — SENNOSIDES-DOCUSATE SODIUM 8.6-50 MG PO TABS
1.0000 | ORAL_TABLET | Freq: Every day | ORAL | Status: DC
Start: 2023-03-03 — End: 2023-03-04

## 2023-03-02 MED ORDER — NITROGLYCERIN IN D5W 200-5 MCG/ML-% IV SOLN
0.0000 ug/min | INTRAVENOUS | Status: DC
  Administered 2023-03-02: 5 ug/min via INTRAVENOUS
  Filled 2023-03-02: qty 250

## 2023-03-02 MED ORDER — LIDOCAINE 4 % EX PTCH
1.0000 | MEDICATED_PATCH | Freq: Every day | CUTANEOUS | Status: DC | PRN
Start: 2023-03-02 — End: 2023-03-02

## 2023-03-02 MED ORDER — FUROSEMIDE 10 MG/ML IJ SOLN
80.0000 mg | Freq: Once | INTRAMUSCULAR | Status: AC
  Administered 2023-03-02: 80 mg via INTRAVENOUS
  Filled 2023-03-02: qty 8

## 2023-03-02 MED ORDER — ENOXAPARIN SODIUM 40 MG/0.4ML IJ SOSY: 40.0000 mg | PREFILLED_SYRINGE | INTRAMUSCULAR | Status: DC

## 2023-03-02 MED ORDER — SENNA-DOCUSATE SODIUM 8.6-50 MG PO TABS: 1.0000 | ORAL_TABLET | Freq: Every day | ORAL | Status: DC

## 2023-03-02 MED ORDER — LIDOCAINE 5 % EX PTCH: 1.0000 | MEDICATED_PATCH | Freq: Every day | CUTANEOUS | Status: DC | PRN

## 2023-03-02 MED ORDER — ASPIRIN 81 MG PO CHEW: 81.0000 mg | CHEWABLE_TABLET | Freq: Every day | ORAL | Status: DC

## 2023-03-02 MED ORDER — ADULT MULTIVITAMIN W/MINERALS CH: 1.0000 | ORAL_TABLET | Freq: Every day | ORAL | Status: DC

## 2023-03-02 MED ORDER — POTASSIUM CHLORIDE ER 10 MEQ PO TBCR
10.0000 meq | EXTENDED_RELEASE_TABLET | ORAL | Status: DC
  Filled 2023-03-02: qty 1

## 2023-03-02 MED ORDER — ZINC OXIDE 10 % EX OINT: 1.0000 | TOPICAL_OINTMENT | CUTANEOUS | Status: DC

## 2023-03-02 MED ORDER — ACETYLCYSTEINE 20 % IN SOLN
4.0000 mL | Freq: Three times a day (TID) | RESPIRATORY_TRACT | Status: DC
  Administered 2023-03-03 – 2023-03-04 (×5): 4 mL via RESPIRATORY_TRACT
  Filled 2023-03-02 (×6): qty 4

## 2023-03-02 MED ORDER — ACETYLCYSTEINE 10% NICU INHALATION SOLUTION: 4.0000 mL | Freq: Three times a day (TID) | RESPIRATORY_TRACT | Status: DC

## 2023-03-02 MED ORDER — DEXTROSE 10 % IV SOLN: INTRAVENOUS | Status: DC

## 2023-03-02 MED ORDER — FUROSEMIDE 10 MG/ML IJ SOLN: 40.0000 mg | Freq: Once | INTRAMUSCULAR | Status: DC

## 2023-03-02 MED ORDER — IPRATROPIUM-ALBUTEROL 0.5-2.5 (3) MG/3ML IN SOLN
3.0000 mL | RESPIRATORY_TRACT | Status: AC
  Administered 2023-03-02 – 2023-03-03 (×6): 3 mL via RESPIRATORY_TRACT
  Filled 2023-03-02 (×4): qty 3

## 2023-03-02 MED ORDER — LORAZEPAM 2 MG/ML IJ SOLN
0.5000 mg | Freq: Four times a day (QID) | INTRAMUSCULAR | Status: DC | PRN
  Administered 2023-03-02 – 2023-03-03 (×5): 0.5 mg via INTRAVENOUS
  Filled 2023-03-02 (×6): qty 1

## 2023-03-02 MED ORDER — SODIUM CHLORIDE 0.9% FLUSH
3.0000 mL | Freq: Two times a day (BID) | INTRAVENOUS | Status: DC
  Administered 2023-03-03 – 2023-03-04 (×3): 3 mL via INTRAVENOUS

## 2023-03-02 MED ORDER — ACETAMINOPHEN 650 MG RE SUPP: 650.0000 mg | Freq: Four times a day (QID) | RECTAL | Status: DC | PRN

## 2023-03-02 MED ORDER — DICLOFENAC SODIUM 1 % EX GEL
1.0000 | Freq: Four times a day (QID) | CUTANEOUS | Status: DC | PRN
  Filled 2023-03-02 (×2): qty 100

## 2023-03-02 MED ORDER — POLYETHYLENE GLYCOL 3350 17 G PO PACK
17.0000 g | PACK | Freq: Every day | ORAL | Status: DC | PRN
Start: 2023-03-02 — End: 2023-03-05

## 2023-03-02 MED ORDER — PANTOPRAZOLE SODIUM 40 MG IV SOLR
40.0000 mg | INTRAVENOUS | Status: DC
Start: 2023-03-02 — End: 2023-03-04
  Administered 2023-03-02 – 2023-03-03 (×2): 40 mg via INTRAVENOUS
  Filled 2023-03-02 (×2): qty 10

## 2023-03-02 MED ORDER — FUROSEMIDE 10 MG/ML IJ SOLN
40.0000 mg | Freq: Every day | INTRAMUSCULAR | Status: DC
  Administered 2023-03-03: 40 mg via INTRAVENOUS
  Filled 2023-03-02: qty 4

## 2023-03-02 MED ORDER — METOPROLOL TARTRATE 5 MG/5ML IV SOLN
5.0000 mg | Freq: Once | INTRAVENOUS | Status: AC
  Administered 2023-03-02: 5 mg via INTRAVENOUS
  Filled 2023-03-02: qty 5

## 2023-03-02 MED ORDER — HYDRALAZINE HCL 20 MG/ML IJ SOLN: 10.0000 mg | INTRAMUSCULAR | Status: DC | PRN

## 2023-03-02 MED ORDER — SODIUM CHLORIDE 0.9 % IV SOLN
500.0000 mg | INTRAVENOUS | Status: DC
  Administered 2023-03-02 – 2023-03-03 (×2): 500 mg via INTRAVENOUS
  Filled 2023-03-02 (×3): qty 5

## 2023-03-02 MED ORDER — CALCIUM CARBONATE-VITAMIN D 600-200 MG-UNIT PO TABS: 1.0000 | ORAL_TABLET | Freq: Two times a day (BID) | ORAL | Status: DC

## 2023-03-02 MED ORDER — OMEGA-3-ACID ETHYL ESTERS 1 G PO CAPS
1.0000 g | ORAL_CAPSULE | Freq: Every day | ORAL | Status: DC
  Filled 2023-03-02 (×2): qty 1

## 2023-03-02 MED ORDER — HYDROCORTISONE (PERIANAL) 2.5 % EX CREA: 1.0000 | TOPICAL_CREAM | CUTANEOUS | Status: DC | PRN

## 2023-03-02 MED ORDER — ACETAMINOPHEN 325 MG PO TABS
650.0000 mg | ORAL_TABLET | Freq: Four times a day (QID) | ORAL | Status: DC | PRN
  Administered 2023-03-03: 650 mg via ORAL
  Filled 2023-03-02: qty 2

## 2023-03-02 MED ORDER — SODIUM CHLORIDE 0.9 % IV SOLN
1.0000 g | INTRAVENOUS | Status: DC
  Administered 2023-03-02 – 2023-03-03 (×2): 1 g via INTRAVENOUS
  Filled 2023-03-02 (×3): qty 10

## 2023-03-02 NOTE — Assessment & Plan Note (Addendum)
Patient has documented history of bilateral chronic lower extremity edema.  The patient has developed worsening hypoxemic respiratory failure over the course of the day today.  Now on 100% high flow nasal cannula barely maintaining 91% SpO2.  Chest x-ray showing bilateral pleural effusions as well as interstitial opacification.  Principal concern is fluid overload given the elevated BNP.  Patient has received 80 mg of Lasix, will monitor intake output and daily weights.  Will continue with 40 mg Lasix tomorrow.  However given elevated white count, we cannot rule out pneumonia and therefore patient has also been started on treatment with ceftriaxone azithromycin.  Blood cultures are pending.  D-dimer elevation in this context is very nonspecific, at this time will not pursue lower extremity Dopplers.  If persistent lower extremity swelling, can consider Doppler at that time.  Patient is not felt to have reactive airway disease exacerbation given her exam, some wheezes that were heard in the right upper lung field are felt to be upper airway breath sounds.  However will order empiric DuoNeb therapy, turn and position as well as Mucomyst therapy.  GI prophylaxis with pantoprazole will be ordered given the patient's precarious clinical status.  Last echo on file is from March 2023 with finding of left ventricular hypertrophy as well as diastolic dysfunction.  Also finding of mitral valve regurgitation.  As well as severe calcification of the aortic valve and severe aortic valve stenosis.  There is no evidence of patient having had repair of the aortic valve done.  Patient current presentation is consistent with above.  I do not think checking an echo tonight would meaningfully change her clinical outcomes.  Therefore we will defer echo at this time

## 2023-03-02 NOTE — ED Triage Notes (Signed)
[facility] Facility reports that pt was having difficulty breathing on exertion which is abnormal for her. Pt was saturating in the 80's on while on 2L nasal canula. Pt 93 on 4L nasal canula. Pt is alert and able to answer questions. Pt has difficulty hearing and does not have her hearing aides.

## 2023-03-02 NOTE — ED Provider Notes (Signed)
[name] [facility] AT [facility] : [facility] Provider Note   CSN: [id number] Arrival date & time: 03/02/23  1255     History  Chief Complaint  Patient presents with   Shortness of Breath    Pt saturating in the 80's on 2L nasal canula. Pt normally on room air.    Krista Bowman is a 89 y.o. female.   Shortness of Breath    Patient has a history of hearing loss cerebral atrophy, memory loss ventral hernia who presents to the ED for evaluation of shortness of breath.  Patient resides in a nursing facility.  She woke up this morning and apparently was in her usual state of health.  She ate breakfast.  Staff at the facility noted that when she was walking away from the dining room she appeared short of breath.  They measured her oxygen saturation and it was in the 80s.  Patient normally does not have any breathing difficulties.  She is not chronically on oxygen. Patient states she is not really sure why she is here.  She does remember feeling short of breath earlier.  She denies shortness of breath now but she is on supplemental oxygen.  She denies any chest pain.  Home Medications Prior to Admission medications   Medication Sig Start Date End Date Taking? Authorizing Provider  acetaminophen (TYLENOL) 500 MG tablet Take 2 tablets (1,000 mg total) by mouth every 6 (six) hours as needed. Patient taking differently: Take 1,000 mg by mouth every 6 (six) hours as needed for moderate pain (pain score 4-6) or mild pain (pain score 1-3). 05/11/19  Yes Barnetta Chapel, PA-C  alendronate (FOSAMAX) 70 MG tablet Take 70 mg by mouth once a week. Every Friday   Yes [provider]  aspirin 81 MG chewable tablet Chew 81 mg by mouth daily.   Yes [provider]  Calcium Carbonate-Vitamin D 600-200 MG-UNIT TABS Take 1 tablet by mouth 2 (two) times daily.   Yes [provider]  diclofenac Sodium (VOLTAREN) 1 % GEL Apply 1 Application topically every 6 (six) hours as  needed (right knee pain).   Yes [provider]  DULoxetine (CYMBALTA) 30 MG capsule Take 30 mg by mouth daily.   Yes [provider]  ferrous sulfate 325 (65 FE) MG EC tablet Take 325 mg by mouth every other day.   Yes [provider]  furosemide (LASIX) 20 MG tablet Take 10 mg by mouth every Monday, Wednesday, and Friday.   Yes [provider]  HYDROcodone-acetaminophen (NORCO/VICODIN) 5-325 MG tablet Take 0.5 tablets by mouth 2 (two) times daily. Two listings per [employer] at [facility] Patient taking differently: Take 0.5 tablets by mouth 2 (two) times daily. 06/18/22  Yes Medina-Vargas, Monina C, NP  hydrocortisone (PROCTO-MED HC) 2.5 % rectal cream Place 1 Application rectally as needed for hemorrhoids or anal itching. 04/07/22  Yes Ngetich, Dinah C, NP  levothyroxine (SYNTHROID) 100 MCG tablet Take 100 mcg by mouth daily. 04/19/21  Yes [provider]  lidocaine (ASPERCREME LIDOCAINE) 4 % Place 1 patch onto the skin daily as needed (right shoulder pain).   Yes [provider]  Multiple Vitamin (MULTIVITAMIN WITH MINERALS) TABS Take 1 tablet by mouth daily.   Yes [provider]  Omega-3 Fatty Acids (FISH OIL) 1200 MG CAPS Take 1 capsule by mouth daily.   Yes [provider]  potassium chloride (KLOR-CON) 10 MEQ tablet Take 10 mEq by mouth every Monday, Wednesday,  and Friday.   Yes [provider]  sennosides-docusate sodium (SENOKOT-S) 8.6-50 MG tablet Take 1 tablet by mouth daily.   Yes [provider]  Sodium Phosphates (FLEET ENEMA RE) Place 1 Dose rectally as needed (constipation).   Yes [provider]  Zinc Oxide 10 % OINT Apply 1 Application topically as directed. Every shift to buttocks for redness   Yes [provider]      Allergies    Dust mite extract, Mushroom extract complex (do not select), and Penicillins    Review of Systems   Review of Systems   Respiratory:  Positive for shortness of breath.     Physical Exam Updated Vital Signs BP 118/68   Pulse 100   Temp 98.4 F (36.9 C) (Oral)   Resp (!) 21   SpO2 94%  Physical Exam Vitals and nursing note reviewed.  Constitutional:      Appearance: She is ill-appearing.     Comments: Elderly, frail  HENT:     Head: Normocephalic and atraumatic.     Right Ear: External ear normal.     Left Ear: External ear normal.  Eyes:     General: No scleral icterus.       Right eye: No discharge.        Left eye: No discharge.     Conjunctiva/sclera: Conjunctivae normal.  Neck:     Trachea: No tracheal deviation.  Cardiovascular:     Rate and Rhythm: Normal rate and regular rhythm.  Pulmonary:     Effort: Pulmonary effort is normal. Tachypnea present. No respiratory distress.     Breath sounds: Normal breath sounds. No stridor. No wheezing or rales.  Abdominal:     General: Bowel sounds are normal. There is no distension.     Palpations: Abdomen is soft.     Tenderness: There is no abdominal tenderness. There is no guarding or rebound.  Musculoskeletal:        General: No tenderness or deformity.     Cervical back: Neck supple.     Right lower leg: Edema present.     Left lower leg: Edema present.  Skin:    General: Skin is warm and dry.     Findings: No rash.  Neurological:     General: No focal deficit present.     Mental Status: She is alert.     Cranial Nerves: No cranial nerve deficit, dysarthria or facial asymmetry.     Sensory: No sensory deficit.     Motor: No abnormal muscle tone or seizure activity.     Coordination: Coordination normal.  Psychiatric:        Mood and Affect: Mood normal.     ED Results / Procedures / Treatments   Labs (all labs ordered are listed, but only abnormal results are displayed) Labs Reviewed  COMPREHENSIVE METABOLIC PANEL - Abnormal; Notable for the following components:      Result Value   CO2 20 (*)    Glucose, Bld 127 (*)     BUN 30 (*)    Calcium 8.5 (*)    Albumin 3.4 (*)    All other components within normal limits  CBC - Abnormal; Notable for the following components:   WBC 11.7 (*)    RBC 3.71 (*)    Hemoglobin 11.4 (*)    RDW 15.7 (*)    All other components within normal limits  D-DIMER, QUANTITATIVE - Abnormal; Notable for the following components:   D-Dimer, Quant  1.84 (*)    All other components within normal limits  BLOOD GAS, VENOUS - Abnormal; Notable for the following components:   pCO2, Ven 32 (*)    pO2, Ven 47 (*)    Bicarbonate 14.0 (*)    Acid-base deficit 12.0 (*)    All other components within normal limits  TROPONIN I (HIGH SENSITIVITY) - Abnormal; Notable for the following components:   Troponin I (High Sensitivity) 34 (*)    All other components within normal limits  RESP PANEL BY RT-PCR (RSV, FLU A&B, COVID)  RVPGX2  BRAIN NATRIURETIC PEPTIDE  I-STAT CHEM 8, ED  TROPONIN I (HIGH SENSITIVITY)    EKG EKG Interpretation Date/Time:  Monday March 02 2023 13:49:49 EST Ventricular Rate:  132 PR Interval:    QRS Duration:  92 QT Interval:  346 QTC Calculation: 513 R Axis:   99  Text Interpretation: Junctional tachycardia Right axis deviation ST depression, probably rate related Prolonged QT interval Since last tracing rate faster Confirmed by Linwood Dibbles [phone]) on 03/02/2023 1:53:22 PM  Radiology No results found.  Procedures .Critical Care  Performed by: Linwood Dibbles, MD Authorized by: Linwood Dibbles, MD   Critical care provider statement:    Critical care time (minutes):  45   Critical care was time spent personally by me on the following activities:  Development of treatment plan with patient or surrogate, discussions with consultants, evaluation of patient's response to treatment, examination of patient, ordering and review of laboratory studies, ordering and review of radiographic studies, ordering and performing treatments and interventions, pulse oximetry, re-evaluation  of patient's condition and review of old charts     Medications Ordered in ED Medications  nitroGLYCERIN 50 mg in dextrose 5 % 250 mL (0.2 mg/mL) infusion (5 mcg/min Intravenous New Bag/Given 03/02/23 1414)  metoprolol tartrate (LOPRESSOR) injection 5 mg (5 mg Intravenous Given 03/02/23 1408)    ED Course/ Medical Decision Making/ A&P Clinical Course as of 03/02/23 1546  Mon Mar 02, 2023  1346 Patient feeling more short of breath now.  Appears to be more anxious.  Oxygen saturation decreased.  Will order BiPAP. [JK]  1354 Preliminary review of chest x-ray suggest vascular congestion pleural effusion [JK]  1356 Suspect acute CHF exacerbation.  Will initiate nitroglycerin infusion and bipap [JK]  1405 Reviewed case with patient's son.  Explained the severe nature of her illness.  He confirms that she is DNR.  He also think she is DNI. [JK]  1409 Patient is refusing to keep the BiPAP on.  Abs at the bedside and explained to her that we are very concerned about her breathing.  She may die if we take the BiPAP off as her saturations had been down into the 70s.  Patient indicated that she does not care if she dies.  She does not want the mask on.  Patient is alert she does not appear delirious.  Will remove the mass per her wishes.  Will try high flow nasal cannula oxygen [JK]  1458 Venous blood gas does not show signs of severe acidosis.  COVID flu RSV negative. [JK]  1458 Troponin elevated.  D-dimer elevated at 1.84 [JK]  1519 Oxygen saturation is improved. [JK]  1546 Case discussed with Dr Maryjean Ka [JK]    Clinical Course User Index [JK] Linwood Dibbles, MD  Medical Decision Making Problems Addressed: Acute on chronic congestive heart failure, unspecified heart failure type [facility]): acute illness or injury that poses a threat to life or bodily functions Respiratory distress: acute illness or injury that poses a threat to life or bodily functions  Amount and/or Complexity  of Data Reviewed Labs: ordered. Decision-making details documented in ED Course. Radiology: ordered and independent interpretation performed.  Risk Prescription drug management. Decision regarding hospitalization.   Patient presented to the ED with complaints of severe shortness of breath.  History is somewhat difficult as the patient does have some memory issues.  She does not want to be here in the hospital and has been resistant to move any of the interventions.  Patient noted to have acute dyspnea and oxygen requirement in the ED.  She had notable edema of her lower extremities.  Patient was notably hypertensive as well on arrival.  Patient's laboratory test showed slight elevation white blood cell count.  Troponin is also increased.  BMP is currently pending.  Her COVID flu RSV is negative.  Venous blood gas does not show signs of severe acidosis.  ABG was attempted but the patient refused.  Patient was started on BiPAP but she did not tolerate that and asked for it to be removed.  Explained to the patient that we were concerned about her breathing and that she could die if she continued to worsen.  Patient adamantly indicated she did not care so the mask was removed and she was started on high flow nasal cannula oxygen.  Oxygen saturation has seemed to stabilize.  Chest x-ray suggestive of pulmonary edema with vascular congestion.  She does have evidence of bilateral pleural effusions.  Formal chest x-ray reading is pending.  I have started on Lasix as well as nitroglycerin infusion.  Patient was also given a metoprolol for her tachycardia initially.  D dimer noted to be elevated but at this time do not feel ct angio necessary.  Suspect  Vital signs are improving and her respiratory distress has decreased.  I discussed findings with the patient as well as her son.  DNR DNR is confirmed.  Will consult the medical service for admission       Final Clinical Impression(s) / ED  Diagnoses Final diagnoses:  Acute on chronic congestive heart failure, unspecified heart failure type [facility])  Respiratory distress    Rx / DC Orders ED Discharge Orders     None         Linwood Dibbles, MD 03/02/23 [phone]

## 2023-03-02 NOTE — Assessment & Plan Note (Addendum)
Acute on chronic in the setting of underlying dementia.  This is due to hypoxia as well as need for Ativan to prevent patient from having anxiety and removing her oxygen supplementation.  Nonfocal.  Monitor clinically  At this time patient is not a candidate for oral diet.  Therefore low-dose D10 has been ordered with glucose monitoring with very sensitive insulin scale.  Once patient is awake and eating, we can DC insulin scale as well as IV fluids.

## 2023-03-02 NOTE — H&P (Signed)
History and Physical    Patient: Krista Bowman NWG:[id number] DOB: [birth year] DOA: 03/02/2023 DOS: the patient was seen and examined on 03/02/2023 PCP: Venita Sheffield, MD  Patient coming from: SNF  Chief Complaint:  Chief Complaint  Patient presents with   Shortness of Breath    Pt saturating in the 80's on 2L nasal canula. Pt normally on room air.   HPI: Krista Bowman is a 89 y.o. female with medical history significant of dementia, chronic medical memory issues.  As well as other medical problems listed below.  History is obtained from secondary sources.  Patient is a resident of a nursing home facility.  Earlier today patient was noted to have shortness of breath while walking around in the morning.  Around breakfast time.  She was found to be tachycardic and hypoxic to 88% on room air.  She typically does not use supplementary oxygen at home.  There is no report of patient having had any fever chest pain dizziness cough runny nose abdominal pain etc.  She has chronic lower extremity swelling.  Patient was sent to [facility] ER for further evaluation.  Here in the ER, patient has required increasing amounts of respiratory support/oxygen supplementation.  Patient was initially trialed on BiPAP however did not tolerate it in terms of wanting to remove it over and over again.  Patient has since then been stabilized on high flow nasal cannula, getting 100% humidified heated oxygen supplementation by nose.  In spite of this, patient was feeling short of breath and tended to remove it.  Therefore patient did receive Ativan in the ER.  And is now restful.  Patient was found to be markedly hypertensive when initially encountered in the ER.  Patient is s/p nitroglycerin infusion.  Patient blood pressure is now stabilized.  History is not obtained from the patient.  As above.  Medical admission is sought    Review of Systems: As mentioned in the history of present illness. All other systems  reviewed and are negative. Past Medical History:  Diagnosis Date   Cerebral atrophy (HCC) 01/21/2013   Hyperlipidemia    Hypothyroid    Osteoarthrosis, unspecified whether generalized or localized, unspecified site    Osteoporosis, unspecified    Other and unspecified hyperlipidemia    Other generalized ischemic cerebrovascular disease 01/21/2013   small vessel disease   Syncope and collapse    Umbilical hernia without mention of obstruction or gangrene    Unspecified hearing loss    Unspecified hypothyroidism    Unspecified vitamin D deficiency    Ventral hernia, unspecified, without mention of obstruction or gangrene    Past Surgical History:  Procedure Laterality Date   CATARACT EXTRACTION W/ INTRAOCULAR LENS  IMPLANT, BILATERAL  2012   Dr. Hazle Quant   HERNIA REPAIR  01/2008   laparoscopic Dr. Ovidio Kin   INGUINAL HERNIA REPAIR N/A 05/10/2019   Procedure: LAPAROSCOPIC FEMORAL HERNIA REPAIR WITH MESH;  Surgeon: Axel Filler, MD;  Location: [facility] OR;  Service: General;  Laterality: N/A;   KNEE SURGERY  2008   Dr. Berneice Heinrich  left   TOE AMPUTATION  1986   little toe right foot  Dr. Cliffton Asters; [place]   Social History:  reports that she has never smoked. She has never used smokeless tobacco. She reports that she does not drink alcohol and does not use drugs.  Allergies  Allergen Reactions   Dust Mite Extract Other (See Comments)    Reaction not listed on MAR    Mushroom  Extract Complex (Do Not Select) Other (See Comments)    Reaction not listed on MAR    Penicillins Diarrhea    Family History  Problem Relation Age of Onset   Melanoma Sister    Cancer Sister     Prior to Admission medications   Medication Sig Start Date End Date Taking? Authorizing Provider  acetaminophen (TYLENOL) 500 MG tablet Take 2 tablets (1,000 mg total) by mouth every 6 (six) hours as needed. Patient taking differently: Take 1,000 mg by mouth every 6 (six) hours as needed for moderate pain (pain  score 4-6) or mild pain (pain score 1-3). 05/11/19  Yes Barnetta Chapel, PA-C  alendronate (FOSAMAX) 70 MG tablet Take 70 mg by mouth once a week. Every Friday   Yes [provider]  aspirin 81 MG chewable tablet Chew 81 mg by mouth daily.   Yes [provider]  Calcium Carbonate-Vitamin D 600-200 MG-UNIT TABS Take 1 tablet by mouth 2 (two) times daily.   Yes [provider]  diclofenac Sodium (VOLTAREN) 1 % GEL Apply 1 Application topically every 6 (six) hours as needed (right knee pain).   Yes [provider]  DULoxetine (CYMBALTA) 30 MG capsule Take 30 mg by mouth daily.   Yes [provider]  ferrous sulfate 325 (65 FE) MG EC tablet Take 325 mg by mouth every other day.   Yes [provider]  furosemide (LASIX) 20 MG tablet Take 10 mg by mouth every Monday, Wednesday, and Friday.   Yes [provider]  HYDROcodone-acetaminophen (NORCO/VICODIN) 5-325 MG tablet Take 0.5 tablets by mouth 2 (two) times daily. Two listings per [employer] at [facility] Patient taking differently: Take 0.5 tablets by mouth 2 (two) times daily. 06/18/22  Yes Medina-Vargas, Monina C, NP  hydrocortisone (PROCTO-MED HC) 2.5 % rectal cream Place 1 Application rectally as needed for hemorrhoids or anal itching. 04/07/22  Yes Ngetich, Dinah C, NP  levothyroxine (SYNTHROID) 100 MCG tablet Take 100 mcg by mouth daily. 04/19/21  Yes [provider]  lidocaine (ASPERCREME LIDOCAINE) 4 % Place 1 patch onto the skin daily as needed (right shoulder pain).   Yes [provider]  Multiple Vitamin (MULTIVITAMIN WITH MINERALS) TABS Take 1 tablet by mouth daily.   Yes [provider]  Omega-3 Fatty Acids (FISH OIL) 1200 MG CAPS Take 1 capsule by mouth daily.   Yes [provider]  potassium chloride (KLOR-CON) 10 MEQ tablet Take 10 mEq by mouth every Monday, Wednesday, and Friday.   Yes [provider]   sennosides-docusate sodium (SENOKOT-S) 8.6-50 MG tablet Take 1 tablet by mouth daily.   Yes [provider]  Sodium Phosphates (FLEET ENEMA RE) Place 1 Dose rectally as needed (constipation).   Yes [provider]  Zinc Oxide 10 % OINT Apply 1 Application topically as directed. Every shift to buttocks for redness   Yes [provider]    Physical Exam: Vitals:   03/02/23 1405 03/02/23 1510 03/02/23 1730 03/02/23 1739  BP:  118/68 (!) 103/59   Pulse: (!) 138 100 (!) 103   Resp: (!) 32 (!) 21 (!) 24   Temp:    98.4 F (36.9 C)  TempSrc:    Axillary  SpO2: 93% 94% 91%    General: Patient turned to 1 side, ill-appearing/slight emaciation.  Keeps eyes closed, not engaging in conversation or following directions.  Appears to be sleeping.  Easily bothered, cries as if in discomfort. Respiratory exam: Bilateral  air entry is okay left-sided air entry without any crackles or wheezes.  Some occasional wheezing on the right side upper airway lung field.  Exam is limited as patient cannot be set up.  And is very discomforted by attempts to maneuver her. Cardiovascular exam S1-S2 normal, murmur is heard.  Systolic. Abdomen soft nontender Extremities edema pitting up to bilateral knees.  Distal function seem to be intact as and patient resists movements or attempts to manipulate her with all 4 extremities. Data Reviewed:  Labs on Admission:  Results for orders placed or performed during the hospital encounter of 03/02/23 (from the past 24 hours)  Comprehensive metabolic panel     Status: Abnormal   Collection Time: 03/02/23 12:50 PM  Result Value Ref Range   Sodium 138 135 - 145 mmol/L   Potassium 3.7 3.5 - 5.1 mmol/L   Chloride 106 98 - 111 mmol/L   CO2 20 (L) 22 - 32 mmol/L   Glucose, Bld 127 (H) 70 - 99 mg/dL   BUN 30 (H) 8 - 23 mg/dL   Creatinine, Ser 4.54 0.44 - 1.00 mg/dL   Calcium 8.5 (L) 8.9 - 10.3 mg/dL   Total Protein 6.6 6.5 - 8.1 g/dL   Albumin 3.4 (L)  3.5 - 5.0 g/dL   AST 32 15 - 41 U/L   ALT 23 0 - 44 U/L   Alkaline Phosphatase 71 38 - 126 U/L   Total Bilirubin 0.5 0.0 - 1.2 mg/dL   GFR, Estimated >09 >81 mL/min   Anion gap 12 5 - 15  CBC     Status: Abnormal   Collection Time: 03/02/23 12:50 PM  Result Value Ref Range   WBC 11.7 (H) 4.0 - 10.5 K/uL   RBC 3.71 (L) 3.87 - 5.11 MIL/uL   Hemoglobin 11.4 (L) 12.0 - 15.0 g/dL   HCT 19.1 47.8 - 29.5 %   MCV 98.9 80.0 - 100.0 fL   MCH 30.7 26.0 - 34.0 pg   MCHC 31.1 30.0 - 36.0 g/dL   RDW 62.1 (H) 30.8 - 65.7 %   Platelets 371 150 - 400 K/uL   nRBC 0.0 0.0 - 0.2 %  D-dimer, quantitative     Status: Abnormal   Collection Time: 03/02/23 12:50 PM  Result Value Ref Range   D-Dimer, Quant 1.84 (H) 0.00 - 0.50 ug/mL-FEU  Brain natriuretic peptide     Status: Abnormal   Collection Time: 03/02/23 12:50 PM  Result Value Ref Range   B Natriuretic Peptide 1,302.9 (H) 0.0 - 100.0 pg/mL  Troponin I (High Sensitivity)     Status: Abnormal   Collection Time: 03/02/23 12:50 PM  Result Value Ref Range   Troponin I (High Sensitivity) 34 (H) <18 ng/L  Resp panel by RT-PCR (RSV, Flu A&B, Covid) Anterior Nasal Swab     Status: None   Collection Time: 03/02/23  1:55 PM   Specimen: Anterior Nasal Swab  Result Value Ref Range   SARS Coronavirus 2 by RT PCR NEGATIVE NEGATIVE   Influenza A by PCR NEGATIVE NEGATIVE   Influenza B by PCR NEGATIVE NEGATIVE   Resp Syncytial Virus by PCR NEGATIVE NEGATIVE  Blood gas, venous (at [facility] and [facility])     Status: Abnormal   Collection Time: 03/02/23  2:43 PM  Result Value Ref Range   pH, Ven 7.25 7.25 - 7.43   pCO2, Ven 32 (L) 44 - 60 mmHg   pO2, Ven 47 (H) 32 - 45 mmHg   Bicarbonate 14.0 (  L) 20.0 - 28.0 mmol/L   Acid-base deficit 12.0 (H) 0.0 - 2.0 mmol/L   O2 Saturation 79.4 %   Patient temperature 37.0    Basic Metabolic Panel: Recent Labs  Lab 03/02/23 1250  NA 138  K 3.7  CL 106  CO2 20*  GLUCOSE 127*  BUN 30*  CREATININE 0.91  CALCIUM 8.5*    Liver Function Tests: Recent Labs  Lab 03/02/23 1250  AST 32  ALT 23  ALKPHOS 71  BILITOT 0.5  PROT 6.6  ALBUMIN 3.4*   No results for input(s): "LIPASE", "AMYLASE" in the last 168 hours. No results for input(s): "AMMONIA" in the last 168 hours. CBC: Recent Labs  Lab 03/02/23 1250  WBC 11.7*  HGB 11.4*  HCT 36.7  MCV 98.9  PLT 371   Cardiac Enzymes: Recent Labs  Lab 03/02/23 1250  TROPONINIHS 34*    BNP (last 3 results) No results for input(s): "PROBNP" in the last 8760 hours. CBG: No results for input(s): "GLUCAP" in the last 168 hours.  Radiological Exams on Admission:  DG Chest Port 1 View Result Date: 03/02/2023 CLINICAL DATA:  Dyspnea EXAM: PORTABLE CHEST 1 VIEW COMPARISON:  Chest x-ray 09/24/2022. CT abdomen and pelvis 05/10/2019 FINDINGS: The aorta is ectatic. The heart is enlarged. There central pulmonary vascular congestion. Small pleural effusions are present. There is no pneumothorax or acute fracture. There severe atherosclerotic calcifications in the upper abdomen. IMPRESSION: Cardiomegaly with central pulmonary vascular congestion and small pleural effusions. Electronically Signed   By: Darliss Cheney M.D.   On: 03/02/2023 15:25     EKG: Independently reviewed.  Due to tachycardia and baseline artifact, rhythm is very hard to interpret, I suspect sinus tachycardia.  Patient currently has sinus tachycardia on bedside telemetry monitor.  No intake/output data recorded. No intake/output data recorded.      Assessment and Plan: * Pulmonary edema Patient has documented history of bilateral chronic lower extremity edema.  The patient has developed worsening hypoxemic respiratory failure over the course of the day today.  Now on 100% high flow nasal cannula barely maintaining 91% SpO2.  Chest x-ray showing bilateral pleural effusions as well as interstitial opacification.  Principal concern is fluid overload given the elevated BNP.  Patient has received  80 mg of Lasix, will monitor intake output and daily weights.  Will continue with 40 mg Lasix tomorrow.  However given elevated white count, we cannot rule out pneumonia and therefore patient has also been started on treatment with ceftriaxone azithromycin.  Blood cultures are pending.  D-dimer elevation in this context is very nonspecific, at this time will not pursue lower extremity Dopplers.  If persistent lower extremity swelling, can consider Doppler at that time.  Patient is not felt to have reactive airway disease exacerbation given her exam, some wheezes that were heard in the right upper lung field are felt to be upper airway breath sounds.  However will order empiric DuoNeb therapy, turn and position as well as Mucomyst therapy.  GI prophylaxis with pantoprazole will be ordered given the patient's precarious clinical status.  Last echo on file is from March 2023 with finding of left ventricular hypertrophy as well as diastolic dysfunction.  Also finding of mitral valve regurgitation.  As well as severe calcification of the aortic valve and severe aortic valve stenosis.  There is no evidence of patient having had repair of the aortic valve done.  Patient current presentation is consistent with above.  I do not think checking an  echo tonight would meaningfully change her clinical outcomes.  Therefore we will defer echo at this time  Encephalopathy Acute on chronic in the setting of underlying dementia.  This is due to hypoxia as well as need for Ativan to prevent patient from having anxiety and removing her oxygen supplementation.  Nonfocal.  Monitor clinically  At this time patient is not a candidate for oral diet.  Therefore low-dose D10 has been ordered with glucose monitoring with very sensitive insulin scale.  Once patient is awake and eating, we can DC insulin scale as well as IV fluids.   Medication reconciliation has been done as follows: Continue with aspirin, hydrocortisone as  needed for hemorrhoids, Cymbalta, omega-3 ethyl esters, diclofenac as needed for right knee pain, lidocaine 4% patch as needed for shoulder pain right Resume Fosamax at discharge.  Synthroid has been continued.  Check TSH level    Advance Care Planning:   Code Status: Limited: Do not attempt resuscitation (DNR) -DNR-LIMITED -Do Not Intubate/DNI  code status based on discussion of ER provder with son. Attempted to be corroborated by Thereasa Parkin but son did not take the call. On [phone]. No voicemail left.  Consults: none.  Family Communication: attempt again in AM  Severity of Illness: The appropriate patient status for this patient is INPATIENT. Inpatient status is judged to be reasonable and necessary in order to provide the required intensity of service to ensure the patient's safety. The patient's presenting symptoms, physical exam findings, and initial radiographic and laboratory data in the context of their chronic comorbidities is felt to place them at high risk for further clinical deterioration. Furthermore, it is not anticipated that the patient will be medically stable for discharge from the hospital within 2 midnights of admission.   * I certify that at the point of admission it is my clinical judgment that the patient will require inpatient hospital care spanning beyond 2 midnights from the point of admission due to high intensity of service, high risk for further deterioration and high frequency of surveillance required.*  Author: Nolberto Hanlon, MD 03/02/2023 5:57 PM  For on call review [email or web address].

## 2023-03-02 NOTE — ED Notes (Signed)
Pt is starting to become irritable, pulling at things, pulled her monitor off, oxygen sensor, and nasal canula out of her nose. Placed Mitts on her. Gave  of 0.5 mg of Ativan.

## 2023-03-02 NOTE — ED Notes (Signed)
Mittens applied for patients safety.

## 2023-03-02 NOTE — Progress Notes (Signed)
Patient unable to tolerate BiPAP. MD at bedside. Patient continues to pull it off. Patient placed on HHFNC at this time as patient is with increase WOB and oxygen level is low. Patient tolerating this better. Will continue to monitor.

## 2023-03-02 NOTE — ED Notes (Signed)
Pt in room on heated high flow. Was given ativan and is resting.

## 2023-03-02 NOTE — Progress Notes (Signed)
Provider:   Venita Sheffield MD Location:   [facility] [name]   Place of Service:   Skilled care   PCP: Krista Sheffield, MD Patient Care Team: Krista Sheffield, MD as PCP - General (Internal Medicine) [name] [name], [name], NP as Nurse Practitioner (Nurse Practitioner) Nelson Chimes, MD as Consulting Physician (Ophthalmology) Marcene Corning, MD as Consulting Physician (Orthopedic Surgery) [place], [name], MD as Consulting Physician (Dermatology) Hart Carwin, MD (Inactive) as Consulting Physician (Gastroenterology) Ovidio Kin, MD as Consulting Physician (General Surgery)  Extended Emergency Contact Information Primary Emergency Contact: KristaMartin Mobile Phone: [phone] Relation: Son Secondary Emergency Contact: Krista Bowman (Bernarda Caffey, [place] Mobile Phone: [phone] Relation: Daughter  Goals of Care: Advanced Directive information    02/19/2023   10:53 AM  Advanced Directives  Does Patient Have a Medical Advance Directive? Yes  Type of Estate agent of [place];Living will;Out of facility DNR (pink MOST or yellow form)  Does patient want to make changes to medical advance directive? No - Patient declined  Copy of Healthcare Power of Attorney in Chart? Yes - validated most recent copy scanned in chart (See row information)  Pre-existing out of facility DNR order (yellow form or pink MOST form) Pink MOST form placed in chart (order not valid for inpatient use)      No chief complaint on file.   HPI: Patient is a 89 y.o. female seen today for acute visit for SOB  As per nursing staff pt c/o sob while walking from her room to the living room. She is able to speak in full sentences. She was found to be tachycardic and hypoxic with o2 sat 88% on RA . She was immediately placed on 2 lit nasal cannula.  Pt denies chest pain, dizziness, cough , runny nose, abdominal pain. As per  staff pt sleeps on recliner chair Has chronic lower extremity swelling but no recent change    Past Medical History:  Diagnosis Date   Cerebral atrophy (HCC) 01/21/2013   Hyperlipidemia    Hypothyroid    Osteoarthrosis, unspecified whether generalized or localized, unspecified site    Osteoporosis, unspecified    Other and unspecified hyperlipidemia    Other generalized ischemic cerebrovascular disease 01/21/2013   small vessel disease   Syncope and collapse    Umbilical hernia without mention of obstruction or gangrene    Unspecified hearing loss    Unspecified hypothyroidism    Unspecified vitamin D deficiency    Ventral hernia, unspecified, without mention of obstruction or gangrene    Past Surgical History:  Procedure Laterality Date   CATARACT EXTRACTION W/ INTRAOCULAR LENS  IMPLANT, BILATERAL  2012   Dr. Hazle Quant   HERNIA REPAIR  01/2008   laparoscopic Dr. Ovidio Kin   INGUINAL HERNIA REPAIR N/A 05/10/2019   Procedure: LAPAROSCOPIC FEMORAL HERNIA REPAIR WITH MESH;  Surgeon: Axel Filler, MD;  Location: [facility] OR;  Service: General;  Laterality: N/A;   KNEE SURGERY  2008   Dr. Berneice Heinrich  left   TOE AMPUTATION  1986   little toe right foot  Dr. Cliffton Asters; [facility]    reports that she has never smoked. She has never used smokeless tobacco. She reports that she does not drink alcohol and does not use drugs. Social History   Socioeconomic History   Marital status: Widowed    Spouse name: Not on file   Number of children: Not on file  Years of education: Not on file   Highest education level: Not on file  Occupational History   Occupation: retired Nurse, mental health: RETIRED  Tobacco Use   Smoking status: Never   Smokeless tobacco: Never  Vaping Use   Vaping status: Never Used  Substance and Sexual Activity   Alcohol use: No   Drug use: No   Sexual activity: Never  Other Topics Concern   Not on file  Social History Narrative   [facility]  [name] [facility]   Widow   Living Will   Never smoked   No alcohol    Social Drivers of Corporate investment banker Strain: Low Risk  (01/08/2017)   Overall Financial Resource Strain (CARDIA)    Difficulty of Paying Living Expenses: Not hard at all  Food Insecurity: No Food Insecurity (01/08/2017)   Hunger Vital Sign    Worried About Running Out of Food in the Last Year: Never true    Ran Out of Food in the Last Year: Never true  Transportation Needs: No Transportation Needs (01/08/2017)   PRAPARE - Administrator, Civil Service (Medical): No    Lack of Transportation (Non-Medical): No  Physical Activity: Insufficiently Active (01/08/2017)   Exercise Vital Sign    Days of Exercise per Week: 6 days    Minutes of Exercise per Session: 20 min  Stress: No Stress Concern Present (01/08/2017)   [employer] of Occupational Health - Occupational Stress Questionnaire    Feeling of Stress : Not at all  Social Connections: Somewhat Isolated (01/08/2017)   Social Connection and Isolation Panel [NHANES]    Frequency of Communication with Friends and Family: Twice a week    Frequency of Social Gatherings with Friends and Family: Three times a week    Attends Religious Services: More than 4 times per year    Active Member of Clubs or Organizations: No    Attends Banker Meetings: Never    Marital Status: Widowed  Intimate Partner Violence: Not At Risk (01/08/2017)   Humiliation, Afraid, Rape, and Kick questionnaire    Fear of Current or Ex-Partner: No    Emotionally Abused: No    Physically Abused: No    Sexually Abused: No    Functional Status Survey:    Family History  Problem Relation Age of Onset   Melanoma Sister    Cancer Sister     Health Maintenance  Topic Date Due   Medicare Annual Wellness (AWV)  02/19/2024   DTaP/Tdap/Td (4 - Td or Tdap) 06/13/2027   Pneumonia Vaccine 77+ Years old  Completed   INFLUENZA VACCINE  Completed   DEXA SCAN   Completed   COVID-19 Vaccine  Completed   Zoster Vaccines- Shingrix  Completed   HPV VACCINES  Aged Out    Allergies  Allergen Reactions   Dust Mite Extract    Mushroom Extract Complex (Do Not Select) Other (See Comments)    "Allergic," per paperwork from facility   Penicillins Diarrhea and Other (See Comments)    "Allergic," per paperwork from facility    Outpatient Encounter Medications as of 03/02/2023  Medication Sig   acetaminophen (TYLENOL) 500 MG tablet Take 2 tablets (1,000 mg total) by mouth every 6 (six) hours as needed.   alendronate (FOSAMAX) 70 MG tablet Take 70 mg by mouth once a week. related to AGE-RELATED OSTEOPOROSIS WITHOUT CURRENT PATHOLOGICAL FRACTURE (M81.0)   aspirin 81 MG tablet Take 81 mg  by mouth daily.   Calcium Carbonate-Vitamin D 600-200 MG-UNIT TABS Take 1 tablet by mouth 2 (two) times daily.   diclofenac Sodium (VOLTAREN) 1 % GEL Apply 4 g topically every 6 (six) hours as needed (apply to right knee).   DULoxetine (CYMBALTA) 30 MG capsule Take 30 mg by mouth daily.   ferrous sulfate 325 (65 FE) MG EC tablet Take 325 mg by mouth daily.   furosemide (LASIX) 20 MG tablet Take 10 mg by mouth every Monday, Wednesday, and Friday.   HYDROcodone-acetaminophen (NORCO/VICODIN) 5-325 MG tablet Take 0.5 tablets by mouth 2 (two) times daily. Two listings per [employer] at [facility]   hydrocortisone (PROCTO-MED [facility]) 2.5 % rectal cream Place 1 Application rectally as needed for hemorrhoids or anal itching.   levothyroxine (SYNTHROID) 100 MCG tablet Take 100 mcg by mouth daily.   lidocaine (ASPERCREME LIDOCAINE) 4 % Place 1 patch onto the skin daily. Apply to SOLE OF RIGHT FOOT   Multiple Vitamin (MULTIVITAMIN WITH MINERALS) TABS Take 1 tablet by mouth daily.   NYAMYC powder Apply 1 Application topically. (Patient not taking: Reported on 02/19/2023)   Omega-3 Fatty Acids (FISH OIL) 1200 MG CAPS Take 1 capsule by mouth daily.   potassium chloride (KLOR-CON)  10 MEQ tablet Take 10 mEq by mouth every Monday, Wednesday, and Friday.   sennosides-docusate sodium (SENOKOT-S) 8.6-50 MG tablet Take 1 tablet by mouth daily.   Sodium Phosphates (FLEET ENEMA RE) Insert 1 unit rectally as needed for constipation repeat until positive results   Zinc Oxide 10 % OINT Apply 1 Application topically as directed. Every shift to buttocks for redness   No facility-administered encounter medications on file as of 03/02/2023.    Review of Systems  Constitutional:  Negative for fever.  Respiratory:  Positive for shortness of breath. Negative for cough and wheezing.   Cardiovascular:  Positive for leg swelling. Negative for chest pain and palpitations.  Gastrointestinal:  Negative for abdominal distention, abdominal pain, blood in stool, constipation, diarrhea, nausea and vomiting.  Genitourinary:  Negative for dysuria, frequency and urgency.  Neurological:  Negative for dizziness.  Psychiatric/Behavioral:  Negative for confusion.    Negative unless indicated in HPI.  There were no vitals filed for this visit. There is no height or weight on file to calculate BMI. BP Readings from Last 3 Encounters:  02/19/23 127/76  02/12/23 (!) 132/50  01/05/23 99/60   Wt Readings from Last 3 Encounters:  02/19/23 117 lb 14.4 oz (53.5 kg)  02/10/23 115 lb 14.4 oz (52.6 kg)  01/05/23 119 lb 8 oz (54.2 kg)   Physical Exam Constitutional:      Appearance: Normal appearance.  HENT:     Head: Normocephalic and atraumatic.  Cardiovascular:     Rate and Rhythm: Normal rate and regular rhythm.  Pulmonary:     Effort: Pulmonary effort is normal. No respiratory distress.     Breath sounds: Rales (lower 1/2) present. No wheezing.  Abdominal:     General: Bowel sounds are normal. There is no distension.     Tenderness: There is no abdominal tenderness. There is no guarding.     Comments:    Musculoskeletal:        General: Swelling (2+ pitting odema) present. No tenderness.   Neurological:     Mental Status: She is alert. Mental status is at baseline.     Sensory: No sensory deficit.     Motor: No weakness.     Labs reviewed:  Basic Metabolic Panel: Recent Labs    06/05/22 1715 09/10/22 1710 09/16/22 0000 09/25/22 0000 10/02/22 0000 12/30/22 0000  NA 139 138   < > 136* 138 137  K 3.8 3.4*   < > 4.4 4.5 4.6  CL 106 103   < > 102 105 100  CO2 22 26   < > 25* 25* 30*  GLUCOSE 118* 107*  --   --   --   --   BUN 17 26*   < > 25* 23* 26*  CREATININE 0.99 1.09*   < > 1.6* 1.0 1.1  CALCIUM 9.3 9.4   < > 9.2 8.9 9.8   < > = values in this interval not displayed.   Liver Function Tests: Recent Labs    06/05/22 1715 09/10/22 1710 09/25/22 0000 10/02/22 0000  AST 22 21 22 20   ALT 15 16 15 14   ALKPHOS 69 65 72 100  BILITOT 0.5 0.3  --   --   PROT 7.4 7.5  --   --   ALBUMIN 3.8 3.9 3.7 3.9   No results for input(s): "LIPASE", "AMYLASE" in the last 8760 hours. No results for input(s): "AMMONIA" in the last 8760 hours. CBC: Recent Labs    06/05/22 1715 09/10/22 1710 09/25/22 0000 10/02/22 0000 12/23/22 0000  WBC 8.6 9.4 6.3 7.0 5.8  NEUTROABS 5.7 6.5 4,448.00 4,095.00  --   HGB 11.8* 11.4* 9.1* 9.8* 9.5*  HCT 37.9 36.2 28* 30* 30*  MCV 93.6 92.6  --   --   --   PLT 347 347 402* 465* 281   Cardiac Enzymes: No results for input(s): "CKTOTAL", "CKMB", "CKMBINDEX", "TROPONINI" in the last 8760 hours. BNP: Invalid input(s): "POCBNP" Lab Results  Component Value Date   HGBA1C 5.9 (H) 01/31/2020   Lab Results  Component Value Date   TSH 2.25 03/20/2022   Lab Results  Component Value Date   VITAMINB12 477 10/02/2022   No results found for: "FOLATE" Lab Results  Component Value Date   IRON 41 10/02/2022   TIBC 304 10/02/2022   FERRITIN 68 10/02/2022    Imaging and Procedures obtained prior to SNF admission: CT Hip Right Wo Contrast Result Date: 09/24/2022 CLINICAL DATA:  Trauma to the right hip.  Concern for fracture. EXAM: CT  OF THE RIGHT HIP WITHOUT CONTRAST TECHNIQUE: Multidetector CT imaging of the right hip was performed according to the standard protocol. Multiplanar CT image reconstructions were also generated. RADIATION DOSE REDUCTION: This exam was performed according to the departmental dose-optimization program which includes automated exposure control, adjustment of the mA and/or kV according to patient size and/or use of iterative reconstruction technique. COMPARISON:  Radiograph dated 09/24/2022. FINDINGS: Bones/Joint/Cartilage There is no acute fracture or dislocation. The bones are osteopenic. Severe arthritic changes of the right hip with subcortical cystic changes of the femoral head and acetabulum. There is narrowing of the joint space with near bone on bone contact. No effusion. Ligaments Suboptimally assessed by CT. Muscles and Tendons No intramuscular fluid collection or hematoma. Soft tissues Contusion of the subcutaneous soft tissues of the lateral hip. No large hematoma or fluid collection. IMPRESSION: 1. No acute fracture or dislocation. 2. Severe arthritic changes of the right hip. Electronically Signed   By: Elgie Collard M.D.   On: 09/24/2022 01:54   CT Head Wo Contrast Result Date: 09/24/2022 CLINICAL DATA:  Trauma EXAM: CT HEAD WITHOUT CONTRAST CT CERVICAL SPINE WITHOUT CONTRAST TECHNIQUE: Multidetector CT imaging of the head and  cervical spine was performed following the standard protocol without intravenous contrast. Multiplanar CT image reconstructions of the cervical spine were also generated. RADIATION DOSE REDUCTION: This exam was performed according to the departmental dose-optimization program which includes automated exposure control, adjustment of the mA and/or kV according to patient size and/or use of iterative reconstruction technique. COMPARISON:  Head CT 09/10/2022 FINDINGS: CT HEAD FINDINGS Brain: There is no mass, hemorrhage or extra-axial collection. There is generalized atrophy  without lobar predilection. There is hypoattenuation of the periventricular white matter, most commonly indicating chronic ischemic microangiopathy. Vascular: No abnormal hyperdensity of the major intracranial arteries or dural venous sinuses. No intracranial atherosclerosis. Skull: The visualized skull base, calvarium and extracranial soft tissues are normal. Ground-glass focus in the frontal calvarium, possibly fibrous dysplasia. Sinuses/Orbits: No fluid levels or advanced mucosal thickening of the visualized paranasal sinuses. No mastoid or middle ear effusion. The orbits are normal. CT CERVICAL SPINE FINDINGS Alignment: No static subluxation. Facets are aligned. Occipital condyles are normally positioned. Skull base and vertebrae: No acute fracture. Soft tissues and spinal canal: No prevertebral fluid or swelling. No visible canal hematoma. Disc levels: No advanced spinal canal or neural foraminal stenosis. Upper chest: No pneumothorax, pulmonary nodule or pleural effusion. Other: Normal visualized paraspinal cervical soft tissues. IMPRESSION: 1. No acute intracranial abnormality. 2. Chronic ischemic microangiopathy and generalized atrophy. 3. No acute fracture or static subluxation of the cervical spine. Electronically Signed   By: Deatra Robinson M.D.   On: 09/24/2022 00:37   CT Cervical Spine Wo Contrast Result Date: 09/24/2022 CLINICAL DATA:  Trauma EXAM: CT HEAD WITHOUT CONTRAST CT CERVICAL SPINE WITHOUT CONTRAST TECHNIQUE: Multidetector CT imaging of the head and cervical spine was performed following the standard protocol without intravenous contrast. Multiplanar CT image reconstructions of the cervical spine were also generated. RADIATION DOSE REDUCTION: This exam was performed according to the departmental dose-optimization program which includes automated exposure control, adjustment of the mA and/or kV according to patient size and/or use of iterative reconstruction technique. COMPARISON:  Head CT  09/10/2022 FINDINGS: CT HEAD FINDINGS Brain: There is no mass, hemorrhage or extra-axial collection. There is generalized atrophy without lobar predilection. There is hypoattenuation of the periventricular white matter, most commonly indicating chronic ischemic microangiopathy. Vascular: No abnormal hyperdensity of the major intracranial arteries or dural venous sinuses. No intracranial atherosclerosis. Skull: The visualized skull base, calvarium and extracranial soft tissues are normal. Ground-glass focus in the frontal calvarium, possibly fibrous dysplasia. Sinuses/Orbits: No fluid levels or advanced mucosal thickening of the visualized paranasal sinuses. No mastoid or middle ear effusion. The orbits are normal. CT CERVICAL SPINE FINDINGS Alignment: No static subluxation. Facets are aligned. Occipital condyles are normally positioned. Skull base and vertebrae: No acute fracture. Soft tissues and spinal canal: No prevertebral fluid or swelling. No visible canal hematoma. Disc levels: No advanced spinal canal or neural foraminal stenosis. Upper chest: No pneumothorax, pulmonary nodule or pleural effusion. Other: Normal visualized paraspinal cervical soft tissues. IMPRESSION: 1. No acute intracranial abnormality. 2. Chronic ischemic microangiopathy and generalized atrophy. 3. No acute fracture or static subluxation of the cervical spine. Electronically Signed   By: Deatra Robinson M.D.   On: 09/24/2022 00:37   DG Hips Bilat W or Wo Pelvis 3-4 Views Result Date: 09/24/2022 CLINICAL DATA:  Status post fall. EXAM: DG HIP (WITH OR WITHOUT PELVIS) 3-4V BILAT COMPARISON:  September 10, 2022 FINDINGS: There is no evidence of an acute hip fracture or dislocation. Moderate severity degenerative changes are seen involving the  right hip, in the form of joint space narrowing and acetabular sclerosis. IMPRESSION: Moderate severity degenerative changes involving the right hip without evidence of an acute osseous abnormality.  Electronically Signed   By: Aram Candela M.D.   On: 09/24/2022 00:35   DG Chest Portable 1 View Result Date: 09/24/2022 CLINICAL DATA:  Status post fall. EXAM: PORTABLE CHEST 1 VIEW COMPARISON:  Jun 05, 2022 FINDINGS: The cardiac silhouette is mildly enlarged and unchanged in size. There is marked severity calcification of the thoracic aorta. Mild, diffuse, chronic appearing increased lung markings are seen without evidence of acute infiltrate, pleural effusion or pneumothorax. No acute osseous abnormalities are identified. IMPRESSION: Chronic appearing increased lung markings without evidence of acute or active cardiopulmonary disease. Electronically Signed   By: Aram Candela M.D.   On: 09/24/2022 00:32    Assessment/Plan  1. SOB (shortness of breath) (Primary) 2. Swelling of lower extremity Pt with new onset SOB with hypoxia  P2 sat 88% on 2 lit  O2 sat not improving even on 2 lit nasal cannula Pt is tachycardic  Will send the patient to ED  Family notified by the nursing staff    45 min Total time spent for obtaining history,  performing a medically appropriate examination and evaluation, reviewing the tests,ordering  tests,  documenting clinical information in the electronic or other health record,care coordination (not separately reported)

## 2023-03-03 ENCOUNTER — Other Ambulatory Visit: Payer: Self-pay

## 2023-03-03 ENCOUNTER — Inpatient Hospital Stay (HOSPITAL_COMMUNITY): Payer: PPO

## 2023-03-03 ENCOUNTER — Encounter (HOSPITAL_COMMUNITY): Payer: Self-pay | Admitting: Internal Medicine

## 2023-03-03 DIAGNOSIS — I350 Nonrheumatic aortic (valve) stenosis: Secondary | ICD-10-CM

## 2023-03-03 DIAGNOSIS — F0390 Unspecified dementia without behavioral disturbance: Secondary | ICD-10-CM | POA: Diagnosis not present

## 2023-03-03 DIAGNOSIS — I5033 Acute on chronic diastolic (congestive) heart failure: Secondary | ICD-10-CM | POA: Diagnosis not present

## 2023-03-03 DIAGNOSIS — J9601 Acute respiratory failure with hypoxia: Secondary | ICD-10-CM | POA: Diagnosis not present

## 2023-03-03 DIAGNOSIS — N179 Acute kidney failure, unspecified: Secondary | ICD-10-CM | POA: Diagnosis not present

## 2023-03-03 DIAGNOSIS — R609 Edema, unspecified: Secondary | ICD-10-CM

## 2023-03-03 LAB — BASIC METABOLIC PANEL
Anion gap: 12 (ref 5–15)
BUN: 33 mg/dL — ABNORMAL HIGH (ref 8–23)
CO2: 22 mmol/L (ref 22–32)
Calcium: 8 mg/dL — ABNORMAL LOW (ref 8.9–10.3)
Chloride: 105 mmol/L (ref 98–111)
Creatinine, Ser: 1.31 mg/dL — ABNORMAL HIGH (ref 0.44–1.00)
GFR, Estimated: 39 mL/min — ABNORMAL LOW (ref >60)
Glucose, Bld: 164 mg/dL — ABNORMAL HIGH (ref 70–99)
Potassium: 3.9 mmol/L (ref 3.5–5.1)
Sodium: 139 mmol/L (ref 135–145)

## 2023-03-03 LAB — MRSA NEXT GEN BY PCR, NASAL: MRSA by PCR Next Gen: NOT DETECTED

## 2023-03-03 LAB — CBC
HCT: 31.4 % — ABNORMAL LOW (ref 36.0–46.0)
HCT: 35.1 % — ABNORMAL LOW (ref 36.0–46.0)
Hemoglobin: 10 g/dL — ABNORMAL LOW (ref 12.0–15.0)
Hemoglobin: 11.2 g/dL — ABNORMAL LOW (ref 12.0–15.0)
MCH: 30.2 pg (ref 26.0–34.0)
MCH: 29.9 pg (ref 26.0–34.0)
MCHC: 31.8 g/dL (ref 30.0–36.0)
MCHC: 31.9 g/dL (ref 30.0–36.0)
MCV: 94.9 fL (ref 80.0–100.0)
MCV: 93.9 fL (ref 80.0–100.0)
Platelets: 335 10*3/uL (ref 150–400)
Platelets: 264 10*3/uL (ref 150–400)
RBC: 3.31 MIL/uL — ABNORMAL LOW (ref 3.87–5.11)
RBC: 3.74 MIL/uL — ABNORMAL LOW (ref 3.87–5.11)
RDW: 15.2 % (ref 11.5–15.5)
RDW: 15.2 % (ref 11.5–15.5)
WBC: 13.6 10*3/uL — ABNORMAL HIGH (ref 4.0–10.5)
WBC: 11.2 10*3/uL — ABNORMAL HIGH (ref 4.0–10.5)
nRBC: 0 % (ref 0.0–0.2)
nRBC: 0 % (ref 0.0–0.2)

## 2023-03-03 LAB — GLUCOSE, CAPILLARY
Glucose-Capillary: 152 mg/dL — ABNORMAL HIGH (ref 70–99)
Glucose-Capillary: 124 mg/dL — ABNORMAL HIGH (ref 70–99)
Glucose-Capillary: 135 mg/dL — ABNORMAL HIGH (ref 70–99)
Glucose-Capillary: 157 mg/dL — ABNORMAL HIGH (ref 70–99)
Glucose-Capillary: 181 mg/dL — ABNORMAL HIGH (ref 70–99)

## 2023-03-03 LAB — CBG MONITORING, ED: Glucose-Capillary: 143 mg/dL — ABNORMAL HIGH (ref 70–99)

## 2023-03-03 LAB — TROPONIN I (HIGH SENSITIVITY): Troponin I (High Sensitivity): 175 ng/L (ref <18)

## 2023-03-03 LAB — CREATININE, SERUM
Creatinine, Ser: 1.11 mg/dL — ABNORMAL HIGH (ref 0.44–1.00)
GFR, Estimated: 48 mL/min — ABNORMAL LOW (ref >60)

## 2023-03-03 LAB — APTT: aPTT: 28 s (ref 24–36)

## 2023-03-03 LAB — HEMOGLOBIN A1C
Hgb A1c MFr Bld: 5 % (ref 4.8–5.6)
Mean Plasma Glucose: 96.8 mg/dL

## 2023-03-03 LAB — PROCALCITONIN: Procalcitonin: 2.88 ng/mL

## 2023-03-03 LAB — PROTIME-INR
INR: 1.1 (ref 0.8–1.2)
Prothrombin Time: 14.7 s (ref 11.4–15.2)

## 2023-03-03 LAB — TSH: TSH: 0.912 u[IU]/mL (ref 0.350–4.500)

## 2023-03-03 MED ORDER — HYDROMORPHONE HCL 1 MG/ML IJ SOLN
0.5000 mg | Freq: Once | INTRAMUSCULAR | Status: AC
  Administered 2023-03-03: 0.5 mg via INTRAVENOUS
  Filled 2023-03-03: qty 1

## 2023-03-03 MED ORDER — LORAZEPAM 2 MG/ML IJ SOLN
0.5000 mg | INTRAMUSCULAR | Status: DC | PRN
  Administered 2023-03-03 – 2023-03-04 (×2): 0.5 mg via INTRAVENOUS
  Filled 2023-03-03: qty 1

## 2023-03-03 MED ORDER — MORPHINE SULFATE (PF) 2 MG/ML IV SOLN
1.0000 mg | Freq: Once | INTRAVENOUS | Status: AC | PRN
  Administered 2023-03-03: 1 mg via INTRAVENOUS
  Filled 2023-03-03: qty 1

## 2023-03-03 MED ORDER — ORAL CARE MOUTH RINSE: 15.0000 mL | OROMUCOSAL | Status: DC | PRN

## 2023-03-03 MED ORDER — HEPARIN (PORCINE) 25000 UT/250ML-% IV SOLN
600.0000 [IU]/h | INTRAVENOUS | Status: DC
Start: 2023-03-03 — End: 2023-03-03

## 2023-03-03 MED ORDER — HALOPERIDOL LACTATE 5 MG/ML IJ SOLN
2.0000 mg | Freq: Four times a day (QID) | INTRAMUSCULAR | Status: DC | PRN
  Administered 2023-03-03 – 2023-03-04 (×3): 2 mg via INTRAVENOUS
  Filled 2023-03-03 (×2): qty 1

## 2023-03-03 MED ORDER — ALBUTEROL SULFATE (2.5 MG/3ML) 0.083% IN NEBU
2.5000 mg | INHALATION_SOLUTION | Freq: Three times a day (TID) | RESPIRATORY_TRACT | Status: DC
  Administered 2023-03-03 – 2023-03-04 (×3): 2.5 mg via RESPIRATORY_TRACT
  Filled 2023-03-03 (×3): qty 3

## 2023-03-03 MED ORDER — OXYCODONE HCL 5 MG PO TABS: 5.0000 mg | ORAL_TABLET | Freq: Four times a day (QID) | ORAL | Status: DC | PRN

## 2023-03-03 MED ORDER — OXYCODONE HCL 5 MG PO TABS: 2.5000 mg | ORAL_TABLET | Freq: Four times a day (QID) | ORAL | Status: DC | PRN

## 2023-03-03 MED ORDER — ORAL CARE MOUTH RINSE
15.0000 mL | OROMUCOSAL | Status: DC
  Administered 2023-03-03 – 2023-03-05 (×7): 15 mL via OROMUCOSAL

## 2023-03-03 MED ORDER — CARMEX CLASSIC LIP BALM EX OINT
TOPICAL_OINTMENT | CUTANEOUS | Status: DC | PRN
  Administered 2023-03-03: 1 via TOPICAL
  Filled 2023-03-03: qty 10

## 2023-03-03 MED ORDER — ENOXAPARIN SODIUM 30 MG/0.3ML IJ SOSY
30.0000 mg | PREFILLED_SYRINGE | Freq: Every day | INTRAMUSCULAR | Status: DC
  Administered 2023-03-03: 30 mg via SUBCUTANEOUS
  Filled 2023-03-03: qty 0.3

## 2023-03-03 MED ORDER — SODIUM CHLORIDE 0.9 % IV BOLUS
250.0000 mL | Freq: Once | INTRAVENOUS | Status: AC
  Administered 2023-03-03: 250 mL via INTRAVENOUS

## 2023-03-03 MED ORDER — CHLORHEXIDINE GLUCONATE CLOTH 2 % EX PADS
6.0000 | MEDICATED_PAD | Freq: Every day | CUTANEOUS | Status: DC
  Administered 2023-03-03: 6 via TOPICAL

## 2023-03-03 MED ORDER — ENOXAPARIN SODIUM 30 MG/0.3ML IJ SOSY
30.0000 mg | PREFILLED_SYRINGE | Freq: Every day | INTRAMUSCULAR | Status: DC
  Administered 2023-03-04: 30 mg via SUBCUTANEOUS
  Filled 2023-03-03: qty 0.3

## 2023-03-03 NOTE — Hospital Course (Addendum)
 89 year old woman presented from memory care, PMH dementia presumably although not listed in her past medical history, with hypoxia.  Started on BiPAP but could not tolerate.  Consideration given to acute CHF.  Placed on high flow nasal cannula.  Admitted for hypoxia pulmonary edema, possible encephalopathy.  Consultants None   Procedures/Events None

## 2023-03-03 NOTE — Evaluation (Addendum)
 SLP Cancellation Note  Patient Details Name: Jaycie Kregel MRN: [id number] DOB: 28-Jan-1931   Cancelled treatment:       Reason Eval/Treat Not Completed: Other (comment) (received order for speech eval; per chart - pt is lethargic and potential plan for CT brain; will continue efforts for evaluation of speech and language; thanks for this order)  [name], MS [facility] SLP Acute Rehab Services Office [phone]  Nicolas Emmie Caldron 03/03/2023, 1:50 PM

## 2023-03-03 NOTE — Progress Notes (Addendum)
 Patient is inconsolable. Patient is yelling out for help, extremely agitated, striking out at staff despite multiple interventions ordered by J. Blondie, NP. Attempted increasing ativan  frequency, multiple PRNs, repositioning, and increasing O2 supplementation.   2330: attempting morphine  1 mg x1  0015: morphine  successful for approx. 30-45 minutes. NP notified. Will attempt Dilaudid   0115: Dilaudid  successful. Patient's HR decreased, O2 improved, and a decrease in tachypnea and overall distress has ceased.    9384: Patient awoke again, yelling, striking out. Dilaudid  has been the most successful intervention. 0.5 mg worked for 6 hours.  0630: Haldol  attempted without success, will give ativan . Patient is yelling and screaming for help, extremely restless. Patient notably desaturates during periods of agitation and pain.

## 2023-03-03 NOTE — Plan of Care (Signed)

## 2023-03-03 NOTE — Progress Notes (Signed)
 Pt began getting extremely agitated and started yelling.  Desaturation noted down to high 70s.  Fio2 on hhfnc increased to 100% with little improvement.  Another RT attempted to place pt back on bipap but pt would not allow.  Pt continued to yell/scream moving head constantly.  Pt placed on 15L NRB on top of HHFNC.  RN aware and is working on sedation meds at this time.  O2 sats slowly improving, currently mid to high 80s.

## 2023-03-03 NOTE — Progress Notes (Deleted)
 PHARMACY - ANTICOAGULATION CONSULT NOTE  Pharmacy Consult for IV heparin  Indication: ACS/STEMI  Allergies  Allergen Reactions   Dust Mite Extract Other (See Comments)    Allergic, per MAR   Mushroom Extract Complex (Do Not Select) Other (See Comments)    Allergic, per MAR   Penicillins Diarrhea and Other (See Comments)    Allergic, per [facility]    Patient Measurements: Height: 4' 10 (147.3 cm) Weight: 53.2 kg (117 lb 4.6 oz) IBW/kg (Calculated) : 40.9 Heparin  Dosing Weight: 51.7 kg  Vital Signs: Temp: 99.2 F (37.3 C) (02/04 1200) Temp Source: Oral (02/04 1200) BP: 105/51 (02/04 0740) Pulse Rate: 117 (02/04 0920)  Labs: Recent Labs    03/02/23 1250 03/03/23 0127 03/03/23 0256  HGB 11.4* 10.0* 11.2*  HCT 36.7 31.4* 35.1*  PLT 371 335 264  APTT  --  28  --   LABPROT  --  14.7  --   INR  --  1.1  --   CREATININE 0.91 1.11* 1.31*  TROPONINIHS 34* 175*  --     Estimated Creatinine Clearance: 21 mL/min (A) (by C-G formula based on SCr of 1.31 mg/dL (H)).   Medical History: Past Medical History:  Diagnosis Date   Cerebral atrophy (HCC) 01/21/2013   Hyperlipidemia    Hypothyroid    Osteoarthrosis, unspecified whether generalized or localized, unspecified site    Osteoporosis, unspecified    Other and unspecified hyperlipidemia    Other generalized ischemic cerebrovascular disease 01/21/2013   small vessel disease   Syncope and collapse    Umbilical hernia without mention of obstruction or gangrene    Unspecified hearing loss    Unspecified hypothyroidism    Unspecified vitamin D  deficiency    Ventral hernia, unspecified, without mention of obstruction or gangrene     Medications:  No prior to admission anticoagulant meds listed. Currently on enoxaparin  30 mg subcutaneous q24h for DVT prophylaxis, last dose 2/4 0858  Assessment: Pharmacy to dose IV heparin  for ACS/STEMI for this 89 yo female admitted for hypoxia pulmonary edema, possible encephalopathy.   EKG: tachycardia.  BNP elevated and Troponin I elevated.     Goal of Therapy:  Heparin  level 0.3-0.7 units/ml Monitor platelets by anticoagulation protocol: Yes   Plan:  Initiate IV heparin  (no bolus) continuous infusion at 600 units/hr Obtain 8 hour heparin  level for titration Monitor daily heparin  level, CBC, signs/symptoms of bleeding   Thank you for allowing pharmacy to be a part of this patient's care.  Eleanor EMERSON Agent, PharmD, BCPS Clinical Pharmacist [place] 03/03/2023 1:35 PM

## 2023-03-03 NOTE — Progress Notes (Signed)
Bilateral lower extremity venous duplex has been completed. Preliminary results can be found in CV Proc through chart review.   03/03/23 12:19 PM Olen Cordial RVT

## 2023-03-03 NOTE — TOC Initial Note (Addendum)
 Transition of Care [facility]) - Initial/Assessment Note   Patient Details  Name: Krista Bowman MRN: [id number] Date of Birth: [birth year]  Transition of Care [facility]) CM/SW Contact:    Krista GORMAN Aran, LCSW Phone Number: 03/03/2023, 10:43 AM  Clinical Narrative: CSW spoke with patient's daughter, Krista Bowman, regarding patient. Daughter confirmed patient is a resident at [facility] [place]) and the plan is to have patient discharge back there when medically stable. CSW spoke with Krista Bowman at [facility] to discuss discharge planning. Per Krista Bowman, the facility will accept the patient back even if she has been in mittens in the 24 hours before discharge as the facility knows her and wants her to be in a familiar environment. CSW also updated patient's son, Krista Bowman. FL2 started. TOC to follow.  Expected Discharge Plan: Skilled Nursing Facility Barriers to Discharge: Continued Medical Work up  Patient Goals and CMS Choice Patient states their goals for this hospitalization and ongoing recovery are:: Return to [name]  Expected Discharge Plan and Services In-house Referral: Clinical Social Work Post Acute Care Choice: Skilled Nursing Facility Living arrangements for the past 2 months: Skilled Nursing Facility  Prior Living Arrangements/Services Living arrangements for the past 2 months: Skilled Nursing Facility Lives with:: Facility Resident Patient language and need for interpreter reviewed:: Yes Do you feel safe going back to the place where you live?: Yes      Need for Family Participation in Patient Care: Yes (Comment) (Patient is oriented to self only.) Care giver support system in place?: Yes (comment) Criminal Activity/Legal Involvement Pertinent to Current Situation/Hospitalization: No - Comment as needed  Activities of Daily Living ADL Screening (condition at time of admission) Independently performs ADLs?: No Does the patient have a NEW  difficulty with bathing/dressing/toileting/self-feeding that is expected to last >3 days?: Yes (Initiates electronic notice to provider for possible OT consult) Does the patient have a NEW difficulty with getting in/out of bed, walking, or climbing stairs that is expected to last >3 days?: Yes (Initiates electronic notice to provider for possible PT consult) Does the patient have a NEW difficulty with communication that is expected to last >3 days?: Yes (Initiates electronic notice to provider for possible SLP consult) Is the patient deaf or have difficulty hearing?: Yes Does the patient have difficulty seeing, even when wearing glasses/contacts?: No Does the patient have difficulty concentrating, remembering, or making decisions?: Yes  Permission Sought/Granted Permission sought to share information with : Facility Industrial/product Designer granted to share information with : Yes, Verbal Permission Granted Permission granted to share info w AGENCY: [name]  Emotional Assessment Attitude/Demeanor/Rapport: Unable to Assess Affect (typically observed): Unable to Assess Orientation: : Oriented to Self Alcohol  / Substance Use: Not Applicable Psych Involvement: No (comment)  Admission diagnosis:  Pulmonary edema [J81.1] Respiratory distress [R06.03] Acute on chronic congestive heart failure, unspecified heart failure type [facility]) [I50.9] Patient Active Problem List   Diagnosis Date Noted   Pulmonary edema 03/02/2023   Encephalopathy 03/02/2023   Cognitive impairment 02/10/2023   Multiple bruises 09/24/2022   Hypokalemia 09/17/2022   GERD (gastroesophageal reflux disease) 06/10/2022   Traumatic hematoma of forehead 06/06/2022   Fall 04/28/2022   Hyponatremia 02/10/2022   Hearing loss 01/15/2022   Urinary frequency 04/22/2021   Anemia, chronic disease 03/07/2021   Bright red rectal bleeding 03/04/2021   Diarrhea 02/18/2021   Osteoarthritis, multiple sites 02/18/2021    Anxiety due to dementia (HCC) 02/18/2021   Dyslipidemia 10/10/2019  Aortic valve stenosis 04/15/2019   Educated about COVID-19 virus infection 04/13/2019   Cardiac murmur 05/21/2018   Weight loss 04/23/2018   OAB (overactive bladder) 09/29/2017   B12 deficiency 09/29/2017   Osteopenia 02/03/2017   Prediabetes 10/28/2016   CKD (chronic kidney disease) stage 3, GFR 30-59 ml/min (HCC) 10/28/2016   History of osteoporosis 10/28/2016   Bilateral leg edema 10/28/2016   Leg weakness 05/08/2016   Low back pain 05/24/2015   Constipation 01/13/2013   Right leg pain 12/14/2012   Abnormality of gait 01/29/2012   Edema 10/18/2009   Personal history of fall 06/05/2009   Mixed hyperlipidemia 04/01/2007   Hypothyroidism 12/11/2006   PCP:  Sherlynn Madden, MD Pharmacy:   [facility] [place], [place] - [address] [place] [address] Phone: [phone] Fax: [phone]  Social Drivers of Health (SDOH) Social History: SDOH Screenings   Food Insecurity: No Food Insecurity (03/03/2023)  Transportation Needs: No Transportation Needs (03/03/2023)  Depression (PHQ2-9): Low Risk  (02/25/2023)  Financial Resource Strain: Low Risk  (01/08/2017)  Physical Activity: Insufficiently Active (01/08/2017)  Social Connections: Somewhat Isolated (01/08/2017)  Stress: No Stress Concern Present (01/08/2017)  Tobacco Use: Low Risk  (03/03/2023)   SDOH Interventions:    Readmission Risk Interventions    03/03/2023   10:31 AM  Readmission Risk Prevention Plan  Transportation Screening Complete  HRI or Home Care Consult Complete  Social Work Consult for Recovery Care Planning/Counseling Complete  Palliative Care Screening Not Applicable  Medication Review Oceanographer) Complete

## 2023-03-03 NOTE — Progress Notes (Addendum)
 Progress Note   Patient: Krista Bowman FMW:[id number] DOB: 05-Oct-1931 DOA: 03/02/2023     1 DOS: the patient was seen and examined on 03/03/2023   Brief hospital course: 89 year old woman presented from memory care, PMH dementia presumably although not listed in her past medical history, with hypoxia.  Started on BiPAP but could not tolerate.  Consideration given to acute CHF.  Placed on high flow nasal cannula.  Admitted for hypoxia pulmonary edema, possible encephalopathy.  Consultants None   Procedures/Events None   Assessment and Plan: Severe acute hypoxic respiratory failure with respiratory distress  Acute on chronic diastolic dysfunction Severe aortic stenosis by echo 2023 Demand ischemia Presented with severe hypoxia, could not tolerate BiPAP, oxygenation acceptable in the 90s on high flow nasal cannula.  FiO2 70%. Modest to moderate increased respiratory effort but appears were relatively stable Suspect heart failure as primary driving diagnosis.  Her BNP is markedly elevated, and troponin has elevated, cannot rule out new cardiac event or ACS.  Her history is limited. Favor treating conservatively with heparin , trending troponins and checking echocardiogram.  Will check CT head first given memory deficits and dysarthria, unable to reach family, unclear what baseline is.  If CT head clear, start heparin . For now we will need antibiotics but doubt concomitant infection.  Check procalcitonin. D-dimer elevated but this study is not interpretable given her advanced age and clinical condition.  Will check bilateral lower extremity Dopplers for completeness.  Acute kidney injury Multifactorial, was given a dose of Lasix , probably being driven though by relative hypoperfusion secondary to CHF. Strict I/O Needs further diuresis  Dementia Encephalopathy considered on admission Resides in memory care unit    Subjective:  Speech difficult to understand History  unreliable  Physical Exam: Vitals:   03/03/23 0740 03/03/23 0800 03/03/23 0920 03/03/23 0925  BP: (!) 105/51     Pulse: 98  (!) 117   Resp: (!) 25  (!) 30   Temp:  99.7 F (37.6 C)    TempSrc:  Axillary    SpO2: 100%  100% 99%  Weight:      Height:       Physical Exam Vitals reviewed.  Constitutional:      General: She is in acute distress.     Appearance: She is ill-appearing. She is not toxic-appearing.  Cardiovascular:     Rate and Rhythm: Normal rate and regular rhythm.     Heart sounds: No murmur heard. Pulmonary:     Effort: No respiratory distress.     Breath sounds: No wheezing, rhonchi or rales.     Comments: Mild to moderate increased respiratory effort Abdominal:     General: There is no distension.     Palpations: Abdomen is soft.     Tenderness: There is no abdominal tenderness. There is no guarding.     Hernia: A hernia (ventral, easily reduciable) is present.  Musculoskeletal:     Right lower leg: Edema present.     Left lower leg: Edema present.  Neurological:     Mental Status: She is alert.  Psychiatric:     Comments: Suspected baseline.  Opens eyes and does speak and respond to questions although speech is very difficult to understand     Data Reviewed: CBG stable Creatinine up to 1.31 with baseline around 1.0 BNP 1302 Troponin 34 > 175 Hemoglobin stable 11.4, minimal leukocytosis D-dimer elevated, unclear why this study was ordered and an 89 year old woman   Family Communication:   Disposition: Status is:  Inpatient Remains inpatient appropriate because: hypoxia     Time spent: 45 minutes  Author: Toribio Door, MD 03/03/2023 11:13 AM  For on call review [email or web address].

## 2023-03-03 NOTE — Progress Notes (Signed)
RT transported pt from ED to1224 without event.

## 2023-03-03 NOTE — Progress Notes (Signed)
 Pt suddenly became extremely agitated, pulled off her oxygen, attempted to hit and bite staff, and began screaming. Placed pt back in mittens. Patient given 0.5mg  ativan  but continued to be combative and screamed throughout a bed change d/t purewick leak. Dr. Jadine notified.

## 2023-03-03 NOTE — ED Notes (Signed)
Patient oxygen saturation not coming back up. Called respiratory and floor coverage hospitalist and got recommendations. Put on bipap which she tolerated.

## 2023-03-04 ENCOUNTER — Inpatient Hospital Stay (HOSPITAL_COMMUNITY): Payer: PPO

## 2023-03-04 DIAGNOSIS — I5031 Acute diastolic (congestive) heart failure: Secondary | ICD-10-CM

## 2023-03-04 DIAGNOSIS — Z515 Encounter for palliative care: Secondary | ICD-10-CM | POA: Diagnosis not present

## 2023-03-04 DIAGNOSIS — Z7189 Other specified counseling: Secondary | ICD-10-CM

## 2023-03-04 DIAGNOSIS — J9601 Acute respiratory failure with hypoxia: Secondary | ICD-10-CM | POA: Diagnosis not present

## 2023-03-04 LAB — RESPIRATORY PANEL BY PCR

## 2023-03-04 LAB — CBC
HCT: 30.5 % — ABNORMAL LOW (ref 36.0–46.0)
Hemoglobin: 9.5 g/dL — ABNORMAL LOW (ref 12.0–15.0)
MCH: 30.2 pg (ref 26.0–34.0)
MCHC: 31.1 g/dL (ref 30.0–36.0)
MCV: 96.8 fL (ref 80.0–100.0)
Platelets: 242 10*3/uL (ref 150–400)
RBC: 3.15 MIL/uL — ABNORMAL LOW (ref 3.87–5.11)
RDW: 15.4 % (ref 11.5–15.5)
WBC: 11.2 10*3/uL — ABNORMAL HIGH (ref 4.0–10.5)
nRBC: 0 % (ref 0.0–0.2)

## 2023-03-04 LAB — ECHOCARDIOGRAM COMPLETE
AR max vel: 0.35 cm2
AV Area VTI: 0.33 cm2
AV Area mean vel: 0.33 cm2
AV Mean grad: 51 mm[Hg]
AV Peak grad: 83.2 mm[Hg]
Ao pk vel: 4.56 m/s
Area-P 1/2: 12.44 cm2
Calc EF: 41.9 %
Est EF: 40
Height: 58 in
MV M vel: 6 m/s
MV Peak grad: 144 mm[Hg]
MV VTI: 1.67 cm2
P 1/2 time: 180 ms
S' Lateral: 3.2 cm
Single Plane A2C EF: 46 %
Single Plane A4C EF: 39.4 %
Weight: 1869.5 [oz_av]

## 2023-03-04 LAB — GLUCOSE, CAPILLARY
Glucose-Capillary: 165 mg/dL — ABNORMAL HIGH (ref 70–99)
Glucose-Capillary: 175 mg/dL — ABNORMAL HIGH (ref 70–99)
Glucose-Capillary: 167 mg/dL — ABNORMAL HIGH (ref 70–99)

## 2023-03-04 LAB — BASIC METABOLIC PANEL
Anion gap: 12 (ref 5–15)
BUN: 30 mg/dL — ABNORMAL HIGH (ref 8–23)
CO2: 24 mmol/L (ref 22–32)
Calcium: 7.8 mg/dL — ABNORMAL LOW (ref 8.9–10.3)
Chloride: 104 mmol/L (ref 98–111)
Creatinine, Ser: 1.09 mg/dL — ABNORMAL HIGH (ref 0.44–1.00)
GFR, Estimated: 49 mL/min — ABNORMAL LOW (ref >60)
Glucose, Bld: 118 mg/dL — ABNORMAL HIGH (ref 70–99)
Potassium: 3 mmol/L — ABNORMAL LOW (ref 3.5–5.1)
Sodium: 140 mmol/L (ref 135–145)

## 2023-03-04 LAB — BRAIN NATRIURETIC PEPTIDE: B Natriuretic Peptide: 2438 pg/mL — ABNORMAL HIGH (ref 0.0–100.0)

## 2023-03-04 LAB — STREP PNEUMONIAE URINARY ANTIGEN: Strep Pneumo Urinary Antigen: NEGATIVE

## 2023-03-04 LAB — TROPONIN I (HIGH SENSITIVITY): Troponin I (High Sensitivity): 1325 ng/L (ref <18)

## 2023-03-04 MED ORDER — HYDROMORPHONE HCL 1 MG/ML IJ SOLN
0.5000 mg | INTRAMUSCULAR | Status: AC | PRN
  Administered 2023-03-04: 0.5 mg via INTRAVENOUS
  Filled 2023-03-04: qty 1

## 2023-03-04 MED ORDER — POTASSIUM CHLORIDE 10 MEQ/100ML IV SOLN
10.0000 meq | INTRAVENOUS | Status: AC
  Administered 2023-03-04 (×5): 10 meq via INTRAVENOUS
  Filled 2023-03-04 (×5): qty 100

## 2023-03-04 MED ORDER — GLYCOPYRROLATE 0.2 MG/ML IJ SOLN: 0.2000 mg | INTRAMUSCULAR | Status: DC | PRN

## 2023-03-04 MED ORDER — BIOTENE DRY MOUTH MT LIQD: 15.0000 mL | OROMUCOSAL | Status: DC | PRN

## 2023-03-04 MED ORDER — HYDROMORPHONE HCL 1 MG/ML IJ SOLN
0.5000 mg | Freq: Once | INTRAMUSCULAR | Status: AC | PRN
  Administered 2023-03-04: 0.5 mg via INTRAVENOUS
  Filled 2023-03-04: qty 1

## 2023-03-04 MED ORDER — MORPHINE 100MG IN NS 100ML (1MG/ML) PREMIX INFUSION
2.0000 mg/h | INTRAVENOUS | Status: DC
  Administered 2023-03-04: 2 mg/h via INTRAVENOUS
  Filled 2023-03-04: qty 100

## 2023-03-04 MED ORDER — HYDROMORPHONE HCL 1 MG/ML IJ SOLN
0.5000 mg | INTRAMUSCULAR | Status: DC | PRN
  Administered 2023-03-04: 0.5 mg via INTRAVENOUS
  Filled 2023-03-04: qty 1

## 2023-03-04 MED ORDER — GLYCOPYRROLATE 1 MG PO TABS: 1.0000 mg | ORAL_TABLET | ORAL | Status: DC | PRN

## 2023-03-04 MED ORDER — GLYCOPYRROLATE 0.2 MG/ML IJ SOLN
0.2000 mg | INTRAMUSCULAR | Status: DC | PRN
  Filled 2023-03-04: qty 1

## 2023-03-04 MED ORDER — MORPHINE BOLUS VIA INFUSION
2.0000 mg | INTRAVENOUS | Status: DC | PRN
  Administered 2023-03-04: 4 mg via INTRAVENOUS
  Administered 2023-03-04: 3 mg via INTRAVENOUS

## 2023-03-04 MED ORDER — FUROSEMIDE 10 MG/ML IJ SOLN
60.0000 mg | Freq: Two times a day (BID) | INTRAMUSCULAR | Status: DC
Start: 2023-03-04 — End: 2023-03-04
  Administered 2023-03-04: 60 mg via INTRAVENOUS
  Filled 2023-03-04: qty 6

## 2023-03-04 MED ORDER — POLYVINYL ALCOHOL 1.4 % OP SOLN: 1.0000 [drp] | Freq: Four times a day (QID) | OPHTHALMIC | Status: DC | PRN

## 2023-03-04 MED ORDER — MORPHINE SULFATE (PF) 2 MG/ML IV SOLN
1.0000 mg | INTRAVENOUS | Status: AC
  Administered 2023-03-04: 1 mg via INTRAVENOUS
  Filled 2023-03-04: qty 1

## 2023-03-04 NOTE — Progress Notes (Signed)
  Echocardiogram 2D Echocardiogram has been performed.  Casimiro Needle P Camey Edell 03/04/2023, 9:00 AM

## 2023-03-04 NOTE — Plan of Care (Signed)
  Problem: Nutritional: Goal: Maintenance of adequate nutrition will improve Outcome: Not Progressing Goal: Progress toward achieving an optimal weight will improve Outcome: Not Progressing   Problem: Health Behavior/Discharge Planning: Goal: Ability to manage health-related needs will improve Outcome: Not Progressing

## 2023-03-04 NOTE — Progress Notes (Addendum)
 OT Cancellation Note  Patient Details Name: Krista Bowman MRN: [id number] DOB: 15-Oct-1931   Cancelled Treatment:    Reason Eval/Treat Not Completed: Medical issues which prohibited therapy Nurse asking for therapy to hold today. OT to continue to follow and check back as schedule will allow on 2/6. Geofm LEYLAND, MS Acute Rehabilitation Department Office# [phone]  03/04/2023, 8:15 AM

## 2023-03-04 NOTE — Consult Note (Signed)
 Consultation Note Date: 03/04/2023   Patient Name: Krista Bowman  DOB: 1931-07-26  MRN: [id number]  Age / Sex: 89 y.o., female  PCP: Sherlynn Madden, MD Referring Physician: Perri DELENA Meliton Mickey., *  Reason for Consultation: Establishing goals of care  HPI/Patient Profile: 89 y.o. female  with past medical history of dementia, hypothyroidism, HLD admitted on 03/02/2023 with shortness of breath with concern for acute heart failure and pneumonia.   Clinical Assessment and Goals of Care: Consult received and chart review completed. Discussed with RN Ole. I met today with daughter, Krista, and pastor, [name], at bedside. I stepped away from bedside and discussed further with Krista along with other family over the phone - son Gladis and sister Dagoberto. We reviewed Ms. Isaacs current status and critical illness. We discussed the unlikelihood that she will survive this illness. I recommended consideration of comfort measures. We discussed initiation of morphine  infusion to ensure comfort care. We discussed titrating down oxygen slowly once comfortable recognizing oxygen as a form of life support and prolonging her suffering. We discussed goal for comfort care and all family agree. We discussed prognosis of likely more hours with focus on comfort care. Family are mostly concerned with comfort and allowing peaceful death.   I also discussed with Krista plans. She states that her mother wishes to be cremated. She has looked into [facility]. Krista also shares that her mother would like to have her body donated to science. We discussed and I provided her with information for [facility], [place], and [place] body donation programs. I also provided Krista note for work. Krista plans to come this evening but unlikely to stay all night. I encouraged self care and to say what she needs to say before she leaves. She understands that she will  be notified if her mother dies and if we do not reach her we would call her brother.   All questions/concerns addressed. Emotional support provided. Updated Dr. Perri.   Primary Decision Maker HCPOA daughter Krista Bowman)    SUMMARY OF RECOMMENDATIONS   - DNR/DNI - Full comfort care - Anticipate hospital death  Code Status/Advance Care Planning: DNR   Symptom Management:  Full comfort care. PRN medications ordered. Morphine  infusion ordered.   Prognosis:  Hours - Days  Discharge Planning: Anticipated Hospital Death      Primary Diagnoses: Present on Admission: **None**   I have reviewed the medical record, interviewed the patient and family, and examined the patient. The following aspects are pertinent.  Past Medical History:  Diagnosis Date   Cerebral atrophy (HCC) 01/21/2013   Hyperlipidemia    Hypothyroid    Osteoarthrosis, unspecified whether generalized or localized, unspecified site    Osteoporosis, unspecified    Other and unspecified hyperlipidemia    Other generalized ischemic cerebrovascular disease 01/21/2013   small vessel disease   Syncope and collapse    Umbilical hernia without mention of obstruction or gangrene    Unspecified hearing loss    Unspecified hypothyroidism    Unspecified vitamin D   deficiency    Ventral hernia, unspecified, without mention of obstruction or gangrene    Social History   Socioeconomic History   Marital status: Widowed    Spouse name: Not on file   Number of children: Not on file   Years of education: Not on file   Highest education level: Not on file  Occupational History   Occupation: retired Nurse, Mental Health: RETIRED  Tobacco Use   Smoking status: Never   Smokeless tobacco: Never  Vaping Use   Vaping status: Never Used  Substance and Sexual Activity   Alcohol  use: No   Drug use: No   Sexual activity: Never  Other Topics Concern   Not on file  Social History Narrative   Lives at [name] 03/2005   Widow   Living Will   Never smoked   No alcohol     Social Drivers of Corporate Investment Banker Strain: Low Risk  (01/08/2017)   Overall Financial Resource Strain (CARDIA)    Difficulty of Paying Living Expenses: Not hard at all  Food Insecurity: No Food Insecurity (03/03/2023)   Hunger Vital Sign    Worried About Running Out of Food in the Last Year: Never true    Ran Out of Food in the Last Year: Never true  Transportation Needs: No Transportation Needs (03/03/2023)   PRAPARE - Administrator, Civil Service (Medical): No    Lack of Transportation (Non-Medical): No  Physical Activity: Insufficiently Active (01/08/2017)   Exercise Vital Sign    Days of Exercise per Week: 6 days    Minutes of Exercise per Session: 20 min  Stress: No Stress Concern Present (01/08/2017)   [employer] of Occupational Health - Occupational Stress Questionnaire    Feeling of Stress : Not at all  Social Connections: Patient Unable To Answer (03/03/2023)   Social Connection and Isolation Panel [NHANES]    Frequency of Communication with Friends and Family: Patient unable to answer    Frequency of Social Gatherings with Friends and Family: Patient unable to answer    Attends Religious Services: Patient unable to answer    Active Member of Clubs or Organizations: Patient unable to answer    Attends Banker Meetings: Patient unable to answer    Marital Status: Patient unable to answer   Family History  Problem Relation Age of Onset   Melanoma Sister    Cancer Sister    Scheduled Meds:  acetylcysteine   4 mL Nebulization TID   albuterol   2.5 mg Nebulization TID   aspirin   81 mg Oral Daily   calcium -vitamin D   1 tablet Oral BID   Chlorhexidine  Gluconate Cloth  6 each Topical Daily   DULoxetine   30 mg Oral Daily   enoxaparin  (LOVENOX ) injection  30 mg Subcutaneous Daily   furosemide   60 mg Intravenous BID   insulin  aspart  0-6 Units Subcutaneous Q4H    levothyroxine   100 mcg Oral Daily    morphine  injection  1 mg Intravenous NOW   multivitamin with minerals  1 tablet Oral Daily   omega-3 acid ethyl esters  1 g Oral Daily   mouth rinse  15 mL Mouth Rinse 4 times per day   pantoprazole  (PROTONIX ) IV  40 mg Intravenous Q24H   senna-docusate  1 tablet Oral Daily   sodium chloride  flush  3 mL Intravenous Q12H   zinc  oxide   Topical BID   Continuous Infusions:  azithromycin  Stopped (  03/03/23 1621)   cefTRIAXone  (ROCEPHIN )  IV Stopped (03/03/23 1553)   dextrose  50 mL/hr at 03/04/23 1254   PRN Meds:.acetaminophen  **OR** acetaminophen , diclofenac  Sodium, haloperidol  lactate, hydrocortisone , lidocaine , lip balm, mouth rinse, polyethylene glycol Allergies  Allergen Reactions   Dust Mite Extract Other (See Comments)    Allergic, per MAR   Mushroom Extract Complex (Do Not Select) Other (See Comments)    Allergic, per MAR   Penicillins Diarrhea and Other (See Comments)    Allergic, per MAR   Review of Systems  Unable to perform ROS: Acuity of condition    Physical Exam Vitals and nursing note reviewed.  Constitutional:      General: She is in acute distress.     Appearance: She is cachectic. She is ill-appearing.     Comments: Frail  Cardiovascular:     Rate and Rhythm: Tachycardia present.  Pulmonary:     Effort: Tachypnea present. No accessory muscle usage or respiratory distress.  Abdominal:     General: Abdomen is flat.  Neurological:     Mental Status: She is unresponsive.     Vital Signs: BP 130/71   Pulse (!) 122   Temp 99.3 F (37.4 C) (Axillary)   Resp (!) 28   Ht 4' 10 (1.473 m)   Wt 53 kg   SpO2 95%   BMI 24.42 kg/m  Pain Scale: PAINAD   Pain Score: Asleep   SpO2: SpO2: 95 % O2 Device:SpO2: 95 % O2 Flow Rate: .O2 Flow Rate (L/min): 60 L/min  IO: Intake/output summary:  Intake/Output Summary (Last 24 hours) at 03/04/2023 1458 Last data filed at 03/04/2023 1254 Gross per 24 hour  Intake 2741.96 ml   Output 300 ml  Net 2441.96 ml    LBM: Last BM Date : 03/03/23 Baseline Weight: Weight: 53.5 kg Most recent weight: Weight: 53 kg     Palliative Assessment/Data:    Time Total: 80 min  Greater than 50%  of this time was spent counseling and coordinating care related to the above assessment and plan.  Signed by: Bernarda Kitty, NP Palliative Medicine Team Pager # [phone] (M-F 8a-5p) Team Phone # [phone] (Nights/Weekends)

## 2023-03-04 NOTE — Evaluation (Signed)
 SLP Cancellation Note  Patient Details Name: Krista Bowman MRN: [id number] DOB: 02/16/1931   Cancelled treatment:       Reason Eval/Treat Not Completed: Other (comment)   Nicolas Emmie Caldron 03/04/2023, 8:43 AM   [name], MS [facility] SLP Acute Rehab Services Office [phone]

## 2023-03-04 NOTE — Progress Notes (Signed)
       Overnight   NAME: Krista Bowman MRN: [id number] DOB : 1931/05/21    Date of Service   03/04/2023   HPI/Events of Note    Notified by RN earlier in the night for elevated PAINAD pain scale recognized after previously ordered medications were ineffective at alleviating yelling out and restlessness with desaturation, and tachycardia. Patient was in obvious distress.  With elevated PAINAD score patient was given Dilaudid  IV. This demonstrated relief with return of sinus rhythm and improved respirations, SpO2, respiratory rate. After this administration patient was no longer in distress.  Currently being readministered previously ordered medications, but may require ongoing pain control at a reduced dose from previous dose.Pain relief dose lasted approximately 5 hours before return of apparent distress corresponding with vitals.   Interventions/ Plan   Continue previous medications. Consider ongoing pain control. May require palliative consult.      Lynwood Kipper BSN MSNA MSN ACNPC-AG Acute Care Nurse Practitioner [facility]

## 2023-03-04 NOTE — Progress Notes (Addendum)
 PROGRESS NOTE    Krista Bowman  FMW:[id number] DOB: 1931/09/21 DOA: 03/02/2023 PCP: Sherlynn Madden, MD  Chief Complaint  Patient presents with   Shortness of Breath    Pt saturating in the 80's on 2L nasal canula. Pt normally on room air.    Brief Narrative:   89 year old woman presented from memory care, PMH dementia presumably although not listed in her past medical history, with hypoxia. Started on BiPAP but could not tolerate. Consideration given to acute CHF. Placed on high flow nasal cannula. Admitted for hypoxia pulmonary edema.  Assessment & Plan:   Principal Problem:   Acute respiratory failure with hypoxia (HCC) Active Problems:   Severe aortic valve stenosis   Acute on chronic diastolic CHF (congestive heart failure) (HCC)   AKI (acute kidney injury) (HCC)   Dementia without behavioral disturbance (HCC)  Goals of Care She's critically ill.  Discussed with daughter her illness, our concern for heart failure + infectious contributor (pneumonia - viral vs bacterial).  Discussed my significant concern that she will not recover in the setting of her delirium/acute respiratory failure.  Will plan to discuss additionally in person.    Now comfort measures after discussion with palliative.  Followed up this PM, daughter not yet ready to wean oxygen.  She's going to talk to Damain Broadus friend.  Will let us  know when she's ready.   Acute Hypoxic Respiratory Failure Suspect multifactorial, HF, AS, likely infectious  See below  Acute Heart Failure with preserved Ejection Fraction Severe Aortic Stenosis  Requiring 60 L HHFNC 100% FiO2 CXR 2/3 at presentation with increasing perihilar effusions, bilateral pleural effusions Echo today pending Will continue lasix , increase to BID - follow tolerance, BP I/O, daily weights  Fever  Pneumonia Fever 2/4 She's on abx for CAP with ceftriaxone /azithromycin  Negative MRSA PCR Urine strep, legionella I don't think she can currently  provide Parveen Freehling sputum sample with her encephalopathy, consider if this changes  Elevated Troponin Suspect this represents demand in setting of above Will follow echo results  AKI Improved, follow with diuresis   Acute Metabolic Encephalopathy Delirium Dementia Delirium in setting of dementia and hospitalization with resp failure Delirium precautions  Haldol  prn  NPO until her mental status improves  Hypokalemia Replace   Hypothyroidism Synthroid    Depression Cymbalta     DVT prophylaxis: lovenox   Code Status: full Family Communication: daughter Disposition:   Status is: Inpatient Remains inpatient appropriate because: need for inpatient care   Consultants:  none  Procedures:  none  Antimicrobials:  Anti-infectives (From admission, onward)    Start     Dose/Rate Route Frequency Ordered Stop   03/02/23 1600  cefTRIAXone  (ROCEPHIN ) 1 g in sodium chloride  0.9 % 100 mL IVPB        1 g 200 mL/hr over 30 Minutes Intravenous Every 24 hours 03/02/23 1547     03/02/23 1600  azithromycin  (ZITHROMAX ) 500 mg in sodium chloride  0.9 % 250 mL IVPB        500 mg 250 mL/hr over 60 Minutes Intravenous Every 24 hours 03/02/23 1547 2023-03-21 1559       Subjective: Discussed with daughter over phone Moaning, sounds like she said she wants out of the bed   Objective: Vitals:   03/04/23 0500 03/04/23 0600 03/04/23 0700 03/04/23 0738  BP: (!) 123/44 130/84 (!) 128/53   Pulse: (!) 41 (!) 102 (!) 111   Resp: (!) 22 (!) 23 (!) 30   Temp:    98.2 F (36.8 C)  TempSrc:  Axillary  SpO2: 98% 94% (!) 88% 90%  Weight:      Height:        Intake/Output Summary (Last 24 hours) at 03/04/2023 0858 Last data filed at 03/04/2023 0701 Gross per 24 hour  Intake 2049.58 ml  Output 1500 ml  Net 549.58 ml   Filed Weights   03/03/23 0020 03/03/23 0232 03/04/23 0444  Weight: 53.5 kg 53.2 kg 53 kg    Examination:  General exam: acutely ill appearing elderly female  Respiratory system:  diminished anterior exam Cardiovascular system: tachy, regular Gastrointestinal system: Abdomen is nondistended, soft and nontender. Central nervous system: delirious, moaning and yelling Extremities: trace edema in bilateral LE    Data Reviewed: I have personally reviewed following labs and imaging studies  CBC: Recent Labs  Lab 03/02/23 1250 03/03/23 0127 03/03/23 0256 03/04/23 0257  WBC 11.7* 13.6* 11.2* 11.2*  HGB 11.4* 10.0* 11.2* 9.5*  HCT 36.7 31.4* 35.1* 30.5*  MCV 98.9 94.9 93.9 96.8  PLT 371 335 264 242    Basic Metabolic Panel: Recent Labs  Lab 03/02/23 1250 03/03/23 0127 03/03/23 0256 03/04/23 0257  NA 138  --  139 140  K 3.7  --  3.9 3.0*  CL 106  --  105 104  CO2 20*  --  22 24  GLUCOSE 127*  --  164* 118*  BUN 30*  --  33* 30*  CREATININE 0.91 1.11* 1.31* 1.09*  CALCIUM  8.5*  --  8.0* 7.8*    GFR: Estimated Creatinine Clearance: 25.2 mL/min (Kassidie Hendriks) (by C-G formula based on SCr of 1.09 mg/dL (H)).  Liver Function Tests: Recent Labs  Lab 03/02/23 1250  AST 32  ALT 23  ALKPHOS 71  BILITOT 0.5  PROT 6.6  ALBUMIN  3.4*    CBG: Recent Labs  Lab 03/03/23 1125 03/03/23 1635 03/03/23 2008 03/04/23 0138 03/04/23 0802  GLUCAP 135* 157* 181* 165* 175*     Recent Results (from the past 240 hours)  Resp panel by RT-PCR (RSV, Flu Rosendo Couser&B, Covid) Anterior Nasal Swab     Status: None   Collection Time: 03/02/23  1:55 PM   Specimen: Anterior Nasal Swab  Result Value Ref Range Status   SARS Coronavirus 2 by RT PCR NEGATIVE NEGATIVE Final    Comment: (NOTE) SARS-CoV-2 target nucleic acids are NOT DETECTED.  The SARS-CoV-2 RNA is generally detectable in upper respiratory specimens during the acute phase of infection. The lowest concentration of SARS-CoV-2 viral copies this assay can detect is 138 copies/mL. Yanina Knupp negative result does not preclude SARS-Cov-2 infection and should not be used as the sole basis for treatment or other patient management  decisions. Vonn Sliger negative result may occur with  improper specimen collection/handling, submission of specimen other than nasopharyngeal swab, presence of viral mutation(s) within the areas targeted by this assay, and inadequate number of viral copies(<138 copies/mL). Idona Stach negative result must be combined with clinical observations, patient history, and epidemiological information. The expected result is Negative.  Fact Sheet for Patients:  bloggercourse.com  Fact Sheet for Healthcare Providers:  seriousbroker.it  This test is no t yet approved or cleared by the [place]  FDA and  has been authorized for detection and/or diagnosis of SARS-CoV-2 by FDA under an Emergency Use Authorization (EUA). This EUA will remain  in effect (meaning this test can be used) for the duration of the COVID-19 declaration under Section 564(b)(1) of the Act, 21 U.S.C.section 360bbb-3(b)(1), unless the authorization is terminated  or revoked sooner.  Influenza Billey Wojciak by PCR NEGATIVE NEGATIVE Final   Influenza B by PCR NEGATIVE NEGATIVE Final    Comment: (NOTE) The Xpert Xpress SARS-CoV-2/FLU/RSV plus assay is intended as an aid in the diagnosis of influenza from Nasopharyngeal swab specimens and should not be used as [place] Vanaken sole basis for treatment. Nasal washings and aspirates are unacceptable for Xpert Xpress SARS-CoV-2/FLU/RSV testing.  Fact Sheet for Patients: bloggercourse.com  Fact Sheet for Healthcare Providers: seriousbroker.it  This test is not yet approved or cleared by the [place]  FDA and has been authorized for detection and/or diagnosis of SARS-CoV-2 by FDA under an Emergency Use Authorization (EUA). This EUA will remain in effect (meaning this test can be used) for the duration of the COVID-19 declaration under Section 564(b)(1) of the Act, 21 U.S.C. section 360bbb-3(b)(1), unless the  authorization is terminated or revoked.     Resp Syncytial Virus by PCR NEGATIVE NEGATIVE Final    Comment: (NOTE) Fact Sheet for Patients: bloggercourse.com  Fact Sheet for Healthcare Providers: seriousbroker.it  This test is not yet approved or cleared by the [place]  FDA and has been authorized for detection and/or diagnosis of SARS-CoV-2 by FDA under an Emergency Use Authorization (EUA). This EUA will remain in effect (meaning this test can be used) for the duration of the COVID-19 declaration under Section 564(b)(1) of the Act, 21 U.S.C. section 360bbb-3(b)(1), unless the authorization is terminated or revoked.  Performed at [facility], [address], [place], [place] [address]   Culture, blood (Routine X 2) w Reflex to ID Panel     Status: None (Preliminary result)   Collection Time: 03/03/23  1:27 AM   Specimen: BLOOD  Result Value Ref Range Status   Specimen Description   Final    BLOOD RIGHT ANTECUBITAL Performed at [facility], [address], [place], [place] [address]    Special Requests   Final    BOTTLES DRAWN AEROBIC AND ANAEROBIC Blood Culture adequate volume Performed at [facility], [address], [place], [place] [address]    Culture   Final    NO GROWTH 1 DAY Performed at [facility], [address] [place], [place] [address]    Report Status PENDING  Incomplete  MRSA Next Gen by PCR, Nasal     Status: None   Collection Time: 03/03/23  2:26 AM   Specimen: Nasal Mucosa; Nasal Swab  Result Value Ref Range Status   MRSA by PCR Next Gen NOT DETECTED NOT DETECTED Final    Comment: (NOTE) The GeneXpert MRSA Assay (FDA approved for NASAL specimens only), is one component of [place] comprehensive MRSA colonization surveillance program. It is not intended to diagnose MRSA infection nor to guide or monitor treatment for MRSA  infections. Test performance is not FDA approved in patients less than 105 years old. Performed at [facility], [address], [place], [place] [address]   Culture, blood (Routine X 2) w Reflex to ID Panel     Status: None (Preliminary result)   Collection Time: 03/03/23  2:56 AM   Specimen: BLOOD RIGHT ARM  Result Value Ref Range Status   Specimen Description   Final    BLOOD RIGHT ARM Performed at [facility], [address], [place], [place] [address]    Special Requests   Final    BOTTLES DRAWN AEROBIC ONLY Blood Culture results may not be optimal due to an inadequate volume of blood received in  culture bottles Performed at [facility], [address] [place], [place] [address]    Culture   Final    NO GROWTH 1 DAY Performed at [facility], [address], [place], [place] [address]    Report Status PENDING  Incomplete         Radiology Studies: VAS US  LOWER EXTREMITY VENOUS (DVT) Result Date: 03/03/2023  Lower Venous DVT Study Patient Name:  KENAE LINDQUIST  Date of Exam:   03/03/2023 Medical Rec #: [id number]       Accession #:    [id number] Date of Birth: [birth year]       Patient Gender: F Patient Age:   89 years Exam Location:  [facility] Procedure:      VAS US  LOWER EXTREMITY VENOUS (DVT) Referring Phys: TORIBIO DOOR --------------------------------------------------------------------------------  Indications: Edema.  Risk Factors: None identified. Limitations: Poor ultrasound/tissue interface and patient pain tolerance, patient positioning, patient movement, poor patient cooperation. Comparison Study: No prior studies. Performing Technologist: Cordella Collet RVT  Examination Guidelines: Kyrene Longan complete evaluation includes B-mode imaging, spectral Doppler, color Doppler, and power Doppler as needed of all accessible portions of each vessel. Bilateral testing is considered an integral part of Tyrome Donatelli complete  examination. Limited examinations for reoccurring indications may be performed as noted. The reflux portion of the exam is performed with the patient in reverse Trendelenburg.  +---------+---------------+---------+-----------+----------+-------------------+ RIGHT    CompressibilityPhasicitySpontaneityPropertiesThrombus Aging      +---------+---------------+---------+-----------+----------+-------------------+ CFV      Full           Yes      Yes                                      +---------+---------------+---------+-----------+----------+-------------------+ SFJ      Full                                                             +---------+---------------+---------+-----------+----------+-------------------+ FV Prox  Full                                                             +---------+---------------+---------+-----------+----------+-------------------+ FV Mid   Full                                                             +---------+---------------+---------+-----------+----------+-------------------+ FV DistalFull                                                             +---------+---------------+---------+-----------+----------+-------------------+ PFV      Full                                                             +---------+---------------+---------+-----------+----------+-------------------+  POP      Full           Yes      Yes                                      +---------+---------------+---------+-----------+----------+-------------------+ PTV                                                   Not well visualized +---------+---------------+---------+-----------+----------+-------------------+ PERO                                                  Not well visualized +---------+---------------+---------+-----------+----------+-------------------+    +---------+---------------+---------+-----------+----------+-------------------+ LEFT     CompressibilityPhasicitySpontaneityPropertiesThrombus Aging      +---------+---------------+---------+-----------+----------+-------------------+ CFV      Full           Yes      Yes                                      +---------+---------------+---------+-----------+----------+-------------------+ SFJ      Full                                                             +---------+---------------+---------+-----------+----------+-------------------+ FV Prox                 Yes      Yes                                      +---------+---------------+---------+-----------+----------+-------------------+ FV Mid   Full                                                             +---------+---------------+---------+-----------+----------+-------------------+ FV Distal               Yes      Yes                                      +---------+---------------+---------+-----------+----------+-------------------+ PFV      Full                                                             +---------+---------------+---------+-----------+----------+-------------------+ POP                     Yes  Yes                                      +---------+---------------+---------+-----------+----------+-------------------+ PTV                                                   Not well visualized +---------+---------------+---------+-----------+----------+-------------------+ PERO                                                  Not well visualized +---------+---------------+---------+-----------+----------+-------------------+     Summary: RIGHT: - There is no evidence of deep vein thrombosis in the lower extremity. However, portions of this examination were limited- see technologist comments above.  - No cystic structure found in the popliteal fossa.  LEFT: - There is  no evidence of deep vein thrombosis in the lower extremity. However, portions of this examination were limited- see technologist comments above.  - No cystic structure found in the popliteal fossa.  *See table(s) above for measurements and observations. Electronically signed by Norman Serve on 03/03/2023 at 1:58:02 PM.    Final    DG CHEST PORT 1 VIEW Result Date: 03/02/2023 CLINICAL DATA:  Hypoxia.  Shortness of breath. EXAM: PORTABLE CHEST 1 VIEW COMPARISON:  03/02/2023 FINDINGS: Patient positioning and rotation limits examination. Shallow inspiration. Moderate bilateral pleural effusions are likely unchanged given differences in patient positioning. Cardiac enlargement with perihilar infiltrates, increasing. No pneumothorax. Calcification of the aorta. IMPRESSION: Cardiac enlargement. Increasing perihilar effusions likely edema. Similar bilateral pleural effusions. Electronically Signed   By: Elsie Gravely M.D.   On: 03/02/2023 19:18   DG Chest Port 1 View Result Date: 03/02/2023 CLINICAL DATA:  Dyspnea EXAM: PORTABLE CHEST 1 VIEW COMPARISON:  Chest x-ray 09/24/2022. CT abdomen and pelvis 05/10/2019 FINDINGS: The aorta is ectatic. The heart is enlarged. There central pulmonary vascular congestion. Small pleural effusions are present. There is no pneumothorax or acute fracture. There severe atherosclerotic calcifications in the upper abdomen. IMPRESSION: Cardiomegaly with central pulmonary vascular congestion and small pleural effusions. Electronically Signed   By: Greig Pique M.D.   On: 03/02/2023 15:25        Scheduled Meds:  acetylcysteine   4 mL Nebulization TID   albuterol   2.5 mg Nebulization TID   aspirin   81 mg Oral Daily   calcium -vitamin D   1 tablet Oral BID   Chlorhexidine  Gluconate Cloth  6 each Topical Daily   DULoxetine   30 mg Oral Daily   enoxaparin  (LOVENOX ) injection  30 mg Subcutaneous Daily   furosemide   60 mg Intravenous BID   insulin  aspart  0-6 Units Subcutaneous Q4H    levothyroxine   100 mcg Oral Daily   multivitamin with minerals  1 tablet Oral Daily   omega-3 acid ethyl esters  1 g Oral Daily   mouth rinse  15 mL Mouth Rinse 4 times per day   pantoprazole  (PROTONIX ) IV  40 mg Intravenous Q24H   senna-docusate  1 tablet Oral Daily   sodium chloride  flush  3 mL Intravenous Q12H   zinc  oxide   Topical BID   Continuous Infusions:  azithromycin  Stopped (03/03/23 1621)   cefTRIAXone  (ROCEPHIN )  IV Stopped (03/03/23 1553)   dextrose  50 mL/hr at 03/04/23 0701   potassium chloride  10 mEq (03/04/23 0705)     LOS: 2 days    Time spent: 40 min critical care time due to delirium, heart failure, severe AHRF    Meliton Monte, MD [facility]   To contact the attending provider between 7A-7P or the covering provider during after hours 7P-7A, please log into the web site [email or web address] and access using universal [place] password for that web site. If you do not have the password, please call the hospital operator.  03/04/2023, 8:58 AM

## 2023-03-04 NOTE — Progress Notes (Signed)
 Patient's daughter is not ready for this writer to wean oxygen at this time.  RN aware.  Patient's daughter will let us  know when she is ready.

## 2023-03-04 NOTE — Progress Notes (Signed)
 Palliative:  Discussed with daughter [name] Rae), son Gladis, and sister Dagoberto. All agree with full comfort care measures. Will begin morphine  infusion. Wean oxygen slowly once morphine  infusion in system and she is comfortable. Continue to wean oxygen slowly ensuring she remains comfortable (bolus and titrate morphine  infusion as needed).   No charge  Bernarda Kitty, NP Palliative Medicine Team Pager [phone] (Please see [email or web address] for schedule) Team Phone [phone]

## 2023-03-04 NOTE — Progress Notes (Signed)
 PT Cancellation Note  Patient Details Name: Krista Bowman MRN: [id number] DOB: 03-14-1931   Cancelled Treatment:    Reason Eval/Treat Not Completed: Patient not medically ready. Nurse requesting hold. PT to continue to follow acutely.   [place], PT Acute Rehab   [place] VEAR Drone 03/04/2023, 9:26 AM

## 2023-03-04 NOTE — Plan of Care (Signed)
   Problem: Fluid Volume: Goal: Ability to maintain a balanced intake and output will improve Outcome: Progressing   Problem: Metabolic: Goal: Ability to maintain appropriate glucose levels will improve Outcome: Progressing   Problem: Skin Integrity: Goal: Risk for impaired skin integrity will decrease Outcome: Progressing

## 2023-03-05 DIAGNOSIS — Z515 Encounter for palliative care: Secondary | ICD-10-CM

## 2023-03-05 DIAGNOSIS — J189 Pneumonia, unspecified organism: Secondary | ICD-10-CM | POA: Insufficient documentation

## 2023-03-05 DIAGNOSIS — I509 Heart failure, unspecified: Secondary | ICD-10-CM | POA: Diagnosis not present

## 2023-03-05 DIAGNOSIS — I214 Non-ST elevation (NSTEMI) myocardial infarction: Secondary | ICD-10-CM | POA: Insufficient documentation

## 2023-03-05 DIAGNOSIS — Z66 Do not resuscitate: Secondary | ICD-10-CM

## 2023-03-05 DIAGNOSIS — Z7189 Other specified counseling: Secondary | ICD-10-CM | POA: Diagnosis not present

## 2023-03-05 DIAGNOSIS — J9601 Acute respiratory failure with hypoxia: Secondary | ICD-10-CM | POA: Diagnosis not present

## 2023-03-05 DIAGNOSIS — I5021 Acute systolic (congestive) heart failure: Secondary | ICD-10-CM | POA: Insufficient documentation

## 2023-03-05 LAB — LEGIONELLA PNEUMOPHILA SEROGP 1 UR AG: L. pneumophila Serogp 1 Ur Ag: NEGATIVE

## 2023-03-05 LAB — GLUCOSE, CAPILLARY: Glucose-Capillary: 130 mg/dL — ABNORMAL HIGH (ref 70–99)

## 2023-03-05 MED ORDER — OXYCODONE HCL 20 MG/ML PO CONC
5.0000 mg | ORAL | Status: DC | PRN
  Administered 2023-03-05: 5 mg via ORAL
  Filled 2023-03-05: qty 0.5

## 2023-03-05 MED ORDER — OXYCODONE HCL 20 MG/ML PO CONC: 5.0000 mg | ORAL | Status: DC | PRN

## 2023-03-05 MED ORDER — OXYCODONE HCL 20 MG/ML PO CONC: 5.0000 mg | ORAL | Status: DC

## 2023-03-05 MED ORDER — OXYCODONE HCL 20 MG/ML PO CONC
5.0000 mg | ORAL | Status: DC
  Administered 2023-03-05 (×2): 5 mg via ORAL
  Filled 2023-03-05 (×2): qty 0.5

## 2023-03-08 LAB — CULTURE, BLOOD (ROUTINE X 2)
Culture: NO GROWTH
Culture: NO GROWTH
Special Requests: ADEQUATE

## 2023-03-28 NOTE — Progress Notes (Signed)
 Heart Failure Navigator Progress Note  Assessed for Heart & Vascular TOC clinic readiness.  Patient does not meet criteria due to transitioned to Comfort Care.   Navigator will sign off at this time.   Stephane Haddock, BSN, Scientist, Clinical (histocompatibility And Immunogenetics) Only

## 2023-03-28 NOTE — Death Summary Note (Signed)
 DEATH SUMMARY   Patient Details  Name: Krista Bowman MRN: [id number] DOB: 08-Apr-1931 ERE:Czolijwip, Jackalyn, MD Admission/Discharge Information   Admit Date:  Mar 10, 2023  Date of Death: Date of Death: 03/13/23  Time of Death: Time of Death: 1600  Length of Stay: 3   Principle Cause of death: acute hypoxic respiratory failure due to acute systolic heart failure exacerbation, NSTEMI, and pneumonia  Hospital Diagnoses: Principal Problem:   Acute respiratory failure with hypoxia (HCC) Active Problems:   Severe aortic valve stenosis   Acute on chronic diastolic CHF (congestive heart failure) (HCC)   AKI (acute kidney injury) (HCC)   Dementia without behavioral disturbance (HCC)   Acute clinical systolic heart failure (HCC)   NSTEMI (non-ST elevated myocardial infarction) (HCC)   CAP (community acquired pneumonia)   Pleural effusion due to CHF (congestive heart failure) (HCC)   Comfort measures only status   Hospital Course:  89 year old woman presented from memory care, PMH dementia presumably although not listed in her past medical history, with hypoxia.  She was admitted for acute systolic heart failure exacerbation.  Based on echo with new wall motion abnormalities and elevated troponin, possibly precipitated by NSTEMI.  Her hospitalization c/b fever and she was also started on antibiotics to cover for pneumonia.  Despite diuresis and antibiotics, she required heated high flow nasal cannula up to 60 L 100% FiO2.  She developed delirium and after discussion with daughter/son and involvement of palliative, we made the decision to transition her to comfort measures.  She passed away on 2/6 with her daughter at bedside.  See prior notes for additional details  Assessment and Plan: Goals of Care I had discussion yesterday with daughter/son regarding plan of care and options including comfort measures.  At the time, we decided to liberalize medicines that could help with comfort  (dilaudid ), but to continue abx/diuretics (they were going to continue to discuss comfort with other family members - her mother's sister).  Palliative care was consulted and after additional discussion, Mrs. Anstey was transitioned to comfort measures.     Acute Hypoxic Respiratory Failure Suspect multifactorial, HF, AS, likely infectious  See below   Acute Heart Failure with preserved Ejection Fraction Severe Aortic Stenosis  Requiring 60 L HHFNC 100% FiO2 CXR 2/3 at presentation with increasing perihilar effusions, bilateral pleural effusions Echo with newly decreased EF, hypokinesis in lateral wall, mid/distal anterior wall, distal inferior wall, mid/distal septal wall and apex, severely elevated PASP Comfort measures as noted above   Fever  Pneumonia Now comfort measures   NSTEMI Troponin to 1325 Echo with wall motion abnormalities Comfort as noted above   Bilateral Pleural Effusion Small-moderate effusion noted on CXR -> echo mentioned large effusion   AKI   Acute Metabolic Encephalopathy Delirium Dementia Delirium in setting of dementia and hospitalization with resp failure   Hypokalemia   Hypothyroidism Synthroid     Depression Cymbalta        Procedures: see previous notes  Consultations: palliative care  The results of significant diagnostics from this hospitalization (including imaging, microbiology, ancillary and laboratory) are listed below for reference.   Significant Diagnostic Studies: ECHOCARDIOGRAM COMPLETE Result Date: 03/04/2023    ECHOCARDIOGRAM REPORT   Patient Name:   Krista Bowman Date of Exam: 03/04/2023 Medical Rec #:  [id number]      Height:       58.0 in Accession #:    [id number]     Weight:       116.8 lb Date of Birth:  02-Apr-1931  BSA:          1.449 m Patient Age:    89 years       BP:           128/53 mmHg Patient Gender: F              HR:           115 bpm. Exam Location:  Inpatient Procedure: 2D Echo, Cardiac Doppler and Color  Doppler Indications:    CHF-Acute Diastolic  History:        Patient has prior history of Echocardiogram examinations, most                 recent 04/17/2021. Arrythmias:Tachycardia; Risk                 Factors:Dyslipidemia.  Sonographer:    Ozell Free Referring Phys: [id number] DANIEL P GOODRICH  Sonographer Comments: Technically challenging study due to limited acoustic windows. Image acquisition challenging due to patient behavioral factors. and Image acquisition challenging due to respiratory motion. IMPRESSIONS  1. Hypokinesis in the lateral wall, mid/distal anterior wall, distal inferior wall, mid/distal septal wall and apex Compared to echo report from 04/17/21, LVEF is now depressed. . Left ventricular ejection fraction, by estimation, is 40%. There is mild left ventricular hypertrophy. Left ventricular diastolic parameters are indeterminate.  2. Right ventricular systolic function is normal. The right ventricular size is normal. There is severely elevated pulmonary artery systolic pressure.  3. Left atrial size was moderately dilated.  4. Large pleural effusion.  5. Moderate mitral valve regurgitation.  6. Tricuspid valve regurgitation is moderate.  7. AV is thickened, calcified Peak and mean gradients through the valve are 83 and 51 mm Hg respectively Note patient is in atrial fibrillation with variation in heart rhythm Overall consistent with severe AS . Compared to echo from 2023, mean gradient is increased (43 to 51 mm Hg) . Aortic valve regurgitation is mild. FINDINGS  Left Ventricle: Hypokinesis in the lateral wall, mid/distal anterior wall, distal inferior wall, mid/distal septal wall and apex Compared to echo report from 04/17/21, LVEF is now depressed. Left ventricular ejection fraction, by estimation, is 40%. The left ventricular internal cavity size was normal in size. There is mild left ventricular hypertrophy. Left ventricular diastolic parameters are indeterminate. Right Ventricle: The right  ventricular size is normal. Right vetricular wall thickness was not assessed. Right ventricular systolic function is normal. There is severely elevated pulmonary artery systolic pressure. The tricuspid regurgitant velocity is 3.98 m/s, and with an assumed right atrial pressure of 15 mmHg, the estimated right ventricular systolic pressure is 78.4 mmHg. Left Atrium: Left atrial size was moderately dilated. Right Atrium: Right atrial size was normal in size. Pericardium: There is no evidence of pericardial effusion. Mitral Valve: There is mild thickening of the mitral valve leaflet(s). Mild mitral annular calcification. Moderate mitral valve regurgitation. MV peak gradient, 7.6 mmHg. The mean mitral valve gradient is 4.0 mmHg. Tricuspid Valve: The tricuspid valve is grossly normal. Tricuspid valve regurgitation is moderate. Aortic Valve: AV is thickened, calcified Peak and mean gradients through the valve are 83 and 51 mm Hg respectively Note patient is in atrial fibrillation with variation in heart rhythm Overall consistent with severe AS . Compared to echo from 2023, mean  gradient is increased (43 to 51 mm Hg). Aortic valve regurgitation is mild. Aortic regurgitation PHT measures 180 msec. Aortic valve mean gradient measures 51.0 mmHg. Aortic valve peak gradient measures 83.2 mmHg. Aortic valve  area, by VTI measures 0.33  cm. Pulmonic Valve: The pulmonic valve was grossly normal. Pulmonic valve regurgitation is not visualized. Aorta: The aortic root and ascending aorta are structurally normal, with no evidence of dilitation. IAS/Shunts: No atrial level shunt detected by color flow Doppler. Additional Comments: There is Krista Bowman large pleural effusion.  LEFT VENTRICLE PLAX 2D LVIDd:         4.00 cm     Diastology LVIDs:         3.20 cm     LV e' medial:    8.59 cm/s LV PW:         1.10 cm     LV E/e' medial:  17.7 LV IVS:        1.20 cm     LV e' lateral:   9.03 cm/s LVOT diam:     1.80 cm     LV E/e' lateral: 16.8 LV SV:          28 LV SV Index:   19 LVOT Area:     2.54 cm  LV Volumes (MOD) LV vol d, MOD A2C: 86.5 ml LV vol d, MOD A4C: 69.6 ml LV vol s, MOD A2C: 46.7 ml LV vol s, MOD A4C: 42.2 ml LV SV MOD A2C:     39.8 ml LV SV MOD A4C:     69.6 ml LV SV MOD BP:      32.8 ml RIGHT VENTRICLE             IVC RV Basal diam:  3.90 cm     IVC diam: 2.00 cm RV S prime:     12.20 cm/s TAPSE (M-mode): 2.3 cm LEFT ATRIUM             Index        RIGHT ATRIUM           Index LA diam:        4.10 cm 2.83 cm/m   RA Area:     15.70 cm LA Vol (A2C):   71.7 ml 49.48 ml/m  RA Volume:   42.90 ml  29.60 ml/m LA Vol (A4C):   59.6 ml 41.13 ml/m LA Biplane Vol: 69.3 ml 47.82 ml/m  AORTIC VALVE AV Area (Vmax):    0.35 cm AV Area (Vmean):   0.33 cm AV Area (VTI):     0.33 cm AV Vmax:           456.00 cm/s AV Vmean:          341.000 cm/s AV VTI:            0.859 m AV Peak Grad:      83.2 mmHg AV Mean Grad:      51.0 mmHg LVOT Vmax:         63.10 cm/s LVOT Vmean:        43.700 cm/s LVOT VTI:          0.111 m LVOT/AV VTI ratio: 0.13 AI PHT:            180 msec  AORTA Ao Root diam: 3.00 cm MITRAL VALVE                TRICUSPID VALVE MV Area (PHT): 12.44 cm    TR Peak grad:   63.4 mmHg MV Area VTI:   1.67 cm     TR Vmax:        398.00 cm/s MV Peak grad:  7.6 mmHg MV Mean grad:  4.0 mmHg  SHUNTS MV Vmax:       1.38 m/s     Systemic VTI:  0.11 m MV Vmean:      98.7 cm/s    Systemic Diam: 1.80 cm MV Decel Time: 61 msec MR Peak grad: 144.0 mmHg MR Mean grad: 90.0 mmHg MR Vmax:      600.00 cm/s MR Vmean:     443.0 cm/s MV E velocity: 152.00 cm/s MV Krista Bowman velocity: 150.00 cm/s MV E/Shital Crayton ratio:  1.01 Krista Gull MD Electronically signed by Krista Gull MD Signature Date/Time: 03/04/2023/12:59:46 PM    Final    VAS US  LOWER EXTREMITY VENOUS (DVT) Result Date: 03/03/2023  Lower Venous DVT Study Patient Name:  Krista Bowman  Date of Exam:   03/03/2023 Medical Rec #: [id number]       Accession #:    [id number] Date of Birth: [birth year]       Patient Gender: F Patient  Age:   33 years Exam Location:  [facility] Procedure:      VAS US  LOWER EXTREMITY VENOUS (DVT) Referring Phys: TORIBIO DOOR --------------------------------------------------------------------------------  Indications: Edema.  Risk Factors: None identified. Limitations: Poor ultrasound/tissue interface and patient pain tolerance, patient positioning, patient movement, poor patient cooperation. Comparison Study: No prior studies. Performing Technologist: Cordella Collet RVT  Examination Guidelines: Nyilah Kight complete evaluation includes B-mode imaging, spectral Doppler, color Doppler, and power Doppler as needed of all accessible portions of each vessel. Bilateral testing is considered an integral part of Rogan Ecklund complete examination. Limited examinations for reoccurring indications may be performed as noted. The reflux portion of the exam is performed with the patient in reverse Trendelenburg.  +---------+---------------+---------+-----------+----------+-------------------+ RIGHT    CompressibilityPhasicitySpontaneityPropertiesThrombus Aging      +---------+---------------+---------+-----------+----------+-------------------+ CFV      Full           Yes      Yes                                      +---------+---------------+---------+-----------+----------+-------------------+ SFJ      Full                                                             +---------+---------------+---------+-----------+----------+-------------------+ FV Prox  Full                                                             +---------+---------------+---------+-----------+----------+-------------------+ FV Mid   Full                                                             +---------+---------------+---------+-----------+----------+-------------------+ FV DistalFull                                                              +---------+---------------+---------+-----------+----------+-------------------+  PFV      Full                                                             +---------+---------------+---------+-----------+----------+-------------------+ POP      Full           Yes      Yes                                      +---------+---------------+---------+-----------+----------+-------------------+ PTV                                                   Not well visualized +---------+---------------+---------+-----------+----------+-------------------+ PERO                                                  Not well visualized +---------+---------------+---------+-----------+----------+-------------------+   +---------+---------------+---------+-----------+----------+-------------------+ LEFT     CompressibilityPhasicitySpontaneityPropertiesThrombus Aging      +---------+---------------+---------+-----------+----------+-------------------+ CFV      Full           Yes      Yes                                      +---------+---------------+---------+-----------+----------+-------------------+ SFJ      Full                                                             +---------+---------------+---------+-----------+----------+-------------------+ FV Prox                 Yes      Yes                                      +---------+---------------+---------+-----------+----------+-------------------+ FV Mid   Full                                                             +---------+---------------+---------+-----------+----------+-------------------+ FV Distal               Yes      Yes                                      +---------+---------------+---------+-----------+----------+-------------------+ PFV      Full                                                             +---------+---------------+---------+-----------+----------+-------------------+  POP                     Yes      Yes                                      +---------+---------------+---------+-----------+----------+-------------------+ PTV                                                   Not well visualized +---------+---------------+---------+-----------+----------+-------------------+ PERO                                                  Not well visualized +---------+---------------+---------+-----------+----------+-------------------+     Summary: RIGHT: - There is no evidence of deep vein thrombosis in the lower extremity. However, portions of this examination were limited- see technologist comments above.  - No cystic structure found in the popliteal fossa.  LEFT: - There is no evidence of deep vein thrombosis in the lower extremity. However, portions of this examination were limited- see technologist comments above.  - No cystic structure found in the popliteal fossa.  *See table(s) above for measurements and observations. Electronically signed by Krista Bowman on 03/03/2023 at 1:58:02 PM.    Final    DG CHEST PORT 1 VIEW Result Date: 03/02/2023 CLINICAL DATA:  Hypoxia.  Shortness of breath. EXAM: PORTABLE CHEST 1 VIEW COMPARISON:  03/02/2023 FINDINGS: Patient positioning and rotation limits examination. Shallow inspiration. Moderate bilateral pleural effusions are likely unchanged given differences in patient positioning. Cardiac enlargement with perihilar infiltrates, increasing. No pneumothorax. Calcification of the aorta. IMPRESSION: Cardiac enlargement. Increasing perihilar effusions likely edema. Similar bilateral pleural effusions. Electronically Signed   By: Krista Bowman M.D.   On: 03/02/2023 19:18   DG Chest Port 1 View Result Date: 03/02/2023 CLINICAL DATA:  Dyspnea EXAM: PORTABLE CHEST 1 VIEW COMPARISON:  Chest x-ray 09/24/2022. CT abdomen and pelvis 05/10/2019 FINDINGS: The aorta is ectatic. The heart is enlarged. There central pulmonary vascular  congestion. Small pleural effusions are present. There is no pneumothorax or acute fracture. There severe atherosclerotic calcifications in the upper abdomen. IMPRESSION: Cardiomegaly with central pulmonary vascular congestion and small pleural effusions. Electronically Signed   By: Krista Bowman M.D.   On: 03/02/2023 15:25    Microbiology: Recent Results (from the past 240 hours)  Resp panel by RT-PCR (RSV, Flu Kryssa Risenhoover&B, Covid) Anterior Nasal Swab     Status: None   Collection Time: 03/02/23  1:55 PM   Specimen: Anterior Nasal Swab  Result Value Ref Range Status   SARS Coronavirus 2 by RT PCR NEGATIVE NEGATIVE Final    Comment: (NOTE) SARS-CoV-2 target nucleic acids are NOT DETECTED.  The SARS-CoV-2 RNA is generally detectable in upper respiratory specimens during the acute phase of infection. The lowest concentration of SARS-CoV-2 viral copies this assay can detect is 138 copies/mL. Wm Fruchter negative result does not preclude SARS-Cov-2 infection and should not be used as the sole basis for treatment or other patient management decisions. Melessia Kaus negative result may occur with  improper specimen collection/handling, submission of specimen other than nasopharyngeal swab, presence of viral mutation(s) within the areas targeted  by this assay, and inadequate number of viral copies(<138 copies/mL). Kraven Calk negative result must be combined with clinical observations, patient history, and epidemiological information. The expected result is Negative.  Fact Sheet for Patients:  bloggercourse.com  Fact Sheet for Healthcare Providers:  seriousbroker.it  This test is no t yet approved or cleared by the [place]  FDA and  has been authorized for detection and/or diagnosis of SARS-CoV-2 by FDA under an Emergency Use Authorization (EUA). This EUA will remain  in effect (meaning this test can be used) for the duration of the COVID-19 declaration under Section  564(b)(1) of the Act, 21 U.S.C.section 360bbb-3(b)(1), unless the authorization is terminated  or revoked sooner.       Influenza Kriston Mckinnie by PCR NEGATIVE NEGATIVE Final   Influenza B by PCR NEGATIVE NEGATIVE Final    Comment: (NOTE) The Xpert Xpress SARS-CoV-2/FLU/RSV plus assay is intended as an aid in the diagnosis of influenza from Nasopharyngeal swab specimens and should not be used as Clella Mckeel sole basis for treatment. Nasal washings and aspirates are unacceptable for Xpert Xpress SARS-CoV-2/FLU/RSV testing.  Fact Sheet for Patients: bloggercourse.com  Fact Sheet for Healthcare Providers: seriousbroker.it  This test is not yet approved or cleared by the [place]  FDA and has been authorized for detection and/or diagnosis of SARS-CoV-2 by FDA under an Emergency Use Authorization (EUA). This EUA will remain in effect (meaning this test can be used) for the duration of the COVID-19 declaration under Section 564(b)(1) of the Act, 21 U.S.C. section 360bbb-3(b)(1), unless the authorization is terminated or revoked.     Resp Syncytial Virus by PCR NEGATIVE NEGATIVE Final    Comment: (NOTE) Fact Sheet for Patients: bloggercourse.com  Fact Sheet for Healthcare Providers: seriousbroker.it  This test is not yet approved or cleared by the [place]  FDA and has been authorized for detection and/or diagnosis of SARS-CoV-2 by FDA under an Emergency Use Authorization (EUA). This EUA will remain in effect (meaning this test can be used) for the duration of the COVID-19 declaration under Section 564(b)(1) of the Act, 21 U.S.C. section 360bbb-3(b)(1), unless the authorization is terminated or revoked.  Performed at [facility], [address], [place], [place] [address]   Culture, blood (Routine X 2) w Reflex to ID Panel     Status: None (Preliminary result)    Collection Time: 03/03/23  1:27 AM   Specimen: BLOOD  Result Value Ref Range Status   Specimen Description   Final    BLOOD RIGHT ANTECUBITAL Performed at [facility], [address], [place], [place] [address]    Special Requests   Final    BOTTLES DRAWN AEROBIC AND ANAEROBIC Blood Culture adequate volume Performed at [facility], [address] [place], [place] [address]    Culture   Final    NO GROWTH 2 DAYS Performed at [facility], [address] [place], [place] [address]    Report Status PENDING  Incomplete  MRSA Next Gen by PCR, Nasal     Status: None   Collection Time: 03/03/23  2:26 AM   Specimen: Nasal Mucosa; Nasal Swab  Result Value Ref Range Status   MRSA by PCR Next Gen NOT DETECTED NOT DETECTED Final    Comment: (NOTE) The GeneXpert MRSA Assay (FDA approved for NASAL specimens only), is one component of Jobina Maita comprehensive MRSA colonization surveillance program. It is not intended to diagnose MRSA infection nor to guide or monitor treatment for MRSA infections. Test  performance is not FDA approved in patients less than 26 years old. Performed at [facility], [address], [place], [place] [address]   Culture, blood (Routine X 2) w Reflex to ID Panel     Status: None (Preliminary result)   Collection Time: 03/03/23  2:56 AM   Specimen: BLOOD RIGHT ARM  Result Value Ref Range Status   Specimen Description   Final    BLOOD RIGHT ARM Performed at [facility], [address] [place], [place] [address]    Special Requests   Final    BOTTLES DRAWN AEROBIC ONLY Blood Culture results may not be optimal due to an inadequate volume of blood received in culture bottles Performed at [facility], [address] [place], [place] [address]    Culture   Final    NO GROWTH 2 DAYS Performed at [facility], [address] [place], [place] [address]    Report  Status PENDING  Incomplete  Respiratory (~20 pathogens) panel by PCR     Status: None   Collection Time: 03/04/23  7:34 AM   Specimen: Nasopharyngeal Swab; Respiratory  Result Value Ref Range Status   Adenovirus NOT DETECTED NOT DETECTED Final   Coronavirus 229E NOT DETECTED NOT DETECTED Final    Comment: (NOTE) The Coronavirus on the Respiratory Panel, DOES NOT test for the novel  Coronavirus (2019 nCoV)    Coronavirus HKU1 NOT DETECTED NOT DETECTED Final   Coronavirus NL63 NOT DETECTED NOT DETECTED Final   Coronavirus OC43 NOT DETECTED NOT DETECTED Final   Metapneumovirus NOT DETECTED NOT DETECTED Final   Rhinovirus / Enterovirus NOT DETECTED NOT DETECTED Final   Influenza Daven Montz NOT DETECTED NOT DETECTED Final   Influenza B NOT DETECTED NOT DETECTED Final   Parainfluenza Virus 1 NOT DETECTED NOT DETECTED Final   Parainfluenza Virus 2 NOT DETECTED NOT DETECTED Final   Parainfluenza Virus 3 NOT DETECTED NOT DETECTED Final   Parainfluenza Virus 4 NOT DETECTED NOT DETECTED Final   Respiratory Syncytial Virus NOT DETECTED NOT DETECTED Final   Bordetella pertussis NOT DETECTED NOT DETECTED Final   Bordetella Parapertussis NOT DETECTED NOT DETECTED Final   Chlamydophila pneumoniae NOT DETECTED NOT DETECTED Final   Mycoplasma pneumoniae NOT DETECTED NOT DETECTED Final    Comment: Performed at [facility], [address], [place], [place] [address]    Time spent: 20 minutes  Signed: Meliton Monte, MD 30-Mar-2023

## 2023-03-28 NOTE — Plan of Care (Signed)
  Problem: Pain Managment: Goal: General experience of comfort will improve and/or be controlled Outcome: Progressing   Problem: Pain Management: Goal: Satisfaction with pain management regimen will improve Outcome: Progressing

## 2023-03-28 NOTE — Progress Notes (Signed)
 Daily Progress Note   Patient Name: Krista Bowman       Date: 04-04-2023 DOB: 05/24/1931  Age: 89 y.o. MRN#: [id number] Attending Physician: Perri DELENA Meliton Mickey., * Primary Care Physician: Sherlynn Madden, MD Admit Date: 03/02/2023 Length of Stay: 3 days  Reason for Consultation/Follow-up: Establishing goals of care  HPI/Patient Profile:  89 y.o. female  with past medical history of dementia, hypothyroidism, HLD admitted on 03/02/2023 with shortness of breath with concern for acute heart failure and pneumonia.   Subjective:   Subjective: Chart Reviewed. Updates received. Patient Assessed. Created space and opportunity for patient  and family to explore thoughts and feelings regarding current medical situation.  Today's Discussion: Today saw the patient at bedside, daughter Marval was present.  Diabetes good friend Devere was on the phone.  Toward the end of our visit the patient's chaplain stop by as well.  We spent some time talking about her current clinical situation.  We discussed that she has been transition to comfort care and we had Us  know what that entails.  We discussed that unfortunately she lost her IV and are unable to place a new one despite 2-3 attempts.  Because of this we transitioned her pain medication to sublingual Roxicodone  Intensol sublingual every 2 hours.  We also have various formulations of medication such as Robinul  and Ativan  that can be given sublingually or subcutaneously as well.  We discussed she currently is on 35 L of high flow nasal cannula.  This has been adjusted, as able, pending patient's adequate symptom management.  I shared that symptom control and comfort is always primary concern.  Right now the patient looks very comfortable, resting.  Daughter asked about oxygen titration and I shared that I would ask a nurse to turn the oxygen down.  We can provide medications if needed for any dyspnea develops.  The patient's daughter was asking how much time  her mom has.  We spent time talking about prognosis and her difficulty in accurately prognosticating.  However, I shared that once her oxygen supplementation comes down she will likely pass quickly, probably in hours to a day or 2.  We also talked about how the patient's daughter is coping.  We spent some time talking about grief and support systems.  I ensured that the patient had our contact information and I encouraged her to call for any support needed.  I shared that I would be here through Saturday.  I also encouraged her to request the nurse page the chaplain if she needs spiritual support ongoing.  I provided emotional and general support through therapeutic listening, empathy, sharing of stories, therapeutic touch, and other techniques. I answered all questions and addressed all concerns to the best of my ability.  Review of Systems  Unable to perform ROS: Patient unresponsive    Objective:   Vital Signs:  BP 130/71   Pulse (!) 109   Temp 98 F (36.7 C)   Resp 16   Ht 4' 10 (1.473 m)   Wt 53 kg   SpO2 100%   BMI 24.42 kg/m   Physical Exam Vitals and nursing note reviewed.  Constitutional:      General: She is sleeping. She is not in acute distress. HENT:     Head: Normocephalic and atraumatic.  Cardiovascular:     Rate and Rhythm: Normal rate.  Pulmonary:     Effort: No respiratory distress.     Comments: Respiratory pattern irregular, intermittently shallow; HFNC in place. Abdominal:  General: Abdomen is flat. Bowel sounds are normal.     Palpations: Abdomen is soft.  Skin:    General: Skin is warm and dry.  Neurological:     Mental Status: She is unresponsive.     Palliative Assessment/Data: 10%    Existing Vynca/ACP Documentation: MOST form signed 04/12/2015 Advance directive signed 01/27/2018  Assessment & Plan:   Impression: Present on Admission: **None**  SUMMARY OF RECOMMENDATIONS   DNR-comfort Continue comfort care See symptom management  orders below Continued emotional support of patient and family Encouraged family to request chaplain visit if needed Palliative medicine will continue to follow daily  Symptom Management: Adjusted due to loss of IV and unable to place another Tylenol  650 mg PR every 6 hours as needed mild pain Robinul  0.2 mg subcutaneous every 4 hours as needed excessive secretions Roxicodone  Intensol 5 mg sublingual every 2 hours Polyvinyl alcohol  1.4% ophthalmic 1 drop OU 4 times daily as needed dry eyes  Code Status: DNR-comfort  Prognosis: Hours - Days  Discharge Planning: Anticipated Hospital Death  Discussed with: Patient's family, medical team, nursing team  Thank you for allowing us  to participate in the care of Krista Bowman PMT will continue to support holistically.  Time Total: 39 min  Detailed review of medical records (labs, imaging, vital signs), medically appropriate exam, discussed with treatment team, counseling and education to patient, family, & staff, documenting clinical information, medication management, coordination of care  Camellia Kays, NP Palliative Medicine Team  Team Phone # [phone] (Nights/Weekends)  09/25/2020, 8:17 AM

## 2023-03-28 NOTE — Progress Notes (Signed)
 PROGRESS NOTE    Krista Bowman  FMW:[id number] DOB: July 26, 1931 DOA: 03/02/2023 PCP: Sherlynn Madden, MD  Chief Complaint  Patient presents with   Shortness of Breath    Pt saturating in the 80's on 2L nasal canula. Pt normally on room air.    Brief Narrative:   89 year old woman presented from memory care, PMH dementia presumably although not listed in her past medical history, with hypoxia. Started on BiPAP but could not tolerate. Consideration given to acute CHF. Placed on high flow nasal cannula. Admitted for hypoxia pulmonary edema.  Assessment & Plan:   Principal Problem:   Acute respiratory failure with hypoxia (HCC) Active Problems:   Severe aortic valve stenosis   Acute on chronic diastolic CHF (congestive heart failure) (HCC)   AKI (acute kidney injury) (HCC)   Dementia without behavioral disturbance (HCC)  Goals of Care I had discussion yesterday with daughter/son regarding plan of care and options including comfort measures.  At the time, we decided to liberalize medicines that could help with comfort (dilaudid ), but to continue abx/diuretics (they were going to continue to discuss comfort with other family members - her mother's sister).  Palliative care was consulted and after additional discussion, Krista Bowman was transitioned to comfort measures.    Acute Hypoxic Respiratory Failure Suspect multifactorial, HF, AS, likely infectious  See below  Acute Heart Failure with preserved Ejection Fraction Severe Aortic Stenosis  Requiring 60 L HHFNC 100% FiO2 CXR 2/3 at presentation with increasing perihilar effusions, bilateral pleural effusions Echo with newly decreased EF, hypokinesis in lateral wall, mid/distal anterior wall, distal inferior wall, mid/distal septal wall and apex, severely elevated PASP Comfort measures as noted above  Fever  Pneumonia Now comfort measures  NSTEMI Troponin to 1325 Echo with wall motion abnormalities Comfort as noted  above  Bilateral Pleural Effusion Small-moderate effusion noted on CXR -> echo mentioned large effusion  AKI  Acute Metabolic Encephalopathy Delirium Dementia Delirium in setting of dementia and hospitalization with resp failure  Hypokalemia  Hypothyroidism Synthroid    Depression Cymbalta     DVT prophylaxis: lovenox   Code Status: full Family Communication: daughter Disposition:   Status is: Inpatient Remains inpatient appropriate because: need for inpatient care   Consultants:  none  Procedures:  none  Antimicrobials:  Anti-infectives (From admission, onward)    Start     Dose/Rate Route Frequency Ordered Stop   03/02/23 1600  cefTRIAXone  (ROCEPHIN ) 1 g in sodium chloride  0.9 % 100 mL IVPB  Status:  Discontinued        1 g 200 mL/hr over 30 Minutes Intravenous Every 24 hours 03/02/23 1547 03/04/23 1609   03/02/23 1600  azithromycin  (ZITHROMAX ) 500 mg in sodium chloride  0.9 % 250 mL IVPB  Status:  Discontinued        500 mg 250 mL/hr over 60 Minutes Intravenous Every 24 hours 03/02/23 1547 03/04/23 1609       Subjective: No family at bedside unresponsive  Objective: Vitals:   03-19-23 0000 03-19-2023 0159 03/19/23 0200 03/19/2023 0400  BP:      Pulse: 95 (!) 104 (!) 107 (!) 109  Resp: (!) 7 (!) 8 17 16   Temp:      TempSrc:      SpO2: 100% 100% 100% 100%  Weight:      Height:        Intake/Output Summary (Last 24 hours) at 03/19/23 0833 Last data filed at 03/19/2023 0600 Gross per 24 hour  Intake 1008.01 ml  Output 200 ml  Net 808.01 ml   Filed Weights   03/03/23 0020 03/03/23 0232 03/04/23 0444  Weight: 53.5 kg 53.2 kg 53 kg    Examination:  Limited exam with focus on comfort  General: appears comfortable on morphine  gtt Lungs: slow unlabored breathing Neurological: unresponsive on morphine  gtt Skin: Warm and dry.   Data Reviewed: I have personally reviewed following labs and imaging studies  CBC: Recent Labs  Lab 03/02/23 1250  03/03/23 0127 03/03/23 0256 03/04/23 0257  WBC 11.7* 13.6* 11.2* 11.2*  HGB 11.4* 10.0* 11.2* 9.5*  HCT 36.7 31.4* 35.1* 30.5*  MCV 98.9 94.9 93.9 96.8  PLT 371 335 264 242    Basic Metabolic Panel: Recent Labs  Lab 03/02/23 1250 03/03/23 0127 03/03/23 0256 03/04/23 0257  NA 138  --  139 140  K 3.7  --  3.9 3.0*  CL 106  --  105 104  CO2 20*  --  22 24  GLUCOSE 127*  --  164* 118*  BUN 30*  --  33* 30*  CREATININE 0.91 1.11* 1.31* 1.09*  CALCIUM  8.5*  --  8.0* 7.8*    GFR: Estimated Creatinine Clearance: 25.2 mL/min (Krista Bowman) (by C-G formula based on SCr of 1.09 mg/dL (H)).  Liver Function Tests: Recent Labs  Lab 03/02/23 1250  AST 32  ALT 23  ALKPHOS 71  BILITOT 0.5  PROT 6.6  ALBUMIN  3.4*    CBG: Recent Labs  Lab 03/03/23 2008 03/04/23 0138 03/04/23 0407 03/04/23 0802 03/04/23 1150  GLUCAP 181* 165* 130* 175* 167*     Recent Results (from the past 240 hours)  Resp panel by RT-PCR (RSV, Flu Krista Bowman&B, Covid) Anterior Nasal Swab     Status: None   Collection Time: 03/02/23  1:55 PM   Specimen: Anterior Nasal Swab  Result Value Ref Range Status   SARS Coronavirus 2 by RT PCR NEGATIVE NEGATIVE Final    Comment: (NOTE) SARS-CoV-2 target nucleic acids are NOT DETECTED.  The SARS-CoV-2 RNA is generally detectable in upper respiratory specimens during the acute phase of infection. The lowest concentration of SARS-CoV-2 viral copies this assay can detect is 138 copies/mL. Krista Bowman negative result does not preclude SARS-Cov-2 infection and should not be used as the sole basis for treatment or other patient management decisions. Krista Bowman negative result may occur with  improper specimen collection/handling, submission of specimen other than nasopharyngeal swab, presence of viral mutation(s) within the areas targeted by this assay, and inadequate number of viral copies(<138 copies/mL). Krista Bowman negative result must be combined with clinical observations, patient history, and  epidemiological information. The expected result is Negative.  Fact Sheet for Patients:  bloggercourse.com  Fact Sheet for Healthcare Providers:  seriousbroker.it  This test is no t yet approved or cleared by the [place]  FDA and  has been authorized for detection and/or diagnosis of SARS-CoV-2 by FDA under an Emergency Use Authorization (EUA). This EUA will remain  in effect (meaning this test can be used) for the duration of the COVID-19 declaration under Section 564(b)(1) of the Act, 21 U.S.C.section 360bbb-3(b)(1), unless the authorization is terminated  or revoked sooner.       Influenza Vala Raffo by PCR NEGATIVE NEGATIVE Final   Influenza B by PCR NEGATIVE NEGATIVE Final    Comment: (NOTE) The Xpert Xpress SARS-CoV-2/FLU/RSV plus assay is intended as an aid in the diagnosis of influenza from Nasopharyngeal swab specimens and should not be used as Minal Stuller sole basis for treatment. Nasal washings and aspirates are unacceptable for Xpert  Xpress SARS-CoV-2/FLU/RSV testing.  Fact Sheet for Patients: bloggercourse.com  Fact Sheet for Healthcare Providers: seriousbroker.it  This test is not yet approved or cleared by the [place]  FDA and has been authorized for detection and/or diagnosis of SARS-CoV-2 by FDA under an Emergency Use Authorization (EUA). This EUA will remain in effect (meaning this test can be used) for the duration of the COVID-19 declaration under Section 564(b)(1) of the Act, 21 U.S.C. section 360bbb-3(b)(1), unless the authorization is terminated or revoked.     Resp Syncytial Virus by PCR NEGATIVE NEGATIVE Final    Comment: (NOTE) Fact Sheet for Patients: bloggercourse.com  Fact Sheet for Healthcare Providers: seriousbroker.it  This test is not yet approved or cleared by the [place]  FDA and has been  authorized for detection and/or diagnosis of SARS-CoV-2 by FDA under an Emergency Use Authorization (EUA). This EUA will remain in effect (meaning this test can be used) for the duration of the COVID-19 declaration under Section 564(b)(1) of the Act, 21 U.S.C. section 360bbb-3(b)(1), unless the authorization is terminated or revoked.  Performed at [facility], [address] [place], [place] [address]   Culture, blood (Routine X 2) w Reflex to ID Panel     Status: None (Preliminary result)   Collection Time: 03/03/23  1:27 AM   Specimen: BLOOD  Result Value Ref Range Status   Specimen Description   Final    BLOOD RIGHT ANTECUBITAL Performed at [facility], [address] [place], [place] [address]    Special Requests   Final    BOTTLES DRAWN AEROBIC AND ANAEROBIC Blood Culture adequate volume Performed at [facility], [address] [place], [place] [address]    Culture   Final    NO GROWTH 2 DAYS Performed at [facility], [address] [place], [place] [address]    Report Status PENDING  Incomplete  MRSA Next Gen by PCR, Nasal     Status: None   Collection Time: 03/03/23  2:26 AM   Specimen: Nasal Mucosa; Nasal Swab  Result Value Ref Range Status   MRSA by PCR Next Gen NOT DETECTED NOT DETECTED Final    Comment: (NOTE) The GeneXpert MRSA Assay (FDA approved for NASAL specimens only), is one component of Ettore Trebilcock comprehensive MRSA colonization surveillance program. It is not intended to diagnose MRSA infection nor to guide or monitor treatment for MRSA infections. Test performance is not FDA approved in patients less than 39 years old. Performed at [facility], [address], [place], [place] [address]   Culture, blood (Routine X 2) w Reflex to ID Panel     Status: None (Preliminary result)   Collection Time: 03/03/23  2:56 AM   Specimen: BLOOD RIGHT ARM  Result Value Ref Range Status    Specimen Description   Final    BLOOD RIGHT ARM Performed at [facility], [address], [place], [place] [address]    Special Requests   Final    BOTTLES DRAWN AEROBIC ONLY Blood Culture results may not be optimal due to an inadequate volume of blood received in culture bottles Performed at [facility], [address] [place], [place] [address]    Culture   Final    NO GROWTH 2 DAYS Performed at [facility], [address], [place], [place] [address]    Report Status PENDING  Incomplete  Respiratory (~20 pathogens) panel by PCR  Status: None   Collection Time: 03/04/23  7:34 AM   Specimen: Nasopharyngeal Swab; Respiratory  Result Value Ref Range Status   Adenovirus NOT DETECTED NOT DETECTED Final   Coronavirus 229E NOT DETECTED NOT DETECTED Final    Comment: (NOTE) The Coronavirus on the Respiratory Panel, DOES NOT test for the novel  Coronavirus (2019 nCoV)    Coronavirus HKU1 NOT DETECTED NOT DETECTED Final   Coronavirus NL63 NOT DETECTED NOT DETECTED Final   Coronavirus OC43 NOT DETECTED NOT DETECTED Final   Metapneumovirus NOT DETECTED NOT DETECTED Final   Rhinovirus / Enterovirus NOT DETECTED NOT DETECTED Final   Influenza Emon Miggins NOT DETECTED NOT DETECTED Final   Influenza B NOT DETECTED NOT DETECTED Final   Parainfluenza Virus 1 NOT DETECTED NOT DETECTED Final   Parainfluenza Virus 2 NOT DETECTED NOT DETECTED Final   Parainfluenza Virus 3 NOT DETECTED NOT DETECTED Final   Parainfluenza Virus 4 NOT DETECTED NOT DETECTED Final   Respiratory Syncytial Virus NOT DETECTED NOT DETECTED Final   Bordetella pertussis NOT DETECTED NOT DETECTED Final   Bordetella Parapertussis NOT DETECTED NOT DETECTED Final   Chlamydophila pneumoniae NOT DETECTED NOT DETECTED Final   Mycoplasma pneumoniae NOT DETECTED NOT DETECTED Final    Comment: Performed at [facility], [address] [place], [place] [address]         Radiology  Studies: ECHOCARDIOGRAM COMPLETE Result Date: 03/04/2023    ECHOCARDIOGRAM REPORT   Patient Name:   LOUNA ROTHGEB Date of Exam: 03/04/2023 Medical Rec #:  [id number]      Height:       58.0 in Accession #:    [id number]     Weight:       116.8 lb Date of Birth:  1931/12/27      BSA:          1.449 m Patient Age:    89 years       BP:           128/53 mmHg Patient Gender: F              HR:           115 bpm. Exam Location:  Inpatient Procedure: 2D Echo, Cardiac Doppler and Color Doppler Indications:    CHF-Acute Diastolic  History:        Patient has prior history of Echocardiogram examinations, most                 recent 04/17/2021. Arrythmias:Tachycardia; Risk                 Factors:Dyslipidemia.  Sonographer:    Ozell Free Referring Phys: [id number] DANIEL P GOODRICH  Sonographer Comments: Technically challenging study due to limited acoustic windows. Image acquisition challenging due to patient behavioral factors. and Image acquisition challenging due to respiratory motion. IMPRESSIONS  1. Hypokinesis in the lateral wall, mid/distal anterior wall, distal inferior wall, mid/distal septal wall and apex Compared to echo report from 04/17/21, LVEF is now depressed. . Left ventricular ejection fraction, by estimation, is 40%. There is mild left ventricular hypertrophy. Left ventricular diastolic parameters are indeterminate.  2. Right ventricular systolic function is normal. The right ventricular size is normal. There is severely elevated pulmonary artery systolic pressure.  3. Left atrial size was moderately dilated.  4. Large pleural effusion.  5. Moderate mitral valve regurgitation.  6. Tricuspid valve regurgitation is moderate.  7. AV is thickened, calcified Peak and mean gradients through the valve are 83  and 51 mm Hg respectively Note patient is in atrial fibrillation with variation in heart rhythm Overall consistent with severe AS . Compared to echo from 2023, mean gradient is increased (43 to 51 mm Hg) . Aortic  valve regurgitation is mild. FINDINGS  Left Ventricle: Hypokinesis in the lateral wall, mid/distal anterior wall, distal inferior wall, mid/distal septal wall and apex Compared to echo report from 04/17/21, LVEF is now depressed. Left ventricular ejection fraction, by estimation, is 40%. The left ventricular internal cavity size was normal in size. There is mild left ventricular hypertrophy. Left ventricular diastolic parameters are indeterminate. Right Ventricle: The right ventricular size is normal. Right vetricular wall thickness was not assessed. Right ventricular systolic function is normal. There is severely elevated pulmonary artery systolic pressure. The tricuspid regurgitant velocity is 3.98 m/s, and with an assumed right atrial pressure of 15 mmHg, the estimated right ventricular systolic pressure is 78.4 mmHg. Left Atrium: Left atrial size was moderately dilated. Right Atrium: Right atrial size was normal in size. Pericardium: There is no evidence of pericardial effusion. Mitral Valve: There is mild thickening of the mitral valve leaflet(s). Mild mitral annular calcification. Moderate mitral valve regurgitation. MV peak gradient, 7.6 mmHg. The mean mitral valve gradient is 4.0 mmHg. Tricuspid Valve: The tricuspid valve is grossly normal. Tricuspid valve regurgitation is moderate. Aortic Valve: AV is thickened, calcified Peak and mean gradients through the valve are 83 and 51 mm Hg respectively Note patient is in atrial fibrillation with variation in heart rhythm Overall consistent with severe AS . Compared to echo from 2023, mean  gradient is increased (43 to 51 mm Hg). Aortic valve regurgitation is mild. Aortic regurgitation PHT measures 180 msec. Aortic valve mean gradient measures 51.0 mmHg. Aortic valve peak gradient measures 83.2 mmHg. Aortic valve area, by VTI measures 0.33  cm. Pulmonic Valve: The pulmonic valve was grossly normal. Pulmonic valve regurgitation is not visualized. Aorta: The aortic  root and ascending aorta are structurally normal, with no evidence of dilitation. IAS/Shunts: No atrial level shunt detected by color flow Doppler. Additional Comments: There is Nychelle Cassata large pleural effusion.  LEFT VENTRICLE PLAX 2D LVIDd:         4.00 cm     Diastology LVIDs:         3.20 cm     LV e' medial:    8.59 cm/s LV PW:         1.10 cm     LV E/e' medial:  17.7 LV IVS:        1.20 cm     LV e' lateral:   9.03 cm/s LVOT diam:     1.80 cm     LV E/e' lateral: 16.8 LV SV:         28 LV SV Index:   19 LVOT Area:     2.54 cm  LV Volumes (MOD) LV vol d, MOD A2C: 86.5 ml LV vol d, MOD A4C: 69.6 ml LV vol s, MOD A2C: 46.7 ml LV vol s, MOD A4C: 42.2 ml LV SV MOD A2C:     39.8 ml LV SV MOD A4C:     69.6 ml LV SV MOD BP:      32.8 ml RIGHT VENTRICLE             IVC RV Basal diam:  3.90 cm     IVC diam: 2.00 cm RV S prime:     12.20 cm/s TAPSE (M-mode): 2.3 cm LEFT ATRIUM  Index        RIGHT ATRIUM           Index LA diam:        4.10 cm 2.83 cm/m   RA Area:     15.70 cm LA Vol (A2C):   71.7 ml 49.48 ml/m  RA Volume:   42.90 ml  29.60 ml/m LA Vol (A4C):   59.6 ml 41.13 ml/m LA Biplane Vol: 69.3 ml 47.82 ml/m  AORTIC VALVE AV Area (Vmax):    0.35 cm AV Area (Vmean):   0.33 cm AV Area (VTI):     0.33 cm AV Vmax:           456.00 cm/s AV Vmean:          341.000 cm/s AV VTI:            0.859 m AV Peak Grad:      83.2 mmHg AV Mean Grad:      51.0 mmHg LVOT Vmax:         63.10 cm/s LVOT Vmean:        43.700 cm/s LVOT VTI:          0.111 m LVOT/AV VTI ratio: 0.13 AI PHT:            180 msec  AORTA Ao Root diam: 3.00 cm MITRAL VALVE                TRICUSPID VALVE MV Area (PHT): 12.44 cm    TR Peak grad:   63.4 mmHg MV Area VTI:   1.67 cm     TR Vmax:        398.00 cm/s MV Peak grad:  7.6 mmHg MV Mean grad:  4.0 mmHg     SHUNTS MV Vmax:       1.38 m/s     Systemic VTI:  0.11 m MV Vmean:      98.7 cm/s    Systemic Diam: 1.80 cm MV Decel Time: 61 msec MR Peak grad: 144.0 mmHg MR Mean grad: 90.0 mmHg MR Vmax:       600.00 cm/s MR Vmean:     443.0 cm/s MV E velocity: 152.00 cm/s MV Elyn Krogh velocity: 150.00 cm/s MV E/Franciscojavier Wronski ratio:  1.01 Vina Gull MD Electronically signed by Vina Gull MD Signature Date/Time: 03/04/2023/12:59:46 PM    Final    VAS US  LOWER EXTREMITY VENOUS (DVT) Result Date: 03/03/2023  Lower Venous DVT Study Patient Name:  ATLANTA PELTO  Date of Exam:   03/03/2023 Medical Rec #: [id number]       Accession #:    [id number] Date of Birth: Jul 15, 1931       Patient Gender: F Patient Age:   21 years Exam Location:  [facility] Procedure:      VAS US  LOWER EXTREMITY VENOUS (DVT) Referring Phys: TORIBIO DOOR --------------------------------------------------------------------------------  Indications: Edema.  Risk Factors: None identified. Limitations: Poor ultrasound/tissue interface and patient pain tolerance, patient positioning, patient movement, poor patient cooperation. Comparison Study: No prior studies. Performing Technologist: Cordella Collet RVT  Examination Guidelines: Kamerin Axford complete evaluation includes B-mode imaging, spectral Doppler, color Doppler, and power Doppler as needed of all accessible portions of each vessel. Bilateral testing is considered an integral part of Clarissa Laird complete examination. Limited examinations for reoccurring indications may be performed as noted. The reflux portion of the exam is performed with the patient in reverse Trendelenburg.  +---------+---------------+---------+-----------+----------+-------------------+ RIGHT    CompressibilityPhasicitySpontaneityPropertiesThrombus Aging      +---------+---------------+---------+-----------+----------+-------------------+ CFV  Full           Yes      Yes                                      +---------+---------------+---------+-----------+----------+-------------------+ SFJ      Full                                                              +---------+---------------+---------+-----------+----------+-------------------+ FV Prox  Full                                                             +---------+---------------+---------+-----------+----------+-------------------+ FV Mid   Full                                                             +---------+---------------+---------+-----------+----------+-------------------+ FV DistalFull                                                             +---------+---------------+---------+-----------+----------+-------------------+ PFV      Full                                                             +---------+---------------+---------+-----------+----------+-------------------+ POP      Full           Yes      Yes                                      +---------+---------------+---------+-----------+----------+-------------------+ PTV                                                   Not well visualized +---------+---------------+---------+-----------+----------+-------------------+ PERO                                                  Not well visualized +---------+---------------+---------+-----------+----------+-------------------+   +---------+---------------+---------+-----------+----------+-------------------+ LEFT     CompressibilityPhasicitySpontaneityPropertiesThrombus Aging      +---------+---------------+---------+-----------+----------+-------------------+ CFV      Full           Yes      Yes                                      +---------+---------------+---------+-----------+----------+-------------------+  SFJ      Full                                                             +---------+---------------+---------+-----------+----------+-------------------+ FV Prox                 Yes      Yes                                      +---------+---------------+---------+-----------+----------+-------------------+ FV  Mid   Full                                                             +---------+---------------+---------+-----------+----------+-------------------+ FV Distal               Yes      Yes                                      +---------+---------------+---------+-----------+----------+-------------------+ PFV      Full                                                             +---------+---------------+---------+-----------+----------+-------------------+ POP                     Yes      Yes                                      +---------+---------------+---------+-----------+----------+-------------------+ PTV                                                   Not well visualized +---------+---------------+---------+-----------+----------+-------------------+ PERO                                                  Not well visualized +---------+---------------+---------+-----------+----------+-------------------+     Summary: RIGHT: - There is no evidence of deep vein thrombosis in the lower extremity. However, portions of this examination were limited- see technologist comments above.  - No cystic structure found in the popliteal fossa.  LEFT: - There is no evidence of deep vein thrombosis in the lower extremity. However, portions of this examination were limited- see technologist comments above.  - No cystic structure found in the popliteal fossa.  *See table(s) above for measurements and observations. Electronically signed by Norman Serve on 03/03/2023 at 1:58:02 PM.    Final  Scheduled Meds:  Chlorhexidine  Gluconate Cloth  6 each Topical Daily   mouth rinse  15 mL Mouth Rinse 4 times per day   sodium chloride  flush  3 mL Intravenous Q12H   zinc  oxide   Topical BID   Continuous Infusions:  morphine  4 mg/hr (03-27-23 0600)     LOS: 3 days    Time spent: 30 min    Meliton Monte, MD [facility]   To contact the attending provider  between 7A-7P or the covering provider during after hours 7P-7A, please log into the web site [email or web address] and access using universal [place] password for that web site. If you do not have the password, please call the hospital operator.  03-27-23, 8:33 AM

## 2023-03-28 DEATH — deceased
